# Patient Record
Sex: Female | Born: 1955
Health system: Southern US, Community
[De-identification: ages and names within clinical notes are randomized; demographics above are authoritative.]

## PROBLEM LIST (undated history)

## (undated) DIAGNOSIS — K219 Gastro-esophageal reflux disease without esophagitis: Secondary | ICD-10-CM

## (undated) DIAGNOSIS — K5792 Diverticulitis of intestine, part unspecified, without perforation or abscess without bleeding: Secondary | ICD-10-CM

## (undated) DIAGNOSIS — K760 Fatty (change of) liver, not elsewhere classified: Secondary | ICD-10-CM

## (undated) DIAGNOSIS — F419 Anxiety disorder, unspecified: Secondary | ICD-10-CM

## (undated) DIAGNOSIS — Z9889 Other specified postprocedural states: Secondary | ICD-10-CM

## (undated) DIAGNOSIS — R112 Nausea with vomiting, unspecified: Secondary | ICD-10-CM

## (undated) DIAGNOSIS — K5790 Diverticulosis of intestine, part unspecified, without perforation or abscess without bleeding: Secondary | ICD-10-CM

## (undated) DIAGNOSIS — M19042 Primary osteoarthritis, left hand: Secondary | ICD-10-CM

## (undated) DIAGNOSIS — M7060 Trochanteric bursitis, unspecified hip: Secondary | ICD-10-CM

## (undated) DIAGNOSIS — E785 Hyperlipidemia, unspecified: Secondary | ICD-10-CM

## (undated) DIAGNOSIS — G40909 Epilepsy, unspecified, not intractable, without status epilepticus: Secondary | ICD-10-CM

## (undated) DIAGNOSIS — G4733 Obstructive sleep apnea (adult) (pediatric): Principal | ICD-10-CM

## (undated) DIAGNOSIS — E669 Obesity, unspecified: Secondary | ICD-10-CM

## (undated) DIAGNOSIS — J301 Allergic rhinitis due to pollen: Secondary | ICD-10-CM

## (undated) DIAGNOSIS — M25472 Effusion, left ankle: Principal | ICD-10-CM

## (undated) DIAGNOSIS — M19041 Primary osteoarthritis, right hand: Secondary | ICD-10-CM

## (undated) DIAGNOSIS — K589 Irritable bowel syndrome without diarrhea: Secondary | ICD-10-CM

## (undated) HISTORY — DX: Gastro-esophageal reflux disease without esophagitis: K21.9

## (undated) HISTORY — PX: SHOULDER SURGERY: SHX246

## (undated) HISTORY — DX: Allergic rhinitis due to pollen: J30.1

## (undated) HISTORY — PX: BREAST ENHANCEMENT SURGERY: SHX7

## (undated) HISTORY — PX: BREAST BIOPSY: SHX20

## (undated) HISTORY — PX: CHOLECYSTECTOMY: SHX55

## (undated) HISTORY — DX: Diverticulosis of intestine, part unspecified, without perforation or abscess without bleeding: K57.90

## (undated) HISTORY — DX: Anxiety disorder, unspecified: F41.9

## (undated) HISTORY — DX: Obesity, unspecified: E66.9

## (undated) HISTORY — PX: CARPAL TUNNEL RELEASE: SHX101

## (undated) HISTORY — PX: AUGMENTATION MAMMAPLASTY: SUR837

## (undated) HISTORY — DX: Hyperlipidemia, unspecified: E78.5

## (undated) HISTORY — DX: Primary osteoarthritis, right hand: M19.041

## (undated) HISTORY — DX: Irritable bowel syndrome without diarrhea: K58.9

## (undated) HISTORY — DX: Primary osteoarthritis, left hand: M19.042

## (undated) HISTORY — PX: TONSILLECTOMY AND ADENOIDECTOMY: SUR1326

## (undated) HISTORY — DX: Epilepsy, unspecified, not intractable, without status epilepticus: G40.909

## (undated) HISTORY — DX: Obstructive sleep apnea (adult) (pediatric): G47.33

## (undated) HISTORY — DX: Diverticulitis of intestine, part unspecified, without perforation or abscess without bleeding: K57.92

## (undated) HISTORY — PX: CATARACT EXTRACTION: SUR2

## (undated) HISTORY — DX: Effusion, left ankle: M25.472

## (undated) HISTORY — DX: Fatty (change of) liver, not elsewhere classified: K76.0

## (undated) HISTORY — DX: Trochanteric bursitis, unspecified hip: M70.60

## (undated) HISTORY — PX: BACK SURGERY: SHX140

## (undated) HISTORY — PX: HAND SURGERY: SHX662

---

## 2007-05-30 ENCOUNTER — Emergency Department (HOSPITAL_COMMUNITY): Admission: EM | Admit: 2007-05-30 | Discharge: 2007-05-30 | Payer: Self-pay | Admitting: Emergency Medicine

## 2007-11-27 ENCOUNTER — Encounter: Admission: RE | Admit: 2007-11-27 | Discharge: 2007-11-27 | Payer: Self-pay | Admitting: Family Medicine

## 2009-07-29 ENCOUNTER — Encounter: Admission: RE | Admit: 2009-07-29 | Discharge: 2009-07-29 | Payer: Self-pay | Admitting: Gastroenterology

## 2009-08-18 ENCOUNTER — Ambulatory Visit (HOSPITAL_COMMUNITY): Admission: RE | Admit: 2009-08-18 | Discharge: 2009-08-18 | Payer: Self-pay | Admitting: Gastroenterology

## 2010-07-20 HISTORY — PX: CARDIAC CATHETERIZATION: SHX172

## 2010-08-02 ENCOUNTER — Encounter: Payer: Self-pay | Admitting: Family Medicine

## 2010-08-02 ENCOUNTER — Ambulatory Visit (INDEPENDENT_AMBULATORY_CARE_PROVIDER_SITE_OTHER): Payer: BC Managed Care – PPO | Admitting: Family Medicine

## 2010-08-02 DIAGNOSIS — K3184 Gastroparesis: Secondary | ICD-10-CM

## 2010-08-02 DIAGNOSIS — J309 Allergic rhinitis, unspecified: Secondary | ICD-10-CM

## 2010-08-02 DIAGNOSIS — J45909 Unspecified asthma, uncomplicated: Secondary | ICD-10-CM

## 2010-08-02 DIAGNOSIS — E785 Hyperlipidemia, unspecified: Secondary | ICD-10-CM

## 2010-08-02 MED ORDER — ALBUTEROL SULFATE HFA 108 (90 BASE) MCG/ACT IN AERS: 2.0000 | INHALATION_SPRAY | RESPIRATORY_TRACT | Status: DC | PRN

## 2010-08-02 MED ORDER — BECLOMETHASONE DIPROPIONATE 40 MCG/ACT IN AERS: 2.0000 | INHALATION_SPRAY | Freq: Two times a day (BID) | RESPIRATORY_TRACT | Status: DC

## 2010-08-02 MED ORDER — FLUTICASONE PROPIONATE 50 MCG/ACT NA SUSP: 2.0000 | Freq: Every day | NASAL | Status: DC

## 2010-08-02 NOTE — Patient Instructions (Signed)
Schedule your complete physical in 3-6 months- do not eat before this appt Start the Qvar daily as your controller medicine Use the Albuterol inhaler as your rescue medicine for when you have shortness of breath, cough, wheezing, or chest tightness Start the Flonase daily to control your nasal allergies and post nasal drip Call with any questions or concerns Welcome!  We're glad to have you!

## 2010-08-02 NOTE — Progress Notes (Signed)
  Subjective:    Patient ID: Christie Thomas, female    DOB: 08-Jan-1956, 55 y.o.   MRN: [id number]  HPI New to establish care.  Previous MD- Raquel James.  GYN- [facility])  Hyperlipidemia- chronic problem for pt, on Crestor.  Had recent clean heart cath w/ Dr Larey Brick.  Tolerating statin w/o abd pain, N/V, myalgias.  Gastroparesis- following w/ Dr Loreta Ave, had confirmatory gastic emptying study.  Intolerant of Reglan, on Domperidone 10mg  qid.  [facility]- [facility] but has not [facility].  Has been off Advair for 18 months, not using Proventil regularly.  Has had increased shortness of breath which is what prompted the cath.  Allergic rhinitis- on Allegra daily and Benadryl nightly.  No longer using Flonase.  Feels that allergy sxs are fairly well controlled.   Review of Systems For ROS see HPI     Objective:   Physical Exam  Constitutional: She is oriented to person, place, and time. She appears well-developed and well-nourished. No distress.       overwt  HENT:  Head: Normocephalic and atraumatic.       + edematous turbinates w/ mild congestion + PND No sinus tenderness  Eyes: Conjunctivae and EOM are normal. Pupils are equal, round, and reactive to light.  Neck: Normal range of motion. Neck supple. No thyromegaly present.  Cardiovascular: Normal rate, regular rhythm, normal heart sounds and intact distal pulses.   Pulmonary/Chest: Effort normal and breath sounds normal. No respiratory distress. She has no wheezes. She has no rales.  Abdominal: Soft. Bowel sounds are normal. She exhibits no distension. There is no tenderness.  Musculoskeletal: She exhibits no edema.  Lymphadenopathy:    She has no cervical adenopathy.  Neurological: She is alert and oriented to person, place, and time. No cranial nerve deficit. Coordination normal.  Skin: Skin is warm and dry.  Psychiatric: She has a normal mood and affect. Her behavior is normal.          Assessment  & Plan:

## 2010-08-10 DIAGNOSIS — E785 Hyperlipidemia, unspecified: Secondary | ICD-10-CM | POA: Insufficient documentation

## 2010-08-10 DIAGNOSIS — J45909 Unspecified asthma, uncomplicated: Secondary | ICD-10-CM | POA: Insufficient documentation

## 2010-08-10 DIAGNOSIS — K3184 Gastroparesis: Secondary | ICD-10-CM | POA: Insufficient documentation

## 2010-08-10 DIAGNOSIS — J309 Allergic rhinitis, unspecified: Secondary | ICD-10-CM | POA: Insufficient documentation

## 2010-08-10 NOTE — Assessment & Plan Note (Signed)
This is likely the cause of pt's SOB and what prompted her recent cath.  sxs are likely worse due to untreated allergies.  Start Qvar as controller med and albuterol prn for rescue.  Reviewed supportive care and red flags that should prompt return.  Pt expressed understanding and is in agreement w/ plan.

## 2010-08-10 NOTE — Assessment & Plan Note (Signed)
Start oral antihistamine daily and nasal steroid spray regularly.  Explained asthma/allergy relationship- pt now aware and understands.  Will follow.

## 2010-08-10 NOTE — Assessment & Plan Note (Signed)
On crestor daily.  Tolerating statin w/out difficulty.  Labs upcoming.  Will make adjustments to meds at that time.

## 2010-08-10 NOTE — Assessment & Plan Note (Signed)
On meds, following w/ Dr Loreta Ave.  Will follow and assist as able.

## 2010-09-13 ENCOUNTER — Other Ambulatory Visit: Payer: Self-pay | Admitting: Family Medicine

## 2010-09-13 NOTE — Telephone Encounter (Signed)
Refill sent.

## 2010-09-13 NOTE — Telephone Encounter (Signed)
Ok to refill for 3 months on both

## 2010-09-13 NOTE — Telephone Encounter (Signed)
No refills of these meds in [facility]. Please advise.

## 2011-01-11 ENCOUNTER — Ambulatory Visit (INDEPENDENT_AMBULATORY_CARE_PROVIDER_SITE_OTHER): Payer: BC Managed Care – PPO | Admitting: Family Medicine

## 2011-01-11 ENCOUNTER — Encounter: Payer: Self-pay | Admitting: Family Medicine

## 2011-01-11 VITALS — BP 118/62 | HR 81 | Temp 98.1°F | Ht 64.0 in | Wt 206.0 lb

## 2011-01-11 DIAGNOSIS — F341 Dysthymic disorder: Secondary | ICD-10-CM

## 2011-01-11 DIAGNOSIS — L509 Urticaria, unspecified: Secondary | ICD-10-CM

## 2011-01-11 DIAGNOSIS — F32A Depression, unspecified: Secondary | ICD-10-CM

## 2011-01-11 DIAGNOSIS — F419 Anxiety disorder, unspecified: Secondary | ICD-10-CM

## 2011-01-11 NOTE — Patient Instructions (Signed)
Someone will call you with your allergy appt Continue the benadryl nightly or as needed for hives Use the Epipen if needed for mouth, lip, or tongue swelling or difficulty breathing Make sure you are taking the klonopin at night to relax- you deserve it! Call with any questions or concerns I'm so sorry for your loss HANG IN THERE!!!

## 2011-01-11 NOTE — Progress Notes (Signed)
  Subjective:    Patient ID: Christie Thomas, female    DOB: 1955/12/16, 55 y.o.   MRN: [id number]  HPI Hives- reports sxs developed at 7pm on Thursday.  Hands started itching and burning- were very red.  Then developed 'red rash' at wrists, elbows, trunk.  Woke up at midnight w/ 'welts' all over her trunk.  Took benadryl overnight and sxs improved.  Had some tingling of lips but no difficulty breathing or swallowing.  Has epi-pen b/c 2 yrs ago had anaphylaxis to unknown allergen.  Takes allegra and benadryl regularly.  Had allergy skin testing done which showed no allergies despite pt's known shellfish allergy.  Depression/anxiety- pt reports brother-in-law died in 13-Aug-2022, mother died 2 weeks later.  Father died 8 weeks later.  Husband dx'd last week w/ Afib and she fears losing him too.  Is wondering whether anxiety is causing hives.  Not taking Klonopin- 'i need to be on my game w/ 3 kids'.  No longer exercising, reports she felt much better when walking around [place].  'i need my exercise back'   Review of Systems For ROS see HPI     Objective:   Physical Exam  Vitals reviewed. Constitutional: She is oriented to person, place, and time. She appears well-developed and well-nourished. No distress.  HENT:  Head: Normocephalic and atraumatic.  Neurological: She is alert and oriented to person, place, and time. No cranial nerve deficit. Coordination normal.  Skin: Skin is warm and dry. No rash noted. No erythema.  Psychiatric: She has a normal mood and affect. Her behavior is normal. Judgment and thought content normal.          Assessment & Plan:

## 2011-01-12 DIAGNOSIS — Z636 Dependent relative needing care at home: Secondary | ICD-10-CM | POA: Insufficient documentation

## 2011-01-12 DIAGNOSIS — F329 Major depressive disorder, single episode, unspecified: Secondary | ICD-10-CM | POA: Insufficient documentation

## 2011-01-12 NOTE — Assessment & Plan Note (Signed)
No clear etiology at this point.  Has hx of anaphylaxis w/out known trigger.  May be stress related.  Will refer to allergist for testing.  Reviewed supportive care and red flags that should prompt return.  Pt expressed understanding and is in agreement w/ plan.

## 2011-01-12 NOTE — Assessment & Plan Note (Signed)
New for pt.  Likely situational w/ all the loss she experienced this summer.  May be cause of her hives.  Encouraged her to take the Klonopin as needed at night to allow her to relax and sleep.  Not interested in daily med at this time.  Will follow closely.  Total time spent w/ pt, 33 minutes, > 50% spent counseling.

## 2011-01-28 ENCOUNTER — Other Ambulatory Visit: Payer: Self-pay | Admitting: Family Medicine

## 2011-01-28 NOTE — Telephone Encounter (Signed)
rx sent to pharmacy by e-script For crestor

## 2011-02-25 ENCOUNTER — Emergency Department (HOSPITAL_COMMUNITY): Payer: BC Managed Care – PPO

## 2011-02-25 ENCOUNTER — Emergency Department (HOSPITAL_COMMUNITY)
Admission: EM | Admit: 2011-02-25 | Discharge: 2011-02-25 | Disposition: A | Discharge status: Home / Self Care | Payer: BC Managed Care – PPO | Attending: Emergency Medicine | Admitting: Emergency Medicine

## 2011-02-25 ENCOUNTER — Encounter (HOSPITAL_COMMUNITY): Payer: Self-pay

## 2011-02-25 DIAGNOSIS — J069 Acute upper respiratory infection, unspecified: Secondary | ICD-10-CM | POA: Insufficient documentation

## 2011-02-25 DIAGNOSIS — R05 Cough: Secondary | ICD-10-CM | POA: Insufficient documentation

## 2011-02-25 DIAGNOSIS — R059 Cough, unspecified: Secondary | ICD-10-CM | POA: Insufficient documentation

## 2011-02-25 DIAGNOSIS — R079 Chest pain, unspecified: Secondary | ICD-10-CM | POA: Insufficient documentation

## 2011-02-25 LAB — COMPREHENSIVE METABOLIC PANEL
ALT: 116 U/L — ABNORMAL HIGH (ref 0–35)
AST: 156 U/L — ABNORMAL HIGH (ref 0–37)
Albumin: 4 g/dL (ref 3.5–5.2)
Alkaline Phosphatase: 194 U/L — ABNORMAL HIGH (ref 39–117)
BUN: 13 mg/dL (ref 6–23)
CO2: 24 mEq/L (ref 19–32)
Calcium: 9.3 mg/dL (ref 8.4–10.5)
Chloride: 103 mEq/L (ref 96–112)
Creatinine, Ser: 0.88 mg/dL (ref 0.50–1.10)
GFR calc Af Amer: 84 mL/min — ABNORMAL LOW (ref >90)
GFR calc non Af Amer: 72 mL/min — ABNORMAL LOW (ref >90)
Glucose, Bld: 119 mg/dL — ABNORMAL HIGH (ref 70–99)
Potassium: 3.7 mEq/L (ref 3.5–5.1)
Sodium: 137 mEq/L (ref 135–145)
Total Bilirubin: 0.8 mg/dL (ref 0.3–1.2)
Total Protein: 7.8 g/dL (ref 6.0–8.3)

## 2011-02-25 LAB — CBC
HCT: 38.7 % (ref 36.0–46.0)
Hemoglobin: 13.1 g/dL (ref 12.0–15.0)
MCH: 27.1 pg (ref 26.0–34.0)
MCHC: 33.9 g/dL (ref 30.0–36.0)
MCV: 80 fL (ref 78.0–100.0)
Platelets: 267 10*3/uL (ref 150–400)
RBC: 4.84 MIL/uL (ref 3.87–5.11)
RDW: 13.7 % (ref 11.5–15.5)
WBC: 13.2 10*3/uL — ABNORMAL HIGH (ref 4.0–10.5)

## 2011-02-25 LAB — URINALYSIS, ROUTINE W REFLEX MICROSCOPIC
Bilirubin Urine: NEGATIVE
Glucose, UA: NEGATIVE mg/dL
Ketones, ur: NEGATIVE mg/dL
Leukocytes, UA: NEGATIVE
Nitrite: NEGATIVE
Protein, ur: NEGATIVE mg/dL
Specific Gravity, Urine: 1.013 (ref 1.005–1.030)
Urobilinogen, UA: 1 mg/dL (ref 0.0–1.0)
pH: 5.5 (ref 5.0–8.0)

## 2011-02-25 LAB — DIFFERENTIAL
Basophils Absolute: 0 10*3/uL (ref 0.0–0.1)
Basophils Relative: 0 % (ref 0–1)
Eosinophils Absolute: 0.1 10*3/uL (ref 0.0–0.7)
Eosinophils Relative: 0 % (ref 0–5)
Lymphocytes Relative: 16 % (ref 12–46)
Lymphs Abs: 2.1 10*3/uL (ref 0.7–4.0)
Monocytes Absolute: 0.9 10*3/uL (ref 0.1–1.0)
Monocytes Relative: 7 % (ref 3–12)
Neutro Abs: 10.2 10*3/uL — ABNORMAL HIGH (ref 1.7–7.7)
Neutrophils Relative %: 77 % (ref 43–77)

## 2011-02-25 LAB — LIPASE, BLOOD: Lipase: 29 U/L (ref 11–59)

## 2011-02-25 LAB — URINE MICROSCOPIC-ADD ON

## 2011-02-25 MED ORDER — KETOROLAC TROMETHAMINE 30 MG/ML IJ SOLN
30.0000 mg | Freq: Once | INTRAMUSCULAR | Status: AC
  Administered 2011-02-25: 30 mg via INTRAVENOUS
  Filled 2011-02-25: qty 1

## 2011-02-25 MED ORDER — SODIUM CHLORIDE 0.9 % IV BOLUS (SEPSIS)
1000.0000 mL | Freq: Once | INTRAVENOUS | Status: AC
  Administered 2011-02-25: 1000 mL via INTRAVENOUS

## 2011-02-25 MED ORDER — GUAIFENESIN ER 1200 MG PO TB12: 1.0000 | ORAL_TABLET | Freq: Two times a day (BID) | ORAL | Status: DC

## 2011-02-25 MED ORDER — ONDANSETRON HCL 4 MG/2ML IJ SOLN
INTRAMUSCULAR | Status: AC
  Administered 2011-02-25: 05:00:00
  Filled 2011-02-25: qty 2

## 2011-02-25 MED ORDER — ONDANSETRON HCL 4 MG/2ML IJ SOLN: 4.0000 mg | Freq: Once | INTRAMUSCULAR | Status: DC

## 2011-02-25 MED ORDER — PROMETHAZINE-DM 6.25-15 MG/5ML PO SYRP: 5.0000 mL | ORAL_SOLUTION | ORAL | Status: AC | PRN

## 2011-02-25 MED ORDER — ACETAMINOPHEN-CODEINE 120-12 MG/5ML PO SOLN: 10.0000 mL | ORAL | Status: DC | PRN

## 2011-02-25 MED ORDER — IBUPROFEN 800 MG PO TABS: 800.0000 mg | ORAL_TABLET | Freq: Three times a day (TID) | ORAL | Status: AC | PRN

## 2011-02-25 NOTE — ED Notes (Signed)
Pt given discharge teaching, stated understanding, amb indep to discharge window.

## 2011-02-25 NOTE — ED Notes (Signed)
Pt with c/o right sided pain under rib cage since 3 am. Pt reports n/v, denies diarrhea. Pt described the pain as stabbing starting in her back and radiating under right rib cage. Pt states she was unable to get comfortable.

## 2011-02-25 NOTE — ED Notes (Signed)
Per EMS, pt with c/o flu-like symptoms and acute back pain radiating to right flank and epigastric regions. Pt c/o n/v. Pt afebrile at this time. Pt has been been taking OTC cough syrup and aspirin.

## 2011-02-25 NOTE — ED Provider Notes (Signed)
Medical screening examination/treatment/procedure(s) were performed by non-physician practitioner and as supervising physician I was immediately available for consultation/collaboration.  Keisuke Hollabaugh K Ethylene Reznick-Rasch, MD 02/25/11 2356 

## 2011-02-25 NOTE — ED Provider Notes (Signed)
History     CSN: [id number] Arrival date & time: 02/25/2011  5:05 AM   First MD Initiated Contact with Patient 02/25/11 0559      Chief Complaint  Patient presents with  . Influenza  . Abdominal Pain    (Consider location/radiation/quality/duration/timing/severity/associated sxs/prior treatment) HPI Patient reports the emergency department with a two-day history of cough runny nose sore throat fever with some nausea.  She states that 3 AM she awoke with right back pain and lateral rib pain.  She states at that time she vomited x2 and then her epigastrium began to hurt.  The patient is not having any chest pain or shortness of breath.  She denies diarrhea or difficulty swallowing.  Past Medical History  Diagnosis Date  . Asthma   . GERD (gastroesophageal reflux disease)   . Hay fever   . Hyperlipidemia   . Epilepsy     Past Surgical History  Procedure Date  . Cholecystectomy   . Breast biopsy     fatty tumor  . Tonsillectomy and adenoidectomy   . Back surgery     x2  . Shoulder surgery     right  . Cesarean section   . Breast enhancement surgery   . Cardiac catheterization 07/2010    Family History  Problem Relation Age of Onset  . Stroke Mother   . Sudden death Paternal Grandfather 55    History  Substance Use Topics  . Smoking status: Never Smoker   . Smokeless tobacco: Not on file  . Alcohol Use: No    OB History    Grav Para Term Preterm Abortions TAB SAB Ect Mult Living                  Review of Systems All pertinent positives and negatives in the history of present illness  Allergies  Other  Home Medications   Current Outpatient Rx  Name Route Sig Dispense Refill  . ALBUTEROL SULFATE HFA 108 (90 BASE) MCG/ACT IN AERS Inhalation Inhale 2 puffs into the lungs every 4 (four) hours as needed for wheezing or shortness of breath. 1 Inhaler 6  . AMBULATORY NON FORMULARY MEDICATION  Ultra flora IB -- once daily     . BECLOMETHASONE DIPROPIONATE 40  MCG/ACT IN AERS Inhalation Inhale 2 puffs into the lungs 2 (two) times daily. 1 Inhaler 12  . CLONAZEPAM 0.5 MG PO TABS Oral Take 0.5 mg by mouth 3 (three) times daily as needed. For anxiety.     . CRESTOR 10 MG PO TABS  take 1 tablet by mouth once daily 30 tablet 3  . DEXLANSOPRAZOLE 60 MG PO CPDR Oral Take 60 mg by mouth daily.      Marland Kitchen HYDROXYZINE HCL 10 MG PO TABS Oral Take 10 mg by mouth 4 (four) times daily as needed. For allergy symptoms.       BP 112/64  Pulse 58  Temp(Src) 98.4 F (36.9 C) (Oral)  SpO2 98%  Physical Exam  Constitutional: She appears well-developed and well-nourished. No distress.  HENT:  Head: Normocephalic and atraumatic.  Eyes: Pupils are equal, round, and reactive to light. Right eye exhibits no discharge. Left eye exhibits no discharge.  Cardiovascular: Normal rate and regular rhythm.  Exam reveals no gallop and no friction rub.   No murmur heard. Pulmonary/Chest: Effort normal and breath sounds normal. No respiratory distress. She has no rales. She exhibits tenderness.  Musculoskeletal:       Back:  Skin: Skin is warm  and dry. No rash noted.    ED Course  Procedures (including critical care time)  Labs Reviewed  CBC - Abnormal; Notable for the following:    WBC 13.2 (*)    All other components within normal limits  DIFFERENTIAL - Abnormal; Notable for the following:    Neutro Abs 10.2 (*)    All other components within normal limits  COMPREHENSIVE METABOLIC PANEL - Abnormal; Notable for the following:    Glucose, Bld 119 (*)    AST 156 (*)    ALT 116 (*)    Alkaline Phosphatase 194 (*)    GFR calc non Af Amer 72 (*)    GFR calc Af Amer 84 (*)    All other components within normal limits  URINALYSIS, ROUTINE W REFLEX MICROSCOPIC - Abnormal; Notable for the following:    APPearance CLOUDY (*)    Hgb urine dipstick TRACE (*)    All other components within normal limits  URINE MICROSCOPIC-ADD ON - Abnormal; Notable for the following:     Bacteria, UA MANY (*)    Casts HYALINE CASTS (*)    All other components within normal limits  LIPASE, BLOOD   Dg Chest 2 View  02/25/2011  *RADIOLOGY REPORT*  Clinical Data: Cough and fever and chest pain  CHEST - 2 VIEW  Comparison: None.  Findings: Normal heart size.  Normal vascularity.  Negative for pneumonia or effusion.  Lungs are clear  IMPRESSION: No active cardiopulmonary disease.  Original Report Authenticated By: Camelia Phenes, M.D.     Patient has what appears to be an upper respiratory viral type illness area that has caused her to have cough runny nose sore throat nausea vomiting.  This pain appears to be related to to this illness.     MDM  Viral illness with chest wall pain.  Could be cough related and the pain is palpable on exam.  Patient is perc negative and seems less likely this is a pulmonary embolus-type scenario.  Patient's pain is improved with medication.  Her vital signs remained stable and in normal range.  Spoke with Dr. Loreta Ave of GI has a good friend of the patient and GI Dr. and advised her of the patient's condition with the patient's permission.         Carlyle Dolly, PA-C 02/25/11 0901  Carlyle Dolly, PA-C 02/25/11 [phone]

## 2011-02-25 NOTE — ED Notes (Signed)
[id number]<JY> Expected date:<BR> Expected time:<BR> Means of arrival:<BR> Comments:<BR> EMS/Flu-like symptoms/Abd pain

## 2011-02-25 NOTE — ED Notes (Signed)
Per MD - Hold discharge until call back from PCP.

## 2011-02-27 LAB — HM PAP SMEAR: HM Pap smear: NORMAL

## 2011-03-02 ENCOUNTER — Encounter: Payer: Self-pay | Admitting: Family Medicine

## 2011-03-02 ENCOUNTER — Ambulatory Visit (INDEPENDENT_AMBULATORY_CARE_PROVIDER_SITE_OTHER): Payer: BC Managed Care – PPO | Admitting: Family Medicine

## 2011-03-02 VITALS — BP 115/75 | HR 84 | Temp 97.8°F | Ht 64.0 in | Wt 206.8 lb

## 2011-03-02 DIAGNOSIS — H6690 Otitis media, unspecified, unspecified ear: Secondary | ICD-10-CM

## 2011-03-02 DIAGNOSIS — R6889 Other general symptoms and signs: Secondary | ICD-10-CM

## 2011-03-02 MED ORDER — AMOXICILLIN 875 MG PO TABS: 875.0000 mg | ORAL_TABLET | Freq: Two times a day (BID) | ORAL | Status: AC

## 2011-03-02 MED ORDER — GUAIFENESIN-CODEINE 100-10 MG/5ML PO SYRP: 10.0000 mL | ORAL_SOLUTION | Freq: Three times a day (TID) | ORAL | Status: AC | PRN

## 2011-03-02 MED ORDER — BENZONATATE 200 MG PO CAPS: 200.0000 mg | ORAL_CAPSULE | Freq: Three times a day (TID) | ORAL | Status: AC | PRN

## 2011-03-02 NOTE — Progress Notes (Signed)
  Subjective:    Patient ID: Christie Thomas, female    DOB: 05-13-1955, 55 y.o.   MRN: [id number]  HPI URI- 'caught the flu last week', + body aches, chills, pains.  Went to ER on Thursday b/c of sharp pain in back, 'thought she collapsed a lung or something' (per husband).  Was vomiting.  Was given phenergan, had CXR- dx'd w/ viral URI.  Cough and chest congestion continues- cough is productive of green sputum.  Having post-tussive emesis.  Denies facial pain/pressure.  + ear pain.  + sick contacts.  Taking Mucinex, phenergan cough syrup.   Review of Systems For ROS see HPI     Objective:   Physical Exam  Constitutional: She appears well-developed and well-nourished. No distress.  HENT:  Head: Normocephalic and atraumatic.       L TM normal R OM Mild nasal congestion Throat w/out erythema, edema, or exudate  Eyes: Conjunctivae and EOM are normal. Pupils are equal, round, and reactive to light.  Neck: Normal range of motion. Neck supple.  Cardiovascular: Normal rate, regular rhythm, normal heart sounds and intact distal pulses.   No murmur heard. Pulmonary/Chest: Effort normal and breath sounds normal. No respiratory distress. She has no wheezes.       + hacking cough  Lymphadenopathy:    She has no cervical adenopathy.          Assessment & Plan:

## 2011-03-02 NOTE — Patient Instructions (Signed)
You have a R ear infection Take the Amoxicillin as directed- take w/ food Use the cough syrup as needed- will make you sleepy Continue the Mucinex as needed Add Tessalon as needed for cough (can mix w/ syrup) REST! Have a safe trip and Happy Holidays!

## 2011-03-03 LAB — POCT INFLUENZA A/B: Influenza A, POC: NEGATIVE

## 2011-03-08 DIAGNOSIS — H6690 Otitis media, unspecified, unspecified ear: Secondary | ICD-10-CM | POA: Insufficient documentation

## 2011-03-08 NOTE — Assessment & Plan Note (Signed)
R OM.  Start abx.  Reviewed supportive care and red flags that should prompt return.  Pt expressed understanding and is in agreement w/ plan.

## 2011-06-10 ENCOUNTER — Other Ambulatory Visit: Payer: Self-pay | Admitting: Family Medicine

## 2011-06-10 MED ORDER — CLONAZEPAM 0.5 MG PO TABS: 0.5000 mg | ORAL_TABLET | Freq: Three times a day (TID) | ORAL | Status: DC | PRN

## 2011-06-10 NOTE — Telephone Encounter (Signed)
Noted medication is no longer on medication list per noted  pt is no longer taking the medication, called pt to clarify if the pt does still take this medication, left vm for pt to call office to clarify.

## 2011-06-10 NOTE — Telephone Encounter (Signed)
Refill for  Clonazepam 0.5 MG Tablet Take 1-tablet by mouth 3-times daily as needed  Last written 6.25.12 Last qty written 6.25.12

## 2011-06-10 NOTE — Telephone Encounter (Signed)
Last filled 09-13-10 #90 3, last OV 03-02-11, Pt return call stating that she is still taking this med but only uses it as needed so she does not refill it as often. Marland KitchenPlease advise

## 2011-06-10 NOTE — Telephone Encounter (Signed)
Refill x1 

## 2011-07-08 ENCOUNTER — Telehealth: Payer: Self-pay | Admitting: Family Medicine

## 2011-07-08 MED ORDER — ROSUVASTATIN CALCIUM 10 MG PO TABS: 10.0000 mg | ORAL_TABLET | Freq: Every day | ORAL | Status: DC

## 2011-07-08 NOTE — Telephone Encounter (Signed)
Refill: Crestor 10 mg tablet # 30. Take 1 tablet by mouth once daily. Last fill 06-12-11

## 2011-07-08 NOTE — Telephone Encounter (Signed)
Refill done.  

## 2011-10-17 ENCOUNTER — Other Ambulatory Visit: Payer: Self-pay | Admitting: Family Medicine

## 2011-10-17 NOTE — Telephone Encounter (Signed)
Last OV 03-02-11, last refill 06-10-11 #30 no refills

## 2011-11-10 ENCOUNTER — Other Ambulatory Visit: Payer: Self-pay | Admitting: Family Medicine

## 2011-11-10 NOTE — Telephone Encounter (Signed)
Refill CRESTOR 10 MG Take 1 tablet (10 mg total) by mouth daily. # 30 wt/3-refills last fill 7.22.13, last ov 12.12.12 acute visit Last labs DONE at [facility] - scanned 2.21.13

## 2011-11-11 MED ORDER — ROSUVASTATIN CALCIUM 10 MG PO TABS: 10.0000 mg | ORAL_TABLET | Freq: Every day | ORAL | Status: DC

## 2011-11-11 NOTE — Telephone Encounter (Signed)
rx sent to pharmacy by e-script per verbal from MD Tabori ok to send and call pt to remind she is overdue CPE, she notes she will call back in a few weeks to schedule per leaving for vacation tomorrow, MD Beverely Low made aware verbally

## 2011-11-28 ENCOUNTER — Encounter: Payer: Self-pay | Admitting: Family Medicine

## 2011-11-28 ENCOUNTER — Ambulatory Visit (INDEPENDENT_AMBULATORY_CARE_PROVIDER_SITE_OTHER): Payer: BC Managed Care – PPO | Admitting: Family Medicine

## 2011-11-28 VITALS — BP 118/77 | HR 88 | Temp 99.2°F | Ht 64.75 in | Wt 202.8 lb

## 2011-11-28 DIAGNOSIS — J309 Allergic rhinitis, unspecified: Secondary | ICD-10-CM

## 2011-11-28 DIAGNOSIS — E785 Hyperlipidemia, unspecified: Secondary | ICD-10-CM

## 2011-11-28 LAB — HEPATIC FUNCTION PANEL
Bilirubin, Direct: 0 mg/dL (ref 0.0–0.3)
Total Bilirubin: 0.3 mg/dL (ref 0.3–1.2)
Total Protein: 8 g/dL (ref 6.0–8.3)

## 2011-11-28 LAB — LIPID PANEL
HDL: 47.4 mg/dL (ref >39.00)
Triglycerides: 343 mg/dL — ABNORMAL HIGH (ref 0.0–149.0)

## 2011-11-28 NOTE — Patient Instructions (Addendum)
Schedule your complete physical in 6 months We'll notify you of your lab results and make any changes if needed Restart Claritin or Zyrtec daily for the post nasal drip Add the Nasonex- 2 sprays each nostril Drink plenty of fluids Call with any questions or concerns Happy Fall!!!

## 2011-11-28 NOTE — Assessment & Plan Note (Signed)
Chronic problem.  Tolerating meds w/out difficulty.  Check labs.  Adjust meds prn  

## 2011-11-28 NOTE — Assessment & Plan Note (Signed)
Deteriorated.  Encouraged pt to resume daily antihistamine and nasal steroid spray.  Reviewed supportive care and red flags that should prompt return.  Pt expressed understanding and is in agreement w/ plan.

## 2011-11-28 NOTE — Progress Notes (Signed)
  Subjective:    Patient ID: Christie Thomas, female    DOB: 05-03-1955, 56 y.o.   MRN: [id number]  HPI Hyperlipidemia- chronic problem, taking Crestor daily.  Denies abd pain, N/V, myalgias.  Due for labs.  P[place]- [place]  + dry cough.  Took mucinex for a few days but this made her 'jittery'.  Only taking hydroxyzine.  Not currently using Flonase.   Review of Systems For ROS see HPI     Objective:   Physical Exam  Vitals reviewed. Constitutional: She appears well-developed and well-nourished. No distress.  HENT:  Head: Normocephalic and atraumatic.  Right Ear: Tympanic membrane normal.  Left Ear: Tympanic membrane normal.  Nose: Mucosal edema and rhinorrhea present. Right sinus exhibits no maxillary sinus tenderness and no frontal sinus tenderness. Left sinus exhibits no maxillary sinus tenderness and no frontal sinus tenderness.  Mouth/Throat: Mucous membranes are normal. Posterior oropharyngeal erythema (w/ PND) present.  Eyes: Conjunctivae and EOM are normal. Pupils are equal, round, and reactive to light.  Neck: Normal range of motion. Neck supple.  Cardiovascular: Normal rate, regular rhythm and normal heart sounds.   Pulmonary/Chest: Effort normal and breath sounds normal. No respiratory distress. She has no wheezes. She has no rales.  Lymphadenopathy:    She has no cervical adenopathy.          Assessment & Plan:

## 2011-11-29 ENCOUNTER — Telehealth: Payer: Self-pay | Admitting: Family Medicine

## 2011-11-29 LAB — LDL CHOLESTEROL, DIRECT: Direct LDL: 81.4 mg/dL

## 2011-11-29 MED ORDER — FLUTICASONE PROPIONATE 50 MCG/ACT NA SUSP: 2.0000 | Freq: Every day | NASAL | Status: DC

## 2011-11-29 NOTE — Telephone Encounter (Signed)
rx sent to pharmacy by e-script  

## 2011-11-29 NOTE — Telephone Encounter (Signed)
Refill: Fluticasone prop spray. Instill 2 sprays into each nostril once. Qty 16. Last fill 03-02-11

## 2011-12-01 MED ORDER — FENOFIBRATE 160 MG PO TABS: 160.0000 mg | ORAL_TABLET | Freq: Every day | ORAL | Status: DC

## 2011-12-01 NOTE — Addendum Note (Signed)
Addended by: Derry Lory A on: 12/01/2011 05:37 PM   Modules accepted: Orders

## 2011-12-12 ENCOUNTER — Telehealth: Payer: Self-pay | Admitting: Family Medicine

## 2011-12-12 MED ORDER — HYDROXYZINE HCL 10 MG PO TABS: 10.0000 mg | ORAL_TABLET | Freq: Four times a day (QID) | ORAL | Status: DC | PRN

## 2011-12-12 NOTE — Telephone Encounter (Signed)
Refill: Hydroxyzine hcl 10mg  tablet. Take 1 tablet by mouth three times a day and 1 at bedtime. Qty 120. Last fill 7.29.13

## 2012-02-17 ENCOUNTER — Telehealth: Payer: Self-pay | Admitting: Family Medicine

## 2012-02-17 ENCOUNTER — Other Ambulatory Visit: Payer: Self-pay | Admitting: Family Medicine

## 2012-02-17 NOTE — Telephone Encounter (Signed)
Discussed with pt's husband. He states that he pt did not go to the hospital & she is just having wrist pain. Scheduled pt to see Dr. Beverely Low 12.3.13 @ 1pm.

## 2012-02-17 NOTE — Telephone Encounter (Signed)
If I am understanding correctly, she got advise from call-a-nurse, please check of the patient, if she feels worse, having headache, chest pain, abdominal pain -- needs to go to a urgent care, if she's not gradually improving needs to see PCP next week.
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
## 2012-02-17 NOTE — Telephone Encounter (Signed)
Please advise 

## 2012-02-17 NOTE — Telephone Encounter (Signed)
Last seen 11/28/11 and filled 10/17/11 # 30. Please advise     KP

## 2012-02-20 ENCOUNTER — Ambulatory Visit: Payer: Self-pay | Admitting: Internal Medicine

## 2012-02-21 ENCOUNTER — Ambulatory Visit (INDEPENDENT_AMBULATORY_CARE_PROVIDER_SITE_OTHER): Payer: BC Managed Care – PPO | Admitting: Family Medicine

## 2012-02-21 VITALS — BP 100/62 | HR 77 | Temp 98.2°F | Wt 205.0 lb

## 2012-02-21 DIAGNOSIS — Z23 Encounter for immunization: Secondary | ICD-10-CM

## 2012-02-21 DIAGNOSIS — S2000XA Contusion of breast, unspecified breast, initial encounter: Secondary | ICD-10-CM

## 2012-02-21 NOTE — Patient Instructions (Addendum)
I'm glad you are feeling better Continue to ice as needed Ibuprofen as needed The implant seems intact Call with any questions or concerns Happy belated birthday and happy holidays!!!
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
## 2012-03-06 NOTE — Assessment & Plan Note (Signed)
New.  No evidence of ruptured or leaking implant.  Reviewed supportive care and red flags that should prompt return.  Pt expressed understanding and is in agreement w/ plan.

## 2012-03-06 NOTE — Assessment & Plan Note (Signed)
New.  No LOC, did not go to ER.  Having some residual soreness and bruising but is otherwise 'ok'.  No current sxs of PTSD, has resumed driving.  Pt to call if having problems.

## 2012-03-15 ENCOUNTER — Other Ambulatory Visit: Payer: Self-pay | Admitting: Family Medicine

## 2012-03-15 NOTE — Telephone Encounter (Signed)
crestor 10mg  tablet Qty:30 Take 1 tablet by mouth once daily  Paperwork states to please provide phone number for prior auth [phone]

## 2012-03-23 NOTE — Telephone Encounter (Signed)
Call for [phone] opt 3 than opt 1 or go online a [employer].

## 2012-04-02 NOTE — Telephone Encounter (Signed)
Prior Auth approved 03-23-12-12-17-14, approval letter scan to chart.

## 2012-05-24 ENCOUNTER — Telehealth: Payer: Self-pay | Admitting: Family Medicine

## 2012-05-24 MED ORDER — ROSUVASTATIN CALCIUM 10 MG PO TABS: 10.0000 mg | ORAL_TABLET | Freq: Every day | ORAL | Status: DC

## 2012-05-24 NOTE — Telephone Encounter (Signed)
Refill done.  

## 2012-05-24 NOTE — Telephone Encounter (Signed)
Refill: Crestor 10 mg tablet. Take 1 tablet by mouth once daily. Qty 30. Last fill 04-28-12

## 2012-05-28 ENCOUNTER — Encounter: Payer: Self-pay | Admitting: *Deleted

## 2012-05-29 ENCOUNTER — Ambulatory Visit (INDEPENDENT_AMBULATORY_CARE_PROVIDER_SITE_OTHER): Payer: BC Managed Care – PPO | Admitting: Family Medicine

## 2012-05-29 ENCOUNTER — Ambulatory Visit (HOSPITAL_BASED_OUTPATIENT_CLINIC_OR_DEPARTMENT_OTHER)
Admission: RE | Admit: 2012-05-29 | Discharge: 2012-05-29 | Disposition: A | Discharge status: Home / Self Care | Payer: BC Managed Care – PPO | Source: Ambulatory Visit | Attending: Family Medicine | Admitting: Family Medicine

## 2012-05-29 ENCOUNTER — Encounter: Payer: Self-pay | Admitting: Family Medicine

## 2012-05-29 VITALS — BP 125/75 | HR 67 | Ht 65.0 in | Wt 204.0 lb

## 2012-05-29 VITALS — BP 130/70 | HR 70 | Temp 98.5°F | Ht 64.5 in | Wt 208.2 lb

## 2012-05-29 DIAGNOSIS — M25539 Pain in unspecified wrist: Secondary | ICD-10-CM

## 2012-05-29 DIAGNOSIS — M25531 Pain in right wrist: Secondary | ICD-10-CM

## 2012-05-29 DIAGNOSIS — Z0000 Encounter for general adult medical examination without abnormal findings: Secondary | ICD-10-CM | POA: Insufficient documentation

## 2012-05-29 NOTE — Assessment & Plan Note (Signed)
New.  Given TTP over snuff box and mechanism of injury- FOOSH- will immediately refer to sports med for evaluation.  Pt in need of thumb spica- none available in office.  Pt given wrist brace for comfort.  Will follow closely.

## 2012-05-29 NOTE — Patient Instructions (Addendum)
[facility] Appt w/ Dr Pearletha Forge Please send me copies of your lab results Keep up the good work! Hang in there!

## 2012-05-29 NOTE — Patient Instructions (Addendum)
I'm concerned you have nondisplaced scaphoid fracture of your wrist. These do not always show up on the first x-rays and we start by treating them conservatively. Wear thumb spica brace or cast for next 2 weeks. Elevate above the level of your heart as much as possible. Ibuprofen 3 tabs three times a day OR aleve 2 tabs twice a day with food. Consider norco or percocet as needed for severe pain but no driving on this. Follow up with me in 2 weeks to repeat your exam and x-rays. If still having pain and no findings on imaging, we would then move forward with MRI.

## 2012-05-29 NOTE — Progress Notes (Signed)
Subjective:    Patient ID: Christie Thomas, female    DOB: [birth year], 57 y.o.   MRN: [id number]  PCP: Dr. Beverely Low  HPI 57 yo F here for right wrist pain.  Patient reports on 3/8 she was cleaning a chandelier when she fell off a step and sustained a FOOSH type injury to right wrist. Had some mild pain after this but felt like she could do everything she needed to. Soreness worsened but was not very severe until 3/10 at night - severe pain base of right thumb, difficulty bending thumb and fingers. Some numbness into digits. + swelling but no obvious bruising. Has had a cyst removed from this wrist and carpal tunnel release. Is right handed. Iced all night. Recently given a wrist brace.  Past Medical History  Diagnosis Date  . Asthma   . GERD (gastroesophageal reflux disease)   . Hay fever   . Hyperlipidemia   . Epilepsy     Current Outpatient Prescriptions on File Prior to Visit  Medication Sig Dispense Refill  . albuterol (PROVENTIL HFA;VENTOLIN HFA) 108 (90 BASE) MCG/ACT inhaler Inhale 2 puffs into the lungs every 4 (four) hours as needed.      . AMBULATORY NON FORMULARY MEDICATION Ultra flora IB -- once daily       . beclomethasone (QVAR) 40 MCG/ACT inhaler Inhale 2 puffs into the lungs 2 (two) times daily.      . clonazePAM (KLONOPIN) 0.5 MG tablet TAKE 1 TABLET BY MOUTH THREE TIMES DAILY AS NEEDED FOR ANXIETY  60 tablet  0  . dexlansoprazole (DEXILANT) 60 MG capsule Take 60 mg by mouth daily.        . fenofibrate 160 MG tablet Take 1 tablet (160 mg total) by mouth daily.  30 tablet  6  . fluticasone (FLONASE) 50 MCG/ACT nasal spray Place 2 sprays into the nose daily.  16 g  3  . hydrOXYzine (ATARAX/VISTARIL) 10 MG tablet Take 1 tablet (10 mg total) by mouth 4 (four) times daily as needed. For allergy symptoms.  30 tablet  1  . rosuvastatin (CRESTOR) 10 MG tablet Take 1 tablet (10 mg total) by mouth daily.  30 tablet  0   No current facility-administered medications on  file prior to visit.    Past Surgical History  Procedure Laterality Date  . Cholecystectomy    . Breast biopsy      fatty tumor  . Tonsillectomy and adenoidectomy    . Back surgery      x2  . Shoulder surgery      right  . Cesarean section    . Breast enhancement surgery    . Cardiac catheterization  07/2010  . Hand surgery      carpal tunnel release, cyst excision    Allergies  Allergen Reactions  . Penicillins     Throat swelling   . Other     Shell fish    History   Social History  . Marital Status: Married    Spouse Name: N/A    Number of Children: N/A  . Years of Education: N/A   Occupational History  . Not on file.   Social History Main Topics  . Smoking status: Never Smoker   . Smokeless tobacco: Not on file  . Alcohol Use: No  . Drug Use: No  . Sexually Active: Not on file   Other Topics Concern  . Not on file   Social History Narrative  . No narrative on file  Family History  Problem Relation Age of Onset  . Stroke Mother   . Hypertension Mother   . Hyperlipidemia Mother   . Diabetes Mother   . Sudden death Paternal Grandfather 2  . Heart attack Neg Hx     BP 125/75  Pulse 67  Ht 5\' 5"  (1.651 m)  Wt 204 lb (92.534 kg)  BMI 33.95 kg/m2  Review of Systems See HPI above.    Objective:   Physical Exam Gen: NAD  R wrist: Mild swelling radial aspect of wrist.  No other deformity, bruising.  No erythema, warmth. TTP greatest at base of 1st metacarpal, snuffbox.  Less TTP 2nd metacarpal, lunate areas.  No other TTP wrist, elbow, hand. Able to extend and abduct fingers but with decreased strength - same for thumb opposition. Sensation felt in digits Cap refill < 2 sec.    Assessment & Plan:  1. Right wrist pain - radiographs negative.  Location of pain concerning for occult scaphoid fracture.  Is unusual that her pain didn't become intense until past couple nights when injury was on the 8th.  Advised we treat this conservatively  with thumb spica brace to wear as she would a cast.  Elevation.  Declined stronger pain medication - going to take tylenol, nsaids as needed.  F/u in 2 weeks for reevaluation, possible repeat radiographs.  Discussed Vitamin C daily to decrease risk of RSD.

## 2012-05-29 NOTE — Progress Notes (Signed)
  Subjective:    Patient ID: Christie Thomas, female    DOB: [birth year], 57 y.o.   MRN: [id number]  HPI CPE- UTD on GYN (pap, mammo).  UTD on colonoscopy.  UTD on labs (03/02/12)  Wrist pain- R wrist, fell on Saturday.  No pain until last night.  Started as a throbbing, burning but now unable to move wrist.  Fingers are swollen, painful to make fist.  Pain is along radial aspect.  Pain w/ both flexion and extension.  Most painful over thumb and snuffbox.   Review of Systems Patient reports no vision/ hearing changes, adenopathy,fever, weight change,  persistant/recurrent hoarseness , swallowing issues, chest pain, palpitations, edema, persistant/recurrent cough, hemoptysis, dyspnea (rest/exertional/paroxysmal nocturnal), gastrointestinal bleeding (melena, rectal bleeding), abdominal pain, significant heartburn, bowel changes, GU symptoms (dysuria, hematuria, incontinence), Gyn symptoms (abnormal  bleeding, pain),  syncope, focal weakness, memory loss, numbness & tingling, skin/hair/nail changes, abnormal bruising or bleeding, anxiety, or depression.     Objective:   Physical Exam General Appearance:    Alert, cooperative, no distress, appears stated age  Head:    Normocephalic, without obvious abnormality, atraumatic  Eyes:    PERRL, conjunctiva/corneas clear, EOM's intact, fundi    benign, both eyes  Ears:    Normal TM's and external ear canals, both ears  Nose:   Nares normal, septum midline, mucosa normal, no drainage    or sinus tenderness  Throat:   Lips, mucosa, and tongue normal; teeth and gums normal  Neck:   Supple, symmetrical, trachea midline, no adenopathy;    Thyroid: no enlargement/tenderness/nodules  Back:     Symmetric, no curvature, ROM normal, no CVA tenderness  Lungs:     Clear to auscultation bilaterally, respirations unlabored  Chest Wall:    No tenderness or deformity   Heart:    Regular rate and rhythm, S1 and S2 normal, no murmur, rub   or gallop  Breast  Exam:    Deferred to GYN  Abdomen:     Soft, non-tender, bowel sounds active all four quadrants,    no masses, no organomegaly  Genitalia:    Deferred to GYN  Rectal:    Extremities:   R wrist very TTP along radial aspect, as is thenar eminence and thumb.  + TTP over snuff box.  Pain w/ both flexion and extension of wrist.  Pain w/ radial deviation.  + swelling.  No bruising.  Pulses:   2+ and symmetric all extremities  Skin:   Skin color, texture, turgor normal, no rashes or lesions  Lymph nodes:   Cervical, supraclavicular, and axillary nodes normal  Neurologic:   CNII-XII intact, normal strength, sensation and reflexes    throughout          Assessment & Plan:

## 2012-05-29 NOTE — Assessment & Plan Note (Signed)
radiographs negative.  Location of pain concerning for occult scaphoid fracture.  Is unusual that her pain didn't become intense until past couple nights when injury was on the 8th.  Advised we treat this conservatively with thumb spica brace to wear as she would a cast.  Elevation.  Declined stronger pain medication - going to take tylenol, nsaids as needed.  F/u in 2 weeks for reevaluation, possible repeat radiographs.  Discussed Vitamin C daily to decrease risk of RSD.

## 2012-05-29 NOTE — Assessment & Plan Note (Signed)
Pt's PE WNL w/ exception of wrist pain and swelling.  UTD on GYN and colonoscopy.  Pt recently had labs done w/ [facility] provider.  These were reviewed and discussed w/ pt (will be scanned).  Anticipatory guidance provided.

## 2012-06-12 ENCOUNTER — Ambulatory Visit (INDEPENDENT_AMBULATORY_CARE_PROVIDER_SITE_OTHER): Payer: BC Managed Care – PPO

## 2012-06-12 ENCOUNTER — Encounter: Payer: Self-pay | Admitting: Family Medicine

## 2012-06-12 ENCOUNTER — Ambulatory Visit (HOSPITAL_BASED_OUTPATIENT_CLINIC_OR_DEPARTMENT_OTHER)
Admission: RE | Admit: 2012-06-12 | Discharge: 2012-06-12 | Disposition: A | Discharge status: Home / Self Care | Payer: BC Managed Care – PPO | Source: Ambulatory Visit | Attending: Family Medicine | Admitting: Family Medicine

## 2012-06-12 ENCOUNTER — Ambulatory Visit (INDEPENDENT_AMBULATORY_CARE_PROVIDER_SITE_OTHER): Payer: BC Managed Care – PPO | Admitting: Family Medicine

## 2012-06-12 VITALS — BP 130/82 | HR 70 | Ht 65.0 in | Wt 200.0 lb

## 2012-06-12 DIAGNOSIS — M25539 Pain in unspecified wrist: Secondary | ICD-10-CM | POA: Insufficient documentation

## 2012-06-12 DIAGNOSIS — M25531 Pain in right wrist: Secondary | ICD-10-CM

## 2012-06-12 DIAGNOSIS — M19039 Primary osteoarthritis, unspecified wrist: Secondary | ICD-10-CM

## 2012-06-12 NOTE — Patient Instructions (Addendum)
Continue wearing the thumb spica brace as you would a cast. We will go ahead with an MRI of your wrist to assess for an occult scaphoid fracture (one that doesn't show up on x-rays) or scapholunate dissociation (tearing of the ligament between this bone and the one next to it). I will call you the day following your MRI to go over results.

## 2012-06-12 NOTE — Assessment & Plan Note (Signed)
repeat radiographs negative.  Concerning she is 2 1/2 weeks out from [facility] type injury and has persistent pain at scaphoid.  Continue with thumb spica for now.  Will go ahead with MRI to assess for occult scaphoid fracture or scapholunate dissociation.  Will call with how to proceed based on these results.

## 2012-06-12 NOTE — Progress Notes (Addendum)
Subjective:    Patient ID: Christie Thomas, female    DOB: 1955-12-25, 57 y.o.   MRN: [id number]  PCP: Dr. Beverely Low  HPI  57 yo F here for f/u right wrist pain.  3/10: Patient reports on 3/8 she was cleaning a chandelier when she fell off a step and sustained a FOOSH type injury to right wrist. Had some mild pain after this but felt like she could do everything she needed to. Soreness worsened but was not very severe until 3/10 at night - severe pain base of right thumb, difficulty bending thumb and fingers. Some numbness into digits. + swelling but no obvious bruising. Has had a cyst removed from this wrist and carpal tunnel release. Is right handed. Iced all night. Recently given a wrist brace.  3/24: Patient returns with persistent right wrist pain. She wears thumb spica regularly. Not taking anything for pain currently. Pain is at base of thumb in snuffbox. No other complaints.  Past Medical History  Diagnosis Date  . Asthma   . GERD (gastroesophageal reflux disease)   . Hay fever   . Hyperlipidemia   . Epilepsy     Current Outpatient Prescriptions on File Prior to Visit  Medication Sig Dispense Refill  . albuterol (PROVENTIL HFA;VENTOLIN HFA) 108 (90 BASE) MCG/ACT inhaler Inhale 2 puffs into the lungs every 4 (four) hours as needed.      . AMBULATORY NON FORMULARY MEDICATION Ultra flora IB -- once daily       . beclomethasone (QVAR) 40 MCG/ACT inhaler Inhale 2 puffs into the lungs 2 (two) times daily.      . clonazePAM (KLONOPIN) 0.5 MG tablet TAKE 1 TABLET BY MOUTH THREE TIMES DAILY AS NEEDED FOR ANXIETY  60 tablet  0  . dexlansoprazole (DEXILANT) 60 MG capsule Take 60 mg by mouth daily.        . fenofibrate 160 MG tablet Take 1 tablet (160 mg total) by mouth daily.  30 tablet  6  . fluticasone (FLONASE) 50 MCG/ACT nasal spray Place 2 sprays into the nose daily.  16 g  3  . hydrOXYzine (ATARAX/VISTARIL) 10 MG tablet Take 1 tablet (10 mg total) by mouth 4 (four)  times daily as needed. For allergy symptoms.  30 tablet  1  . rosuvastatin (CRESTOR) 10 MG tablet Take 1 tablet (10 mg total) by mouth daily.  30 tablet  0   No current facility-administered medications on file prior to visit.    Past Surgical History  Procedure Laterality Date  . Cholecystectomy    . Breast biopsy      fatty tumor  . Tonsillectomy and adenoidectomy    . Back surgery      x2  . Shoulder surgery      right  . Cesarean section    . Breast enhancement surgery    . Cardiac catheterization  07/2010  . Hand surgery      carpal tunnel release, cyst excision    Allergies  Allergen Reactions  . Penicillins     Throat swelling   . Other     Shell fish    History   Social History  . Marital Status: Married    Spouse Name: N/A    Number of Children: N/A  . Years of Education: N/A   Occupational History  . Not on file.   Social History Main Topics  . Smoking status: Never Smoker   . Smokeless tobacco: Not on file  . Alcohol Use: No  .  Drug Use: No  . Sexually Active: Not on file   Other Topics Concern  . Not on file   Social History Narrative  . No narrative on file    Family History  Problem Relation Age of Onset  . Stroke Mother   . Hypertension Mother   . Hyperlipidemia Mother   . Diabetes Mother   . Sudden death Paternal Grandfather 60  . Heart attack Neg Hx     BP 130/82  Pulse 70  Ht 5\' 5"  (1.651 m)  Wt 200 lb (90.719 kg)  BMI 33.28 kg/m2  Review of Systems  See HPI above.    Objective:   Physical Exam  Gen: NAD  R wrist: Mild swelling radial aspect of wrist.  No other deformity, bruising.  No erythema, warmth. TTP focally in anatomic snuffbox.  No other TTP about wrist, hand, digits. Able to extend and abduct fingers, oppose thumb Sensation intact. Cap refill < 2 sec.    Assessment & Plan:  1. Right wrist pain - repeat radiographs negative.  Concerning she is 2 1/2 weeks out from [facility] type injury and has persistent  pain at scaphoid.  Continue with thumb spica for now.  Will go ahead with MRI to assess for occult scaphoid fracture or scapholunate dissociation.  Will call with how to proceed based on these results.  Addendum 3/27:  MRI reviewed and discussed with patient.  No evidence of scaphoid fracture or scapholunate dissociation.  She does have 1st [facility] DJD which is likely cause of her pain, flared up by fall.  She would like to continue with conservative therapy - brace as needed now, tylenol, nsaids, capsaicin, glucosamine.  Consider injection if not improving.  F/u prn.

## 2012-06-14 ENCOUNTER — Telehealth: Payer: Self-pay | Admitting: Family Medicine

## 2012-06-14 NOTE — Telephone Encounter (Signed)
REFILLS NEEDED ON HYDROXYZINE  HCL 10 MG TABLET  #30 SIG; TAKE 1 TABLET BY MOUTH 4 TIMES DAILY AS NEEDED FOR ALLERGY SYMPTOMS. LAST FILLED : 03.11.2014       AUTH REFILLS: 1

## 2012-06-15 MED ORDER — HYDROXYZINE HCL 10 MG PO TABS: 10.0000 mg | ORAL_TABLET | Freq: Four times a day (QID) | ORAL | Status: DC | PRN

## 2012-06-15 NOTE — Telephone Encounter (Signed)
last seen and filled 05/29/12. Please advise      KP

## 2012-06-15 NOTE — Telephone Encounter (Signed)
Ok for #30, 1 refill 

## 2012-06-15 NOTE — Telephone Encounter (Signed)
Rx sent to the pharmacy by e-script.//AB/CMA 

## 2012-07-30 ENCOUNTER — Other Ambulatory Visit: Payer: Self-pay | Admitting: Family Medicine

## 2012-07-31 NOTE — Telephone Encounter (Signed)
Med filled.  

## 2012-08-15 ENCOUNTER — Other Ambulatory Visit: Payer: Self-pay | Admitting: Family Medicine

## 2012-08-16 NOTE — Telephone Encounter (Signed)
Last OV 05-29-12 Clonopin last filled 02-17-2012 #60 with 0 refills Crestor  Last filled 05-24-2012 #30 with 0 refills.   Last Lipid panel was 11-28-2011, pt had been advised to come back for repeat LFT's.

## 2012-08-16 NOTE — Telephone Encounter (Signed)
Med filled. Called pt LMOVM to schedule CPE.

## 2012-08-16 NOTE — Telephone Encounter (Signed)
Ok for #30 crestor and #60 klonopin- due for CPE if not already scheduled

## 2012-08-24 ENCOUNTER — Other Ambulatory Visit: Payer: Self-pay | Admitting: Family Medicine

## 2012-08-24 NOTE — Telephone Encounter (Signed)
Med filled.  

## 2012-09-27 ENCOUNTER — Other Ambulatory Visit: Payer: Self-pay | Admitting: Family Medicine

## 2012-11-21 ENCOUNTER — Other Ambulatory Visit: Payer: Self-pay | Admitting: Family Medicine

## 2012-11-21 NOTE — Telephone Encounter (Signed)
Med filled.  

## 2012-11-27 ENCOUNTER — Ambulatory Visit: Payer: BC Managed Care – PPO | Admitting: Family Medicine

## 2012-12-17 ENCOUNTER — Other Ambulatory Visit: Payer: Self-pay | Admitting: Family Medicine

## 2012-12-18 NOTE — Telephone Encounter (Signed)
Rx filled. Pt has an upcoming appointment on 12/24/2012

## 2012-12-24 ENCOUNTER — Encounter: Payer: Self-pay | Admitting: Family Medicine

## 2012-12-24 ENCOUNTER — Ambulatory Visit (INDEPENDENT_AMBULATORY_CARE_PROVIDER_SITE_OTHER): Payer: BC Managed Care – PPO | Admitting: Family Medicine

## 2012-12-24 VITALS — BP 126/76 | HR 78 | Temp 98.4°F | Resp 16 | Wt 186.1 lb

## 2012-12-24 DIAGNOSIS — R7309 Other abnormal glucose: Secondary | ICD-10-CM

## 2012-12-24 DIAGNOSIS — E785 Hyperlipidemia, unspecified: Secondary | ICD-10-CM

## 2012-12-24 DIAGNOSIS — R7302 Impaired glucose tolerance (oral): Secondary | ICD-10-CM

## 2012-12-24 DIAGNOSIS — E559 Vitamin D deficiency, unspecified: Secondary | ICD-10-CM

## 2012-12-24 LAB — HEPATIC FUNCTION PANEL
Albumin: 4.5 g/dL (ref 3.5–5.2)
Bilirubin, Direct: 0.1 mg/dL (ref 0.0–0.3)
Total Protein: 8 g/dL (ref 6.0–8.3)

## 2012-12-24 LAB — LIPID PANEL
Cholesterol: 150 mg/dL (ref 0–200)
HDL: 48.1 mg/dL (ref >39.00)
Total CHOL/HDL Ratio: 3
Triglycerides: 138 mg/dL (ref 0.0–149.0)

## 2012-12-24 LAB — BASIC METABOLIC PANEL
BUN: 17 mg/dL (ref 6–23)
CO2: 23 mEq/L (ref 19–32)
Calcium: 9.3 mg/dL (ref 8.4–10.5)
Creatinine, Ser: 0.8 mg/dL (ref 0.4–1.2)

## 2012-12-24 LAB — CBC WITH DIFFERENTIAL/PLATELET
Basophils Absolute: 0 10*3/uL (ref 0.0–0.1)
HCT: 44.8 % (ref 36.0–46.0)
Lymphs Abs: 2.3 10*3/uL (ref 0.7–4.0)
MCHC: 33.7 g/dL (ref 30.0–36.0)
MCV: 81.9 fl (ref 78.0–100.0)
Monocytes Absolute: 0.5 10*3/uL (ref 0.1–1.0)
Neutro Abs: 4.6 10*3/uL (ref 1.4–7.7)
Platelets: 311 10*3/uL (ref 150.0–400.0)
RDW: 15 % — ABNORMAL HIGH (ref 11.5–14.6)

## 2012-12-24 NOTE — Assessment & Plan Note (Signed)
Chronic problem.  Tolerating statin w/out difficulty.  Check labs.  Adjust meds prn.  Applauded recent weight loss efforts.  Will follow.

## 2012-12-24 NOTE — Progress Notes (Signed)
  Subjective:    Patient ID: Christie Thomas, female    DOB: [birth year], 57 y.o.   MRN: [id number]  HPI Hyperlipidemia- chronic problem, on Crestor daily.  Has lost 15 lbs since last visit.  Has given up carbs and sugar.  Denies abd pain, N/V, mylagias.  No CP, SOB, HAs, visual changes, edema.  Glucose intolerance- pt has been losing weight and doing well on her bioidentical hormone replacement.  Due for labs today   Review of Systems For ROS see HPI     Objective:   Physical Exam  Vitals reviewed. Constitutional: She is oriented to person, place, and time. She appears well-developed and well-nourished. No distress.  HENT:  Head: Normocephalic and atraumatic.  Eyes: Conjunctivae and EOM are normal. Pupils are equal, round, and reactive to light.  Neck: Normal range of motion. Neck supple. No thyromegaly present.  Cardiovascular: Normal rate, regular rhythm, normal heart sounds and intact distal pulses.   No murmur heard. Pulmonary/Chest: Effort normal and breath sounds normal. No respiratory distress.  Abdominal: Soft. She exhibits no distension. There is no tenderness.  Musculoskeletal: She exhibits no edema.  Lymphadenopathy:    She has no cervical adenopathy.  Neurological: She is alert and oriented to person, place, and time.  Skin: Skin is warm and dry.  Psychiatric: She has a normal mood and affect. Her behavior is normal.          Assessment & Plan:

## 2012-12-24 NOTE — Patient Instructions (Addendum)
Schedule your complete physical in 6 months We'll notify you of your lab results and make any changes if needed Keep up the good work!  You look great! Call with any questions or concerns Happy Early Birthday!!! 

## 2012-12-26 LAB — VITAMIN D 1,25 DIHYDROXY: Vitamin D 1, 25 (OH)2 Total: 65 pg/mL (ref 18–72)

## 2013-01-09 ENCOUNTER — Other Ambulatory Visit: Payer: Self-pay | Admitting: Family Medicine

## 2013-01-09 NOTE — Telephone Encounter (Signed)
Med filled.  

## 2013-01-14 ENCOUNTER — Other Ambulatory Visit: Payer: Self-pay | Admitting: General Practice

## 2013-01-14 MED ORDER — HYDROXYZINE HCL 10 MG PO TABS: ORAL_TABLET | ORAL | Status: DC

## 2013-01-28 ENCOUNTER — Encounter: Payer: Self-pay | Admitting: Family Medicine

## 2013-02-04 ENCOUNTER — Encounter: Payer: Self-pay | Admitting: Family Medicine

## 2013-02-15 ENCOUNTER — Other Ambulatory Visit: Payer: Self-pay | Admitting: Family Medicine

## 2013-02-15 NOTE — Telephone Encounter (Signed)
Fluticasone refilled per protocol. JG//CMA 

## 2013-02-16 ENCOUNTER — Other Ambulatory Visit: Payer: Self-pay | Admitting: Family Medicine

## 2013-02-18 NOTE — Telephone Encounter (Signed)
Med filled.  

## 2013-04-03 ENCOUNTER — Other Ambulatory Visit: Payer: Self-pay | Admitting: Family Medicine

## 2013-04-03 NOTE — Telephone Encounter (Signed)
Med filled.  

## 2013-04-22 ENCOUNTER — Other Ambulatory Visit: Payer: Self-pay | Admitting: Family Medicine

## 2013-04-22 MED ORDER — CLONAZEPAM 0.5 MG PO TABS: ORAL_TABLET | ORAL | Status: DC

## 2013-04-22 NOTE — Telephone Encounter (Signed)
Klonopin refill request Last OV 12-24-12 Med filled 08-15-12 #60 with 0

## 2013-04-22 NOTE — Telephone Encounter (Signed)
Med filled and faxed.  

## 2013-04-29 ENCOUNTER — Other Ambulatory Visit: Payer: Self-pay | Admitting: Family Medicine

## 2013-04-29 NOTE — Telephone Encounter (Signed)
Med filled.  

## 2013-06-24 ENCOUNTER — Encounter: Payer: BC Managed Care – PPO | Admitting: Family Medicine

## 2013-06-30 ENCOUNTER — Other Ambulatory Visit: Payer: Self-pay | Admitting: Family Medicine

## 2013-07-01 NOTE — Telephone Encounter (Signed)
Med filled.  

## 2013-08-11 ENCOUNTER — Other Ambulatory Visit: Payer: Self-pay | Admitting: Family Medicine

## 2013-08-14 NOTE — Telephone Encounter (Signed)
Med filled #30 with 0 and letter mailed to make appt for cholesterol.

## 2013-08-27 ENCOUNTER — Encounter: Payer: Self-pay | Admitting: Family Medicine

## 2013-08-27 ENCOUNTER — Ambulatory Visit (INDEPENDENT_AMBULATORY_CARE_PROVIDER_SITE_OTHER): Payer: BC Managed Care – PPO | Admitting: Family Medicine

## 2013-08-27 VITALS — BP 124/76 | HR 66 | Temp 98.0°F | Resp 16 | Wt 200.0 lb

## 2013-08-27 DIAGNOSIS — E785 Hyperlipidemia, unspecified: Secondary | ICD-10-CM

## 2013-08-27 DIAGNOSIS — M722 Plantar fascial fibromatosis: Secondary | ICD-10-CM

## 2013-08-27 LAB — LIPID PANEL
Cholesterol: 186 mg/dL (ref 0–200)
HDL: 42.4 mg/dL (ref >39.00)
LDL Cholesterol: 108 mg/dL — ABNORMAL HIGH (ref 0–99)
NONHDL: 143.6
Total CHOL/HDL Ratio: 4
Triglycerides: 179 mg/dL — ABNORMAL HIGH (ref 0.0–149.0)
VLDL: 35.8 mg/dL (ref 0.0–40.0)

## 2013-08-27 LAB — HEPATIC FUNCTION PANEL
ALBUMIN: 4.5 g/dL (ref 3.5–5.2)
ALT: 45 U/L — ABNORMAL HIGH (ref 0–35)
AST: 36 U/L (ref 0–37)
Alkaline Phosphatase: 87 U/L (ref 39–117)
BILIRUBIN DIRECT: 0.1 mg/dL (ref 0.0–0.3)
Total Bilirubin: 0.8 mg/dL (ref 0.2–1.2)
Total Protein: 7.7 g/dL (ref 6.0–8.3)

## 2013-08-27 LAB — BASIC METABOLIC PANEL
BUN: 14 mg/dL (ref 6–23)
CALCIUM: 9.7 mg/dL (ref 8.4–10.5)
CO2: 23 mEq/L (ref 19–32)
Chloride: 108 mEq/L (ref 96–112)
Creatinine, Ser: 0.9 mg/dL (ref 0.4–1.2)
GFR: 68.22 mL/min (ref >60.00)
Glucose, Bld: 99 mg/dL (ref 70–99)
Potassium: 4.1 mEq/L (ref 3.5–5.1)
SODIUM: 142 meq/L (ref 135–145)

## 2013-08-27 NOTE — Progress Notes (Signed)
   Subjective:    Patient ID: Christie Thomas, female    DOB: 03/03/1956, 58 y.o.   MRN: [id number]  HPI Hyperlipidemia- chronic problem, on Crestor.  Denies abd pain, N/V, myalgias, CP, SOB, HAs, visual changes.  R foot pain- hx of plantar fasciitis.  Pt is icing and soaking.  Not taking any NSAIDs.  Not interested in seeing anyone for evaluation.   Review of Systems For ROS see HPI     Objective:   Physical Exam  Vitals reviewed. Constitutional: She is oriented to person, place, and time. She appears well-developed and well-nourished. No distress.  HENT:  Head: Normocephalic and atraumatic.  Eyes: Conjunctivae and EOM are normal. Pupils are equal, round, and reactive to light.  Neck: Normal range of motion. Neck supple. No thyromegaly present.  Cardiovascular: Normal rate, regular rhythm, normal heart sounds and intact distal pulses.   No murmur heard. Pulmonary/Chest: Effort normal and breath sounds normal. No respiratory distress.  Abdominal: Soft. She exhibits no distension. There is no tenderness.  Musculoskeletal: She exhibits tenderness (TTP over insertion of plantar fascia on R). She exhibits no edema.  Lymphadenopathy:    She has no cervical adenopathy.  Neurological: She is alert and oriented to person, place, and time.  Skin: Skin is warm and dry.  Psychiatric: She has a normal mood and affect. Her behavior is normal.          Assessment & Plan:

## 2013-08-27 NOTE — Assessment & Plan Note (Signed)
New to provider, recurrent issue for pt.  Offered NSAIDs and sports med referral but pt reports she prefers to treat w/ ice, stretching, time, and activity modification.  Will follow.

## 2013-08-27 NOTE — Assessment & Plan Note (Signed)
Chronic problem.  Tolerating statin w/o difficulty.  Check labs.  Adjust meds prn  

## 2013-08-27 NOTE — Patient Instructions (Signed)
Schedule your complete physical w/ pap in 6 months We'll notify you of your lab results and make any changes if needed Continue your healthy diet and regular exercise Call with any questions or concerns Enjoy your alone time!!  You've earned it!

## 2013-08-27 NOTE — Progress Notes (Signed)
Pre visit review using our clinic review tool, if applicable. No additional management support is needed unless otherwise documented below in the visit note. 

## 2013-09-04 ENCOUNTER — Other Ambulatory Visit: Payer: Self-pay | Admitting: Family Medicine

## 2013-09-04 NOTE — Telephone Encounter (Signed)
Med filled.  

## 2013-09-18 ENCOUNTER — Encounter: Payer: Self-pay | Admitting: Family Medicine

## 2013-11-12 ENCOUNTER — Other Ambulatory Visit: Payer: Self-pay | Admitting: Family Medicine

## 2013-11-12 NOTE — Telephone Encounter (Signed)
Med filled.  

## 2013-12-18 ENCOUNTER — Telehealth: Payer: Self-pay | Admitting: *Deleted

## 2013-12-18 NOTE — Telephone Encounter (Signed)
Received release of medical info request via mail from [employer]. Request forwarded to medical records. JG//CMA

## 2014-01-31 ENCOUNTER — Other Ambulatory Visit: Payer: Self-pay | Admitting: Family Medicine

## 2014-01-31 NOTE — Telephone Encounter (Signed)
Med filled.  

## 2014-02-06 ENCOUNTER — Other Ambulatory Visit: Payer: Self-pay | Admitting: Family Medicine

## 2014-02-06 NOTE — Telephone Encounter (Signed)
Med filled.  

## 2014-02-17 LAB — HM MAMMOGRAPHY

## 2014-02-26 ENCOUNTER — Encounter: Payer: Self-pay | Admitting: Family Medicine

## 2014-02-26 ENCOUNTER — Other Ambulatory Visit: Payer: Self-pay | Admitting: General Practice

## 2014-02-26 ENCOUNTER — Ambulatory Visit (INDEPENDENT_AMBULATORY_CARE_PROVIDER_SITE_OTHER): Payer: BC Managed Care – PPO | Admitting: Family Medicine

## 2014-02-26 VITALS — BP 124/76 | HR 76 | Temp 98.1°F | Resp 16 | Ht 64.5 in | Wt 203.2 lb

## 2014-02-26 DIAGNOSIS — Z79899 Other long term (current) drug therapy: Secondary | ICD-10-CM

## 2014-02-26 DIAGNOSIS — Z0000 Encounter for general adult medical examination without abnormal findings: Secondary | ICD-10-CM

## 2014-02-26 DIAGNOSIS — E785 Hyperlipidemia, unspecified: Secondary | ICD-10-CM | POA: Diagnosis not present

## 2014-02-26 LAB — CBC WITH DIFFERENTIAL/PLATELET
Basophils Absolute: 0.1 10*3/uL (ref 0.0–0.1)
Basophils Relative: 0.7 % (ref 0.0–3.0)
Eosinophils Absolute: 0.7 10*3/uL (ref 0.0–0.7)
Eosinophils Relative: 9 % — ABNORMAL HIGH (ref 0.0–5.0)
HCT: 43.2 % (ref 36.0–46.0)
Hemoglobin: 14.6 g/dL (ref 12.0–15.0)
LYMPHS PCT: 33.7 % (ref 12.0–46.0)
Lymphs Abs: 2.6 10*3/uL (ref 0.7–4.0)
MCHC: 33.8 g/dL (ref 30.0–36.0)
MCV: 85.8 fl (ref 78.0–100.0)
MONOS PCT: 7.8 % (ref 3.0–12.0)
Monocytes Absolute: 0.6 10*3/uL (ref 0.1–1.0)
Neutro Abs: 3.7 10*3/uL (ref 1.4–7.7)
Neutrophils Relative %: 48.8 % (ref 43.0–77.0)
PLATELETS: 302 10*3/uL (ref 150.0–400.0)
RBC: 5.03 Mil/uL (ref 3.87–5.11)
RDW: 13 % (ref 11.5–15.5)
WBC: 7.7 10*3/uL (ref 4.0–10.5)

## 2014-02-26 LAB — HEPATIC FUNCTION PANEL
ALBUMIN: 4.6 g/dL (ref 3.5–5.2)
ALK PHOS: 100 U/L (ref 39–117)
ALT: 44 U/L — ABNORMAL HIGH (ref 0–35)
AST: 37 U/L (ref 0–37)
Bilirubin, Direct: 0.1 mg/dL (ref 0.0–0.3)
TOTAL PROTEIN: 7.7 g/dL (ref 6.0–8.3)
Total Bilirubin: 0.9 mg/dL (ref 0.2–1.2)

## 2014-02-26 LAB — BASIC METABOLIC PANEL
BUN: 18 mg/dL (ref 6–23)
CHLORIDE: 106 meq/L (ref 96–112)
CO2: 22 mEq/L (ref 19–32)
Calcium: 9.2 mg/dL (ref 8.4–10.5)
Creatinine, Ser: 0.9 mg/dL (ref 0.4–1.2)
GFR: 68.99 mL/min (ref >60.00)
GLUCOSE: 99 mg/dL (ref 70–99)
Potassium: 4 mEq/L (ref 3.5–5.1)
Sodium: 139 mEq/L (ref 135–145)

## 2014-02-26 LAB — LIPID PANEL
CHOLESTEROL: 189 mg/dL (ref 0–200)
HDL: 42.8 mg/dL (ref >39.00)
NonHDL: 146.2
TRIGLYCERIDES: 300 mg/dL — AB (ref 0.0–149.0)
Total CHOL/HDL Ratio: 4
VLDL: 60 mg/dL — AB (ref 0.0–40.0)

## 2014-02-26 LAB — TSH: TSH: 1.91 u[IU]/mL (ref 0.35–4.50)

## 2014-02-26 LAB — VITAMIN D 25 HYDROXY (VIT D DEFICIENCY, FRACTURES): VITD: 23.43 ng/mL — AB (ref 30.00–100.00)

## 2014-02-26 LAB — LDL CHOLESTEROL, DIRECT: Direct LDL: 96.2 mg/dL

## 2014-02-26 MED ORDER — VITAMIN D (ERGOCALCIFEROL) 1.25 MG (50000 UNIT) PO CAPS: 50000.0000 [IU] | ORAL_CAPSULE | ORAL | Status: DC

## 2014-02-26 MED ORDER — FENOFIBRATE 160 MG PO TABS: 160.0000 mg | ORAL_TABLET | Freq: Every day | ORAL | Status: DC

## 2014-02-26 NOTE — Progress Notes (Signed)
   Subjective:    Patient ID: Christie Thomas, female    DOB: November 25, 1955, 58 y.o.   MRN: [id number]  HPI CPE- UTD on mammo, pap, colonoscopy, DEXA.     Review of Systems Patient reports no vision/hearing changes, adenopathy,fever, weight change,  persistant/recurrent hoarseness , swallowing issues, chest pain, palpitations, edema, persistant/recurrent cough, hemoptysis, dyspnea (rest/exertional/paroxysmal nocturnal), gastrointestinal bleeding (melena, rectal bleeding), abdominal pain, significant heartburn, bowel changes, GU symptoms (dysuria, hematuria, incontinence), Gyn symptoms (abnormal  bleeding, pain),  syncope, focal weakness, memory loss, numbness & tingling, skin/hair/nail changes, abnormal bruising or bleeding, anxiety, or depression.     Objective:   Physical Exam General Appearance:    Alert, cooperative, no distress, appears stated age  Head:    Normocephalic, without obvious abnormality, atraumatic  Eyes:    PERRL, conjunctiva/corneas clear, EOM's intact, fundi    benign, both eyes  Ears:    Normal TM's and external ear canals, both ears  Nose:   Nares normal, septum midline, mucosa normal, no drainage    or sinus tenderness  Throat:   Lips, mucosa, and tongue normal; teeth and gums normal  Neck:   Supple, symmetrical, trachea midline, no adenopathy;    Thyroid: no enlargement/tenderness/nodules  Back:     Symmetric, no curvature, ROM normal, no CVA tenderness  Lungs:     Clear to auscultation bilaterally, respirations unlabored  Chest Wall:    No tenderness or deformity   Heart:    Regular rate and rhythm, S1 and S2 normal, no murmur, rub   or gallop  Breast Exam:    Deferred to GYN  Abdomen:     Soft, non-tender, bowel sounds active all four quadrants,    no masses, no organomegaly  Genitalia:    Deferred to GYN  Rectal:    Extremities:   Extremities normal, atraumatic, no cyanosis or edema  Pulses:   2+ and symmetric all extremities  Skin:   Skin color,  texture, turgor normal, no rashes or lesions  Lymph nodes:   Cervical, supraclavicular, and axillary nodes normal  Neurologic:   CNII-XII intact, normal strength, sensation and reflexes    throughout          Assessment & Plan:

## 2014-02-26 NOTE — Assessment & Plan Note (Signed)
Pt's PE WNL.  UTD on GYN, colonoscopy.  Check labs.  Anticipatory guidance provided.  

## 2014-02-26 NOTE — Patient Instructions (Signed)
Follow up in 6 months to recheck cholesterol We'll notify you of your lab results and make any changes if needed Keep up the good on healthy diet and regular exercise! Call with any questions or concerns Happy Holidays!!!

## 2014-02-26 NOTE — Progress Notes (Signed)
Pre visit review using our clinic review tool, if applicable. No additional management support is needed unless otherwise documented below in the visit note. 

## 2014-02-28 ENCOUNTER — Other Ambulatory Visit: Payer: Self-pay | Admitting: Family Medicine

## 2014-02-28 NOTE — Telephone Encounter (Signed)
Last OV 02-26-14 Clonazepam last filled 04-22-13 #60 with 1

## 2014-02-28 NOTE — Telephone Encounter (Signed)
Med filled and faxed.  

## 2014-03-21 ENCOUNTER — Other Ambulatory Visit: Payer: Self-pay | Admitting: Family Medicine

## 2014-03-24 NOTE — Telephone Encounter (Signed)
Med filled.  

## 2014-04-23 ENCOUNTER — Ambulatory Visit (INDEPENDENT_AMBULATORY_CARE_PROVIDER_SITE_OTHER): Payer: BLUE CROSS/BLUE SHIELD | Admitting: Physician Assistant

## 2014-04-23 ENCOUNTER — Encounter: Payer: Self-pay | Admitting: Physician Assistant

## 2014-04-23 VITALS — BP 112/88 | HR 83 | Temp 98.0°F | Resp 16 | Ht 64.5 in | Wt 207.2 lb

## 2014-04-23 DIAGNOSIS — J209 Acute bronchitis, unspecified: Secondary | ICD-10-CM

## 2014-04-23 MED ORDER — DOXYCYCLINE MONOHYDRATE 100 MG PO CAPS: 100.0000 mg | ORAL_CAPSULE | Freq: Two times a day (BID) | ORAL | Status: DC

## 2014-04-23 MED ORDER — HYDROCOD POLST-CHLORPHEN POLST 10-8 MG/5ML PO LQCR: 5.0000 mL | Freq: Two times a day (BID) | ORAL | Status: DC | PRN

## 2014-04-23 NOTE — Patient Instructions (Signed)
Please take Doxycycline as directed.  Increase fluids.  Get plenty of rest. Use Tussionex as directed for cough. Mucinex for congestion.  Resume Flonase. Continue asthma medications.  Call or return if symptoms are not improving.  Acute Bronchitis Bronchitis is when the airways that extend from the windpipe into the lungs get red, puffy, and painful (inflamed). Bronchitis often causes thick spit (mucus) to develop. This leads to a cough. A cough is the most common symptom of bronchitis. In acute bronchitis, the condition usually begins suddenly and goes away over time (usually in 2 weeks). Smoking, allergies, and asthma can make bronchitis worse. Repeated episodes of bronchitis may cause more lung problems. HOME CARE  Rest.  Drink enough fluids to keep your pee (urine) clear or pale yellow (unless you need to limit fluids as told by your doctor).  Only take over-the-counter or prescription medicines as told by your doctor.  Avoid smoking and secondhand smoke. These can make bronchitis worse. If you are a smoker, think about using nicotine gum or skin patches. Quitting smoking will help your lungs heal faster.  Reduce the chance of getting bronchitis again by:  Washing your hands often.  Avoiding people with cold symptoms.  Trying not to touch your hands to your mouth, nose, or eyes.  Follow up with your doctor as told. GET HELP IF: Your symptoms do not improve after 1 week of treatment. Symptoms include:  Cough.  Fever.  Coughing up thick spit.  Body aches.  Chest congestion.  Chills.  Shortness of breath.  Sore throat. GET HELP RIGHT AWAY IF:   You have an increased fever.  You have chills.  You have severe shortness of breath.  You have bloody thick spit (sputum).  You throw up (vomit) often.  You lose too much body fluid (dehydration).  You have a severe headache.  You faint. MAKE SURE YOU:   Understand these instructions.  Will watch your  condition.  Will get help right away if you are not doing well or get worse. Document Released: 08/24/2007 Document Revised: 11/07/2012 Document Reviewed: 08/28/2012 [facility] Patient Information 2015 [place], [place]. This information is not intended to replace advice given to you by your health care provider. Make sure you discuss any questions you have with your health care provider.

## 2014-04-23 NOTE — Progress Notes (Signed)
  Subjective:     Christie Thomas is a 59 y.o. female who presents for evaluation of symptoms of a URI. Symptoms include bilateral ear pressure/pain, congestion, low grade fever, nasal congestion, post nasal drip, productive cough with  yellow colored sputum and sore throat. Onset of symptoms was 5 weeks ago, and has been gradually worsening since that time. Treatment to date: cough suppressants.  The following portions of the patient's history were reviewed and updated as appropriate: allergies, current medications, past family history, past medical history, past social history, past surgical history and problem list.  Review of Systems Pertinent items are noted in HPI.   Objective:    BP 115/100 mmHg  Pulse 83  Temp(Src) 98 F (36.7 C) (Oral)  Resp 16  Ht 5' 4.5" (1.638 m)  Wt 207 lb 4 oz (94.008 kg)  BMI 35.04 kg/m2  SpO2 100% General appearance: alert, cooperative, appears stated age and no distress Head: Normocephalic, without obvious abnormality, atraumatic Ears: TM with fluid levels noted bilaterally. Nose: Nares normal. Septum midline. Mucosa normal. No drainage or sinus tenderness. Throat: lips, mucosa, and tongue normal; teeth and gums normal Lungs: clear to auscultation bilaterally Heart: regular rate and rhythm, S1, S2 normal, no murmur, click, rub or gallop Lymph nodes: Cervical, supraclavicular, and axillary nodes normal.   Assessment:    bronchitis and bronchospasm   Plan:    Suggested symptomatic OTC remedies. Nasal saline spray for congestion. Doxycycline per orders. Nasal steroids per orders. Follow up as needed.

## 2014-04-23 NOTE — Progress Notes (Signed)
Pre visit review using our clinic review tool, if applicable. No additional management support is needed unless otherwise documented below in the visit note/SLS  

## 2014-04-27 ENCOUNTER — Other Ambulatory Visit: Payer: Self-pay | Admitting: Family Medicine

## 2014-04-28 NOTE — Telephone Encounter (Signed)
Med filled.  

## 2014-05-13 ENCOUNTER — Telehealth: Payer: Self-pay | Admitting: Family Medicine

## 2014-05-13 NOTE — Telephone Encounter (Signed)
Appointment scheduled.

## 2014-05-13 NOTE — Telephone Encounter (Signed)
Patient needs appointment; Dr Etter Sjogren still has 6:15p opening/SLS

## 2014-05-13 NOTE — Telephone Encounter (Signed)
Caller name: [place] Relation to pt: self Call back number: [phone] Pharmacy: [facility] on [address]  Reason for call:   Patient states that she is not feeling any better since last visit with [facility], [place]. Symptoms are cough,sweats, fever

## 2014-05-14 ENCOUNTER — Encounter: Payer: Self-pay | Admitting: Physician Assistant

## 2014-05-14 ENCOUNTER — Ambulatory Visit (HOSPITAL_BASED_OUTPATIENT_CLINIC_OR_DEPARTMENT_OTHER)
Admission: RE | Admit: 2014-05-14 | Discharge: 2014-05-14 | Disposition: A | Discharge status: Home / Self Care | Payer: BLUE CROSS/BLUE SHIELD | Source: Ambulatory Visit | Attending: Physician Assistant | Admitting: Physician Assistant

## 2014-05-14 ENCOUNTER — Ambulatory Visit (INDEPENDENT_AMBULATORY_CARE_PROVIDER_SITE_OTHER): Payer: BLUE CROSS/BLUE SHIELD | Admitting: Physician Assistant

## 2014-05-14 VITALS — BP 112/64 | HR 76 | Temp 98.8°F | Resp 16 | Ht 64.5 in | Wt 206.0 lb

## 2014-05-14 DIAGNOSIS — J00 Acute nasopharyngitis [common cold]: Secondary | ICD-10-CM

## 2014-05-14 DIAGNOSIS — J208 Acute bronchitis due to other specified organisms: Secondary | ICD-10-CM

## 2014-05-14 DIAGNOSIS — J45909 Unspecified asthma, uncomplicated: Secondary | ICD-10-CM | POA: Diagnosis not present

## 2014-05-14 DIAGNOSIS — R0602 Shortness of breath: Secondary | ICD-10-CM | POA: Insufficient documentation

## 2014-05-14 DIAGNOSIS — R05 Cough: Secondary | ICD-10-CM | POA: Diagnosis present

## 2014-05-14 DIAGNOSIS — B9689 Other specified bacterial agents as the cause of diseases classified elsewhere: Secondary | ICD-10-CM

## 2014-05-14 DIAGNOSIS — J209 Acute bronchitis, unspecified: Secondary | ICD-10-CM

## 2014-05-14 DIAGNOSIS — R509 Fever, unspecified: Secondary | ICD-10-CM

## 2014-05-14 MED ORDER — PREDNISONE 20 MG PO TABS: 40.0000 mg | ORAL_TABLET | Freq: Every day | ORAL | Status: DC

## 2014-05-14 MED ORDER — ALBUTEROL SULFATE (2.5 MG/3ML) 0.083% IN NEBU
2.5000 mg | INHALATION_SOLUTION | Freq: Once | RESPIRATORY_TRACT | Status: AC
  Administered 2014-05-14: 2.5 mg via RESPIRATORY_TRACT

## 2014-05-14 MED ORDER — HYDROCOD POLST-CHLORPHEN POLST 10-8 MG/5ML PO LQCR: 5.0000 mL | Freq: Two times a day (BID) | ORAL | Status: DC | PRN

## 2014-05-14 MED ORDER — LEVOFLOXACIN 750 MG PO TABS: 750.0000 mg | ORAL_TABLET | Freq: Every day | ORAL | Status: DC

## 2014-05-14 NOTE — Addendum Note (Signed)
Addended by: Rockwell Germany on: 05/14/2014 05:13 PM   Modules accepted: Orders

## 2014-05-14 NOTE — Progress Notes (Signed)
Patient presents to clinic today c/o continue chest congestion and productive cough associated with mild chest tightness and wheezing.  Was previously treated with Doxycycline with some initial improvement, but states symptoms returned and worsened.  Denies fever, chills, chest pain or SOB.  Is not using her Qvar as directed.  Past Medical History  Diagnosis Date  . Asthma   . GERD (gastroesophageal reflux disease)   . Hay fever   . Hyperlipidemia   . Epilepsy     Current Outpatient Prescriptions on File Prior to Visit  Medication Sig Dispense Refill  . albuterol (PROVENTIL HFA;VENTOLIN HFA) 108 (90 BASE) MCG/ACT inhaler Inhale 2 puffs into the lungs every 4 (four) hours as needed.    . AMBULATORY NON FORMULARY MEDICATION Ultra flora IB -- once daily     . beclomethasone (QVAR) 40 MCG/ACT inhaler Inhale 2 puffs into the lungs 2 (two) times daily as needed.     . clonazePAM (KLONOPIN) 0.5 MG tablet take 1 tablet by mouth three times a day if needed for anxiety 60 tablet 1  . CRESTOR 10 MG tablet take 1 tablet by mouth once daily 30 tablet 5  . fenofibrate 160 MG tablet Take 1 tablet (160 mg total) by mouth daily. 30 tablet 6  . fluticasone (FLONASE) 50 MCG/ACT nasal spray instill 1 to 2 sprays into each nostril daily (Patient taking differently: Use as Directed as Needed) 16 g 3  . hydrOXYzine (ATARAX/VISTARIL) 10 MG tablet take 1 tablet by mouth four times a day if needed for allergies 30 tablet 1  . Vitamin D, Ergocalciferol, (DRISDOL) 50000 UNITS CAPS capsule Take 1 capsule (50,000 Units total) by mouth every 7 (seven) days. 12 capsule 0   No current facility-administered medications on file prior to visit.    Allergies  Allergen Reactions  . Penicillins     Throat swelling   . Other     Shell fish    Family History  Problem Relation Age of Onset  . Stroke Mother   . Hypertension Mother   . Hyperlipidemia Mother   . Diabetes Mother   . Sudden death Paternal  Grandfather 15  . Heart attack Neg Hx     History   Social History  . Marital Status: Married    Spouse Name: N/A  . Number of Children: N/A  . Years of Education: N/A   Social History Main Topics  . Smoking status: Never Smoker   . Smokeless tobacco: Not on file  . Alcohol Use: No  . Drug Use: No  . Sexual Activity: Not on file   Other Topics Concern  . None   Social History Narrative    Review of Systems - See HPI.  All other ROS are negative.  BP 112/64 mmHg  Pulse 76  Temp(Src) 98.8 F (37.1 C) (Oral)  Resp 16  Ht 5' 4.5" (1.638 m)  Wt 206 lb (93.441 kg)  BMI 34.83 kg/m2  SpO2 99%  Physical Exam  Constitutional: She is oriented to person, place, and time and well-developed, well-nourished, and in no distress.  HENT:  Head: Normocephalic and atraumatic.  Right Ear: External ear normal.  Left Ear: External ear normal.  Nose: Nose normal.  Mouth/Throat: Oropharynx is clear and moist. No oropharyngeal exudate.  Eyes: Conjunctivae are normal. Pupils are equal, round, and reactive to light.  Neck: Neck supple.  Cardiovascular: Normal rate, regular rhythm, normal heart sounds and intact distal pulses.   Pulmonary/Chest: Effort normal. She has  wheezes. She has no rales. She exhibits no tenderness.  Lymphadenopathy:    She has no cervical adenopathy.  Neurological: She is alert and oriented to person, place, and time.  Skin: Skin is warm and dry. No rash noted.  Psychiatric: Affect normal.  Vitals reviewed.   Recent Results (from the past 2160 hour(s))  HM MAMMOGRAPHY     Status: None   Collection Time: 02/17/14 12:00 AM  Result Value Ref Range   HM Mammogram bi-rads 1   Lipid panel     Status: Abnormal   Collection Time: 02/26/14  9:53 AM  Result Value Ref Range   Cholesterol 189 0 - 200 mg/dL    Comment: ATP III Classification       Desirable:  < 200 mg/dL               Borderline High:  200 - 239 mg/dL          High:  > = 240 mg/dL   Triglycerides  300.0 (H) 0.0 - 149.0 mg/dL    Comment: Normal:  <150 mg/dLBorderline High:  150 - 199 mg/dL   HDL 42.80 >39.00 mg/dL   VLDL 60.0 (H) 0.0 - 40.0 mg/dL   Total CHOL/HDL Ratio 4     Comment:                Men          Women1/2 Average Risk     3.4          3.3Average Risk          5.0          4.42X Average Risk          9.6          7.13X Average Risk          15.0          11.0                       NonHDL 146.20     Comment: NOTE:  Non-HDL goal should be 30 mg/dL higher than patient's LDL goal (i.e. LDL goal of < 70 mg/dL, would have non-HDL goal of < 100 mg/dL)  Basic metabolic panel     Status: None   Collection Time: 02/26/14  9:53 AM  Result Value Ref Range   Sodium 139 135 - 145 mEq/L   Potassium 4.0 3.5 - 5.1 mEq/L   Chloride 106 96 - 112 mEq/L   CO2 22 19 - 32 mEq/L   Glucose, Bld 99 70 - 99 mg/dL   BUN 18 6 - 23 mg/dL   Creatinine, Ser 0.9 0.4 - 1.2 mg/dL   Calcium 9.2 8.4 - 10.5 mg/dL   GFR 68.99 >60.00 mL/min  TSH     Status: None   Collection Time: 02/26/14  9:53 AM  Result Value Ref Range   TSH 1.91 0.35 - 4.50 uIU/mL  Hepatic function panel     Status: Abnormal   Collection Time: 02/26/14  9:53 AM  Result Value Ref Range   Total Bilirubin 0.9 0.2 - 1.2 mg/dL   Bilirubin, Direct 0.1 0.0 - 0.3 mg/dL   Alkaline Phosphatase 100 39 - 117 U/L   AST 37 0 - 37 U/L   ALT 44 (H) 0 - 35 U/L   Total Protein 7.7 6.0 - 8.3 g/dL   Albumin 4.6 3.5 - 5.2 g/dL  CBC with Differential  Status: Abnormal   Collection Time: 02/26/14  9:53 AM  Result Value Ref Range   WBC 7.7 4.0 - 10.5 K/uL   RBC 5.03 3.87 - 5.11 Mil/uL   Hemoglobin 14.6 12.0 - 15.0 g/dL   HCT 43.2 36.0 - 46.0 %   MCV 85.8 78.0 - 100.0 fl   MCHC 33.8 30.0 - 36.0 g/dL   RDW 13.0 11.5 - 15.5 %   Platelets 302.0 150.0 - 400.0 K/uL   Neutrophils Relative % 48.8 43.0 - 77.0 %   Lymphocytes Relative 33.7 12.0 - 46.0 %   Monocytes Relative 7.8 3.0 - 12.0 %   Eosinophils Relative 9.0 (H) 0.0 - 5.0 %   Basophils  Relative 0.7 0.0 - 3.0 %   Neutro Abs 3.7 1.4 - 7.7 K/uL   Lymphs Abs 2.6 0.7 - 4.0 K/uL   Monocytes Absolute 0.6 0.1 - 1.0 K/uL   Eosinophils Absolute 0.7 0.0 - 0.7 K/uL   Basophils Absolute 0.1 0.0 - 0.1 K/uL  Vitamin D (25 hydroxy)     Status: Abnormal   Collection Time: 02/26/14  9:53 AM  Result Value Ref Range   VITD 23.43 (L) 30.00 - 100.00 ng/mL  LDL cholesterol, direct     Status: None   Collection Time: 02/26/14  9:53 AM  Result Value Ref Range   Direct LDL 96.2 mg/dL    Comment: Optimal:  <100 mg/dLNear or Above Optimal:  100-129 mg/dLBorderline High:  130-159 mg/dLHigh:  160-189 mg/dLVery High:  >190 mg/dL    Assessment/Plan: Acute bacterial bronchitis Chest tightness improved with albuterol neb. Will obtain CXR today.  Tussionex refilled.  Rx Levaquin.  Supportive measures discussed.  Return precautions given to patient.

## 2014-05-14 NOTE — Patient Instructions (Signed)
Please continue Mucinex twice daily.  Continue chronic medications as directed. Take the Levaquin as directed.  Use Tussionex as directed for cough.    Please go downstairs for an x-ray. I will call you with your results.

## 2014-05-14 NOTE — Progress Notes (Signed)
Pre visit review using our clinic review tool, if applicable. No additional management support is needed unless otherwise documented below in the visit note/SLS  

## 2014-05-14 NOTE — Assessment & Plan Note (Signed)
Chest tightness improved with albuterol neb. Will obtain CXR today.  Tussionex refilled.  Rx Levaquin.  Supportive measures discussed.  Return precautions given to patient.

## 2014-05-15 NOTE — Telephone Encounter (Signed)
Patient is returning Ashley's call about her x ray  Please call  The patient again

## 2014-05-16 NOTE — Telephone Encounter (Signed)
Notes Recorded by Rudene Anda, RN on 05/15/2014 at 10:46 AM Pt notified and made aware of results. No further needs at this time.

## 2014-08-17 ENCOUNTER — Other Ambulatory Visit: Payer: Self-pay | Admitting: Family Medicine

## 2014-08-19 NOTE — Telephone Encounter (Signed)
Med filled.  

## 2014-08-28 ENCOUNTER — Other Ambulatory Visit: Payer: Self-pay | Admitting: General Practice

## 2014-08-28 ENCOUNTER — Encounter: Payer: Self-pay | Admitting: Family Medicine

## 2014-08-28 ENCOUNTER — Ambulatory Visit (INDEPENDENT_AMBULATORY_CARE_PROVIDER_SITE_OTHER): Payer: BLUE CROSS/BLUE SHIELD | Admitting: Family Medicine

## 2014-08-28 ENCOUNTER — Other Ambulatory Visit: Payer: Self-pay | Admitting: Family Medicine

## 2014-08-28 DIAGNOSIS — E785 Hyperlipidemia, unspecified: Secondary | ICD-10-CM

## 2014-08-28 DIAGNOSIS — R945 Abnormal results of liver function studies: Principal | ICD-10-CM

## 2014-08-28 DIAGNOSIS — R7989 Other specified abnormal findings of blood chemistry: Secondary | ICD-10-CM

## 2014-08-28 LAB — LIPID PANEL
CHOL/HDL RATIO: 4
CHOLESTEROL: 203 mg/dL — AB (ref 0–200)
HDL: 57.8 mg/dL (ref >39.00)
LDL CALC: 106 mg/dL — AB (ref 0–99)
NonHDL: 145.2
Triglycerides: 196 mg/dL — ABNORMAL HIGH (ref 0.0–149.0)
VLDL: 39.2 mg/dL (ref 0.0–40.0)

## 2014-08-28 LAB — HEPATIC FUNCTION PANEL
ALK PHOS: 77 U/L (ref 39–117)
ALT: 50 U/L — AB (ref 0–35)
AST: 41 U/L — ABNORMAL HIGH (ref 0–37)
Albumin: 4.6 g/dL (ref 3.5–5.2)
BILIRUBIN DIRECT: 0.1 mg/dL (ref 0.0–0.3)
TOTAL PROTEIN: 7.7 g/dL (ref 6.0–8.3)
Total Bilirubin: 0.6 mg/dL (ref 0.2–1.2)

## 2014-08-28 LAB — BASIC METABOLIC PANEL
BUN: 17 mg/dL (ref 6–23)
CALCIUM: 10.1 mg/dL (ref 8.4–10.5)
CO2: 27 meq/L (ref 19–32)
CREATININE: 0.99 mg/dL (ref 0.40–1.20)
Chloride: 106 mEq/L (ref 96–112)
GFR: 60.91 mL/min (ref >60.00)
Glucose, Bld: 99 mg/dL (ref 70–99)
Potassium: 4.4 mEq/L (ref 3.5–5.1)
SODIUM: 138 meq/L (ref 135–145)

## 2014-08-28 MED ORDER — FENOFIBRATE 160 MG PO TABS: 160.0000 mg | ORAL_TABLET | Freq: Every day | ORAL | Status: DC

## 2014-08-28 NOTE — Assessment & Plan Note (Signed)
Chronic problem.  Tolerating statin w/o difficulty.  Pt is asymptomatic.  Applauded her efforts at exercise.  Check labs.  Adjust meds prn

## 2014-08-28 NOTE — Progress Notes (Signed)
   Subjective:    Patient ID: Christie Thomas, female    DOB: Oct 01, 1955, 59 y.o.   MRN: [id number]  HPI Hyperlipidemia- chronic problem, on Crestor $RemoveBe'10mg'hRyThZuqd$  daily.  Pt is looking forward to resuming exercise now that company has gone home from recent HS graduations.  Pt has started Tai Chi and Yoga.  No CP, SOB, HAs, visual changes, edema, abd pain, N/V.   Review of Systems For ROS see HPI     Objective:   Physical Exam  Constitutional: She is oriented to person, place, and time. She appears well-developed and well-nourished. No distress.  HENT:  Head: Normocephalic and atraumatic.  Eyes: Conjunctivae and EOM are normal. Pupils are equal, round, and reactive to light.  Neck: Normal range of motion. Neck supple. No thyromegaly present.  Cardiovascular: Normal rate, regular rhythm, normal heart sounds and intact distal pulses.   No murmur heard. Pulmonary/Chest: Effort normal and breath sounds normal. No respiratory distress.  Abdominal: Soft. She exhibits no distension. There is no tenderness.  Musculoskeletal: She exhibits no edema.  Lymphadenopathy:    She has no cervical adenopathy.  Neurological: She is alert and oriented to person, place, and time.  Skin: Skin is warm and dry.  Psychiatric: She has a normal mood and affect. Her behavior is normal.  Vitals reviewed.         Assessment & Plan:

## 2014-08-28 NOTE — Progress Notes (Signed)
Pre visit review using our clinic review tool, if applicable. No additional management support is needed unless otherwise documented below in the visit note. 

## 2014-08-28 NOTE — Patient Instructions (Signed)
Schedule your complete physical in 6 months We'll notify you of your lab results and make any changes if needed Keep up the good work on healthy diet and regular exercise- you look great! Call with any questions or concerns Have a great summer!!!

## 2014-11-01 ENCOUNTER — Encounter: Payer: Self-pay | Admitting: Family Medicine

## 2014-11-01 ENCOUNTER — Ambulatory Visit (INDEPENDENT_AMBULATORY_CARE_PROVIDER_SITE_OTHER): Payer: BLUE CROSS/BLUE SHIELD | Admitting: Family Medicine

## 2014-11-01 VITALS — BP 110/82 | Temp 98.3°F | Wt 199.0 lb

## 2014-11-01 DIAGNOSIS — N3090 Cystitis, unspecified without hematuria: Secondary | ICD-10-CM | POA: Insufficient documentation

## 2014-11-01 DIAGNOSIS — N3000 Acute cystitis without hematuria: Secondary | ICD-10-CM | POA: Diagnosis not present

## 2014-11-01 LAB — POCT URINALYSIS DIPSTICK
Bilirubin, UA: NEGATIVE
Blood, UA: POSITIVE
Glucose, UA: NEGATIVE
KETONES UA: NEGATIVE
LEUKOCYTES UA: NEGATIVE
NITRITE UA: NEGATIVE
Protein, UA: POSITIVE
Urobilinogen, UA: 1
pH, UA: 5

## 2014-11-01 MED ORDER — CIPROFLOXACIN HCL 500 MG PO TABS: 500.0000 mg | ORAL_TABLET | Freq: Two times a day (BID) | ORAL | Status: DC

## 2014-11-01 NOTE — Addendum Note (Signed)
Addended by: Miles Costain T on: 11/01/2014 12:57 PM   Modules accepted: Orders

## 2014-11-01 NOTE — Progress Notes (Signed)
SUBJECTIVE: Christie Thomas is a 59 y.o. female pt of Dr. Birdie Riddle, new to me, who presents to weekend clinic with complaints of urinary frequency, urgency, fever and dysuria x 7 days, without flank pain or abnormal vaginal discharge or bleeding.   Tmax 100 last night.  PMH significant for PCN allergy.  Does not have a history of recurrent UTIs. No vaginal discharge or post menopausal bleeding.  Current Outpatient Prescriptions on File Prior to Visit  Medication Sig Dispense Refill  . albuterol (PROVENTIL HFA;VENTOLIN HFA) 108 (90 BASE) MCG/ACT inhaler Inhale 2 puffs into the lungs every 4 (four) hours as needed.    . AMBULATORY NON FORMULARY MEDICATION Ultra flora IB -- once daily     . beclomethasone (QVAR) 40 MCG/ACT inhaler Inhale 2 puffs into the lungs 2 (two) times daily as needed.     . Cholecalciferol (VITAMIN D PO) Take by mouth.    . clonazePAM (KLONOPIN) 0.5 MG tablet take 1 tablet by mouth three times a day if needed for anxiety 60 tablet 1  . CRESTOR 10 MG tablet take 1 tablet by mouth once daily 30 tablet 5  . fenofibrate 160 MG tablet Take 1 tablet (160 mg total) by mouth daily. 30 tablet 6  . fluticasone (FLONASE) 50 MCG/ACT nasal spray instill 1 to 2 sprays into each nostril daily (Patient taking differently: Use as Directed as Needed) 16 g 3   No current facility-administered medications on file prior to visit.    Allergies  Allergen Reactions  . Penicillins     Throat swelling   . Shellfish Allergy Anaphylaxis    Past Medical History  Diagnosis Date  . Asthma   . GERD (gastroesophageal reflux disease)   . Hay fever   . Hyperlipidemia   . Epilepsy     Past Surgical History  Procedure Laterality Date  . Cholecystectomy    . Breast biopsy      fatty tumor  . Tonsillectomy and adenoidectomy    . Back surgery      x2  . Shoulder surgery      right  . Cesarean section    . Breast enhancement surgery    . Cardiac catheterization  07/2010  . Hand  surgery      carpal tunnel release, cyst excision    Family History  Problem Relation Age of Onset  . Stroke Mother   . Hypertension Mother   . Hyperlipidemia Mother   . Diabetes Mother   . Sudden death Paternal Grandfather 60  . Heart attack Neg Hx     Social History   Social History  . Marital Status: Married    Spouse Name: N/A  . Number of Children: N/A  . Years of Education: N/A   Occupational History  . Not on file.   Social History Main Topics  . Smoking status: Never Smoker   . Smokeless tobacco: Not on file  . Alcohol Use: No  . Drug Use: No  . Sexual Activity: Not on file   Other Topics Concern  . Not on file   Social History Narrative   The PMH, PSH, Social History, Family History, Medications, and allergies have been reviewed in [facility], and have been updated if relevant.  Review of Systems  Constitutional: Positive for fever and fatigue.  Respiratory: Negative.   Cardiovascular: Negative.   Gastrointestinal: Positive for nausea and abdominal pain. Negative for vomiting, diarrhea and constipation.  Genitourinary: Positive for dysuria, urgency, frequency and flank pain. Negative for  hematuria, vaginal bleeding, vaginal discharge, vaginal pain, menstrual problem and pelvic pain.  Musculoskeletal: Negative.   Skin: Negative.   Neurological: Negative.   Psychiatric/Behavioral: Negative.   All other systems reviewed and are negative.   OBJECTIVE: BP 110/82 mmHg  Temp(Src) 98.3 F (36.8 C) (Oral)  Wt 199 lb (90.266 kg)  Physical Exam  Constitutional: She is well-developed, well-nourished, and in no distress. No distress.  HENT:  Head: Normocephalic and atraumatic.  Eyes: Conjunctivae are normal.  Cardiovascular: Normal rate.   Pulmonary/Chest: Effort normal.  Abdominal: Soft. She exhibits no distension and no mass. There is tenderness. There is no rebound and no guarding.  Musculoskeletal: Normal range of motion.  Neurological: She is alert.   Skin: Skin is warm and dry. She is not diaphoretic.  Psychiatric: Mood, memory, affect and judgment normal.  Nursing note and vitals reviewed.

## 2014-11-01 NOTE — Assessment & Plan Note (Addendum)
New- UA positive for RBCs only. Symptoms most consistent with UTI.  Cannot rule out nephrolithiasis. eRx sent for cipro 500 mg twice daily x 3 days. Push fluids. Follow up with PCP on Monday, go to ER if symptoms worsen.  The patient indicates understanding of these issues and agrees with the plan.

## 2014-11-01 NOTE — Progress Notes (Signed)
Pre visit review using our clinic review tool, if applicable. No additional management support is needed unless otherwise documented below in the visit note. 

## 2014-11-01 NOTE — Patient Instructions (Signed)

## 2014-11-02 LAB — URINE CULTURE
Colony Count: NO GROWTH
Organism ID, Bacteria: NO GROWTH

## 2014-11-19 ENCOUNTER — Telehealth: Payer: Self-pay | Admitting: Family Medicine

## 2014-11-19 NOTE — Telephone Encounter (Signed)
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 She is requesting confirmation of receipt. They were faxed on the 08/26 and also 8/29.     Fax #: [phone]

## 2014-11-19 NOTE — Telephone Encounter (Signed)
Called and informed Christie Thomas that we have not received this. When I verified the fax number, she was sending it to our old [place] office. New fax number of [phone] was given. She states she will re-fax now. JG//CMA

## 2014-11-21 NOTE — Telephone Encounter (Signed)
Received and faxed to medical records since they are requesting the last 5 years of records. JG//CMA

## 2014-12-02 NOTE — Telephone Encounter (Signed)
[place] Copy Master called back in requesting records. Informed them of previous note. Provided Medical Records # to contact for records.

## 2015-02-20 ENCOUNTER — Other Ambulatory Visit: Payer: Self-pay | Admitting: Family Medicine

## 2015-02-20 NOTE — Telephone Encounter (Signed)
Medication filled to pharmacy as requested.   

## 2015-02-20 NOTE — Telephone Encounter (Signed)
Last OV 08/28/14 Clonazepam last filled 02/28/14 #60 with 1

## 2015-03-03 ENCOUNTER — Telehealth: Payer: Self-pay | Admitting: Behavioral Health

## 2015-03-03 NOTE — Telephone Encounter (Signed)
Unable to reach patient at time of Pre-Visit Call.  Left message for patient to return call when available.    

## 2015-03-04 ENCOUNTER — Other Ambulatory Visit (HOSPITAL_COMMUNITY)
Admission: RE | Admit: 2015-03-04 | Discharge: 2015-03-04 | Disposition: A | Discharge status: Home / Self Care | Payer: BLUE CROSS/BLUE SHIELD | Source: Ambulatory Visit | Attending: Family Medicine | Admitting: Family Medicine

## 2015-03-04 ENCOUNTER — Encounter: Payer: Self-pay | Admitting: Family Medicine

## 2015-03-04 ENCOUNTER — Ambulatory Visit (INDEPENDENT_AMBULATORY_CARE_PROVIDER_SITE_OTHER): Payer: BLUE CROSS/BLUE SHIELD | Admitting: Family Medicine

## 2015-03-04 ENCOUNTER — Other Ambulatory Visit: Payer: Self-pay | Admitting: Family Medicine

## 2015-03-04 VITALS — BP 112/80 | HR 76 | Temp 97.9°F | Resp 16 | Ht 65.0 in | Wt 200.1 lb

## 2015-03-04 DIAGNOSIS — N951 Menopausal and female climacteric states: Secondary | ICD-10-CM | POA: Insufficient documentation

## 2015-03-04 DIAGNOSIS — Z0001 Encounter for general adult medical examination with abnormal findings: Secondary | ICD-10-CM

## 2015-03-04 DIAGNOSIS — M25572 Pain in left ankle and joints of left foot: Secondary | ICD-10-CM | POA: Diagnosis not present

## 2015-03-04 DIAGNOSIS — R7989 Other specified abnormal findings of blood chemistry: Secondary | ICD-10-CM

## 2015-03-04 DIAGNOSIS — Z124 Encounter for screening for malignant neoplasm of cervix: Secondary | ICD-10-CM | POA: Diagnosis not present

## 2015-03-04 DIAGNOSIS — Z01419 Encounter for gynecological examination (general) (routine) without abnormal findings: Secondary | ICD-10-CM | POA: Insufficient documentation

## 2015-03-04 DIAGNOSIS — Z1151 Encounter for screening for human papillomavirus (HPV): Secondary | ICD-10-CM | POA: Diagnosis present

## 2015-03-04 DIAGNOSIS — Z0000 Encounter for general adult medical examination without abnormal findings: Secondary | ICD-10-CM

## 2015-03-04 DIAGNOSIS — R945 Abnormal results of liver function studies: Secondary | ICD-10-CM

## 2015-03-04 LAB — CBC WITH DIFFERENTIAL/PLATELET
BASOS ABS: 0 10*3/uL (ref 0.0–0.1)
BASOS PCT: 0.7 % (ref 0.0–3.0)
EOS PCT: 5 % (ref 0.0–5.0)
Eosinophils Absolute: 0.4 10*3/uL (ref 0.0–0.7)
HEMATOCRIT: 43.7 % (ref 36.0–46.0)
Hemoglobin: 14.6 g/dL (ref 12.0–15.0)
Lymphocytes Relative: 36.6 % (ref 12.0–46.0)
Lymphs Abs: 2.6 10*3/uL (ref 0.7–4.0)
MCHC: 33.4 g/dL (ref 30.0–36.0)
MCV: 87.2 fl (ref 78.0–100.0)
MONOS PCT: 6.8 % (ref 3.0–12.0)
Monocytes Absolute: 0.5 10*3/uL (ref 0.1–1.0)
NEUTROS ABS: 3.6 10*3/uL (ref 1.4–7.7)
NEUTROS PCT: 50.9 % (ref 43.0–77.0)
PLATELETS: 325 10*3/uL (ref 150.0–400.0)
RBC: 5.01 Mil/uL (ref 3.87–5.11)
RDW: 13.4 % (ref 11.5–15.5)
WBC: 7.1 10*3/uL (ref 4.0–10.5)

## 2015-03-04 LAB — LIPID PANEL
Cholesterol: 139 mg/dL (ref 0–200)
HDL: 61.8 mg/dL
LDL Cholesterol: 60 mg/dL (ref 0–99)
NonHDL: 76.79
Total CHOL/HDL Ratio: 2
Triglycerides: 86 mg/dL (ref 0.0–149.0)
VLDL: 17.2 mg/dL (ref 0.0–40.0)

## 2015-03-04 LAB — BASIC METABOLIC PANEL WITH GFR
BUN: 22 mg/dL (ref 6–23)
CO2: 25 meq/L (ref 19–32)
Calcium: 9.9 mg/dL (ref 8.4–10.5)
Chloride: 106 meq/L (ref 96–112)
Creatinine, Ser: 1.03 mg/dL (ref 0.40–1.20)
GFR: 58.08 mL/min — ABNORMAL LOW
Glucose, Bld: 84 mg/dL (ref 70–99)
Potassium: 3.9 meq/L (ref 3.5–5.1)
Sodium: 140 meq/L (ref 135–145)

## 2015-03-04 LAB — HEPATIC FUNCTION PANEL
ALBUMIN: 4.7 g/dL (ref 3.5–5.2)
ALK PHOS: 74 U/L (ref 39–117)
ALT: 86 U/L — ABNORMAL HIGH (ref 0–35)
AST: 65 U/L — ABNORMAL HIGH (ref 0–37)
BILIRUBIN DIRECT: 0.2 mg/dL (ref 0.0–0.3)
Total Bilirubin: 0.6 mg/dL (ref 0.2–1.2)
Total Protein: 8 g/dL (ref 6.0–8.3)

## 2015-03-04 LAB — VITAMIN D 25 HYDROXY (VIT D DEFICIENCY, FRACTURES): VITD: 39.38 ng/mL (ref 30.00–100.00)

## 2015-03-04 LAB — TSH: TSH: 1.79 u[IU]/mL (ref 0.35–4.50)

## 2015-03-04 MED ORDER — ESTROGENS, CONJUGATED 0.625 MG/GM VA CREA: TOPICAL_CREAM | VAGINAL | Status: DC

## 2015-03-04 NOTE — Progress Notes (Signed)
   Subjective:    Patient ID: Christie Thomas, female    DOB: Sep 17, 1955, 59 y.o.   MRN: [id number]  HPI CPE- overdue on pap.  Due for mammo (has appt scheduled).  UTD on colonoscopy w/ Dr Collene Mares- due 2022     Review of Systems Patient reports no vision/ hearing changes, adenopathy,fever, weight change,  persistant/recurrent hoarseness , swallowing issues, chest pain, palpitations, edema, persistant/recurrent cough, hemoptysis, dyspnea (rest/exertional/paroxysmal nocturnal), gastrointestinal bleeding (melena, rectal bleeding), abdominal pain, significant heartburn, bowel changes, GU symptoms (dysuria, hematuria, incontinence),  syncope, focal weakness, memory loss, numbness & tingling, skin/hair/nail changes, abnormal bruising or bleeding, anxiety, or depression.   L ankle pain and swelling- occuring intermittently x6 months.  Very painful.  Will need ibuprofen and a brace for support.  Unable to walk or exercise due to pain.  +Vaginal Dryness- previously on Premarin    Objective:   Physical Exam  General Appearance:    Alert, cooperative, no distress, appears stated age  Head:    Normocephalic, without obvious abnormality, atraumatic  Eyes:    PERRL, conjunctiva/corneas clear, EOM's intact, fundi    benign, both eyes  Ears:    Normal TM's and external ear canals, both ears  Nose:   Nares normal, septum midline, mucosa normal, no drainage    or sinus tenderness  Throat:   Lips, mucosa, and tongue normal; teeth and gums normal  Neck:   Supple, symmetrical, trachea midline, no adenopathy;    Thyroid: no enlargement/tenderness/nodules  Back:     Symmetric, no curvature, ROM normal, no CVA tenderness  Lungs:     Clear to auscultation bilaterally, respirations unlabored  Chest Wall:    No tenderness or deformity   Heart:    Regular rate and rhythm, S1 and S2 normal, no murmur, rub   or gallop  Breast Exam:    No tenderness, masses, or nipple abnormality  Abdomen:     Soft, non-tender,  bowel sounds active all four quadrants,    no masses, no organomegaly  Genitalia:    External genitalia normal, cervix normal in appearance, no CMT, uterus in normal size and position, adnexa w/out mass or tenderness, mucosa pink and moist, no lesions or discharge present  Rectal:    Normal external appearance  Extremities:   L ankle TTP and swelling over medial malleolus  Pulses:   2+ and symmetric all extremities  Skin:   Skin color, texture, turgor normal, no rashes or lesions  Lymph nodes:   Cervical, supraclavicular, and axillary nodes normal  Neurologic:   CNII-XII intact, normal strength, sensation and reflexes    throughout          Assessment & Plan:

## 2015-03-04 NOTE — Assessment & Plan Note (Signed)
Pt's PE WNL w/ exception of obesity.  Pap collected.  UTD on mammo and colonoscopy.  Declined flu shot.  Check labs.  Anticipatory guidance provided.

## 2015-03-04 NOTE — Patient Instructions (Signed)
Follow up in 6 months to recheck Cholesterol We'll notify you of your lab results and make any changes if needed We'll call you with your Ortho appt for the ankle Restart the Premarin- start w/ daily application and then reduce to twice weekly Call with any questions or concerns If you want to join [place] at the new [place] office, any scheduled appointments will automatically transfer and we will see you at [address] [place] [address] [place], [place] [address] ([place] 03/24/15) Happy Holidays!!!

## 2015-03-04 NOTE — Assessment & Plan Note (Signed)
New.  Pt w/ apparent swelling of bursa inferior to medial malleolus.  Pain is worse w/ ambulation.  Not consistent w/ gout.  Refer to ortho for complete evaluation and tx.

## 2015-03-04 NOTE — Progress Notes (Signed)
Pre visit review using our clinic review tool, if applicable. No additional management support is needed unless otherwise documented below in the visit note. 

## 2015-03-04 NOTE — Assessment & Plan Note (Signed)
Restart Premarin cream at pt's request

## 2015-03-05 LAB — CYTOLOGY - PAP

## 2015-04-08 ENCOUNTER — Encounter: Payer: Self-pay | Admitting: Family Medicine

## 2015-04-08 ENCOUNTER — Ambulatory Visit (INDEPENDENT_AMBULATORY_CARE_PROVIDER_SITE_OTHER): Payer: BLUE CROSS/BLUE SHIELD | Admitting: Family Medicine

## 2015-04-08 VITALS — BP 118/80 | HR 77 | Temp 97.7°F | Ht 65.0 in | Wt 204.0 lb

## 2015-04-08 DIAGNOSIS — R748 Abnormal levels of other serum enzymes: Secondary | ICD-10-CM

## 2015-04-08 LAB — HEPATIC FUNCTION PANEL
ALK PHOS: 75 U/L (ref 39–117)
ALT: 86 U/L — AB (ref 0–35)
AST: 76 U/L — ABNORMAL HIGH (ref 0–37)
Albumin: 4.3 g/dL (ref 3.5–5.2)
BILIRUBIN DIRECT: 0.2 mg/dL (ref 0.0–0.3)
BILIRUBIN TOTAL: 0.6 mg/dL (ref 0.2–1.2)
TOTAL PROTEIN: 7.3 g/dL (ref 6.0–8.3)

## 2015-04-08 NOTE — Progress Notes (Signed)
   Subjective:    Patient ID: Christie Thomas, female    DOB: 1955/08/11, 60 y.o.   MRN: [id number]  HPI Elevated LFTs- pt was noted to have elevated LFTs at time of CPE in Dec.  Currently on Crestor and Fenofibrate.  Pt was started on Voltaren by ortho.  Denies N/V.  No abd pain.   Review of Systems For ROS see HPI     Objective:   Physical Exam  Constitutional: She is oriented to person, place, and time. She appears well-developed and well-nourished. No distress.  HENT:  Head: Normocephalic and atraumatic.  Abdominal: Soft. Bowel sounds are normal. She exhibits no distension. There is no tenderness. There is no rebound and no guarding.  Neurological: She is alert and oriented to person, place, and time.  Skin: Skin is warm and dry.  Psychiatric: She has a normal mood and affect. Her behavior is normal. Thought content normal.  Vitals reviewed.         Assessment & Plan:

## 2015-04-08 NOTE — Patient Instructions (Signed)
Follow up in 6 months to recheck cholesterol We'll notify you of your lab results and make any changes if needed If the liver enzymes are still up, I'll talk to Dr Drema Dallas about the Diclofenac Call with any questions or concerns Happy New Year! Christie Thomas with the move!!!

## 2015-04-08 NOTE — Assessment & Plan Note (Signed)
New.  Noted on labs done at CPE last month.  Interestingly, she is now taking Voltaren which can cause increased liver functions.  Pt has been holding tylenol and ETOH x2 weeks and is here for recheck.  If enzymes have not normalized, will call Dr Drema Dallas and discuss switching NSAID.  Pt expressed understanding and is in agreement w/ plan.

## 2015-04-08 NOTE — Progress Notes (Signed)
Pre visit review using our clinic review tool, if applicable. No additional management support is needed unless otherwise documented below in the visit note. 

## 2015-04-09 LAB — HM MAMMOGRAPHY

## 2015-04-14 ENCOUNTER — Other Ambulatory Visit: Payer: Self-pay | Admitting: Gastroenterology

## 2015-04-14 DIAGNOSIS — R945 Abnormal results of liver function studies: Principal | ICD-10-CM

## 2015-04-14 DIAGNOSIS — R7989 Other specified abnormal findings of blood chemistry: Secondary | ICD-10-CM

## 2015-04-22 ENCOUNTER — Ambulatory Visit
Admission: RE | Admit: 2015-04-22 | Discharge: 2015-04-22 | Disposition: A | Discharge status: Home / Self Care | Payer: BLUE CROSS/BLUE SHIELD | Source: Ambulatory Visit | Attending: Gastroenterology | Admitting: Gastroenterology

## 2015-04-22 DIAGNOSIS — R945 Abnormal results of liver function studies: Principal | ICD-10-CM

## 2015-04-22 DIAGNOSIS — R7989 Other specified abnormal findings of blood chemistry: Secondary | ICD-10-CM

## 2015-04-24 ENCOUNTER — Encounter: Payer: Self-pay | Admitting: General Practice

## 2015-05-07 ENCOUNTER — Other Ambulatory Visit (HOSPITAL_COMMUNITY): Payer: Self-pay | Admitting: Rheumatology

## 2015-05-07 DIAGNOSIS — M25572 Pain in left ankle and joints of left foot: Secondary | ICD-10-CM

## 2015-05-18 ENCOUNTER — Ambulatory Visit (HOSPITAL_COMMUNITY)
Admission: RE | Admit: 2015-05-18 | Discharge: 2015-05-18 | Disposition: A | Discharge status: Home / Self Care | Payer: BLUE CROSS/BLUE SHIELD | Source: Ambulatory Visit | Attending: Rheumatology | Admitting: Rheumatology

## 2015-05-18 DIAGNOSIS — M722 Plantar fascial fibromatosis: Secondary | ICD-10-CM | POA: Insufficient documentation

## 2015-05-18 DIAGNOSIS — M25572 Pain in left ankle and joints of left foot: Secondary | ICD-10-CM | POA: Diagnosis present

## 2015-05-18 DIAGNOSIS — M659 Synovitis and tenosynovitis, unspecified: Secondary | ICD-10-CM | POA: Insufficient documentation

## 2015-05-18 DIAGNOSIS — R938 Abnormal findings on diagnostic imaging of other specified body structures: Secondary | ICD-10-CM | POA: Diagnosis not present

## 2015-07-17 DIAGNOSIS — R945 Abnormal results of liver function studies: Secondary | ICD-10-CM | POA: Diagnosis not present

## 2015-07-17 DIAGNOSIS — M7071 Other bursitis of hip, right hip: Secondary | ICD-10-CM | POA: Diagnosis not present

## 2015-07-17 DIAGNOSIS — E669 Obesity, unspecified: Secondary | ICD-10-CM | POA: Diagnosis not present

## 2015-07-17 DIAGNOSIS — M79671 Pain in right foot: Secondary | ICD-10-CM | POA: Diagnosis not present

## 2015-08-03 DIAGNOSIS — M6702 Short Achilles tendon (acquired), left ankle: Secondary | ICD-10-CM | POA: Diagnosis not present

## 2015-08-03 DIAGNOSIS — M67822 Other specified disorders of synovium, left elbow: Secondary | ICD-10-CM | POA: Diagnosis not present

## 2015-08-03 DIAGNOSIS — M722 Plantar fascial fibromatosis: Secondary | ICD-10-CM | POA: Diagnosis not present

## 2015-08-10 ENCOUNTER — Other Ambulatory Visit: Payer: Self-pay | Admitting: Family Medicine

## 2015-08-10 NOTE — Telephone Encounter (Signed)
Medication filled to pharmacy as requested.   

## 2015-09-02 ENCOUNTER — Ambulatory Visit (INDEPENDENT_AMBULATORY_CARE_PROVIDER_SITE_OTHER): Payer: BLUE CROSS/BLUE SHIELD | Admitting: Family Medicine

## 2015-09-02 ENCOUNTER — Encounter: Payer: Self-pay | Admitting: Family Medicine

## 2015-09-02 VITALS — BP 114/68 | HR 70 | Temp 97.9°F | Resp 17 | Ht 65.0 in | Wt 201.1 lb

## 2015-09-02 DIAGNOSIS — E785 Hyperlipidemia, unspecified: Secondary | ICD-10-CM

## 2015-09-02 LAB — BASIC METABOLIC PANEL
BUN: 16 mg/dL (ref 6–23)
CALCIUM: 9.8 mg/dL (ref 8.4–10.5)
CHLORIDE: 108 meq/L (ref 96–112)
CO2: 26 mEq/L (ref 19–32)
CREATININE: 1 mg/dL (ref 0.40–1.20)
GFR: 60 mL/min — ABNORMAL LOW (ref >60.00)
Glucose, Bld: 92 mg/dL (ref 70–99)
Potassium: 4.1 mEq/L (ref 3.5–5.1)
Sodium: 141 mEq/L (ref 135–145)

## 2015-09-02 LAB — HEPATIC FUNCTION PANEL
ALK PHOS: 77 U/L (ref 39–117)
ALT: 99 U/L — AB (ref 0–35)
AST: 66 U/L — ABNORMAL HIGH (ref 0–37)
Albumin: 4.4 g/dL (ref 3.5–5.2)
BILIRUBIN DIRECT: 0.1 mg/dL (ref 0.0–0.3)
BILIRUBIN TOTAL: 0.6 mg/dL (ref 0.2–1.2)
Total Protein: 7.1 g/dL (ref 6.0–8.3)

## 2015-09-02 LAB — LIPID PANEL
CHOL/HDL RATIO: 3
CHOLESTEROL: 147 mg/dL (ref 0–200)
HDL: 58.5 mg/dL (ref >39.00)
LDL CALC: 59 mg/dL (ref 0–99)
NonHDL: 88.69
TRIGLYCERIDES: 150 mg/dL — AB (ref 0.0–149.0)
VLDL: 30 mg/dL (ref 0.0–40.0)

## 2015-09-02 NOTE — Assessment & Plan Note (Signed)
Chronic problem, tolerating statin w/o difficulty.  Encouraged healthy diet and regular exercise.  Check labs.  Adjust meds prn  

## 2015-09-02 NOTE — Progress Notes (Signed)
   Subjective:    Patient ID: Christie Thomas, female    DOB: June 27, 1955, 60 y.o.   MRN: [id number]  HPI hyperlipidemia- chronic problem for pt.  On Crestor and Fenofibrate daily.  Pt has gained 11 lbs since last appt.  Denies abd pain, N/V.  No CP, SOB, HAs.   Review of Systems For ROS see HPI     Objective:   Physical Exam  Constitutional: She is oriented to person, place, and time. She appears well-developed and well-nourished. No distress.  HENT:  Head: Normocephalic and atraumatic.  Eyes: Conjunctivae and EOM are normal. Pupils are equal, round, and reactive to light.  Neck: Normal range of motion. Neck supple. No thyromegaly present.  Cardiovascular: Normal rate, regular rhythm, normal heart sounds and intact distal pulses.   No murmur heard. Pulmonary/Chest: Effort normal and breath sounds normal. No respiratory distress.  Abdominal: Soft. She exhibits no distension. There is no tenderness.  Musculoskeletal: She exhibits no edema.  Lymphadenopathy:    She has no cervical adenopathy.  Neurological: She is alert and oriented to person, place, and time.  Skin: Skin is warm and dry.  Psychiatric: She has a normal mood and affect. Her behavior is normal.  Vitals reviewed.         Assessment & Plan:

## 2015-09-02 NOTE — Patient Instructions (Signed)
Schedule your complete physical in 6 months We'll notify you of your lab results and make any changes if needed Continue to work on healthy diet and regular exercise- you can do it! Call with any questions or concerns Have a great trip!!!

## 2015-09-02 NOTE — Progress Notes (Signed)
Pre visit review using our clinic review tool, if applicable. No additional management support is needed unless otherwise documented below in the visit note. 

## 2015-10-13 ENCOUNTER — Other Ambulatory Visit: Payer: Self-pay | Admitting: Gastroenterology

## 2015-10-13 DIAGNOSIS — R748 Abnormal levels of other serum enzymes: Secondary | ICD-10-CM | POA: Diagnosis not present

## 2015-10-13 DIAGNOSIS — K219 Gastro-esophageal reflux disease without esophagitis: Secondary | ICD-10-CM | POA: Diagnosis not present

## 2015-10-13 DIAGNOSIS — R1032 Left lower quadrant pain: Secondary | ICD-10-CM | POA: Diagnosis not present

## 2015-10-13 DIAGNOSIS — K5730 Diverticulosis of large intestine without perforation or abscess without bleeding: Secondary | ICD-10-CM | POA: Diagnosis not present

## 2015-10-13 DIAGNOSIS — R1084 Generalized abdominal pain: Secondary | ICD-10-CM

## 2015-10-13 DIAGNOSIS — R509 Fever, unspecified: Secondary | ICD-10-CM

## 2015-10-16 DIAGNOSIS — E559 Vitamin D deficiency, unspecified: Secondary | ICD-10-CM | POA: Diagnosis not present

## 2015-10-16 DIAGNOSIS — N12 Tubulo-interstitial nephritis, not specified as acute or chronic: Secondary | ICD-10-CM | POA: Diagnosis not present

## 2015-10-16 DIAGNOSIS — Z79899 Other long term (current) drug therapy: Secondary | ICD-10-CM | POA: Diagnosis not present

## 2015-10-16 DIAGNOSIS — M79672 Pain in left foot: Secondary | ICD-10-CM | POA: Diagnosis not present

## 2015-10-16 DIAGNOSIS — R5381 Other malaise: Secondary | ICD-10-CM | POA: Diagnosis not present

## 2015-10-16 DIAGNOSIS — R768 Other specified abnormal immunological findings in serum: Secondary | ICD-10-CM | POA: Diagnosis not present

## 2015-10-16 DIAGNOSIS — Z09 Encounter for follow-up examination after completed treatment for conditions other than malignant neoplasm: Secondary | ICD-10-CM | POA: Diagnosis not present

## 2015-10-28 ENCOUNTER — Ambulatory Visit (INDEPENDENT_AMBULATORY_CARE_PROVIDER_SITE_OTHER): Payer: BLUE CROSS/BLUE SHIELD | Admitting: Family Medicine

## 2015-10-28 ENCOUNTER — Encounter: Payer: Self-pay | Admitting: Family Medicine

## 2015-10-28 DIAGNOSIS — K5732 Diverticulitis of large intestine without perforation or abscess without bleeding: Secondary | ICD-10-CM

## 2015-10-28 DIAGNOSIS — K529 Noninfective gastroenteritis and colitis, unspecified: Secondary | ICD-10-CM | POA: Insufficient documentation

## 2015-10-28 DIAGNOSIS — K5289 Other specified noninfective gastroenteritis and colitis: Secondary | ICD-10-CM | POA: Insufficient documentation

## 2015-10-28 NOTE — Progress Notes (Signed)
Pre visit review using our clinic review tool, if applicable. No additional management support is needed unless otherwise documented below in the visit note. 

## 2015-10-28 NOTE — Assessment & Plan Note (Signed)
New.  Pt was dx'd and tx'd by GI.  She is on her last week of Cipro/Flagyl.  Has been able to advance her diet.  Is currently asymptomatic.  Will hold on repeat labs at this time as pt has f/u w/ GI scheduled.  Reviewed supportive care and red flags that should prompt return. Pt expressed understanding and is in agreement w/ plan.

## 2015-10-28 NOTE — Progress Notes (Signed)
   Subjective:    Patient ID: Graelynn Sasseen, female    DOB: [birth year], 60 y.o.   MRN: [id number]  HPI Pt was having abd pain x3 weeks and went to see Dr Collene Mares.  dx'd w/ diverticulitis and 'kidney infxn'.  Had back pain, recurrent fevers, abd pain.  Started on Cipro and Flagyl x1 month, pt has completed 3 of 4 weeks.  Pt is taking daily probiotic.  Pt reports feeling much better- back pain is gone, fever has resolved.  Abdominal pain has resolved.  Diet has advanced.   Review of Systems For ROS see HPI     Objective:   Physical Exam  Constitutional: She is oriented to person, place, and time. She appears well-developed and well-nourished. No distress.  HENT:  Head: Normocephalic and atraumatic.  Cardiovascular: Normal rate, regular rhythm and normal heart sounds.   Pulmonary/Chest: Effort normal and breath sounds normal. No respiratory distress. She has no wheezes. She has no rales.  Abdominal: Soft. Bowel sounds are normal. She exhibits no distension. There is no tenderness. There is no rebound and no guarding.  Neurological: She is alert and oriented to person, place, and time.  Skin: Skin is warm and dry.  Psychiatric: She has a normal mood and affect. Her behavior is normal. Thought content normal.  Vitals reviewed.         Assessment & Plan:

## 2015-10-28 NOTE — Patient Instructions (Signed)
Follow up as needed Continue to drink plenty of fluids Finish the antibiotics as directed I'm so glad that you're feeling better!!! Congrats on your empty nest!!!

## 2015-11-17 DIAGNOSIS — Z79899 Other long term (current) drug therapy: Secondary | ICD-10-CM | POA: Diagnosis not present

## 2015-11-18 LAB — CBC AND DIFFERENTIAL
HEMATOCRIT: 43 % (ref 36–46)
HEMOGLOBIN: 14.4 g/dL (ref 12.0–16.0)
PLATELETS: 292 10*3/uL (ref 150–399)
WBC: 6.6 10^3/mL

## 2015-11-18 LAB — BASIC METABOLIC PANEL
BUN: 17 mg/dL (ref 4–21)
Creatinine: 1.1 mg/dL (ref 0.5–1.1)
Glucose: 102 mg/dL
Potassium: 4.2 mmol/L (ref 3.4–5.3)
SODIUM: 142 mmol/L (ref 137–147)

## 2015-11-18 LAB — HEPATIC FUNCTION PANEL
ALK PHOS: 21 U/L — AB (ref 25–125)
ALT: 46 U/L — AB (ref 7–35)
AST: 42 U/L — AB (ref 13–35)
BILIRUBIN, TOTAL: 0.6 mg/dL

## 2015-12-07 ENCOUNTER — Ambulatory Visit (INDEPENDENT_AMBULATORY_CARE_PROVIDER_SITE_OTHER): Payer: BLUE CROSS/BLUE SHIELD | Admitting: Family Medicine

## 2015-12-07 ENCOUNTER — Encounter: Payer: Self-pay | Admitting: Family Medicine

## 2015-12-07 VITALS — BP 128/84 | HR 83 | Temp 98.5°F | Resp 16 | Ht 65.0 in | Wt 198.2 lb

## 2015-12-07 DIAGNOSIS — J00 Acute nasopharyngitis [common cold]: Secondary | ICD-10-CM

## 2015-12-07 DIAGNOSIS — B9689 Other specified bacterial agents as the cause of diseases classified elsewhere: Secondary | ICD-10-CM

## 2015-12-07 DIAGNOSIS — J208 Acute bronchitis due to other specified organisms: Principal | ICD-10-CM

## 2015-12-07 MED ORDER — DOXYCYCLINE HYCLATE 100 MG PO TABS
100.0000 mg | ORAL_TABLET | Freq: Two times a day (BID) | ORAL | 0 refills | Status: DC
Start: 2015-12-07 — End: 2016-06-21

## 2015-12-07 MED ORDER — BECLOMETHASONE DIPROPIONATE 40 MCG/ACT IN AERS: 2.0000 | INHALATION_SPRAY | Freq: Two times a day (BID) | RESPIRATORY_TRACT | 6 refills | Status: DC | PRN

## 2015-12-07 MED ORDER — IPRATROPIUM-ALBUTEROL 0.5-2.5 (3) MG/3ML IN SOLN
3.0000 mL | Freq: Once | RESPIRATORY_TRACT | Status: AC
  Administered 2015-12-07: 3 mL via RESPIRATORY_TRACT

## 2015-12-07 MED ORDER — IPRATROPIUM-ALBUTEROL 0.5-2.5 (3) MG/3ML IN SOLN: 3.0000 mL | Freq: Four times a day (QID) | RESPIRATORY_TRACT | Status: DC

## 2015-12-07 MED ORDER — ALBUTEROL SULFATE HFA 108 (90 BASE) MCG/ACT IN AERS: 2.0000 | INHALATION_SPRAY | RESPIRATORY_TRACT | 6 refills | Status: DC | PRN

## 2015-12-07 NOTE — Progress Notes (Signed)
   Subjective:    Patient ID: Christie Thomas, female    DOB: 1955-06-15, 60 y.o.   MRN: [id number]  HPI URI- pt was tx'd w/ Zpack 10 days ago on [place] cruise after presenting w/ sore throat, PND, fever and dx'd her w/ sinus infxn.  Pt reports sxs improved but 4 days ago sxs returned w/ productive cough, fever, increased SOB.  Used Qvar and Albuterol- needs refills.  + nasal congestion but no sinus pain/pressure.  [place] home yesterday.  Pt reports this is similar to previous bronchitis.  + nausea.   Review of Systems For ROS see HPI     Objective:   Physical Exam  Constitutional: She is oriented to person, place, and time. She appears well-developed and well-nourished. No distress.  HENT:  Head: Normocephalic and atraumatic.  TMs normal bilaterally No TTP over sinuses Mild nasal congestion Throat w/out erythema, edema, or exudate  Eyes: Conjunctivae and EOM are normal. Pupils are equal, round, and reactive to light.  Neck: Normal range of motion. Neck supple.  Cardiovascular: Normal rate, regular rhythm, normal heart sounds and intact distal pulses.   No murmur heard. Pulmonary/Chest: Effort normal. No respiratory distress. She has wheezes (diffuse expiratory wheezes- improved s/p neb tx in office).  + hacking cough  Lymphadenopathy:    She has no cervical adenopathy.  Neurological: She is alert and oriented to person, place, and time.  Skin: Skin is warm and dry.  Psychiatric: She has a normal mood and affect. Her behavior is normal. Thought content normal.  Vitals reviewed.         Assessment & Plan:

## 2015-12-07 NOTE — Patient Instructions (Signed)
Follow up as needed Start the Doxycycline twice daily- take w/ food Use the Qvar- 2 puffs twice daily- until feeling better Albuterol as needed for cough/shortness of breath Drink plenty of fluids REST! Mucinex DM for cough and chest congestion Call with any questions or concerns Hang in there!!!

## 2015-12-07 NOTE — Assessment & Plan Note (Signed)
Pt's sxs are consistent w/ infxn.  Due to failure of ZPack and recent travel will broaden her coverage to Doxy.  Refill provided on inhalers as pt improved s/p neb tx in office.  Reviewed supportive care and red flags that should prompt return.  Pt expressed understanding and is in agreement w/ plan.

## 2015-12-07 NOTE — Progress Notes (Signed)
Pre visit review using our clinic review tool, if applicable. No additional management support is needed unless otherwise documented below in the visit note. 

## 2015-12-11 ENCOUNTER — Encounter: Payer: Self-pay | Admitting: Family Medicine

## 2016-01-11 ENCOUNTER — Other Ambulatory Visit: Payer: Self-pay | Admitting: Family Medicine

## 2016-01-20 ENCOUNTER — Encounter: Payer: Self-pay | Admitting: *Deleted

## 2016-01-20 DIAGNOSIS — K5792 Diverticulitis of intestine, part unspecified, without perforation or abscess without bleeding: Secondary | ICD-10-CM

## 2016-01-20 DIAGNOSIS — K589 Irritable bowel syndrome without diarrhea: Secondary | ICD-10-CM

## 2016-01-20 DIAGNOSIS — M25472 Effusion, left ankle: Secondary | ICD-10-CM

## 2016-01-20 DIAGNOSIS — M19042 Primary osteoarthritis, left hand: Secondary | ICD-10-CM | POA: Insufficient documentation

## 2016-01-20 DIAGNOSIS — G40909 Epilepsy, unspecified, not intractable, without status epilepticus: Secondary | ICD-10-CM

## 2016-01-20 DIAGNOSIS — K760 Fatty (change of) liver, not elsewhere classified: Secondary | ICD-10-CM

## 2016-01-20 DIAGNOSIS — M19041 Primary osteoarthritis, right hand: Secondary | ICD-10-CM

## 2016-01-20 DIAGNOSIS — T40601A Poisoning by unspecified narcotics, accidental (unintentional), initial encounter: Secondary | ICD-10-CM | POA: Insufficient documentation

## 2016-01-20 DIAGNOSIS — E669 Obesity, unspecified: Secondary | ICD-10-CM | POA: Insufficient documentation

## 2016-01-20 DIAGNOSIS — Z8669 Personal history of other diseases of the nervous system and sense organs: Secondary | ICD-10-CM | POA: Insufficient documentation

## 2016-01-20 DIAGNOSIS — K219 Gastro-esophageal reflux disease without esophagitis: Secondary | ICD-10-CM | POA: Insufficient documentation

## 2016-01-20 DIAGNOSIS — F419 Anxiety disorder, unspecified: Secondary | ICD-10-CM

## 2016-01-20 DIAGNOSIS — K5790 Diverticulosis of intestine, part unspecified, without perforation or abscess without bleeding: Secondary | ICD-10-CM

## 2016-01-20 DIAGNOSIS — M7060 Trochanteric bursitis, unspecified hip: Secondary | ICD-10-CM

## 2016-01-20 DIAGNOSIS — K6389 Other specified diseases of intestine: Secondary | ICD-10-CM | POA: Insufficient documentation

## 2016-01-20 HISTORY — DX: Diverticulitis of intestine, part unspecified, without perforation or abscess without bleeding: K57.92

## 2016-01-20 HISTORY — DX: Anxiety disorder, unspecified: F41.9

## 2016-01-20 HISTORY — DX: Diverticulosis of intestine, part unspecified, without perforation or abscess without bleeding: K57.90

## 2016-01-20 HISTORY — DX: Trochanteric bursitis, unspecified hip: M70.60

## 2016-01-20 HISTORY — DX: Irritable bowel syndrome, unspecified: K58.9

## 2016-01-20 HISTORY — DX: Primary osteoarthritis, right hand: M19.042

## 2016-01-20 HISTORY — DX: Effusion, left ankle: M25.472

## 2016-01-20 HISTORY — DX: Primary osteoarthritis, left hand: M19.041

## 2016-01-20 HISTORY — DX: Obesity, unspecified: E66.9

## 2016-01-20 HISTORY — DX: Fatty (change of) liver, not elsewhere classified: K76.0

## 2016-01-20 HISTORY — DX: Epilepsy, unspecified, not intractable, without status epilepticus: G40.909
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
## 2016-01-21 DIAGNOSIS — R768 Other specified abnormal immunological findings in serum: Secondary | ICD-10-CM | POA: Insufficient documentation

## 2016-01-22 ENCOUNTER — Encounter: Payer: Self-pay | Admitting: Rheumatology

## 2016-01-22 ENCOUNTER — Ambulatory Visit (INDEPENDENT_AMBULATORY_CARE_PROVIDER_SITE_OTHER): Payer: BLUE CROSS/BLUE SHIELD | Admitting: Rheumatology

## 2016-01-22 VITALS — BP 126/66 | HR 75 | Resp 13 | Ht 64.5 in | Wt 201.0 lb

## 2016-01-22 DIAGNOSIS — M25572 Pain in left ankle and joints of left foot: Secondary | ICD-10-CM | POA: Diagnosis not present

## 2016-01-22 DIAGNOSIS — M19042 Primary osteoarthritis, left hand: Secondary | ICD-10-CM | POA: Diagnosis not present

## 2016-01-22 DIAGNOSIS — R768 Other specified abnormal immunological findings in serum: Secondary | ICD-10-CM | POA: Diagnosis not present

## 2016-01-22 DIAGNOSIS — Z79899 Other long term (current) drug therapy: Secondary | ICD-10-CM

## 2016-01-22 DIAGNOSIS — M25472 Effusion, left ankle: Secondary | ICD-10-CM | POA: Diagnosis not present

## 2016-01-22 DIAGNOSIS — M19041 Primary osteoarthritis, right hand: Secondary | ICD-10-CM

## 2016-01-22 LAB — CBC WITH DIFFERENTIAL/PLATELET
BASOS ABS: 60 {cells}/uL (ref 0–200)
Basophils Relative: 1 %
EOS ABS: 360 {cells}/uL (ref 15–500)
Eosinophils Relative: 6 %
HEMATOCRIT: 42.3 % (ref 35.0–45.0)
HEMOGLOBIN: 14 g/dL (ref 11.7–15.5)
LYMPHS ABS: 1920 {cells}/uL (ref 850–3900)
Lymphocytes Relative: 32 %
MCH: 29.3 pg (ref 27.0–33.0)
MCHC: 33.1 g/dL (ref 32.0–36.0)
MCV: 88.5 fL (ref 80.0–100.0)
MONO ABS: 540 {cells}/uL (ref 200–950)
MPV: 9 fL (ref 7.5–12.5)
Monocytes Relative: 9 %
NEUTROS ABS: 3120 {cells}/uL (ref 1500–7800)
NEUTROS PCT: 52 %
Platelets: 301 10*3/uL (ref 140–400)
RBC: 4.78 MIL/uL (ref 3.80–5.10)
RDW: 13.4 % (ref 11.0–15.0)
WBC: 6 10*3/uL (ref 3.8–10.8)

## 2016-01-22 MED ORDER — HYDROXYCHLOROQUINE SULFATE 200 MG PO TABS: ORAL_TABLET | ORAL | 1 refills | Status: DC

## 2016-01-22 NOTE — Assessment & Plan Note (Addendum)
Swelling resolved; Responded well w/ plq 200mg  bid monday-friday

## 2016-01-23 LAB — COMPLETE METABOLIC PANEL WITH GFR
ALBUMIN: 4.3 g/dL (ref 3.6–5.1)
ALK PHOS: 67 U/L (ref 33–130)
ALT: 86 U/L — ABNORMAL HIGH (ref 6–29)
AST: 79 U/L — ABNORMAL HIGH (ref 10–35)
BILIRUBIN TOTAL: 0.5 mg/dL (ref 0.2–1.2)
BUN: 17 mg/dL (ref 7–25)
CALCIUM: 9.6 mg/dL (ref 8.6–10.4)
CO2: 23 mmol/L (ref 20–31)
Chloride: 108 mmol/L (ref 98–110)
Creat: 1.17 mg/dL — ABNORMAL HIGH (ref 0.50–0.99)
GFR, EST NON AFRICAN AMERICAN: 51 mL/min — AB (ref >=60)
GFR, Est African American: 59 mL/min — ABNORMAL LOW (ref >=60)
GLUCOSE: 100 mg/dL — AB (ref 65–99)
POTASSIUM: 4.1 mmol/L (ref 3.5–5.3)
Sodium: 140 mmol/L (ref 135–146)
TOTAL PROTEIN: 7 g/dL (ref 6.1–8.1)

## 2016-01-24 NOTE — Progress Notes (Signed)
Elevated creatinine and deep decreased GFR Abnormal Kidney Function:  Tell pt: "Do not use any NSAIDS like Advil, Ibuprofen, Motrin, Mobic, etc.  Also avoid ETOH if using ETOH.(aka: alcohol). We will recheck creatinine in 1 month." " Please drink ADEQUATE AMOUNT OF WATER during waking hours"   Elevated LFT 's    Since AST & ALT are elevated, consider stopping or decreasing any of the following that you may be taking if you can: Alcohol and or Tylenol (and products containing Tylenol)and NSAIDS.  Let us know if you are using cholesterol medications.  Advise pt we should recheck in 4 weeks due to elevated LFTs.

## 2016-01-25 ENCOUNTER — Telehealth: Payer: Self-pay | Admitting: Radiology

## 2016-01-25 NOTE — Telephone Encounter (Signed)
Told her also she can reply in Mychart, sent her the labs in my chart

## 2016-01-25 NOTE — Telephone Encounter (Signed)
Called patient to advise of lab results left message for her to call me back

## 2016-01-25 NOTE — Telephone Encounter (Signed)
-----   Message from [place], [place] sent at 01/24/2016  7:06 PM EST ----- Elevated creatinine and deep decreased GFR Abnormal Kidney Function:  Tell pt: "Do not use any NSAIDS like Advil, Ibuprofen, Motrin, Mobic, etc.  Also avoid ETOH if using ETOH.(aka: alcohol). We will recheck creatinine in 1 month." " Please drink ADEQUATE AMOUNT OF WATER during waking hours"   Elevated LFT 's    Since AST & ALT are elevated, consider stopping or decreasing any of the following that you may be taking if you can: Alcohol and or Tylenol (and products containing Tylenol)and NSAIDS.  Let us know if you are using cholesterol medications.  Advise pt we should recheck in 4 weeks due to elevated LFTs.

## 2016-02-18 ENCOUNTER — Other Ambulatory Visit: Payer: Self-pay | Admitting: Family Medicine

## 2016-02-18 NOTE — Telephone Encounter (Signed)
Last OV 12/07/15 (bronchitis) Clonazepam last filled 02/20/15 #60 with 1  CSC signed, no UDS

## 2016-02-18 NOTE — Telephone Encounter (Signed)
Called prescription in to pharmacy 

## 2016-03-04 ENCOUNTER — Encounter: Payer: BLUE CROSS/BLUE SHIELD | Admitting: Family Medicine

## 2016-04-17 ENCOUNTER — Other Ambulatory Visit: Payer: Self-pay | Admitting: Family Medicine

## 2016-05-18 DIAGNOSIS — Z79899 Other long term (current) drug therapy: Secondary | ICD-10-CM | POA: Diagnosis not present

## 2016-06-15 DIAGNOSIS — Z79899 Other long term (current) drug therapy: Secondary | ICD-10-CM | POA: Insufficient documentation
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
## 2016-06-21 ENCOUNTER — Encounter: Payer: Self-pay | Admitting: Rheumatology

## 2016-06-21 ENCOUNTER — Ambulatory Visit (INDEPENDENT_AMBULATORY_CARE_PROVIDER_SITE_OTHER): Payer: BLUE CROSS/BLUE SHIELD | Admitting: Rheumatology

## 2016-06-21 VITALS — BP 114/72 | HR 78 | Resp 16 | Ht 64.5 in | Wt 202.0 lb

## 2016-06-21 DIAGNOSIS — Z6834 Body mass index (BMI) 34.0-34.9, adult: Secondary | ICD-10-CM

## 2016-06-21 DIAGNOSIS — M25572 Pain in left ankle and joints of left foot: Secondary | ICD-10-CM | POA: Diagnosis not present

## 2016-06-21 DIAGNOSIS — Z79899 Other long term (current) drug therapy: Secondary | ICD-10-CM | POA: Diagnosis not present

## 2016-06-21 DIAGNOSIS — M19041 Primary osteoarthritis, right hand: Secondary | ICD-10-CM

## 2016-06-21 DIAGNOSIS — G8929 Other chronic pain: Secondary | ICD-10-CM | POA: Diagnosis not present

## 2016-06-21 DIAGNOSIS — M25511 Pain in right shoulder: Secondary | ICD-10-CM | POA: Diagnosis not present

## 2016-06-21 DIAGNOSIS — M0609 Rheumatoid arthritis without rheumatoid factor, multiple sites: Secondary | ICD-10-CM | POA: Diagnosis not present

## 2016-06-21 DIAGNOSIS — M19042 Primary osteoarthritis, left hand: Secondary | ICD-10-CM

## 2016-06-21 DIAGNOSIS — E6609 Other obesity due to excess calories: Secondary | ICD-10-CM | POA: Diagnosis not present

## 2016-06-21 LAB — COMPLETE METABOLIC PANEL WITH GFR
ALBUMIN: 4.6 g/dL (ref 3.6–5.1)
ALK PHOS: 77 U/L (ref 33–130)
ALT: 56 U/L — ABNORMAL HIGH (ref 6–29)
AST: 53 U/L — ABNORMAL HIGH (ref 10–35)
BILIRUBIN TOTAL: 0.6 mg/dL (ref 0.2–1.2)
BUN: 20 mg/dL (ref 7–25)
CALCIUM: 10 mg/dL (ref 8.6–10.4)
CO2: 24 mmol/L (ref 20–31)
Chloride: 107 mmol/L (ref 98–110)
Creat: 1.03 mg/dL — ABNORMAL HIGH (ref 0.50–0.99)
GFR, EST AFRICAN AMERICAN: 68 mL/min (ref >=60)
GFR, EST NON AFRICAN AMERICAN: 59 mL/min — AB (ref >=60)
Glucose, Bld: 85 mg/dL (ref 65–99)
POTASSIUM: 4.3 mmol/L (ref 3.5–5.3)
Sodium: 141 mmol/L (ref 135–146)
TOTAL PROTEIN: 7.4 g/dL (ref 6.1–8.1)

## 2016-06-21 LAB — CBC WITH DIFFERENTIAL/PLATELET
BASOS ABS: 64 {cells}/uL (ref 0–200)
Basophils Relative: 1 %
EOS ABS: 320 {cells}/uL (ref 15–500)
Eosinophils Relative: 5 %
HEMATOCRIT: 44.3 % (ref 35.0–45.0)
HEMOGLOBIN: 14.7 g/dL (ref 11.7–15.5)
LYMPHS ABS: 2304 {cells}/uL (ref 850–3900)
Lymphocytes Relative: 36 %
MCH: 29.1 pg (ref 27.0–33.0)
MCHC: 33.2 g/dL (ref 32.0–36.0)
MCV: 87.5 fL (ref 80.0–100.0)
MONO ABS: 640 {cells}/uL (ref 200–950)
MPV: 9.4 fL (ref 7.5–12.5)
Monocytes Relative: 10 %
NEUTROS ABS: 3072 {cells}/uL (ref 1500–7800)
NEUTROS PCT: 48 %
Platelets: 297 10*3/uL (ref 140–400)
RBC: 5.06 MIL/uL (ref 3.80–5.10)
RDW: 13.6 % (ref 11.0–15.0)
WBC: 6.4 10*3/uL (ref 3.8–10.8)

## 2016-06-21 NOTE — Patient Instructions (Addendum)
Standing Labs We placed an order today for your standing lab work.    Please come back and get your standing labs in September and every 5 months  We have open lab Monday through Friday from 8:30-11:30 AM and 1:30-4 PM at the office of Dr. Tresa Moore, PA.   The office is located at [address] [place], [place], [place] [address] No appointment is necessary.   Labs are drawn by [facility].  You may receive a bill from [facility] for your lab work.   Supplements for OA Natural anti-inflammatories  You can purchase these at [employer], [employer] or online.  . Turmeric (capsules)  . Ginger (ginger root or capsules)  . Omega 3 (Fish, flax seeds, chia seeds, walnuts, almonds)  . Tart cherry (dried or extract)   Patient should be under the care of a physician while taking these supplements. This may not be reproduced without the permission of Dr. Bo Merino.

## 2016-06-21 NOTE — Progress Notes (Signed)
Labs stable

## 2016-07-04 ENCOUNTER — Encounter: Payer: Self-pay | Admitting: Family Medicine

## 2016-07-04 ENCOUNTER — Other Ambulatory Visit: Payer: Self-pay | Admitting: Family Medicine

## 2016-07-04 ENCOUNTER — Ambulatory Visit (INDEPENDENT_AMBULATORY_CARE_PROVIDER_SITE_OTHER): Payer: BLUE CROSS/BLUE SHIELD | Admitting: Family Medicine

## 2016-07-04 VITALS — BP 116/80 | HR 74 | Temp 98.1°F | Resp 16 | Ht 65.0 in | Wt 204.1 lb

## 2016-07-04 DIAGNOSIS — E785 Hyperlipidemia, unspecified: Secondary | ICD-10-CM

## 2016-07-04 DIAGNOSIS — Z0000 Encounter for general adult medical examination without abnormal findings: Secondary | ICD-10-CM

## 2016-07-04 DIAGNOSIS — E2839 Other primary ovarian failure: Secondary | ICD-10-CM

## 2016-07-04 DIAGNOSIS — J0100 Acute maxillary sinusitis, unspecified: Secondary | ICD-10-CM | POA: Diagnosis not present

## 2016-07-04 DIAGNOSIS — R430 Anosmia: Secondary | ICD-10-CM

## 2016-07-04 LAB — CBC WITH DIFFERENTIAL/PLATELET
BASOS ABS: 0 10*3/uL (ref 0.0–0.1)
Basophils Relative: 0.2 % (ref 0.0–3.0)
Eosinophils Absolute: 0.4 10*3/uL (ref 0.0–0.7)
Eosinophils Relative: 6.4 % — ABNORMAL HIGH (ref 0.0–5.0)
HCT: 44.9 % (ref 36.0–46.0)
Hemoglobin: 15.2 g/dL — ABNORMAL HIGH (ref 12.0–15.0)
LYMPHS ABS: 2.3 10*3/uL (ref 0.7–4.0)
Lymphocytes Relative: 35.9 % (ref 12.0–46.0)
MCHC: 33.9 g/dL (ref 30.0–36.0)
MCV: 86.9 fl (ref 78.0–100.0)
MONO ABS: 0.6 10*3/uL (ref 0.1–1.0)
Monocytes Relative: 8.8 % (ref 3.0–12.0)
NEUTROS PCT: 48.7 % (ref 43.0–77.0)
Neutro Abs: 3.1 10*3/uL (ref 1.4–7.7)
PLATELETS: 298 10*3/uL (ref 150.0–400.0)
RBC: 5.16 Mil/uL — AB (ref 3.87–5.11)
RDW: 13.5 % (ref 11.5–15.5)
WBC: 6.4 10*3/uL (ref 4.0–10.5)

## 2016-07-04 LAB — HEPATIC FUNCTION PANEL
ALBUMIN: 4.5 g/dL (ref 3.5–5.2)
ALT: 53 U/L — ABNORMAL HIGH (ref 0–35)
AST: 47 U/L — AB (ref 0–37)
Alkaline Phosphatase: 80 U/L (ref 39–117)
Bilirubin, Direct: 0.1 mg/dL (ref 0.0–0.3)
TOTAL PROTEIN: 7.3 g/dL (ref 6.0–8.3)
Total Bilirubin: 0.6 mg/dL (ref 0.2–1.2)

## 2016-07-04 LAB — BASIC METABOLIC PANEL
BUN: 16 mg/dL (ref 6–23)
CO2: 24 meq/L (ref 19–32)
Calcium: 9.9 mg/dL (ref 8.4–10.5)
Chloride: 108 mEq/L (ref 96–112)
Creatinine, Ser: 0.9 mg/dL (ref 0.40–1.20)
GFR: 67.57 mL/min (ref >60.00)
GLUCOSE: 98 mg/dL (ref 70–99)
POTASSIUM: 4.4 meq/L (ref 3.5–5.1)
Sodium: 141 mEq/L (ref 135–145)

## 2016-07-04 LAB — TSH: TSH: 2.29 u[IU]/mL (ref 0.35–4.50)

## 2016-07-04 LAB — LIPID PANEL
CHOLESTEROL: 159 mg/dL (ref 0–200)
HDL: 62.7 mg/dL (ref >39.00)
LDL Cholesterol: 74 mg/dL (ref 0–99)
NonHDL: 96.61
Total CHOL/HDL Ratio: 3
Triglycerides: 115 mg/dL (ref 0.0–149.0)
VLDL: 23 mg/dL (ref 0.0–40.0)

## 2016-07-04 MED ORDER — DOXYCYCLINE HYCLATE 100 MG PO TABS: 100.0000 mg | ORAL_TABLET | Freq: Two times a day (BID) | ORAL | 0 refills | Status: DC

## 2016-07-04 NOTE — Patient Instructions (Addendum)
Follow up in 6 months to recheck cholesterol We'll notify you of your lab results and make any changes if needed Continue to work on healthy diet and regular exercise- you can do it! Call and schedule your mammo at your convenience We'll call you with your bone density appt Start the Doxy twice daily for the sinus infection- take w/ food We'll call you with your ENT appt for the sense of smell Call with any questions or concerns [place]!
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
## 2016-07-04 NOTE — Progress Notes (Signed)
Pre visit review using our clinic review tool, if applicable. No additional management support is needed unless otherwise documented below in the visit note. 

## 2016-07-05 NOTE — Assessment & Plan Note (Signed)
Chronic problem.  Tolerating statin w/o difficulty.  Check labs.  Adjust meds prn  

## 2016-07-05 NOTE — Assessment & Plan Note (Signed)
Pt's PE WNL w/ exception of obesity.  UTD on colonoscopy and pap.  Due for mammo and DEXA- orders placed.  UTD on immunizations.  Check labs.  Anticipatory guidance provided.

## 2016-08-02 DIAGNOSIS — J343 Hypertrophy of nasal turbinates: Secondary | ICD-10-CM | POA: Diagnosis not present

## 2016-08-02 DIAGNOSIS — J309 Allergic rhinitis, unspecified: Secondary | ICD-10-CM | POA: Diagnosis not present

## 2016-08-02 DIAGNOSIS — R430 Anosmia: Secondary | ICD-10-CM | POA: Diagnosis not present

## 2016-08-04 ENCOUNTER — Other Ambulatory Visit: Payer: Self-pay | Admitting: Otolaryngology

## 2016-08-04 ENCOUNTER — Other Ambulatory Visit: Payer: Self-pay | Admitting: Family Medicine

## 2016-08-04 DIAGNOSIS — R430 Anosmia: Secondary | ICD-10-CM

## 2016-08-21 ENCOUNTER — Telehealth: Payer: BLUE CROSS/BLUE SHIELD | Admitting: Family

## 2016-08-21 DIAGNOSIS — R05 Cough: Secondary | ICD-10-CM

## 2016-08-21 DIAGNOSIS — R059 Cough, unspecified: Secondary | ICD-10-CM

## 2016-08-21 DIAGNOSIS — J45901 Unspecified asthma with (acute) exacerbation: Secondary | ICD-10-CM

## 2016-08-21 MED ORDER — PREDNISONE 10 MG (21) PO TBPK: ORAL_TABLET | ORAL | 0 refills | Status: DC

## 2016-08-21 MED ORDER — AZITHROMYCIN 250 MG PO TABS: ORAL_TABLET | ORAL | 0 refills | Status: DC

## 2016-08-21 MED ORDER — BENZONATATE 100 MG PO CAPS: 100.0000 mg | ORAL_CAPSULE | Freq: Three times a day (TID) | ORAL | 0 refills | Status: DC | PRN

## 2016-08-21 NOTE — Progress Notes (Signed)
We are sorry that you are not feeling well.  Here is how we plan to help!  Based on what you have shared with me it looks like you have upper respiratory tract inflammation that has resulted in a significant cough.  Inflammation and infection in the upper respiratory tract is commonly called bronchitis and has four common causes:  Allergies, Viral Infections, Acid Reflux and Bacterial Infections.  Allergies, viruses and acid reflux are treated by controlling symptoms or eliminating the cause. An example might be a cough caused by taking certain blood pressure medications. You stop the cough by changing the medication. Another example might be a cough caused by acid reflux. Controlling the reflux helps control the cough.  Based on your presentation I believe you most likely have A cough due to bacteria.  When patients have a fever and a productive cough with a change in color or increased sputum production, we are concerned about bacterial bronchitis.  If left untreated it can progress to pneumonia.  If your symptoms do not improve with your treatment plan it is important that you contact your provider.   I have prescribed Azithromyin 250 mg: two tables now and then one tablet daily for 4 additonal days    In addition you may use A non-prescription cough medication called Robitussin DAC. Take 2 teaspoons every 8 hours or Delsym: take 2 teaspoons every 12 hours., A non-prescription cough medication called Mucinex DM: take 2 tablets every 12 hours. and A prescription cough medication called Tessalon Perles 100mg . You may take 1-2 capsules every 8 hours as needed for your cough.  Sterapred 10 mg dosepak  USE OF BRONCHODILATOR ("RESCUE") INHALERS: There is a risk from using your bronchodilator too frequently.  The risk is that over-reliance on a medication which only relaxes the muscles surrounding the breathing tubes can reduce the effectiveness of medications prescribed to reduce swelling and congestion of  the tubes themselves.  Although you feel brief relief from the bronchodilator inhaler, your asthma may actually be worsening with the tubes becoming more swollen and filled with mucus.  This can delay other crucial treatments, such as oral steroid medications. If you need to use a bronchodilator inhaler daily, several times per day, you should discuss this with your provider.  There are probably better treatments that could be used to keep your asthma under control.     HOME CARE . Only take medications as instructed by your medical team. . Complete the entire course of an antibiotic. . Drink plenty of fluids and get plenty of rest. . Avoid close contacts especially the very young and the elderly . Cover your mouth if you cough or cough into your sleeve. . Always remember to wash your hands . A steam or ultrasonic humidifier can help congestion.   GET HELP RIGHT AWAY IF: . You develop worsening fever. . You become short of breath . You cough up blood. . Your symptoms persist after you have completed your treatment plan MAKE SURE YOU   Understand these instructions.  Will watch your condition.  Will get help right away if you are not doing well or get worse.  Your e-visit answers were reviewed by a board certified advanced clinical practitioner to complete your personal care plan.  Depending on the condition, your plan could have included both over the counter or prescription medications. If there is a problem please reply  once you have received a response from your provider. Your safety is important to [place].  If you have drug allergies check your prescription carefully.    You can use MyChart to ask questions about today's visit, request a non-urgent call back, or ask for a work or school excuse for 24 hours related to this e-Visit. If it has been greater than 24 hours you will need to follow up with your provider, or enter a new e-Visit to address those concerns. You will get an e-mail  in the next two days asking about your experience.  I hope that your e-visit has been valuable and will speed your recovery. Thank you for using e-visits.

## 2016-08-22 ENCOUNTER — Ambulatory Visit (INDEPENDENT_AMBULATORY_CARE_PROVIDER_SITE_OTHER): Payer: BLUE CROSS/BLUE SHIELD | Admitting: Medical

## 2016-08-22 ENCOUNTER — Ambulatory Visit (HOSPITAL_BASED_OUTPATIENT_CLINIC_OR_DEPARTMENT_OTHER)
Admission: RE | Admit: 2016-08-22 | Discharge: 2016-08-22 | Disposition: A | Discharge status: Home / Self Care | Payer: BLUE CROSS/BLUE SHIELD | Source: Ambulatory Visit | Attending: Medical | Admitting: Medical

## 2016-08-22 VITALS — BP 123/68 | HR 73 | Temp 98.0°F | Ht 65.0 in | Wt 204.6 lb

## 2016-08-22 DIAGNOSIS — J0100 Acute maxillary sinusitis, unspecified: Secondary | ICD-10-CM | POA: Diagnosis not present

## 2016-08-22 DIAGNOSIS — J40 Bronchitis, not specified as acute or chronic: Secondary | ICD-10-CM | POA: Diagnosis not present

## 2016-08-22 DIAGNOSIS — R062 Wheezing: Secondary | ICD-10-CM

## 2016-08-22 DIAGNOSIS — R05 Cough: Secondary | ICD-10-CM | POA: Insufficient documentation

## 2016-08-22 MED ORDER — DOXYCYCLINE HYCLATE 100 MG PO TABS: 100.0000 mg | ORAL_TABLET | Freq: Two times a day (BID) | ORAL | 0 refills | Status: DC

## 2016-08-22 MED ORDER — METHYLPREDNISOLONE ACETATE 40 MG/ML IJ SUSP
40.0000 mg | Freq: Once | INTRAMUSCULAR | Status: AC
  Administered 2016-08-22: 40 mg via INTRAMUSCULAR

## 2016-08-22 NOTE — Patient Instructions (Addendum)
You appear to have bronchitis and sinusitis(more bronchitis). Rest hydrate and tylenol for fever. Continue your benzonatate UC wrote. For infection rx doxycyline.  For your nasal congestion rx flonase. Not to use zpack UC wrote.  Continue you inhalers and do start 6 day taper prednisone.  If by Thursday you wheezing not much improved then please get cxr. Future order placed.  Follow up in 7-10 days or as needed   Carmilla Granville, Percell Miller, [employer]

## 2016-08-22 NOTE — Progress Notes (Signed)
Subjective:    Patient ID: Christie Thomas, female    DOB: [birth year], 61 y.o.   MRN: [id number]  HPI  Pt states almost sick two 2 weeks.  2 weeks ago mild low grade fever and faint nasal congestion. But 5 days ago got very tender sinus frontal and maxillary sinus.   Pt feels her lungs getting tight. She has been wheezing on and off for 5 days.  Pt having more cough that is productive recently. Some fevers and sweats. Some fatigue.  On review pt tdap is up to date.  Pt states feels like bronchitis. Not like pneumonia which she has had before. Describe more bronchitis.  Pt had e-visit last night. She has not filled any meds. Pt rx'd stera pred dose pack and azithromycin. Pt states in past has taken zpack and no reaction. Pt state on cruise took zpack and she did not have any side effects.(pharmacy recently told her old allergy to emycin. Pt not sure what effect that was).      Review of Systems  Constitutional: Positive for fever. Negative for chills and fatigue.  HENT: Positive for congestion, sinus pain and sinus pressure.   Respiratory: Positive for cough and wheezing. Negative for shortness of breath.   Cardiovascular: Negative for chest pain and palpitations.  Gastrointestinal: Negative for abdominal pain and blood in stool.  Neurological: Negative for dizziness, seizures, weakness, light-headedness and numbness.  Hematological: Negative for adenopathy. Does not bruise/bleed easily.    Past Medical History:  Diagnosis Date  . Anxiety 01/20/2016  . Asthma   . Diverticulitis 01/20/2016  . Diverticulosis 01/20/2016  . Epilepsy ([place])   . Epilepsy ([place]) 01/20/2016   Adolescent   . Fatty liver 01/20/2016  . GERD (gastroesophageal reflux disease)   . Hay fever   . Hyperlipidemia   . IBS (irritable bowel syndrome) 01/20/2016  . Obesity 01/20/2016  . Osteoarthritis of both hands 01/20/2016  . Swelling of ankle joint, left 01/20/2016  . Trochanteric bursitis 01/20/2016     Social History   Social History  . Marital status: Married    Spouse name: N/A  . Number of children: N/A  . Years of education: N/A   Occupational History  . Not on file.   Social History Main Topics  . Smoking status: Never Smoker  . Smokeless tobacco: Never Used  . Alcohol use No  . Drug use: No  . Sexual activity: Not on file   Other Topics Concern  . Not on file   Social History Narrative  . No narrative on file    Past Surgical History:  Procedure Laterality Date  . BACK SURGERY     x2  . BREAST BIOPSY     fatty tumor  . BREAST ENHANCEMENT SURGERY    . CARDIAC CATHETERIZATION  07/2010  . CARPAL TUNNEL RELEASE    . CESAREAN SECTION    . CHOLECYSTECTOMY    . HAND SURGERY     carpal tunnel release, cyst excision  . SHOULDER SURGERY     right  . TONSILLECTOMY AND ADENOIDECTOMY      Family History  Problem Relation Age of Onset  . Stroke Mother   . Hypertension Mother   . Hyperlipidemia Mother   . Diabetes Mother   . Cancer Mother   . Sudden death Paternal Grandfather 35  . Alzheimer's disease Father   . Heart attack Neg Hx     Allergies  Allergen Reactions  . Penicillins     Throat  swelling   . Shellfish Allergy Anaphylaxis  . Reglan [Metoclopramide]     Current Outpatient Prescriptions on File Prior to Visit  Medication Sig Dispense Refill  . albuterol (PROVENTIL HFA;VENTOLIN HFA) 108 (90 Base) MCG/ACT inhaler Inhale 2 puffs into the lungs every 4 (four) hours as needed. 1 Inhaler 6  . AMBULATORY NON FORMULARY MEDICATION Ultra flora IB -- once daily     . azithromycin (ZITHROMAX) 250 MG tablet Take 500 mg once, then 250 mg for four days 6 tablet 0  . benzonatate (TESSALON PERLES) 100 MG capsule Take 1 capsule (100 mg total) by mouth 3 (three) times daily as needed. 20 capsule 0  . Cholecalciferol (VITAMIN D PO) Take by mouth.    . clonazePAM (KLONOPIN) 0.5 MG tablet take 1 tablet by mouth three times a day if needed for anxiety 60 tablet 1   . diclofenac (VOLTAREN) 75 MG EC tablet Take 75 mg by mouth 2 (two) times daily.    Marland Kitchen doxycycline (VIBRA-TABS) 100 MG tablet Take 1 tablet (100 mg total) by mouth 2 (two) times daily. 20 tablet 0  . fenofibrate 160 MG tablet take 1 tablet by mouth once daily with food 30 tablet 6  . fluticasone (FLONASE) 50 MCG/ACT nasal spray instill 1 to 2 sprays into each nostril daily (Patient taking differently: Use as Directed as Needed) 16 g 3  . hydroxychloroquine (PLAQUENIL) 200 MG tablet 200mg  BID Monday -Friday only 135 tablet 1  . predniSONE (STERAPRED UNI-PAK 21 TAB) 10 MG (21) TBPK tablet Use as directed 21 tablet 0  . QVAR 40 MCG/ACT inhaler inhale 2 puffs INTO THE LUNGS BY MOUTH twice a day 8.7 g 6  . rosuvastatin (CRESTOR) 10 MG tablet take 1 tablet by mouth once daily 30 tablet 5  . TURMERIC CURCUMIN PO Take 500 mg by mouth 2 (two) times daily.     No current facility-administered medications on file prior to visit.     BP 123/68   Pulse 73   Temp 98 F (36.7 C)   Ht 5\' 5"  (1.651 m)   Wt 204 lb 9.6 oz (92.8 kg)   SpO2 98%   BMI 34.05 kg/m       Objective:   Physical Exam   General  Mental Status - Alert. General Appearance - Well groomed. Not in acute distress.  Skin Rashes- No Rashes.  HEENT Head- Normal. Ear Auditory Canal - Left- Normal. Right - Normal.Tympanic Membrane- Left- Normal. Right- Normal. Eye Sclera/Conjunctiva- Left- Normal. Right- Normal. Nose & Sinuses Nasal Mucosa- Left-  Boggy and Congested. Right-  Boggy and  Congested.Bilateral maxillary and frontal sinus pressure. Mouth & Throat Lips: Upper Lip- Normal: no dryness, cracking, pallor, cyanosis, or vesicular eruption. Lower Lip-Normal: no dryness, cracking, pallor, cyanosis or vesicular eruption. Buccal Mucosa- Bilateral- No Aphthous ulcers. Oropharynx- No Discharge or Erythema. +pnd Tonsils: Characteristics- Bilateral- No Erythema or Congestion. Size/Enlargement- Bilateral- No enlargement.  Discharge- bilateral-None.  Neck Neck- Supple. No Masses.   Chest and Lung Exam Auscultation: Breath Sounds:- even and unlabored. But faint expiratory wheeze scattered.  Cardiovascular Auscultation:Rythm- Regular, rate and rhythm. Murmurs & Other Heart Sounds:Ausculatation of the heart reveal- No Murmurs.  Lymphatic Head & Neck General Head & Neck Lymphatics: Bilateral: Description- No Localized lymphadenopathy.      Assessment & Plan:  You appear to have bronchitis and sinusitis(more bronchitis). Rest hydrate and tylenol for fever. Continue your benzonatate UC wrote. For infection rx doxycyline.  For your nasal congestion rx flonase. Not  to use zpack UC wrote.  Continue you inhalers and do start 6 day taper prednisone.  If by Thursday you wheezing not much improved then please get cxr. Future order placed.  Follow up in 7-10 days or as needed

## 2016-08-23 ENCOUNTER — Other Ambulatory Visit: Payer: Self-pay | Admitting: Family Medicine

## 2016-08-23 ENCOUNTER — Ambulatory Visit (HOSPITAL_BASED_OUTPATIENT_CLINIC_OR_DEPARTMENT_OTHER)
Admission: RE | Admit: 2016-08-23 | Discharge: 2016-08-23 | Disposition: A | Discharge status: Home / Self Care | Payer: BLUE CROSS/BLUE SHIELD | Source: Ambulatory Visit | Attending: Family Medicine | Admitting: Family Medicine

## 2016-08-23 DIAGNOSIS — E2839 Other primary ovarian failure: Secondary | ICD-10-CM | POA: Diagnosis not present

## 2016-08-23 DIAGNOSIS — Z1231 Encounter for screening mammogram for malignant neoplasm of breast: Secondary | ICD-10-CM

## 2016-08-23 DIAGNOSIS — M85852 Other specified disorders of bone density and structure, left thigh: Secondary | ICD-10-CM | POA: Diagnosis not present

## 2016-08-24 ENCOUNTER — Encounter: Payer: Self-pay | Admitting: Family Medicine

## 2016-08-24 ENCOUNTER — Ambulatory Visit (HOSPITAL_BASED_OUTPATIENT_CLINIC_OR_DEPARTMENT_OTHER)
Admission: RE | Admit: 2016-08-24 | Discharge: 2016-08-24 | Disposition: A | Discharge status: Home / Self Care | Payer: BLUE CROSS/BLUE SHIELD | Source: Ambulatory Visit | Attending: Family Medicine | Admitting: Family Medicine

## 2016-08-24 DIAGNOSIS — Z1231 Encounter for screening mammogram for malignant neoplasm of breast: Secondary | ICD-10-CM | POA: Insufficient documentation

## 2016-09-04 ENCOUNTER — Other Ambulatory Visit: Payer: Self-pay | Admitting: Family Medicine

## 2016-10-14 DIAGNOSIS — R1032 Left lower quadrant pain: Secondary | ICD-10-CM | POA: Diagnosis not present

## 2016-10-14 DIAGNOSIS — K589 Irritable bowel syndrome without diarrhea: Secondary | ICD-10-CM | POA: Diagnosis not present

## 2016-10-14 DIAGNOSIS — K5730 Diverticulosis of large intestine without perforation or abscess without bleeding: Secondary | ICD-10-CM | POA: Diagnosis not present

## 2016-10-14 DIAGNOSIS — R140 Abdominal distension (gaseous): Secondary | ICD-10-CM | POA: Diagnosis not present

## 2016-11-01 ENCOUNTER — Other Ambulatory Visit: Payer: Self-pay | Admitting: Family Medicine

## 2016-11-15 DIAGNOSIS — H40013 Open angle with borderline findings, low risk, bilateral: Secondary | ICD-10-CM | POA: Diagnosis not present

## 2016-11-17 DIAGNOSIS — R945 Abnormal results of liver function studies: Secondary | ICD-10-CM

## 2016-11-17 DIAGNOSIS — R7989 Other specified abnormal findings of blood chemistry: Secondary | ICD-10-CM | POA: Insufficient documentation

## 2016-11-17 DIAGNOSIS — Z79899 Other long term (current) drug therapy: Secondary | ICD-10-CM | POA: Insufficient documentation

## 2016-11-17 DIAGNOSIS — M0609 Rheumatoid arthritis without rheumatoid factor, multiple sites: Secondary | ICD-10-CM | POA: Insufficient documentation
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
## 2016-11-23 ENCOUNTER — Encounter: Payer: Self-pay | Admitting: Rheumatology

## 2016-11-23 ENCOUNTER — Ambulatory Visit (INDEPENDENT_AMBULATORY_CARE_PROVIDER_SITE_OTHER): Payer: BLUE CROSS/BLUE SHIELD | Admitting: Rheumatology

## 2016-11-23 VITALS — BP 126/74 | HR 74 | Resp 14 | Ht 64.5 in | Wt 207.0 lb

## 2016-11-23 DIAGNOSIS — M19042 Primary osteoarthritis, left hand: Secondary | ICD-10-CM

## 2016-11-23 DIAGNOSIS — M19041 Primary osteoarthritis, right hand: Secondary | ICD-10-CM | POA: Diagnosis not present

## 2016-11-23 DIAGNOSIS — M25551 Pain in right hip: Secondary | ICD-10-CM | POA: Diagnosis not present

## 2016-11-23 DIAGNOSIS — Z79899 Other long term (current) drug therapy: Secondary | ICD-10-CM

## 2016-11-23 DIAGNOSIS — E6609 Other obesity due to excess calories: Secondary | ICD-10-CM

## 2016-11-23 DIAGNOSIS — R7989 Other specified abnormal findings of blood chemistry: Secondary | ICD-10-CM | POA: Diagnosis not present

## 2016-11-23 DIAGNOSIS — Z8719 Personal history of other diseases of the digestive system: Secondary | ICD-10-CM

## 2016-11-23 DIAGNOSIS — Z6834 Body mass index (BMI) 34.0-34.9, adult: Secondary | ICD-10-CM | POA: Diagnosis not present

## 2016-11-23 DIAGNOSIS — M0609 Rheumatoid arthritis without rheumatoid factor, multiple sites: Secondary | ICD-10-CM

## 2016-11-23 DIAGNOSIS — R768 Other specified abnormal immunological findings in serum: Secondary | ICD-10-CM | POA: Diagnosis not present

## 2016-11-23 DIAGNOSIS — R945 Abnormal results of liver function studies: Secondary | ICD-10-CM

## 2016-11-23 MED ORDER — HYDROXYCHLOROQUINE SULFATE 200 MG PO TABS: ORAL_TABLET | ORAL | 1 refills | Status: DC

## 2016-11-23 NOTE — Patient Instructions (Signed)
Knee Exercises Ask your health care provider which exercises are safe for you. Do exercises exactly as told by your health care provider and adjust them as directed. It is normal to feel mild stretching, pulling, tightness, or discomfort as you do these exercises, but you should stop right away if you feel sudden pain or your pain gets worse.Do not begin these exercises until told by your health care provider. STRETCHING AND RANGE OF MOTION EXERCISES These exercises warm up your muscles and joints and improve the movement and flexibility of your knee. These exercises also help to relieve pain, numbness, and tingling. Exercise A: Knee Extension, Prone 1. Lie on your abdomen on a bed. 2. Place your left / right knee just beyond the edge of the surface so your knee is not on the bed. You can put a towel under your left / right thigh just above your knee for comfort. 3. Relax your leg muscles and allow gravity to straighten your knee. You should feel a stretch behind your left / right knee. 4. Hold this position for __________ seconds. 5. Scoot up so your knee is supported between repetitions. Repeat __________ times. Complete this stretch __________ times a day. Exercise B: Knee Flexion, Active  1. Lie on your back with both knees straight. If this causes back discomfort, bend your left / right knee so your foot is flat on the floor. 2. Slowly slide your left / right heel back toward your buttocks until you feel a gentle stretch in the front of your knee or thigh. 3. Hold this position for __________ seconds. 4. Slowly slide your left / right heel back to the starting position. Repeat __________ times. Complete this exercise __________ times a day. Exercise C: Quadriceps, Prone  1. Lie on your abdomen on a firm surface, such as a bed or padded floor. 2. Bend your left / right knee and hold your ankle. If you cannot reach your ankle or pant leg, loop a belt around your foot and grab the belt  instead. 3. Gently pull your heel toward your buttocks. Your knee should not slide out to the side. You should feel a stretch in the front of your thigh and knee. 4. Hold this position for __________ seconds. Repeat __________ times. Complete this stretch __________ times a day. Exercise D: Hamstring, Supine 1. Lie on your back. 2. Loop a belt or towel over the ball of your left / right foot. The ball of your foot is on the walking surface, right under your toes. 3. Straighten your left / right knee and slowly pull on the belt to raise your leg until you feel a gentle stretch behind your knee. ? Do not let your left / right knee bend while you do this. ? Keep your other leg flat on the floor. 4. Hold this position for __________ seconds. Repeat __________ times. Complete this stretch __________ times a day. STRENGTHENING EXERCISES These exercises build strength and endurance in your knee. Endurance is the ability to use your muscles for a long time, even after they get tired. Exercise E: Quadriceps, Isometric  1. Lie on your back with your left / right leg extended and your other knee bent. Put a rolled towel or small pillow under your knee if told by your health care provider. 2. Slowly tense the muscles in the front of your left / right thigh. You should see your kneecap slide up toward your hip or see increased dimpling just above the knee. This   motion will push the back of the knee toward the floor. 3. For __________ seconds, keep the muscle as tight as you can without increasing your pain. 4. Relax the muscles slowly and completely. Repeat __________ times. Complete this exercise __________ times a day. Exercise F: Straight Leg Raises - Quadriceps 1. Lie on your back with your left / right leg extended and your other knee bent. 2. Tense the muscles in the front of your left / right thigh. You should see your kneecap slide up or see increased dimpling just above the knee. Your thigh may  even shake a bit. 3. Keep these muscles tight as you raise your leg 4-6 inches (10-15 cm) off the floor. Do not let your knee bend. 4. Hold this position for __________ seconds. 5. Keep these muscles tense as you lower your leg. 6. Relax your muscles slowly and completely after each repetition. Repeat __________ times. Complete this exercise __________ times a day. Exercise G: Hamstring, Isometric 1. Lie on your back on a firm surface. 2. Bend your left / right knee approximately __________ degrees. 3. Dig your left / right heel into the surface as if you are trying to pull it toward your buttocks. Tighten the muscles in the back of your thighs to dig as hard as you can without increasing any pain. 4. Hold this position for __________ seconds. 5. Release the tension gradually and allow your muscles to relax completely for __________ seconds after each repetition. Repeat __________ times. Complete this exercise __________ times a day. Exercise H: Hamstring Curls  If told by your health care provider, do this exercise while wearing ankle weights. Begin with __________ weights. Then increase the weight by 1 lb (0.5 kg) increments. Do not wear ankle weights that are more than __________. 1. Lie on your abdomen with your legs straight. 2. Bend your left / right knee as far as you can without feeling pain. Keep your hips flat against the floor. 3. Hold this position for __________ seconds. 4. Slowly lower your leg to the starting position.  Repeat __________ times. Complete this exercise __________ times a day. Exercise I: Squats (Quadriceps) 1. Stand in front of a table, with your feet and knees pointing straight ahead. You may rest your hands on the table for balance but not for support. 2. Slowly bend your knees and lower your hips like you are going to sit in a chair. ? Keep your weight over your heels, not over your toes. ? Keep your lower legs upright so they are parallel with the table  legs. ? Do not let your hips go lower than your knees. ? Do not bend lower than told by your health care provider. ? If your knee pain increases, do not bend as low. 3. Hold the squat position for __________ seconds. 4. Slowly push with your legs to return to standing. Do not use your hands to pull yourself to standing. Repeat __________ times. Complete this exercise __________ times a day. Exercise J: Wall Slides (Quadriceps)  1. Lean your back against a smooth wall or door while you walk your feet out 18-24 inches (46-61 cm) from it. 2. Place your feet hip-width apart. 3. Slowly slide down the wall or door until your knees bend __________ degrees. Keep your knees over your heels, not over your toes. Keep your knees in line with your hips. 4. Hold for __________ seconds. Repeat __________ times. Complete this exercise __________ times a day. Exercise K: Straight Leg Raises -   Hip Abductors 1. Lie on your side with your left / right leg in the top position. Lie so your head, shoulder, knee, and hip line up. You may bend your bottom knee to help you keep your balance. 2. Roll your hips slightly forward so your hips are stacked directly over each other and your left / right knee is facing forward. 3. Leading with your heel, lift your top leg 4-6 inches (10-15 cm). You should feel the muscles in your outer hip lifting. ? Do not let your foot drift forward. ? Do not let your knee roll toward the ceiling. 4. Hold this position for __________ seconds. 5. Slowly return your leg to the starting position. 6. Let your muscles relax completely after each repetition. Repeat __________ times. Complete this exercise __________ times a day. Exercise L: Straight Leg Raises - Hip Extensors 1. Lie on your abdomen on a firm surface. You can put a pillow under your hips if that is more comfortable. 2. Tense the muscles in your buttocks and lift your left / right leg about 4-6 inches (10-15 cm). Keep your knee  straight as you lift your leg. 3. Hold this position for __________ seconds. 4. Slowly lower your leg to the starting position. 5. Let your leg relax completely after each repetition. Repeat __________ times. Complete this exercise __________ times a day. This information is not intended to replace advice given to you by your health care provider. Make sure you discuss any questions you have with your health care provider. Document Released: 01/19/2005 Document Revised: 11/30/2015 Document Reviewed: 01/11/2015 [employer] Interactive Patient Education  2018 [employer] Hip Exercises Ask your health care provider which exercises are safe for you. Do exercises exactly as told by your health care provider and adjust them as directed. It is normal to feel mild stretching, pulling, tightness, or discomfort as you do these exercises, but you should stop right away if you feel sudden pain or your pain gets worse.Do not begin these exercises until told by your health care provider. STRETCHING AND RANGE OF MOTION EXERCISES These exercises warm up your muscles and joints and improve the movement and flexibility of your hip. These exercises also help to relieve pain, numbness, and tingling. Exercise A: Hamstrings, Supine  1. Lie on your back. 2. Loop a belt or towel over the ball of your left / rightfoot. The ball of your foot is on the walking surface, right under your toes. 3. Straighten your left / rightknee and slowly pull on the belt to raise your leg. ? Do not let your left / right knee bend while you do this. ? Keep your other leg flat on the floor. ? Raise the left / right leg until you feel a gentle stretch behind your left / right knee or thigh. 4. Hold this position for __________ seconds. 5. Slowly return your leg to the starting position. Repeat __________ times. Complete this stretch __________ times a day. Exercise B: Hip Rotators  1. Lie on your back on a firm surface. 2. Hold your  left / right knee with your left / right hand. Hold your ankle with your other hand. 3. Gently pull your left / right knee and rotate your lower leg toward your other shoulder. ? Pull until you feel a stretch in your buttocks. ? Keep your hips and shoulders firmly planted while you do this stretch. 4. Hold this position for __________ seconds. Repeat __________ times. Complete this stretch __________ times a day. Exercise   C: V-Sit (Hamstrings and Adductors)  1. Sit on the floor with your legs extended in a large "V" shape. Keep your knees straight during this exercise. 2. Start with your head and chest upright, then bend at your waist to reach for your left foot (position A). You should feel a stretch in your right inner thigh. 3. Hold this position for __________ seconds. Then slowly return to the upright position. 4. Bend at your waist to reach forward (position B). You should feel a stretch behind both of your thighs and knees. 5. Hold this position for __________ seconds. Then slowly return to the upright position. 6. Bend at your waist to reach for your right foot (position C). You should feel a stretch in your left inner thigh. 7. Hold this position for __________ seconds. Then slowly return to the upright position. Repeat __________ times. Complete this stretch __________ times a day. Exercise D: Lunge (Hip Flexors)  1. Place your left / right knee on the floor and bend your other knee so that is directly over your ankle. You should be half-kneeling. 2. Keep good posture with your head over your shoulders. 3. Tighten your buttocks to point your tailbone downward. This helps your back to keep from arching too much. 4. You should feel a gentle stretch in the front of your left / right thigh and hip. If you do not feel any resistance, slightly slide your other foot forward and then slowly lunge forward so your knee once again lines up over your ankle. 5. Make sure your tailbone continues to  point downward. 6. Hold this position for __________ seconds. Repeat __________ times. Complete this stretch __________ times a day. STRENGTHENING EXERCISES These exercises build strength and endurance in your hip. Endurance is the ability to use your muscles for a long time, even after they get tired. Exercise E: Bridge (Hip Extensors)  1. Lie on your back on a firm surface with your knees bent and your feet flat on the floor. 2. Tighten your buttocks muscles and lift your bottom off the floor until the trunk of your body is level with your thighs. ? Do not arch your back. ? You should feel the muscles working in your buttocks and the back of your thighs. If you do not feel these muscles, slide your feet 1-2 inches (2.5-5 cm) farther away from your buttocks. 3. Hold this position for __________ seconds. 4. Slowly lower your hips to the starting position. 5. Let your muscles relax completely between repetitions. 6. If this exercise is too easy, try doing it with your arms crossed over your chest. Repeat __________ times. Complete this exercise __________ times a day. Exercise F: Straight Leg Raises - Hip Abductors  1. Lie on your side with your left / right leg in the top position. Lie so your head, shoulder, knee, and hip line up with each other. You may bend your bottom knee to help you balance. 2. Roll your hips slightly forward, so your hips are stacked directly over each other and your left / right knee is facing forward. 3. Leading with your heel, lift your top leg 4-6 inches (10-15 cm). You should feel the muscles in your outer hip lifting. ? Do not let your foot drift forward. ? Do not let your knee roll toward the ceiling. 4. Hold this position for __________ seconds. 5. Slowly return to the starting position. 6. Let your muscles relax completely between repetitions. Repeat __________ times. Complete this exercise __________ times   a day. Exercise G: Straight Leg Raises - Hip  Adductors  1. Lie on your side with your left / right leg in the bottom position. Lie so your head, shoulder, knee, and hip line up. You may place your upper foot in front to help you balance. 2. Roll your hips slightly forward, so your hips are stacked directly over each other and your left / right knee is facing forward. 3. Tense the muscles in your inner thigh and lift your bottom leg 4-6 inches (10-15 cm). 4. Hold this position for __________ seconds. 5. Slowly return to the starting position. 6. Let your muscles relax completely between repetitions. Repeat __________ times. Complete this exercise __________ times a day. Exercise H: Straight Leg Raises - Quadriceps  1. Lie on your back with your left / right leg extended and your other knee bent. 2. Tense the muscles in the front of your left / right thigh. When you do this, you should see your kneecap slide up or see increased dimpling just above your knee. 3. Tighten these muscles even more and raise your leg 4-6 inches (10-15 cm) off the floor. 4. Hold this position for __________ seconds. 5. Keep these muscles tense as you lower your leg. 6. Relax the muscles slowly and completely between repetitions. Repeat __________ times. Complete this exercise __________ times a day. Exercise I: Hip Abductors, Standing 1. Tie one end of a rubber exercise band or tubing to a secure surface, such as a table or pole. 2. Loop the other end of the band or tubing around your left / right ankle. 3. Keeping your ankle with the band or tubing directly opposite of the secured end, step away until there is tension in the tubing or band. Hold onto a chair as needed for balance. 4. Lift your left / right leg out to your side. While you do this: ? Keep your back upright. ? Keep your shoulders over your hips. ? Keep your toes pointing forward. ? Make sure to use your hip muscles to lift your leg. Do not "throw" your leg or tip your body to lift your  leg. 5. Hold this position for __________ seconds. 6. Slowly return to the starting position. Repeat __________ times. Complete this exercise __________ times a day. Exercise J: Squats (Quadriceps) 1. Stand in a door frame so your feet and knees are in line with the frame. You may place your hands on the frame for balance. 2. Slowly bend your knees and lower your hips like you are going to sit in a chair. ? Keep your lower legs in a straight-up-and-down position. ? Do not let your hips go lower than your knees. ? Do not bend your knees lower than told by your health care provider. ? If your hip pain increases, do not bend as low. 3. Hold this position for ___________ seconds. 4. Slowly push with your legs to return to standing. Do not use your hands to pull yourself to standing. Repeat __________ times. Complete this exercise __________ times a day. This information is not intended to replace advice given to you by your health care provider. Make sure you discuss any questions you have with your health care provider. Document Released: 03/25/2005 Document Revised: 11/30/2015 Document Reviewed: 03/02/2015 Else[employer] Interactive Patient Education  2018 [employer]  

## 2017-01-02 ENCOUNTER — Ambulatory Visit: Payer: BLUE CROSS/BLUE SHIELD | Admitting: Family Medicine

## 2017-02-23 ENCOUNTER — Encounter: Payer: Self-pay | Admitting: Family Medicine

## 2017-02-23 MED ORDER — CLONAZEPAM 0.5 MG PO TABS: ORAL_TABLET | ORAL | 1 refills | Status: DC

## 2017-02-23 NOTE — Telephone Encounter (Signed)
Last OV 07/04/16 CPE Clonazepam last filled 02/18/16 #60 with 1

## 2017-04-27 ENCOUNTER — Ambulatory Visit: Payer: BLUE CROSS/BLUE SHIELD | Admitting: Rheumatology

## 2017-05-03 ENCOUNTER — Other Ambulatory Visit: Payer: Self-pay | Admitting: General Practice

## 2017-05-03 MED ORDER — ROSUVASTATIN CALCIUM 10 MG PO TABS: 10.0000 mg | ORAL_TABLET | Freq: Every day | ORAL | 0 refills | Status: DC

## 2017-05-27 ENCOUNTER — Other Ambulatory Visit: Payer: Self-pay | Admitting: Rheumatology

## 2017-07-24 ENCOUNTER — Telehealth: Payer: Self-pay | Admitting: Family Medicine

## 2017-07-24 NOTE — Telephone Encounter (Signed)
Copied from [place] [id number]. Topic: Quick Communication - Rx Refill/Question >> Jul 24, 2017 11:38 AM Ahmed Prima L wrote: Medication: fenofibrate 160 MG tablet Has the patient contacted their pharmacy? Yes no refills (Agent: If no, request that the patient contact the pharmacy for the refill.) Preferred Pharmacy (with phone number or street name): [id number] - [address], [address] AT [address] Agent: Please be advised that RX refills may take up to 3 business days. We ask that you follow-up with your pharmacy.

## 2017-07-25 MED ORDER — FENOFIBRATE 160 MG PO TABS: ORAL_TABLET | ORAL | 0 refills | Status: DC

## 2017-07-28 ENCOUNTER — Ambulatory Visit (INDEPENDENT_AMBULATORY_CARE_PROVIDER_SITE_OTHER): Payer: BLUE CROSS/BLUE SHIELD | Admitting: Family Medicine

## 2017-07-28 ENCOUNTER — Encounter: Payer: Self-pay | Admitting: Family Medicine

## 2017-07-28 ENCOUNTER — Other Ambulatory Visit: Payer: Self-pay

## 2017-07-28 VITALS — BP 114/68 | HR 67 | Temp 97.6°F | Resp 15 | Ht 63.0 in | Wt 209.6 lb

## 2017-07-28 DIAGNOSIS — Z0000 Encounter for general adult medical examination without abnormal findings: Secondary | ICD-10-CM

## 2017-07-28 DIAGNOSIS — M0609 Rheumatoid arthritis without rheumatoid factor, multiple sites: Secondary | ICD-10-CM | POA: Diagnosis not present

## 2017-07-28 DIAGNOSIS — G4730 Sleep apnea, unspecified: Secondary | ICD-10-CM | POA: Diagnosis not present

## 2017-07-28 DIAGNOSIS — E785 Hyperlipidemia, unspecified: Secondary | ICD-10-CM

## 2017-07-28 DIAGNOSIS — Z9189 Other specified personal risk factors, not elsewhere classified: Secondary | ICD-10-CM

## 2017-07-28 DIAGNOSIS — Z1159 Encounter for screening for other viral diseases: Secondary | ICD-10-CM | POA: Diagnosis not present

## 2017-07-28 LAB — CBC WITH DIFFERENTIAL/PLATELET
BASOS PCT: 0.6 % (ref 0.0–3.0)
Basophils Absolute: 0 10*3/uL (ref 0.0–0.1)
EOS ABS: 0.5 10*3/uL (ref 0.0–0.7)
Eosinophils Relative: 6.8 % — ABNORMAL HIGH (ref 0.0–5.0)
HCT: 42.1 % (ref 36.0–46.0)
HEMOGLOBIN: 14.5 g/dL (ref 12.0–15.0)
Lymphocytes Relative: 36.3 % (ref 12.0–46.0)
Lymphs Abs: 2.7 10*3/uL (ref 0.7–4.0)
MCHC: 34.4 g/dL (ref 30.0–36.0)
MCV: 87.8 fl (ref 78.0–100.0)
Monocytes Absolute: 0.6 10*3/uL (ref 0.1–1.0)
Monocytes Relative: 8.5 % (ref 3.0–12.0)
NEUTROS PCT: 47.8 % (ref 43.0–77.0)
Neutro Abs: 3.5 10*3/uL (ref 1.4–7.7)
PLATELETS: 292 10*3/uL (ref 150.0–400.0)
RBC: 4.79 Mil/uL (ref 3.87–5.11)
RDW: 13.3 % (ref 11.5–15.5)
WBC: 7.3 10*3/uL (ref 4.0–10.5)

## 2017-07-28 LAB — LIPID PANEL
CHOLESTEROL: 141 mg/dL (ref 0–200)
HDL: 50.5 mg/dL (ref >39.00)
LDL Cholesterol: 63 mg/dL (ref 0–99)
NONHDL: 90.09
Total CHOL/HDL Ratio: 3
Triglycerides: 133 mg/dL (ref 0.0–149.0)
VLDL: 26.6 mg/dL (ref 0.0–40.0)

## 2017-07-28 LAB — TSH: TSH: 1.02 u[IU]/mL (ref 0.35–4.50)

## 2017-07-28 LAB — HEPATIC FUNCTION PANEL
ALK PHOS: 116 U/L (ref 39–117)
ALT: 64 U/L — AB (ref 0–35)
AST: 53 U/L — ABNORMAL HIGH (ref 0–37)
Albumin: 4.1 g/dL (ref 3.5–5.2)
BILIRUBIN DIRECT: 0.2 mg/dL (ref 0.0–0.3)
BILIRUBIN TOTAL: 0.8 mg/dL (ref 0.2–1.2)
TOTAL PROTEIN: 7.2 g/dL (ref 6.0–8.3)

## 2017-07-28 LAB — BASIC METABOLIC PANEL
BUN: 21 mg/dL (ref 6–23)
CALCIUM: 9.3 mg/dL (ref 8.4–10.5)
CHLORIDE: 107 meq/L (ref 96–112)
CO2: 24 meq/L (ref 19–32)
Creatinine, Ser: 0.68 mg/dL (ref 0.40–1.20)
GFR: 93.04 mL/min (ref >60.00)
GLUCOSE: 90 mg/dL (ref 70–99)
POTASSIUM: 4.3 meq/L (ref 3.5–5.1)
SODIUM: 140 meq/L (ref 135–145)

## 2017-07-28 NOTE — Assessment & Plan Note (Signed)
Following w/ Dr Estanislado Pandy

## 2017-07-28 NOTE — Assessment & Plan Note (Signed)
Pt's PE WNL w/ exception of obesity.  UTD on colonoscopy, pap.  mammo due next month- pt to schedule.  Check labs.  Anticipatory guidance provided.

## 2017-07-28 NOTE — Progress Notes (Signed)
   Subjective:    Patient ID: Christie Thomas, female    DOB: 11/22/1955, 62 y.o.   MRN: [id number]  HPI CPE- UTD on colonoscopy, DEXA.  Due for mammo next month- pt to schedule. Husband has recommended sleep study due to pt periodically waking from sleep sounding as if she's 'gasping'. + snoring   Review of Systems Patient reports no vision/ hearing changes, adenopathy,fever, weight change,  persistant/recurrent hoarseness , swallowing issues, chest pain, palpitations, edema, persistant/recurrent cough, hemoptysis, dyspnea (rest/exertional/paroxysmal nocturnal), gastrointestinal bleeding (melena, rectal bleeding), abdominal pain, significant heartburn, bowel changes, GU symptoms (dysuria, hematuria, incontinence),  syncope, focal weakness, memory loss, numbness & tingling, skin/hair/nail changes, abnormal bruising or bleeding, anxiety, or depression.   + vaginal dryness    Objective:   Physical Exam General Appearance:    Alert, cooperative, no distress, appears stated age  Head:    Normocephalic, without obvious abnormality, atraumatic  Eyes:    PERRL, conjunctiva/corneas clear, EOM's intact, fundi    benign, both eyes  Ears:    Normal external ear canals, TMs scarred bilaterally  Nose:   Nares normal, septum midline, mucosa normal, no drainage    or sinus tenderness  Throat:   Lips, mucosa, and tongue normal; teeth and gums normal  Neck:   Supple, symmetrical, trachea midline, no adenopathy;    Thyroid: no enlargement/tenderness/nodules  Back:     Symmetric, no curvature, ROM normal, no CVA tenderness  Lungs:     Clear to auscultation bilaterally, respirations unlabored  Chest Wall:    No tenderness or deformity   Heart:    Regular rate and rhythm, S1 and S2 normal, no murmur, rub   or gallop  Breast Exam:    Deferred to mammo  Abdomen:     Soft, non-tender, bowel sounds active all four quadrants,    no masses, no organomegaly  Genitalia:    Deferred to GYN  Rectal:      Extremities:   Extremities normal, atraumatic, no cyanosis or edema  Pulses:   2+ and symmetric all extremities  Skin:   Skin color, texture, turgor normal, no rashes or lesions  Lymph nodes:   Cervical, supraclavicular, and axillary nodes normal  Neurologic:   CNII-XII intact, normal strength, sensation and reflexes    throughout          Assessment & Plan:

## 2017-07-28 NOTE — Assessment & Plan Note (Signed)
Chronic problem.  Tolerating statin w/o difficulty.  Check labs.  Adjust meds prn  

## 2017-07-28 NOTE — Patient Instructions (Signed)
Follow up in 6 months to recheck cholesterol We'll notify you of your lab results and make any changes if needed Continue to work on healthy diet and regular exercise- you can do it! Call and schedule your mammogram for June We'll call you with your Pulmonary appt for sleep apnea evaluation Call with any questions or concerns Happy Mother's Day!!!

## 2017-07-30 LAB — HEPATITIS C ANTIBODY
Hepatitis C Ab: NONREACTIVE
SIGNAL TO CUT-OFF: 0.01 (ref <1.00)

## 2017-07-31 ENCOUNTER — Encounter: Payer: Self-pay | Admitting: General Practice

## 2017-08-11 ENCOUNTER — Encounter: Payer: Self-pay | Admitting: Pulmonary Disease

## 2017-08-11 ENCOUNTER — Ambulatory Visit (INDEPENDENT_AMBULATORY_CARE_PROVIDER_SITE_OTHER): Payer: BLUE CROSS/BLUE SHIELD | Admitting: Pulmonary Disease

## 2017-08-11 VITALS — BP 132/88 | HR 74 | Ht 63.0 in | Wt 206.6 lb

## 2017-08-11 DIAGNOSIS — R0683 Snoring: Secondary | ICD-10-CM | POA: Diagnosis not present

## 2017-08-11 NOTE — Progress Notes (Signed)
   Subjective:    Patient ID: Christie Thomas, female    DOB: [birth year], 62 y.o.   MRN: [id number]  HPI    Review of Systems  Constitutional: Positive for unexpected weight change. Negative for fever.  HENT: Negative for congestion, dental problem, ear pain, nosebleeds, postnasal drip, rhinorrhea, sinus pressure, sneezing, sore throat and trouble swallowing.   Eyes: Negative for redness and itching.  Respiratory: Positive for shortness of breath. Negative for cough, chest tightness and wheezing.   Cardiovascular: Negative for palpitations and leg swelling.  Gastrointestinal: Negative for nausea and vomiting.  Genitourinary: Negative for dysuria.  Musculoskeletal: Negative for joint swelling.  Skin: Negative for rash.  Neurological: Negative for headaches.  Hematological: Does not bruise/bleed easily.  Psychiatric/Behavioral: Negative for dysphoric mood. The patient is not nervous/anxious.        Objective:   Physical Exam        Assessment & Plan:

## 2017-08-11 NOTE — Patient Instructions (Signed)
Will arrange for home sleep study Will call to arrange for follow up after sleep study reviewed  

## 2017-08-11 NOTE — Progress Notes (Signed)
[facility]  Chief Complaint  Patient presents with  . sleep Consult    snoring, waking up during the night, episodes of rapid heartbeat , falls asleep easily,    Vital signs: BP 132/88 (BP Location: Left Arm, Cuff Size: Normal)   Pulse 74   Ht 5\' 3"  (1.6 m)   Wt 206 lb 9.6 oz (93.7 kg)   SpO2 98%   BMI 36.60 kg/m   History of Present Illness: Christie Thomas is a 62 y.o. female for evaluation of sleep problems.  She has noticed trouble with her sleep for a while.  This has been getting worse.  She snores, and wake up hearing herself snore.  When she wakes up it feels like her heart is racing at times.  She also feels her throat closing, and this wakes her up.  She can fall asleep easily when watching something boring on TV.  She goes to sleep at 10 pm.  She falls asleep within 30 minutes.  She wakes up 2 to 3 times to use the bathroom.  She gets out of bed at 6 am.  She feels tired in the morning.  She denies morning headache.  She does not use anything to help her fall sleep or stay awake.  She denies sleep walking, sleep talking, bruxism, or nightmares.  There is no history of restless legs.  She denies sleep hallucinations, sleep paralysis, or cataplexy.  The Epworth score is 28 out of 24.   Physical Exam:  General - pleasant Eyes - pupils reactive ENT - no sinus tenderness, no oral exudate, no LAN Cardiac - regular, no murmur Chest - no wheeze, rales Abd - soft, non tender Ext - no edema Skin - no rashes Neuro - normal strength Psych - normal mood  Discussion: She has snoring, sleep disruption, apnea, and daytime sleepiness.  I am concerned she could have sleep apnea.  We discussed how sleep apnea can affect various health problems, including risks for hypertension, cardiovascular disease, and diabetes.  We also discussed how sleep disruption can increase risks for accidents, such as while driving.  Weight loss as a means  of improving sleep apnea was also reviewed.  Additional treatment options discussed were CPAP therapy, oral appliance, and surgical intervention.  Assessment/Plan:  Snoring, daytime sleepiness. - will arrange for home sleep study to further assess   Patient Instructions  Will arrange for home sleep study Will call to arrange for follow up after sleep study reviewed    Chesley Mires, MD [place] 08/11/2017, 4:10 PM  Flow Sheet  Sleep tests:  Review of Systems: Constitutional: Positive for unexpected weight change. Negative for fever.  HENT: Negative for congestion, dental problem, ear pain, nosebleeds, postnasal drip, rhinorrhea, sinus pressure, sneezing, sore throat and trouble swallowing.   Eyes: Negative for redness and itching.  Respiratory: Positive for shortness of breath. Negative for cough, chest tightness and wheezing.   Cardiovascular: Negative for palpitations and leg swelling.  Gastrointestinal: Negative for nausea and vomiting.  Genitourinary: Negative for dysuria.  Musculoskeletal: Negative for joint swelling.  Skin: Negative for rash.  Neurological: Negative for headaches.  Hematological: Does not bruise/bleed easily.  Psychiatric/Behavioral: Negative for dysphoric mood. The patient is not nervous/anxious.    Past Medical History: She  has a past medical history of Anxiety (01/20/2016), Asthma, Diverticulitis (01/20/2016), Diverticulosis (01/20/2016), Epilepsy ([place]), Epilepsy ([place]) (01/20/2016), Fatty liver (01/20/2016), GERD (gastroesophageal reflux disease), Hay fever, Hyperlipidemia, IBS (irritable bowel syndrome) (01/20/2016),  Obesity (01/20/2016), Osteoarthritis of both hands (01/20/2016), Swelling of ankle joint, left (01/20/2016), and Trochanteric bursitis (01/20/2016).  Past Surgical History: She  has a past surgical history that includes Cholecystectomy; Breast biopsy; Tonsillectomy and adenoidectomy; Back surgery; Shoulder surgery; Cesarean  section; Breast enhancement surgery; Cardiac catheterization (07/2010); Hand surgery; and Carpal tunnel release.  Family History: Her family history includes Alzheimer's disease in her father; Cancer in her mother; Diabetes in her mother; Hyperlipidemia in her mother; Hypertension in her mother; Stroke in her mother; Sudden death (age of onset: 70) in her paternal grandfather.  Social History: She  reports that she has never smoked. She has never used smokeless tobacco. She reports that she does not drink alcohol or use drugs.  Medications: Allergies as of 08/11/2017      Reactions   Penicillins    Throat swelling   Shellfish Allergy Anaphylaxis   Metoclopramide Other (See Comments)      Medication List        Accurate as of 08/11/17  4:10 PM. Always use your most recent med list.          albuterol 108 (90 Base) MCG/ACT inhaler Commonly known as:  PROVENTIL HFA;VENTOLIN HFA Inhale 2 puffs into the lungs every 4 (four) hours as needed.   AMBULATORY NON FORMULARY MEDICATION Ultra flora IB -- once daily   clonazePAM 0.5 MG tablet Commonly known as:  KLONOPIN take 1 tablet by mouth three times a day if needed for anxiety   fenofibrate 160 MG tablet take 1 tablet by mouth once daily with food   fluticasone 50 MCG/ACT nasal spray Commonly known as:  FLONASE instill 1 to 2 sprays into each nostril daily   QVAR 40 MCG/ACT inhaler Generic drug:  beclomethasone inhale 2 puffs INTO THE LUNGS BY MOUTH twice a day   rosuvastatin 10 MG tablet Commonly known as:  CRESTOR Take 1 tablet (10 mg total) by mouth daily. Please call [phone] for a cholesterol follow up   TURMERIC CURCUMIN PO Take 500 mg by mouth 2 (two) times daily.   VITAMIN D PO Take by mouth.

## 2017-08-24 ENCOUNTER — Other Ambulatory Visit: Payer: Self-pay | Admitting: Family Medicine

## 2017-09-06 DIAGNOSIS — G4733 Obstructive sleep apnea (adult) (pediatric): Secondary | ICD-10-CM | POA: Diagnosis not present

## 2017-09-07 ENCOUNTER — Encounter: Payer: Self-pay | Admitting: Pulmonary Disease

## 2017-09-07 ENCOUNTER — Telehealth: Payer: Self-pay | Admitting: Pulmonary Disease

## 2017-09-07 DIAGNOSIS — G4733 Obstructive sleep apnea (adult) (pediatric): Secondary | ICD-10-CM | POA: Diagnosis not present

## 2017-09-07 HISTORY — DX: Obstructive sleep apnea (adult) (pediatric): G47.33

## 2017-09-07 NOTE — Telephone Encounter (Signed)
HST 09/06/17 >> AHI 14.3, SaO2 low 78%.   Will have my nurse inform pt that sleep study shows mild to moderate sleep apnea.  Options are 1) CPAP now, 2) ROV first.  If She is agreeable to CPAP, then please send order for auto CPAP range 5 to 15 cm H2O with heated humidity and mask of choice.  Have download sent 1 month after starting CPAP and set up ROV 2 months after starting CPAP.  ROV can be with me or NP.

## 2017-09-08 ENCOUNTER — Other Ambulatory Visit: Payer: Self-pay | Admitting: *Deleted

## 2017-09-08 DIAGNOSIS — R0683 Snoring: Secondary | ICD-10-CM

## 2017-09-08 NOTE — Telephone Encounter (Signed)
Patient returned called, CB is [phone]

## 2017-09-08 NOTE — Telephone Encounter (Signed)
[facility] 6/21 will call back.

## 2017-09-08 NOTE — Telephone Encounter (Signed)
Called and spoke with pt letting her know the results of HST and gave her the two options stated per VS.  Pt stated she would go ahead and start CPAP therapy.  Order has been placed for pt to begin CPAP and stated in the order to send download 1 month after start.  Scheduled pt 2 month ROV with VS.  Nothing further needed.

## 2017-09-20 ENCOUNTER — Other Ambulatory Visit: Payer: Self-pay | Admitting: Family Medicine

## 2017-09-20 DIAGNOSIS — G4733 Obstructive sleep apnea (adult) (pediatric): Secondary | ICD-10-CM | POA: Diagnosis not present

## 2017-10-16 ENCOUNTER — Other Ambulatory Visit: Payer: Self-pay | Admitting: Family Medicine

## 2017-10-16 DIAGNOSIS — Z1231 Encounter for screening mammogram for malignant neoplasm of breast: Secondary | ICD-10-CM

## 2017-10-18 ENCOUNTER — Ambulatory Visit (HOSPITAL_BASED_OUTPATIENT_CLINIC_OR_DEPARTMENT_OTHER)
Admission: RE | Admit: 2017-10-18 | Discharge: 2017-10-18 | Disposition: A | Discharge status: Home / Self Care | Payer: BLUE CROSS/BLUE SHIELD | Source: Ambulatory Visit | Attending: Family Medicine | Admitting: Family Medicine

## 2017-10-18 DIAGNOSIS — Z1231 Encounter for screening mammogram for malignant neoplasm of breast: Secondary | ICD-10-CM | POA: Insufficient documentation

## 2017-10-21 DIAGNOSIS — G4733 Obstructive sleep apnea (adult) (pediatric): Secondary | ICD-10-CM | POA: Diagnosis not present

## 2017-10-30 DIAGNOSIS — N951 Menopausal and female climacteric states: Secondary | ICD-10-CM | POA: Diagnosis not present

## 2017-11-01 DIAGNOSIS — R6882 Decreased libido: Secondary | ICD-10-CM | POA: Diagnosis not present

## 2017-11-01 DIAGNOSIS — N951 Menopausal and female climacteric states: Secondary | ICD-10-CM | POA: Diagnosis not present

## 2017-11-01 DIAGNOSIS — N898 Other specified noninflammatory disorders of vagina: Secondary | ICD-10-CM | POA: Diagnosis not present

## 2017-11-01 DIAGNOSIS — G479 Sleep disorder, unspecified: Secondary | ICD-10-CM | POA: Diagnosis not present

## 2017-11-13 ENCOUNTER — Encounter: Payer: Self-pay | Admitting: Pulmonary Disease

## 2017-11-13 ENCOUNTER — Ambulatory Visit: Payer: BLUE CROSS/BLUE SHIELD | Admitting: Pulmonary Disease

## 2017-11-13 VITALS — BP 126/78 | HR 64 | Ht 63.5 in | Wt 208.0 lb

## 2017-11-13 DIAGNOSIS — Z7189 Other specified counseling: Secondary | ICD-10-CM

## 2017-11-13 DIAGNOSIS — J301 Allergic rhinitis due to pollen: Secondary | ICD-10-CM | POA: Diagnosis not present

## 2017-11-13 DIAGNOSIS — J310 Chronic rhinitis: Secondary | ICD-10-CM

## 2017-11-13 DIAGNOSIS — G4733 Obstructive sleep apnea (adult) (pediatric): Secondary | ICD-10-CM | POA: Diagnosis not present

## 2017-11-13 NOTE — Progress Notes (Signed)
[facility]  Chief Complaint  Patient presents with  . Follow-up    wearing cpap avg 5-6hr nightly- feels pressure & mask are okay. [id number]    Constitutional: BP 126/78 (BP Location: Left Arm, Cuff Size: Normal)   Pulse 64   Ht 5' 3.5" (1.613 m)   Wt 208 lb (94.3 kg)   SpO2 97%   BMI 36.27 kg/m   History of Present Illness: Christie Thomas is a 62 y.o. female with obstructive sleep apnea.  She had home sleep study in June.  Found to have mild to moderate OSA with hypoxia.    Started on auto CPAP.  Tried one mask, and then changed to full face mask.  This is more comfortable.  Had trouble adjusting at the beginning.  Felt like she was getting too much pressure in her chest and felt over-inflated.  This has improved.  She now feels like she can take deeper breaths.  She typically has allergies in the Fall.  She uses OTC antihistamine and flonase.  She was questioning about what to do if she gets a respiratory infection, and how this could impact her CPAP use.  She is not currently having sinus congestion, sore throat, dry mouth, cough, wheeze, or chest congestion.  She feels her sleep is better and she has more energy during the day.  She is planning to start an exercise regimen in the next couple of weeks.  Comprehensive Respiratory Exam:  Appearance - well kempt  ENMT - nasal mucosa moist, turbinates clear, midline nasal septum, no dental lesions, no gingival bleeding, no oral exudates, no tonsillar hypertrophy, decreased AP diameter, low laying soft palate Neck - no masses, trachea midline, no thyromegaly, no elevation in JVP Respiratory - normal appearance of chest wall, normal respiratory effort w/o accessory muscle use, no dullness on percussion, no wheezing or rales CV - s1s2 regular rate and rhythm, no murmurs, no peripheral edema, radial pulses symmetric GI - soft, non tender Lymph - no adenopathy noted in neck and axillary  areas MSK - normal muscle strength and tone, normal gait Ext - no cyanosis, clubbing, or joint inflammation noted Skin - no rashes, lesions, or ulcers Neuro - oriented to person, place, and time Psych - normal mood and affect   Assessment/Plan:  Obstructive sleep apnea. - reviewed her sleep study results with her - she is compliant with CPAP and reports benefit from CPAP - continue auto CPAP  CPAP rhinitis with history of allergic rhinitis. - discussed options to assist with mask fit - prn OTC antihistamine and flonase  Persistent asthma. - Qvar, albuterol per PCP   Patient Instructions  Follow up in 1 year   Chesley Mires, MD [place] 11/13/2017, 9:25 AM  Flow Sheet  Sleep tests: HST 09/06/17 >> AHI 14.3, SaO2 low 78% Auto CPAP 10/11/17 to 11/09/17 >> used on 29 of 30 nights with average 6 hrs 21 min.  Average AHI 1.3 with median CPAP 9 and 95 th percentile CPAP 12 cm H2O  Past Medical History: She  has a past medical history of Anxiety (01/20/2016), Asthma, Diverticulitis (01/20/2016), Diverticulosis (01/20/2016), Epilepsy ([place]), Epilepsy ([place]) (01/20/2016), Fatty liver (01/20/2016), GERD (gastroesophageal reflux disease), Hay fever, Hyperlipidemia, IBS (irritable bowel syndrome) (01/20/2016), Obesity (01/20/2016), OSA (obstructive sleep apnea) (09/07/2017), Osteoarthritis of both hands (01/20/2016), Swelling of ankle joint, left (01/20/2016), and Trochanteric bursitis (01/20/2016).  Past Surgical History: She  has a past surgical history that includes Cholecystectomy; Breast biopsy; Tonsillectomy  and adenoidectomy; Back surgery; Shoulder surgery; Cesarean section; Breast enhancement surgery; Cardiac catheterization (07/2010); Hand surgery; and Carpal tunnel release.  Family History: Her family history includes Alzheimer's disease in her father; Cancer in her mother; Diabetes in her mother; Hyperlipidemia in her mother; Hypertension in her mother; Stroke in her  mother; Sudden death (age of onset: 24) in her paternal grandfather.  Social History: She  reports that she has never smoked. She has never used smokeless tobacco. She reports that she does not drink alcohol or use drugs.  Medications: Allergies as of 11/13/2017      Reactions   Penicillins    Throat swelling   Shellfish Allergy Anaphylaxis   Metoclopramide Other (See Comments)      Medication List        Accurate as of 11/13/17  9:25 AM. Always use your most recent med list.          albuterol 108 (90 Base) MCG/ACT inhaler Commonly known as:  PROVENTIL HFA;VENTOLIN HFA Inhale 2 puffs into the lungs every 4 (four) hours as needed.   AMBULATORY NON FORMULARY MEDICATION Ultra flora IB -- once daily   clonazePAM 0.5 MG tablet Commonly known as:  KLONOPIN take 1 tablet by mouth three times a day if needed for anxiety   fenofibrate 160 MG tablet TAKE 1 TABLET BY MOUTH EVERY DAY WITH FOOD   fluticasone 50 MCG/ACT nasal spray Commonly known as:  FLONASE instill 1 to 2 sprays into each nostril daily   QVAR 40 MCG/ACT inhaler Generic drug:  beclomethasone inhale 2 puffs INTO THE LUNGS BY MOUTH twice a day   rosuvastatin 10 MG tablet Commonly known as:  CRESTOR Take 1 tablet (10 mg total) by mouth daily. Please call [phone] for a cholesterol follow up   TURMERIC CURCUMIN PO Take 500 mg by mouth 2 (two) times daily.   VITAMIN D PO Take by mouth.

## 2017-11-13 NOTE — Patient Instructions (Signed)
Follow up in 1 year.

## 2017-11-21 DIAGNOSIS — G4733 Obstructive sleep apnea (adult) (pediatric): Secondary | ICD-10-CM | POA: Diagnosis not present

## 2017-11-29 DIAGNOSIS — R635 Abnormal weight gain: Secondary | ICD-10-CM | POA: Diagnosis not present

## 2017-11-29 DIAGNOSIS — N951 Menopausal and female climacteric states: Secondary | ICD-10-CM | POA: Diagnosis not present

## 2017-11-29 DIAGNOSIS — N898 Other specified noninflammatory disorders of vagina: Secondary | ICD-10-CM | POA: Diagnosis not present

## 2017-11-29 DIAGNOSIS — G479 Sleep disorder, unspecified: Secondary | ICD-10-CM | POA: Diagnosis not present

## 2017-11-29 DIAGNOSIS — R6882 Decreased libido: Secondary | ICD-10-CM | POA: Diagnosis not present

## 2017-12-04 DIAGNOSIS — E669 Obesity, unspecified: Secondary | ICD-10-CM | POA: Diagnosis not present

## 2017-12-04 DIAGNOSIS — Z1331 Encounter for screening for depression: Secondary | ICD-10-CM | POA: Diagnosis not present

## 2017-12-04 DIAGNOSIS — Z1339 Encounter for screening examination for other mental health and behavioral disorders: Secondary | ICD-10-CM | POA: Diagnosis not present

## 2017-12-04 DIAGNOSIS — E7800 Pure hypercholesterolemia, unspecified: Secondary | ICD-10-CM | POA: Diagnosis not present

## 2017-12-06 DIAGNOSIS — E7800 Pure hypercholesterolemia, unspecified: Secondary | ICD-10-CM | POA: Diagnosis not present

## 2017-12-06 DIAGNOSIS — E669 Obesity, unspecified: Secondary | ICD-10-CM | POA: Diagnosis not present

## 2017-12-10 ENCOUNTER — Other Ambulatory Visit: Payer: Self-pay | Admitting: Family Medicine

## 2017-12-13 ENCOUNTER — Other Ambulatory Visit: Payer: Self-pay | Admitting: Family Medicine

## 2017-12-13 DIAGNOSIS — E559 Vitamin D deficiency, unspecified: Secondary | ICD-10-CM | POA: Diagnosis not present

## 2017-12-13 DIAGNOSIS — E669 Obesity, unspecified: Secondary | ICD-10-CM | POA: Diagnosis not present

## 2017-12-13 NOTE — Telephone Encounter (Signed)
Last OV 07/28/17 Clonazepam last filled 02/23/17 #60 with 1

## 2017-12-21 DIAGNOSIS — E7800 Pure hypercholesterolemia, unspecified: Secondary | ICD-10-CM | POA: Diagnosis not present

## 2017-12-21 DIAGNOSIS — E669 Obesity, unspecified: Secondary | ICD-10-CM | POA: Diagnosis not present

## 2017-12-28 DIAGNOSIS — E7800 Pure hypercholesterolemia, unspecified: Secondary | ICD-10-CM | POA: Diagnosis not present

## 2017-12-28 DIAGNOSIS — E669 Obesity, unspecified: Secondary | ICD-10-CM | POA: Diagnosis not present

## 2017-12-28 DIAGNOSIS — E559 Vitamin D deficiency, unspecified: Secondary | ICD-10-CM | POA: Diagnosis not present

## 2018-01-05 DIAGNOSIS — E559 Vitamin D deficiency, unspecified: Secondary | ICD-10-CM | POA: Diagnosis not present

## 2018-01-05 DIAGNOSIS — E669 Obesity, unspecified: Secondary | ICD-10-CM | POA: Diagnosis not present

## 2018-01-12 DIAGNOSIS — Z713 Dietary counseling and surveillance: Secondary | ICD-10-CM | POA: Diagnosis not present

## 2018-01-12 DIAGNOSIS — E559 Vitamin D deficiency, unspecified: Secondary | ICD-10-CM | POA: Diagnosis not present

## 2018-01-12 DIAGNOSIS — R748 Abnormal levels of other serum enzymes: Secondary | ICD-10-CM | POA: Diagnosis not present

## 2018-01-19 DIAGNOSIS — E7800 Pure hypercholesterolemia, unspecified: Secondary | ICD-10-CM | POA: Diagnosis not present

## 2018-01-19 DIAGNOSIS — E669 Obesity, unspecified: Secondary | ICD-10-CM | POA: Diagnosis not present

## 2018-01-23 ENCOUNTER — Ambulatory Visit: Payer: BLUE CROSS/BLUE SHIELD | Admitting: Family Medicine

## 2018-01-23 DIAGNOSIS — K5730 Diverticulosis of large intestine without perforation or abscess without bleeding: Secondary | ICD-10-CM | POA: Diagnosis not present

## 2018-01-23 DIAGNOSIS — K5904 Chronic idiopathic constipation: Secondary | ICD-10-CM | POA: Diagnosis not present

## 2018-01-23 DIAGNOSIS — Z1211 Encounter for screening for malignant neoplasm of colon: Secondary | ICD-10-CM | POA: Diagnosis not present

## 2018-01-23 DIAGNOSIS — K7581 Nonalcoholic steatohepatitis (NASH): Secondary | ICD-10-CM | POA: Diagnosis not present

## 2018-01-26 ENCOUNTER — Other Ambulatory Visit: Payer: Self-pay

## 2018-01-26 ENCOUNTER — Ambulatory Visit (INDEPENDENT_AMBULATORY_CARE_PROVIDER_SITE_OTHER): Payer: BLUE CROSS/BLUE SHIELD | Admitting: Family Medicine

## 2018-01-26 ENCOUNTER — Encounter: Payer: Self-pay | Admitting: Family Medicine

## 2018-01-26 VITALS — BP 128/81 | HR 78 | Temp 98.3°F | Resp 17 | Ht 64.0 in | Wt 193.0 lb

## 2018-01-26 DIAGNOSIS — E669 Obesity, unspecified: Secondary | ICD-10-CM | POA: Diagnosis not present

## 2018-01-26 DIAGNOSIS — E785 Hyperlipidemia, unspecified: Secondary | ICD-10-CM | POA: Diagnosis not present

## 2018-01-26 DIAGNOSIS — Z6833 Body mass index (BMI) 33.0-33.9, adult: Secondary | ICD-10-CM

## 2018-01-26 DIAGNOSIS — E6609 Other obesity due to excess calories: Secondary | ICD-10-CM

## 2018-01-26 DIAGNOSIS — M2550 Pain in unspecified joint: Secondary | ICD-10-CM | POA: Diagnosis not present

## 2018-01-26 DIAGNOSIS — Z23 Encounter for immunization: Secondary | ICD-10-CM

## 2018-01-26 DIAGNOSIS — E7800 Pure hypercholesterolemia, unspecified: Secondary | ICD-10-CM | POA: Diagnosis not present

## 2018-01-26 LAB — LIPID PANEL
CHOL/HDL RATIO: 2
Cholesterol: 134 mg/dL (ref 0–200)
HDL: 61 mg/dL (ref >39.00)
LDL Cholesterol: 54 mg/dL (ref 0–99)
NONHDL: 72.83
Triglycerides: 92 mg/dL (ref 0.0–149.0)
VLDL: 18.4 mg/dL (ref 0.0–40.0)

## 2018-01-26 LAB — BASIC METABOLIC PANEL
BUN: 20 mg/dL (ref 6–23)
CALCIUM: 10.7 mg/dL — AB (ref 8.4–10.5)
CO2: 27 meq/L (ref 19–32)
CREATININE: 1.02 mg/dL (ref 0.40–1.20)
Chloride: 104 mEq/L (ref 96–112)
GFR: 58.18 mL/min — ABNORMAL LOW (ref >60.00)
GLUCOSE: 86 mg/dL (ref 70–99)
Potassium: 3.7 mEq/L (ref 3.5–5.1)
Sodium: 139 mEq/L (ref 135–145)

## 2018-01-26 LAB — HEPATIC FUNCTION PANEL
ALBUMIN: 4.7 g/dL (ref 3.5–5.2)
ALK PHOS: 81 U/L (ref 39–117)
ALT: 78 U/L — ABNORMAL HIGH (ref 0–35)
AST: 79 U/L — AB (ref 0–37)
BILIRUBIN DIRECT: 0.2 mg/dL (ref 0.0–0.3)
BILIRUBIN TOTAL: 0.6 mg/dL (ref 0.2–1.2)
Total Protein: 8.3 g/dL (ref 6.0–8.3)

## 2018-01-26 NOTE — Patient Instructions (Signed)
Schedule your complete physical in 6 months We'll notify you of your lab results and make any changes if needed Keep up the good work on healthy diet and regular exercise- you're doing great!!! Call with any questions or concerns Happy Holidays!!!

## 2018-01-26 NOTE — Assessment & Plan Note (Signed)
Chronic problem.  Tolerating statin w/o difficulty.  Check labs.  Adjust meds prn  

## 2018-01-26 NOTE — Progress Notes (Signed)
   Subjective:    Patient ID: Christie Thomas, female    DOB: 12/25/1955, 62 y.o.   MRN: [id number]  HPI Hyperlipidemia- chronic problem.  On Crestor 10mg  daily and Fenofibrate 160mg  daily.  Pt is down 15 lbs since last visit (started phentermine and progesterone w/ [facility])  No CP, SOB, HAs, visual changes, edema, abd pain, N/V.  Obesity- chronic problem, pt is down 15 lbs since starting Phentermine and progesterone w/ [facility].  Is now on CPAP.  Improved energy, 'feeling better'.  Pt has eliminated sugars and grains.  Eating 1000 calories/day.  Exercising daily.     Review of Systems For ROS see HPI     Objective:   Physical Exam  Constitutional: She is oriented to person, place, and time. She appears well-developed and well-nourished. No distress.  obese  HENT:  Head: Normocephalic and atraumatic.  Eyes: Pupils are equal, round, and reactive to light. Conjunctivae and EOM are normal.  Neck: Normal range of motion. Neck supple. No thyromegaly present.  Cardiovascular: Normal rate, regular rhythm, normal heart sounds and intact distal pulses.  No murmur heard. Pulmonary/Chest: Effort normal and breath sounds normal. No respiratory distress.  Abdominal: Soft. She exhibits no distension. There is no tenderness.  Musculoskeletal: She exhibits no edema.  Lymphadenopathy:    She has no cervical adenopathy.  Neurological: She is alert and oriented to person, place, and time.  Skin: Skin is warm and dry.  Psychiatric: She has a normal mood and affect. Her behavior is normal.  Vitals reviewed.         Assessment & Plan:

## 2018-01-26 NOTE — Assessment & Plan Note (Signed)
Pt has lost 15 lbs since last visit.  Is doing HRT and weight loss program through [facility].  Exercising regularly.  Applauded her efforts.  Check labs.  Will continue to follow.

## 2018-01-30 ENCOUNTER — Encounter: Payer: Self-pay | Admitting: General Practice

## 2018-02-02 DIAGNOSIS — E669 Obesity, unspecified: Secondary | ICD-10-CM | POA: Diagnosis not present

## 2018-02-02 DIAGNOSIS — E7800 Pure hypercholesterolemia, unspecified: Secondary | ICD-10-CM | POA: Diagnosis not present

## 2018-02-09 DIAGNOSIS — E669 Obesity, unspecified: Secondary | ICD-10-CM | POA: Diagnosis not present

## 2018-02-09 DIAGNOSIS — M2550 Pain in unspecified joint: Secondary | ICD-10-CM | POA: Diagnosis not present

## 2018-02-09 DIAGNOSIS — R748 Abnormal levels of other serum enzymes: Secondary | ICD-10-CM | POA: Diagnosis not present

## 2018-02-09 DIAGNOSIS — E7800 Pure hypercholesterolemia, unspecified: Secondary | ICD-10-CM | POA: Diagnosis not present

## 2018-02-21 ENCOUNTER — Encounter: Payer: Self-pay | Admitting: Gastroenterology

## 2018-02-21 LAB — HM COLONOSCOPY

## 2018-02-27 ENCOUNTER — Encounter: Payer: Self-pay | Admitting: General Practice

## 2018-03-19 ENCOUNTER — Other Ambulatory Visit: Payer: Self-pay

## 2018-03-19 ENCOUNTER — Encounter: Payer: Self-pay | Admitting: Family Medicine

## 2018-03-19 ENCOUNTER — Ambulatory Visit (INDEPENDENT_AMBULATORY_CARE_PROVIDER_SITE_OTHER): Payer: BLUE CROSS/BLUE SHIELD | Admitting: Family Medicine

## 2018-03-19 VITALS — BP 116/74 | HR 92 | Temp 98.2°F | Resp 16 | Ht 64.0 in | Wt 183.2 lb

## 2018-03-19 DIAGNOSIS — N3001 Acute cystitis with hematuria: Secondary | ICD-10-CM | POA: Diagnosis not present

## 2018-03-19 DIAGNOSIS — R300 Dysuria: Secondary | ICD-10-CM

## 2018-03-19 LAB — CBC WITH DIFFERENTIAL/PLATELET
BASOS ABS: 0 10*3/uL (ref 0.0–0.1)
Basophils Relative: 0.4 % (ref 0.0–3.0)
EOS ABS: 0.3 10*3/uL (ref 0.0–0.7)
Eosinophils Relative: 2.5 % (ref 0.0–5.0)
HEMATOCRIT: 45.2 % (ref 36.0–46.0)
Hemoglobin: 15.2 g/dL — ABNORMAL HIGH (ref 12.0–15.0)
Lymphocytes Relative: 13.8 % (ref 12.0–46.0)
Lymphs Abs: 1.6 10*3/uL (ref 0.7–4.0)
MCHC: 33.7 g/dL (ref 30.0–36.0)
MCV: 88.3 fl (ref 78.0–100.0)
MONOS PCT: 8.5 % (ref 3.0–12.0)
Monocytes Absolute: 1 10*3/uL (ref 0.1–1.0)
NEUTROS PCT: 74.8 % (ref 43.0–77.0)
Neutro Abs: 8.6 10*3/uL — ABNORMAL HIGH (ref 1.4–7.7)
Platelets: 273 10*3/uL (ref 150.0–400.0)
RBC: 5.12 Mil/uL — AB (ref 3.87–5.11)
RDW: 14 % (ref 11.5–15.5)
WBC: 11.5 10*3/uL — ABNORMAL HIGH (ref 4.0–10.5)

## 2018-03-19 LAB — POCT URINALYSIS DIPSTICK
Bilirubin, UA: NEGATIVE
Glucose, UA: NEGATIVE
Ketones, UA: NEGATIVE
Nitrite, UA: POSITIVE
PH UA: 5 (ref 5.0–8.0)
PROTEIN UA: POSITIVE — AB
Spec Grav, UA: >=1.03 — AB (ref 1.010–1.025)
UROBILINOGEN UA: 1 U/dL

## 2018-03-19 LAB — URINALYSIS, MICROSCOPIC ONLY

## 2018-03-19 MED ORDER — CIPROFLOXACIN HCL 500 MG PO TABS: 500.0000 mg | ORAL_TABLET | Freq: Two times a day (BID) | ORAL | 0 refills | Status: AC

## 2018-03-19 NOTE — Patient Instructions (Addendum)
Please follow up if symptoms do not improve or as needed.   Please start the antibiotics and advil and keep well hydrated as you are doing. Return for persistent fevers, nausea, vomiting or worsening flank pain.   I have a urine culture ordered and will call you in 2-3 days if needed. We will ensure we have the correct antibiotic.  Urinary Tract Infection, Adult  A urinary tract infection (UTI) is an infection of any part of the urinary tract. The urinary tract includes the kidneys, ureters, bladder, and urethra. These organs make, store, and get rid of urine in the body. Your health care provider may use other names to describe the infection. An upper UTI affects the ureters and kidneys (pyelonephritis). A lower UTI affects the bladder (cystitis) and urethra (urethritis). What are the causes? Most urinary tract infections are caused by bacteria in your genital area, around the entrance to your urinary tract (urethra). These bacteria grow and cause inflammation of your urinary tract. What increases the risk? You are more likely to develop this condition if:  You have a urinary catheter that stays in place (indwelling).  You are not able to control when you urinate or have a bowel movement (you have incontinence).  You are female and you: ? Use a spermicide or diaphragm for birth control. ? Have low estrogen levels. ? Are pregnant.  You have certain genes that increase your risk (genetics).  You are sexually active.  You take antibiotic medicines.  You have a condition that causes your flow of urine to slow down, such as: ? An enlarged prostate, if you are female. ? Blockage in your urethra (stricture). ? A kidney stone. ? A nerve condition that affects your bladder control (neurogenic bladder). ? Not getting enough to drink, or not urinating often.  You have certain medical conditions, such as: ? Diabetes. ? A weak disease-fighting system (immunesystem). ? Sickle cell  disease. ? Gout. ? Spinal cord injury. What are the signs or symptoms? Symptoms of this condition include:  Needing to urinate right away (urgently).  Frequent urination or passing small amounts of urine frequently.  Pain or burning with urination.  Blood in the urine.  Urine that smells bad or unusual.  Trouble urinating.  Cloudy urine.  Vaginal discharge, if you are female.  Pain in the abdomen or the lower back. You may also have:  Vomiting or a decreased appetite.  Confusion.  Irritability or tiredness.  A fever.  Diarrhea. The first symptom in older adults may be confusion. In some cases, they may not have any symptoms until the infection has worsened. How is this diagnosed? This condition is diagnosed based on your medical history and a physical exam. You may also have other tests, including:  Urine tests.  Blood tests.  Tests for sexually transmitted infections (STIs). If you have had more than one UTI, a cystoscopy or imaging studies may be done to determine the cause of the infections. How is this treated? Treatment for this condition includes:  Antibiotic medicine.  Over-the-counter medicines to treat discomfort.  Drinking enough water to stay hydrated. If you have frequent infections or have other conditions such as a kidney stone, you may need to see a health care provider who specializes in the urinary tract (urologist). In rare cases, urinary tract infections can cause sepsis. Sepsis is a life-threatening condition that occurs when the body responds to an infection. Sepsis is treated in the hospital with IV antibiotics, fluids, and other medicines.  Follow these instructions at home:  Medicines  Take over-the-counter and prescription medicines only as told by your health care provider.  If you were prescribed an antibiotic medicine, take it as told by your health care provider. Do not stop using the antibiotic even if you start to feel  better. General instructions  Make sure you: ? Empty your bladder often and completely. Do not hold urine for long periods of time. ? Empty your bladder after sex. ? Wipe from front to back after a bowel movement if you are female. Use each tissue one time when you wipe.  Drink enough fluid to keep your urine pale yellow.  Keep all follow-up visits as told by your health care provider. This is important. Contact a health care provider if:  Your symptoms do not get better after 1-2 days.  Your symptoms go away and then return. Get help right away if you have:  Severe pain in your back or your lower abdomen.  A fever.  Nausea or vomiting. Summary  A urinary tract infection (UTI) is an infection of any part of the urinary tract, which includes the kidneys, ureters, bladder, and urethra.  Most urinary tract infections are caused by bacteria in your genital area, around the entrance to your urinary tract (urethra).  Treatment for this condition often includes antibiotic medicines.  If you were prescribed an antibiotic medicine, take it as told by your health care provider. Do not stop using the antibiotic even if you start to feel better.  Keep all follow-up visits as told by your health care provider. This is important. This information is not intended to replace advice given to you by your health care provider. Make sure you discuss any questions you have with your health care provider. Document Released: 12/15/2004 Document Revised: 09/14/2017 Document Reviewed: 09/14/2017 Elsevier Interactive Patient Education  2019 [employer].

## 2018-03-19 NOTE — Progress Notes (Signed)
Subjective   CC:  Chief Complaint  Patient presents with  . Urinary Tract Infection    Symptoms started 03/16/18.. Taking OTC Azo and cranberry.     HPI: Christie Thomas is a 62 y.o. female who presents to the office today to address the problems listed above in the chief complaint.  Patient reports that she has been dealing with a URI with cough, subjective fevers and PND - mostly improved. However 3 days dysuria and urinary frequency/hesitence started. Now with suprapubic pain and worsening dysuria and bloods ('tissue and clots') in urine.  She has sensation of increased urinary pressure.  She denies flank pain nausea vomiting She denies history of interstitial cystitis.  No h/o pyelo or renal stones. denies renal colic. She denies vaginal symptoms including vaginal discharge or pelvic pain.   Pt has been on AZO so dip is not accurate; however urine does have small stringy clots in it.   Assessment  1. Acute hemorrhagic cystitis   2. Dysuria      Plan   Cystitis vs early pyelo: education given. cipro x 7 days. advil and fluids. Check CBC and urine micro and urine culture. Monitor for worsening sxs.   Follow up: Return if symptoms worsen or fail to improve.  Orders Placed This Encounter  Procedures  . Urine Culture  . Urine Microscopic Only  . CBC with Differential/Platelet  . POCT Urinalysis Dipstick   Meds ordered this encounter  Medications  . ciprofloxacin (CIPRO) 500 MG tablet    Sig: Take 1 tablet (500 mg total) by mouth 2 (two) times daily for 7 days.    Dispense:  14 tablet    Refill:  0      I reviewed the patients updated PMH, FH, and SocHx.    Patient Active Problem List   Diagnosis Date Noted  . OSA (obstructive sleep apnea) 09/07/2017  . Rheumatoid arthritis of multiple sites with negative rheumatoid factor ([place]) 11/17/2016  . High risk medication use 11/17/2016  . Elevated LFTs/ Dr Collene Mares follows  11/17/2016  . Anosmia 08/02/2016  . High risk  medications (not anticoagulants) long-term use 06/15/2016  . Pain in left ankle and joints of left foot 01/22/2016  . ANA positive 01/21/2016  . Swelling of ankle joint, left 01/20/2016  . Osteoarthritis of both hands 01/20/2016  . GERD (gastroesophageal reflux disease) 01/20/2016  . Diverticulosis 01/20/2016  . Fatty liver 01/20/2016  . IBS (irritable bowel syndrome) 01/20/2016  . Obesity 01/20/2016  . History of epilepsy 01/20/2016  . Vaginal dryness, menopausal 03/04/2015  . Plantar fasciitis of right foot 08/27/2013  . Routine general medical examination at a health care facility 05/29/2012  . Anxiety and depression 01/12/2011  . Hyperlipidemia 08/10/2010  . Gastroparesis 08/10/2010  . Asthma 08/10/2010  . Allergic rhinitis 08/10/2010   Current Meds  Medication Sig  . albuterol (PROVENTIL HFA;VENTOLIN HFA) 108 (90 Base) MCG/ACT inhaler Inhale 2 puffs into the lungs every 4 (four) hours as needed.  . AMBULATORY NON FORMULARY MEDICATION Ultra flora IB -- once daily   . Cholecalciferol (VITAMIN D PO) Take by mouth.  . clonazePAM (KLONOPIN) 0.5 MG tablet TAKE 1 TABLET BY MOUTH THREE TIMES A DAY IF NEEDED FOR ANXIETY  . fenofibrate 160 MG tablet TAKE 1 TABLET BY MOUTH EVERY DAY WITH FOOD  . fluticasone (FLONASE) 50 MCG/ACT nasal spray instill 1 to 2 sprays into each nostril daily (Patient taking differently: Use as Directed as Needed)  . phentermine 15 MG capsule TK  1 C PO QD  . progesterone (PROMETRIUM) 100 MG capsule TK 1 C PO HS  . QVAR 40 MCG/ACT inhaler inhale 2 puffs INTO THE LUNGS BY MOUTH twice a day  . rosuvastatin (CRESTOR) 10 MG tablet Take 1 tablet (10 mg total) by mouth daily.  . TURMERIC CURCUMIN PO Take 500 mg by mouth 2 (two) times daily.    Review of Systems: Cardiovascular: negative for chest pain Respiratory: negative for SOB or persistent cough Gastrointestinal: negative for abdominal pain Constitutional: Negative for fever malaise or anorexia  Objective    Vitals: BP 116/74   Pulse 92   Temp 98.2 F (36.8 C) (Oral)   Resp 16   Ht 5\' 4"  (1.626 m)   Wt 183 lb 3.2 oz (83.1 kg)   SpO2 98%   BMI 31.45 kg/m  General: nontoxic, appears to have mild discomfort Psych:  Alert and oriented, normal mood and affect Cardiovascular:  RRR without murmur or gallop. no peripheral edema Respiratory:  Good breath sounds bilaterally, CTAB with normal respiratory effort Gastrointestinal: soft, flat abdomen, normal active bowel sounds, no palpable masses, no hepatosplenomegaly, no appreciated hernias, + mild left CVAT, +suprapubic ttp w/o rebound or guarding Skin:  Warm, no rashes Neurologic:   Mental status is normal. normal gait Office Visit on 03/19/2018  Component Date Value Ref Range Status  . Color, UA 03/19/2018 brown   Final  . Clarity, UA 03/19/2018 cloudy   Final  . Glucose, UA 03/19/2018 Negative  Negative Final  . Bilirubin, UA 03/19/2018 negative   Final  . Ketones, UA 03/19/2018 negative   Final  . Spec Grav, UA 03/19/2018 >=1.030* 1.010 - 1.025 Final  . Blood, UA 03/19/2018 3+   Final  . pH, UA 03/19/2018 5.0  5.0 - 8.0 Final  . Protein, UA 03/19/2018 Positive* Negative Final  . Urobilinogen, UA 03/19/2018 1.0  0.2 or 1.0 E.U./dL Final  . Nitrite, UA 03/19/2018 positive   Final  . Leukocytes, UA 03/19/2018 Large (3+)* Negative Final  ON AZO  Commons side effects, risks, benefits, and alternatives for medications and treatment plan prescribed today were discussed, and the patient expressed understanding of the given instructions. Patient is instructed to call or message via MyChart if he/she has any questions or concerns regarding our treatment plan. No barriers to understanding were identified. We discussed Red Flag symptoms and signs in detail. Patient expressed understanding regarding what to do in case of urgent or emergency type symptoms.   Medication list was reconciled, printed and provided to the patient in AVS. Patient instructions  and summary information was reviewed with the patient as documented in the AVS. This note was prepared with assistance of Dragon voice recognition software. Occasional wrong-word or sound-a-like substitutions may have occurred due to the inherent limitations of voice recognition software

## 2018-03-19 NOTE — Progress Notes (Signed)
C/w cystitis vs early pyelo. Treated. See note.

## 2018-03-21 LAB — URINE CULTURE
MICRO NUMBER: 91551958
SPECIMEN QUALITY: ADEQUATE

## 2018-04-11 ENCOUNTER — Other Ambulatory Visit: Payer: Self-pay | Admitting: Family Medicine

## 2018-05-10 DIAGNOSIS — Z6831 Body mass index (BMI) 31.0-31.9, adult: Secondary | ICD-10-CM | POA: Diagnosis not present

## 2018-05-10 DIAGNOSIS — E7800 Pure hypercholesterolemia, unspecified: Secondary | ICD-10-CM | POA: Diagnosis not present

## 2018-05-24 DIAGNOSIS — Z6831 Body mass index (BMI) 31.0-31.9, adult: Secondary | ICD-10-CM | POA: Diagnosis not present

## 2018-05-24 DIAGNOSIS — E7800 Pure hypercholesterolemia, unspecified: Secondary | ICD-10-CM | POA: Diagnosis not present

## 2018-05-30 ENCOUNTER — Other Ambulatory Visit: Payer: Self-pay | Admitting: Family Medicine

## 2018-05-30 MED ORDER — CLONAZEPAM 0.5 MG PO TABS: ORAL_TABLET | ORAL | 1 refills | Status: DC

## 2018-05-30 NOTE — Telephone Encounter (Signed)
Last OV 03/19/18 Clonazepam last filled 12/13/17 #60 with 0

## 2018-06-08 DIAGNOSIS — R748 Abnormal levels of other serum enzymes: Secondary | ICD-10-CM | POA: Diagnosis not present

## 2018-06-08 DIAGNOSIS — E7800 Pure hypercholesterolemia, unspecified: Secondary | ICD-10-CM | POA: Diagnosis not present

## 2018-06-08 DIAGNOSIS — Z6831 Body mass index (BMI) 31.0-31.9, adult: Secondary | ICD-10-CM | POA: Diagnosis not present

## 2018-06-08 DIAGNOSIS — G479 Sleep disorder, unspecified: Secondary | ICD-10-CM | POA: Diagnosis not present

## 2018-06-08 DIAGNOSIS — F419 Anxiety disorder, unspecified: Secondary | ICD-10-CM | POA: Diagnosis not present

## 2018-06-08 DIAGNOSIS — R5383 Other fatigue: Secondary | ICD-10-CM | POA: Diagnosis not present

## 2018-06-08 DIAGNOSIS — M2550 Pain in unspecified joint: Secondary | ICD-10-CM | POA: Diagnosis not present

## 2018-06-08 DIAGNOSIS — N951 Menopausal and female climacteric states: Secondary | ICD-10-CM | POA: Diagnosis not present

## 2018-06-15 DIAGNOSIS — N898 Other specified noninflammatory disorders of vagina: Secondary | ICD-10-CM | POA: Diagnosis not present

## 2018-06-15 DIAGNOSIS — N951 Menopausal and female climacteric states: Secondary | ICD-10-CM | POA: Diagnosis not present

## 2018-06-15 DIAGNOSIS — Z6831 Body mass index (BMI) 31.0-31.9, adult: Secondary | ICD-10-CM | POA: Diagnosis not present

## 2018-06-15 DIAGNOSIS — R4586 Emotional lability: Secondary | ICD-10-CM | POA: Diagnosis not present

## 2018-07-31 ENCOUNTER — Other Ambulatory Visit: Payer: Self-pay | Admitting: Family Medicine

## 2018-08-10 ENCOUNTER — Encounter: Payer: BLUE CROSS/BLUE SHIELD | Admitting: Family Medicine

## 2018-08-28 DIAGNOSIS — M2550 Pain in unspecified joint: Secondary | ICD-10-CM | POA: Diagnosis not present

## 2018-08-28 DIAGNOSIS — Z6831 Body mass index (BMI) 31.0-31.9, adult: Secondary | ICD-10-CM | POA: Diagnosis not present

## 2018-09-04 DIAGNOSIS — M2550 Pain in unspecified joint: Secondary | ICD-10-CM | POA: Diagnosis not present

## 2018-09-04 DIAGNOSIS — E7800 Pure hypercholesterolemia, unspecified: Secondary | ICD-10-CM | POA: Diagnosis not present

## 2018-09-04 DIAGNOSIS — R748 Abnormal levels of other serum enzymes: Secondary | ICD-10-CM | POA: Diagnosis not present

## 2018-09-04 DIAGNOSIS — Z6830 Body mass index (BMI) 30.0-30.9, adult: Secondary | ICD-10-CM | POA: Diagnosis not present

## 2018-09-10 ENCOUNTER — Telehealth: Payer: BLUE CROSS/BLUE SHIELD | Admitting: Physician Assistant

## 2018-09-10 ENCOUNTER — Encounter: Payer: Self-pay | Admitting: Physician Assistant

## 2018-09-10 ENCOUNTER — Telehealth: Payer: Self-pay | Admitting: Family Medicine

## 2018-09-10 DIAGNOSIS — N390 Urinary tract infection, site not specified: Secondary | ICD-10-CM

## 2018-09-10 DIAGNOSIS — M549 Dorsalgia, unspecified: Secondary | ICD-10-CM

## 2018-09-10 NOTE — Telephone Encounter (Signed)
Please advise 

## 2018-09-10 NOTE — Telephone Encounter (Signed)
Pt had a e-visit today and they recommended that she see her pcp for a UTI, please advise if you would like pt to come in for an office visit or if she can just come in for a nurse visit to leave a UA? Pt can be reached at the home #

## 2018-09-10 NOTE — Telephone Encounter (Signed)
If pt passed screening, PCP would prefer her to be seen. Can offer appt with Einar Pheasant if he is ok with it

## 2018-09-10 NOTE — Telephone Encounter (Signed)
In office visit if pt clears screening

## 2018-09-10 NOTE — Telephone Encounter (Signed)
Christie Thomas is booked tomorrow do you think it is ok for pt to come in for just a UA or should she be ok to wait until Wednesday

## 2018-09-10 NOTE — Progress Notes (Signed)
Based on what you shared with me, I feel your condition warrants further evaluation and I recommend that you be seen for a face to face office visit.   Ms. Christie Thomas Symptoms of UTI and back pain can indicated that a simple UTI may be traveling up to your kidneys. Due to this, I recommend having a face to face evaluation with your doctor for the appropriate workup. Call you doctor's office or message them through [place].     NOTE: If you entered your credit card information for this eVisit, you will not be charged. You may see a "hold" on your card for the $35 but that hold will drop off and you will not have a charge processed.  If you are having a true medical emergency please call 911.     For an urgent face to face visit, [facility] has five urgent care centers for your convenience:    [email or web address] to reserve your spot online an avoid wait times  [facility] [address], [address] [place], [place] [address] Modified hours of operation: Monday-Friday, 12 PM to 6 PM  Closed Saturday & Sunday  *Across the street from [place] (New Address!) [address] [place], [place] [address] *Just off [address], across the road from [place] hours of operation: Monday-Friday, 12 PM to 6 PM  Closed Saturday & Sunday   The following sites will take your insurance:  . [facility]    [phone]                  Get Driving Directions  [address], [place] [address] . 10 am to 8 pm Monday-Friday . 12 pm to 8 pm Saturday-Sunday   . [facility] at [place]                  Get Driving Directions  [address], [place], [place] [address] . 8 am to 8 pm Monday-Friday . 9 am to 6 pm Saturday . 11 am to 6 pm Sunday   . [facility] at [place]                  Get  Driving Directions   [address], [place] [address] . 8 am to 8 pm Monday-Friday . 8 am to 4 pm Saturday-Sunday    . [facility] at [place]                    Get Driving Directions  [phone]  [address] [place], [place] [address]  . Monday-Friday, 12 PM to 6 PM    Your e-visit answers were reviewed by a board certified advanced clinical practitioner to complete your personal care plan.  Thank you for using e-Visits. I spent 5-10 minutes on review and completion of this note- Lacy Duverney [facility]

## 2018-09-11 ENCOUNTER — Ambulatory Visit (INDEPENDENT_AMBULATORY_CARE_PROVIDER_SITE_OTHER): Payer: BC Managed Care – PPO | Admitting: Physician Assistant

## 2018-09-11 ENCOUNTER — Other Ambulatory Visit: Payer: Self-pay

## 2018-09-11 ENCOUNTER — Encounter: Payer: Self-pay | Admitting: Physician Assistant

## 2018-09-11 VITALS — BP 118/70 | HR 84 | Temp 98.0°F | Resp 16 | Ht 64.0 in | Wt 184.0 lb

## 2018-09-11 DIAGNOSIS — R319 Hematuria, unspecified: Secondary | ICD-10-CM | POA: Diagnosis not present

## 2018-09-11 DIAGNOSIS — N12 Tubulo-interstitial nephritis, not specified as acute or chronic: Secondary | ICD-10-CM

## 2018-09-11 DIAGNOSIS — R300 Dysuria: Secondary | ICD-10-CM | POA: Diagnosis not present

## 2018-09-11 LAB — POCT URINALYSIS DIPSTICK
Bilirubin, UA: NEGATIVE
Glucose, UA: NEGATIVE
Ketones, UA: NEGATIVE
Nitrite, UA: NEGATIVE
Protein, UA: POSITIVE — AB
Spec Grav, UA: >=1.03 — AB (ref 1.010–1.025)
Urobilinogen, UA: 0.2 E.U./dL
pH, UA: 6 (ref 5.0–8.0)

## 2018-09-11 MED ORDER — CIPROFLOXACIN HCL 500 MG PO TABS: 500.0000 mg | ORAL_TABLET | Freq: Two times a day (BID) | ORAL | 0 refills | Status: DC

## 2018-09-11 NOTE — Progress Notes (Signed)
Patient presents to clinic today c/o 3 day history. Increased urgency, hesitation, pain with urination, throbbing/ heavy pain in suprapubic area. Hx of UTI  6 mo ago. Back pain woke her up 2 nights ago, not constant.  Has noted cloudy urine, has some blood in urine today.  Staying well hydrated with three 33 oz of water.  Denies: nausea, vomiting, afebrile, diarrhea,  Has not taken anything for pain or discomfort.   Past Medical History:  Diagnosis Date  . Anxiety 01/20/2016  . Asthma   . Diverticulitis 01/20/2016  . Diverticulosis 01/20/2016  . Epilepsy ([place])   . Epilepsy ([place]) 01/20/2016   Adolescent   . Fatty liver 01/20/2016  . GERD (gastroesophageal reflux disease)   . Hay fever   . Hyperlipidemia   . IBS (irritable bowel syndrome) 01/20/2016  . Obesity 01/20/2016  . OSA (obstructive sleep apnea) 09/07/2017  . Osteoarthritis of both hands 01/20/2016  . Swelling of ankle joint, left 01/20/2016  . Trochanteric bursitis 01/20/2016    Current Outpatient Medications on File Prior to Visit  Medication Sig Dispense Refill  . albuterol (PROVENTIL HFA;VENTOLIN HFA) 108 (90 Base) MCG/ACT inhaler Inhale 2 puffs into the lungs every 4 (four) hours as needed. 1 Inhaler 6  . AMBULATORY NON FORMULARY MEDICATION Ultra flora IB -- once daily     . Cholecalciferol (VITAMIN D PO) Take by mouth.    . clonazePAM (KLONOPIN) 0.5 MG tablet TAKE 1 TABLET BY MOUTH THREE TIMES A DAY IF NEEDED FOR ANXIETY 60 tablet 1  . fenofibrate 160 MG tablet TAKE 1 TABLET BY MOUTH EVERY DAY WITH FOOD 30 tablet 3  . fluticasone (FLONASE) 50 MCG/ACT nasal spray instill 1 to 2 sprays into each nostril daily (Patient taking differently: Use as Directed as Needed) 16 g 3  . phentermine 15 MG capsule TK 1 C PO QD  0  . progesterone (PROMETRIUM) 100 MG capsule TK 1 C PO HS  0  . QVAR 40 MCG/ACT inhaler inhale 2 puffs INTO THE LUNGS BY MOUTH twice a day 8.7 g 6  . rosuvastatin (CRESTOR) 10 MG tablet TAKE 1 TABLET(10 MG) BY MOUTH  DAILY 30 tablet 3  . TURMERIC CURCUMIN PO Take 500 mg by mouth 2 (two) times daily.     No current facility-administered medications on file prior to visit.     Allergies  Allergen Reactions  . Penicillins     Throat swelling   . Shellfish Allergy Anaphylaxis  . Metoclopramide Other (See Comments)    Family History  Problem Relation Age of Onset  . Stroke Mother   . Hypertension Mother   . Hyperlipidemia Mother   . Diabetes Mother   . Cancer Mother   . Sudden death Paternal Grandfather 36  . Alzheimer's disease Father   . Heart attack Neg Hx     Social History   Socioeconomic History  . Marital status: Married    Spouse name: Not on file  . Number of children: Not on file  . Years of education: Not on file  . Highest education level: Not on file  Occupational History  . Not on file  Social Needs  . Financial resource strain: Not on file  . Food insecurity    Worry: Not on file    Inability: Not on file  . Transportation needs    Medical: Not on file    Non-medical: Not on file  Tobacco Use  . Smoking status: Never Smoker  . Smokeless tobacco:  Never Used  Substance and Sexual Activity  . Alcohol use: No  . Drug use: No  . Sexual activity: Not on file  Lifestyle  . Physical activity    Days per week: Not on file    Minutes per session: Not on file  . Stress: Not on file  Relationships  . Social Herbalist on phone: Not on file    Gets together: Not on file    Attends religious service: Not on file    Active member of club or organization: Not on file    Attends meetings of clubs or organizations: Not on file    Relationship status: Not on file  Other Topics Concern  . Not on file  Social History Narrative  . Not on file   Review of Systems - See HPI.  All other ROS are negative.  BP 118/70   Pulse 84   Temp 98 F (36.7 C) (Skin)   Resp 16   Ht 5\' 4"  (1.626 m)   Wt 184 lb (83.5 kg)   SpO2 99%   BMI 31.58 kg/m   Physical Exam  Vitals signs reviewed.  Constitutional:      Appearance: Normal appearance.  HENT:     Head: Normocephalic and atraumatic.     Right Ear: Tympanic membrane normal.     Left Ear: Tympanic membrane normal.     Mouth/Throat:     Mouth: Mucous membranes are moist.  Neck:     Musculoskeletal: Normal range of motion and neck supple.  Cardiovascular:     Rate and Rhythm: Normal rate and regular rhythm.     Pulses: Normal pulses.     Heart sounds: Normal heart sounds.  Pulmonary:     Effort: Pulmonary effort is normal.  Abdominal:     General: There is no distension.     Tenderness: There is abdominal tenderness (suprapubic). There is right CVA tenderness. There is no left CVA tenderness, guarding or rebound.  Neurological:     Mental Status: She is alert.     Recent Results (from the past 2160 hour(s))  POCT urinalysis dipstick     Status: Abnormal   Collection Time: 09/11/18 10:57 AM  Result Value Ref Range   Color, UA brown    Clarity, UA cloudy    Glucose, UA Negative Negative   Bilirubin, UA negative    Ketones, UA negative    Spec Grav, UA >=1.030 (A) 1.010 - 1.025   Blood, UA 3+    pH, UA 6.0 5.0 - 8.0   Protein, UA Positive (A) Negative   Urobilinogen, UA 0.2 0.2 or 1.0 E.U./dL   Nitrite, UA negative    Leukocytes, UA Moderate (2+) (A) Negative   Appearance     Odor      Assessment/Plan: 1. Pyelonephritis 2. Hematuria, unspecified type Mild. Afebrile but + CVA tenderness,hematuria and classic UTI symptoms. Urine dip + blood, LE and some protein, similar to dip with last UA. Culture sent. Start empiric treatment with Cipro 500 mg BID x 7 days. Supportive measures reviewed.   - Urine Culture   Leeanne Rio, PA-C

## 2018-09-11 NOTE — Patient Instructions (Signed)
Your symptoms are consistent with a bladder infection, also called acute cystitis. Please take your antibiotic (Cipro) as directed until all pills are gone.  Stay very well hydrated.  Consider a daily probiotic (Align, Culturelle, or Activia) to help prevent stomach upset caused by the antibiotic.  Taking a probiotic daily may also help prevent recurrent UTIs.  Also consider taking AZO (Phenazopyridine) tablets to help decrease pain with urination.  I will call you with your urine testing results.  We will change antibiotics if indicated.  Call or return to clinic if symptoms are not resolved by completion of antibiotic.    Pyelonephritis, Adult  Pyelonephritis is a kidney infection. The kidneys are organs that help clean your blood by moving waste out of your blood and into your pee (urine). This infection can happen quickly, or it can last for a long time. In most cases, it clears up with treatment and does not cause other problems. Follow these instructions at home: Medicines  Take over-the-counter and prescription medicines only as told by your doctor.  Take your antibiotic medicine as told by your doctor. Do not stop taking the medicine even if you start to feel better. General instructions  Drink enough fluid to keep your pee clear or pale yellow.  Avoid caffeine, tea, and carbonated drinks.  Pee (urinate) often. Avoid holding in pee for long periods of time.  Pee before and after sex.  After pooping (having a bowel movement), women should wipe from front to back. Use each tissue only once.  Keep all follow-up visits as told by your doctor. This is important. Contact a doctor if:  You do not feel better after 2 days.  Your symptoms get worse.  You have a fever. Get help right away if:  You cannot take your medicine or drink fluids as told.  You have chills and shaking.  You throw up (vomit).  You have very bad pain in your side (flank) or back.  You feel very weak or  you pass out (faint). This information is not intended to replace advice given to you by your health care provider. Make sure you discuss any questions you have with your health care provider. Document Released: 04/14/2004 Document Revised: 08/13/2015 Document Reviewed: 06/30/2014 Elsevier Interactive Patient Education  [employer].

## 2018-09-13 DIAGNOSIS — N898 Other specified noninflammatory disorders of vagina: Secondary | ICD-10-CM | POA: Diagnosis not present

## 2018-09-13 DIAGNOSIS — N951 Menopausal and female climacteric states: Secondary | ICD-10-CM | POA: Diagnosis not present

## 2018-09-13 DIAGNOSIS — R5383 Other fatigue: Secondary | ICD-10-CM | POA: Diagnosis not present

## 2018-09-13 DIAGNOSIS — R4586 Emotional lability: Secondary | ICD-10-CM | POA: Diagnosis not present

## 2018-09-13 LAB — URINE CULTURE
MICRO NUMBER:: 598610
SPECIMEN QUALITY:: ADEQUATE

## 2018-09-17 DIAGNOSIS — Z6831 Body mass index (BMI) 31.0-31.9, adult: Secondary | ICD-10-CM | POA: Diagnosis not present

## 2018-09-17 DIAGNOSIS — E7800 Pure hypercholesterolemia, unspecified: Secondary | ICD-10-CM | POA: Diagnosis not present

## 2018-09-17 DIAGNOSIS — N951 Menopausal and female climacteric states: Secondary | ICD-10-CM | POA: Diagnosis not present

## 2018-09-17 DIAGNOSIS — N898 Other specified noninflammatory disorders of vagina: Secondary | ICD-10-CM | POA: Diagnosis not present

## 2018-09-19 ENCOUNTER — Other Ambulatory Visit: Payer: Self-pay | Admitting: Family Medicine

## 2018-09-19 ENCOUNTER — Other Ambulatory Visit (HOSPITAL_BASED_OUTPATIENT_CLINIC_OR_DEPARTMENT_OTHER): Payer: Self-pay | Admitting: Family Medicine

## 2018-09-19 DIAGNOSIS — Z780 Asymptomatic menopausal state: Secondary | ICD-10-CM

## 2018-09-19 DIAGNOSIS — Z1231 Encounter for screening mammogram for malignant neoplasm of breast: Secondary | ICD-10-CM

## 2018-09-24 DIAGNOSIS — M2550 Pain in unspecified joint: Secondary | ICD-10-CM | POA: Diagnosis not present

## 2018-09-24 DIAGNOSIS — E7800 Pure hypercholesterolemia, unspecified: Secondary | ICD-10-CM | POA: Diagnosis not present

## 2018-09-24 DIAGNOSIS — Z6831 Body mass index (BMI) 31.0-31.9, adult: Secondary | ICD-10-CM | POA: Diagnosis not present

## 2018-10-11 DIAGNOSIS — E7800 Pure hypercholesterolemia, unspecified: Secondary | ICD-10-CM | POA: Diagnosis not present

## 2018-10-11 DIAGNOSIS — E669 Obesity, unspecified: Secondary | ICD-10-CM | POA: Diagnosis not present

## 2018-10-11 DIAGNOSIS — M2550 Pain in unspecified joint: Secondary | ICD-10-CM | POA: Diagnosis not present

## 2018-10-19 DIAGNOSIS — E7800 Pure hypercholesterolemia, unspecified: Secondary | ICD-10-CM | POA: Diagnosis not present

## 2018-10-19 DIAGNOSIS — Z6830 Body mass index (BMI) 30.0-30.9, adult: Secondary | ICD-10-CM | POA: Diagnosis not present

## 2018-10-22 ENCOUNTER — Encounter (HOSPITAL_BASED_OUTPATIENT_CLINIC_OR_DEPARTMENT_OTHER): Payer: Self-pay

## 2018-10-22 ENCOUNTER — Ambulatory Visit (HOSPITAL_BASED_OUTPATIENT_CLINIC_OR_DEPARTMENT_OTHER)
Admission: RE | Admit: 2018-10-22 | Discharge: 2018-10-22 | Disposition: A | Discharge status: Home / Self Care | Payer: BLUE CROSS/BLUE SHIELD | Source: Ambulatory Visit | Attending: Family Medicine | Admitting: Family Medicine

## 2018-10-22 ENCOUNTER — Other Ambulatory Visit: Payer: Self-pay

## 2018-10-22 DIAGNOSIS — Z1231 Encounter for screening mammogram for malignant neoplasm of breast: Secondary | ICD-10-CM | POA: Diagnosis not present

## 2018-10-22 DIAGNOSIS — Z780 Asymptomatic menopausal state: Secondary | ICD-10-CM | POA: Insufficient documentation

## 2018-10-22 DIAGNOSIS — M85851 Other specified disorders of bone density and structure, right thigh: Secondary | ICD-10-CM | POA: Diagnosis not present

## 2018-10-24 ENCOUNTER — Encounter: Payer: BLUE CROSS/BLUE SHIELD | Admitting: Family Medicine

## 2018-10-26 DIAGNOSIS — E7800 Pure hypercholesterolemia, unspecified: Secondary | ICD-10-CM | POA: Diagnosis not present

## 2018-10-26 DIAGNOSIS — Z6830 Body mass index (BMI) 30.0-30.9, adult: Secondary | ICD-10-CM | POA: Diagnosis not present

## 2018-11-02 DIAGNOSIS — Z6830 Body mass index (BMI) 30.0-30.9, adult: Secondary | ICD-10-CM | POA: Diagnosis not present

## 2018-11-02 DIAGNOSIS — R748 Abnormal levels of other serum enzymes: Secondary | ICD-10-CM | POA: Diagnosis not present

## 2018-11-02 DIAGNOSIS — M2550 Pain in unspecified joint: Secondary | ICD-10-CM | POA: Diagnosis not present

## 2018-11-02 DIAGNOSIS — E7800 Pure hypercholesterolemia, unspecified: Secondary | ICD-10-CM | POA: Diagnosis not present

## 2018-11-08 ENCOUNTER — Encounter: Payer: BC Managed Care – PPO | Admitting: Family Medicine

## 2018-11-09 DIAGNOSIS — Z6830 Body mass index (BMI) 30.0-30.9, adult: Secondary | ICD-10-CM | POA: Diagnosis not present

## 2018-11-09 DIAGNOSIS — M2550 Pain in unspecified joint: Secondary | ICD-10-CM | POA: Diagnosis not present

## 2018-11-21 ENCOUNTER — Ambulatory Visit (INDEPENDENT_AMBULATORY_CARE_PROVIDER_SITE_OTHER): Payer: BC Managed Care – PPO

## 2018-11-21 ENCOUNTER — Other Ambulatory Visit: Payer: Self-pay

## 2018-11-21 DIAGNOSIS — Z23 Encounter for immunization: Secondary | ICD-10-CM | POA: Diagnosis not present

## 2018-11-23 DIAGNOSIS — Z6830 Body mass index (BMI) 30.0-30.9, adult: Secondary | ICD-10-CM | POA: Diagnosis not present

## 2018-11-23 DIAGNOSIS — E7800 Pure hypercholesterolemia, unspecified: Secondary | ICD-10-CM | POA: Diagnosis not present

## 2018-12-03 ENCOUNTER — Other Ambulatory Visit: Payer: Self-pay | Admitting: General Practice

## 2018-12-03 MED ORDER — FENOFIBRATE 160 MG PO TABS: 160.0000 mg | ORAL_TABLET | Freq: Every day | ORAL | 3 refills | Status: DC

## 2018-12-03 MED ORDER — ROSUVASTATIN CALCIUM 10 MG PO TABS: ORAL_TABLET | ORAL | 3 refills | Status: DC

## 2018-12-17 DIAGNOSIS — N898 Other specified noninflammatory disorders of vagina: Secondary | ICD-10-CM | POA: Diagnosis not present

## 2018-12-17 DIAGNOSIS — N951 Menopausal and female climacteric states: Secondary | ICD-10-CM | POA: Diagnosis not present

## 2018-12-17 DIAGNOSIS — E7800 Pure hypercholesterolemia, unspecified: Secondary | ICD-10-CM | POA: Diagnosis not present

## 2018-12-17 DIAGNOSIS — R5383 Other fatigue: Secondary | ICD-10-CM | POA: Diagnosis not present

## 2018-12-19 DIAGNOSIS — R5383 Other fatigue: Secondary | ICD-10-CM | POA: Diagnosis not present

## 2018-12-19 DIAGNOSIS — N951 Menopausal and female climacteric states: Secondary | ICD-10-CM | POA: Diagnosis not present

## 2018-12-19 DIAGNOSIS — G479 Sleep disorder, unspecified: Secondary | ICD-10-CM | POA: Diagnosis not present

## 2018-12-28 ENCOUNTER — Encounter: Payer: Self-pay | Admitting: Family Medicine

## 2019-01-02 DIAGNOSIS — L820 Inflamed seborrheic keratosis: Secondary | ICD-10-CM | POA: Diagnosis not present

## 2019-01-02 DIAGNOSIS — D485 Neoplasm of uncertain behavior of skin: Secondary | ICD-10-CM | POA: Diagnosis not present

## 2019-01-21 DIAGNOSIS — H43813 Vitreous degeneration, bilateral: Secondary | ICD-10-CM | POA: Diagnosis not present

## 2019-02-07 ENCOUNTER — Other Ambulatory Visit: Payer: Self-pay

## 2019-02-07 ENCOUNTER — Ambulatory Visit (INDEPENDENT_AMBULATORY_CARE_PROVIDER_SITE_OTHER): Payer: BC Managed Care – PPO | Admitting: Family Medicine

## 2019-02-07 ENCOUNTER — Encounter: Payer: Self-pay | Admitting: Family Medicine

## 2019-02-07 ENCOUNTER — Other Ambulatory Visit (HOSPITAL_COMMUNITY)
Admission: RE | Admit: 2019-02-07 | Discharge: 2019-02-07 | Disposition: A | Discharge status: Home / Self Care | Payer: BC Managed Care – PPO | Source: Ambulatory Visit | Attending: Family Medicine | Admitting: Family Medicine

## 2019-02-07 VITALS — BP 121/81 | HR 76 | Temp 97.5°F | Resp 16 | Ht 64.0 in | Wt 187.2 lb

## 2019-02-07 DIAGNOSIS — Z124 Encounter for screening for malignant neoplasm of cervix: Secondary | ICD-10-CM | POA: Insufficient documentation

## 2019-02-07 DIAGNOSIS — Z6832 Body mass index (BMI) 32.0-32.9, adult: Secondary | ICD-10-CM | POA: Diagnosis not present

## 2019-02-07 DIAGNOSIS — M0609 Rheumatoid arthritis without rheumatoid factor, multiple sites: Secondary | ICD-10-CM | POA: Diagnosis not present

## 2019-02-07 DIAGNOSIS — Z0000 Encounter for general adult medical examination without abnormal findings: Secondary | ICD-10-CM

## 2019-02-07 DIAGNOSIS — E6609 Other obesity due to excess calories: Secondary | ICD-10-CM | POA: Diagnosis not present

## 2019-02-07 NOTE — Patient Instructions (Addendum)
Follow up in 6 months to recheck cholesterol We'll notify you of your lab results and make any changes if needed Continue to work on healthy diet and regular exercise- you look great! Call with any questions or concerns Stay Safe!  Stay Healthy! Happy Early Rudene Anda!!

## 2019-02-07 NOTE — Assessment & Plan Note (Signed)
Pt's PE WNL w/ exception of obesity.  UTD on mammo, colonoscopy, immunizations.  Pap done today.  Check labs.  Anticipatory guidance provided.  

## 2019-02-07 NOTE — Assessment & Plan Note (Signed)
Following w/ Rheum 

## 2019-02-07 NOTE — Assessment & Plan Note (Signed)
Pt is working on healthy diet and regular exercise and is down quite a bit from her previous high.  Applauded her efforts.  Check labs to risk stratify.  Will follow.

## 2019-02-07 NOTE — Progress Notes (Signed)
   Subjective:    Patient ID: Christie Thomas, female    DOB: [birth year], 63 y.o.   MRN: [id number]  HPI CPE- UTD on mammo, colonoscopy, immunizations.  Due for pap.  Walking regularly.   Review of Systems Patient reports no vision/ hearing changes, adenopathy,fever, weight change,  persistant/recurrent hoarseness , swallowing issues, chest pain, palpitations, edema, persistant/recurrent cough, hemoptysis, dyspnea (rest/exertional/paroxysmal nocturnal), gastrointestinal bleeding (melena, rectal bleeding), abdominal pain, significant heartburn, bowel changes, GU symptoms (dysuria, hematuria, incontinence), Gyn symptoms (abnormal  bleeding, pain),  syncope, focal weakness, memory loss, numbness & tingling, skin/hair/nail changes, abnormal bruising or bleeding, anxiety, or depression.     Objective:   Physical Exam  General Appearance:    Alert, cooperative, no distress, appears stated age  Head:    Normocephalic, without obvious abnormality, atraumatic  Eyes:    PERRL, conjunctiva/corneas clear, EOM's intact, fundi    benign, both eyes  Ears:    Normal TM's and external ear canals, both ears  Nose:   Deferred due to COVID  Throat:   Neck:   Supple, symmetrical, trachea midline, no adenopathy;    Thyroid: no enlargement/tenderness/nodules  Back:     Symmetric, no curvature, ROM normal, no CVA tenderness  Lungs:     Clear to auscultation bilaterally, respirations unlabored  Chest Wall:    No tenderness or deformity   Heart:    Regular rate and rhythm, S1 and S2 normal, no murmur, rub   or gallop  Breast Exam:    No tenderness, masses, or nipple abnormality  Abdomen:     Soft, non-tender, bowel sounds active all four quadrants,    no masses, no organomegaly  Genitalia:    External genitalia normal, cervix normal in appearance, no CMT, uterus in normal size and position, adnexa w/out mass or tenderness, mucosa pink and moist, no lesions or discharge present  Rectal:    Normal external  appearance  Extremities:   Extremities normal, atraumatic, no cyanosis or edema  Pulses:   2+ and symmetric all extremities  Skin:   Skin color, texture, turgor normal, no rashes or lesions  Lymph nodes:   Cervical, supraclavicular, and axillary nodes normal  Neurologic:   CNII-XII intact, normal strength, sensation and reflexes    throughout          Assessment & Plan:

## 2019-02-08 ENCOUNTER — Encounter: Payer: Self-pay | Admitting: General Practice

## 2019-02-08 LAB — CBC WITH DIFFERENTIAL/PLATELET
Basophils Absolute: 0.1 10*3/uL (ref 0.0–0.1)
Basophils Relative: 1.3 % (ref 0.0–3.0)
Eosinophils Absolute: 0.3 10*3/uL (ref 0.0–0.7)
Eosinophils Relative: 4.4 % (ref 0.0–5.0)
HCT: 46.2 % — ABNORMAL HIGH (ref 36.0–46.0)
Hemoglobin: 15.4 g/dL — ABNORMAL HIGH (ref 12.0–15.0)
Lymphocytes Relative: 40.3 % (ref 12.0–46.0)
Lymphs Abs: 3.1 10*3/uL (ref 0.7–4.0)
MCHC: 33.4 g/dL (ref 30.0–36.0)
MCV: 92 fl (ref 78.0–100.0)
Monocytes Absolute: 0.8 10*3/uL (ref 0.1–1.0)
Monocytes Relative: 10.3 % (ref 3.0–12.0)
Neutro Abs: 3.4 10*3/uL (ref 1.4–7.7)
Neutrophils Relative %: 43.7 % (ref 43.0–77.0)
Platelets: 297 10*3/uL (ref 150.0–400.0)
RBC: 5.02 Mil/uL (ref 3.87–5.11)
RDW: 12.7 % (ref 11.5–15.5)
WBC: 7.7 10*3/uL (ref 4.0–10.5)

## 2019-02-08 LAB — TSH: TSH: 1.25 u[IU]/mL (ref 0.35–4.50)

## 2019-02-08 LAB — HEPATIC FUNCTION PANEL
ALT: 66 U/L — ABNORMAL HIGH (ref 0–35)
AST: 67 U/L — ABNORMAL HIGH (ref 0–37)
Albumin: 4.5 g/dL (ref 3.5–5.2)
Alkaline Phosphatase: 67 U/L (ref 39–117)
Bilirubin, Direct: 0.2 mg/dL (ref 0.0–0.3)
Total Bilirubin: 0.8 mg/dL (ref 0.2–1.2)
Total Protein: 7.5 g/dL (ref 6.0–8.3)

## 2019-02-08 LAB — LIPID PANEL
Cholesterol: 139 mg/dL (ref 0–200)
HDL: 53.1 mg/dL (ref >39.00)
LDL Cholesterol: 66 mg/dL (ref 0–99)
NonHDL: 85.72
Total CHOL/HDL Ratio: 3
Triglycerides: 99 mg/dL (ref 0.0–149.0)
VLDL: 19.8 mg/dL (ref 0.0–40.0)

## 2019-02-11 ENCOUNTER — Encounter: Payer: Self-pay | Admitting: General Practice

## 2019-02-11 LAB — CYTOLOGY - PAP
Adequacy: ABSENT
Comment: NEGATIVE
Diagnosis: NEGATIVE
High risk HPV: NEGATIVE

## 2019-02-19 ENCOUNTER — Other Ambulatory Visit: Payer: Self-pay

## 2019-02-19 ENCOUNTER — Encounter: Payer: Self-pay | Admitting: Family Medicine

## 2019-02-19 ENCOUNTER — Ambulatory Visit (INDEPENDENT_AMBULATORY_CARE_PROVIDER_SITE_OTHER): Payer: BC Managed Care – PPO | Admitting: Family Medicine

## 2019-02-19 ENCOUNTER — Telehealth: Payer: Self-pay | Admitting: Family Medicine

## 2019-02-19 VITALS — BP 114/72 | HR 78 | Temp 98.1°F | Resp 16 | Ht 64.0 in | Wt 188.4 lb

## 2019-02-19 DIAGNOSIS — R300 Dysuria: Secondary | ICD-10-CM

## 2019-02-19 DIAGNOSIS — N3090 Cystitis, unspecified without hematuria: Secondary | ICD-10-CM

## 2019-02-19 LAB — POCT URINALYSIS DIPSTICK
Bilirubin, UA: NEGATIVE
Glucose, UA: NEGATIVE
Ketones, UA: NEGATIVE
Nitrite, UA: POSITIVE
Protein, UA: POSITIVE — AB
Spec Grav, UA: 1.025 (ref 1.010–1.025)
Urobilinogen, UA: 0.2 E.U./dL
pH, UA: 5.5 (ref 5.0–8.0)

## 2019-02-19 MED ORDER — NITROFURANTOIN MONOHYD MACRO 100 MG PO CAPS: 100.0000 mg | ORAL_CAPSULE | Freq: Two times a day (BID) | ORAL | 0 refills | Status: AC

## 2019-02-19 NOTE — Patient Instructions (Signed)
Urinary Tract Infection, Adult A urinary tract infection (UTI) is an infection of any part of the urinary tract. The urinary tract includes:  The kidneys.  The ureters.  The bladder.  The urethra. These organs make, store, and get rid of pee (urine) in the body. What are the causes? This is caused by germs (bacteria) in your genital area. These germs grow and cause swelling (inflammation) of your urinary tract. What increases the risk? You are more likely to develop this condition if:  You have a small, thin tube (catheter) to drain pee.  You cannot control when you pee or poop (incontinence).  You are female, and: ? You use these methods to prevent pregnancy: ? A medicine that kills sperm (spermicide). ? A device that blocks sperm (diaphragm). ? You have low levels of a female hormone (estrogen). ? You are pregnant.  You have genes that add to your risk.  You are sexually active.  You take antibiotic medicines.  You have trouble peeing because of: ? A prostate that is bigger than normal, if you are female. ? A blockage in the part of your body that drains pee from the bladder (urethra). ? A kidney stone. ? A nerve condition that affects your bladder (neurogenic bladder). ? Not getting enough to drink. ? Not peeing often enough.  You have other conditions, such as: ? Diabetes. ? A weak disease-fighting system (immune system). ? Sickle cell disease. ? Gout. ? Injury of the spine. What are the signs or symptoms? Symptoms of this condition include:  Needing to pee right away (urgently).  Peeing often.  Peeing small amounts often.  Pain or burning when peeing.  Blood in the pee.  Pee that smells bad or not like normal.  Trouble peeing.  Pee that is cloudy.  Fluid coming from the vagina, if you are female.  Pain in the belly or lower back. Other symptoms include:  Throwing up (vomiting).  No urge to eat.  Feeling mixed up (confused).  Being tired  and grouchy (irritable).  A fever.  Watery poop (diarrhea). How is this treated? This condition may be treated with:  Antibiotic medicine.  Other medicines.  Drinking enough water. Follow these instructions at home:  Medicines  Take over-the-counter and prescription medicines only as told by your doctor.  If you were prescribed an antibiotic medicine, take it as told by your doctor. Do not stop taking it even if you start to feel better. General instructions  Make sure you: ? Pee until your bladder is empty. ? Do not hold pee for a long time. ? Empty your bladder after sex. ? Wipe from front to back after pooping if you are a female. Use each tissue one time when you wipe.  Drink enough fluid to keep your pee pale yellow.  Keep all follow-up visits as told by your doctor. This is important. Contact a doctor if:  You do not get better after 1-2 days.  Your symptoms go away and then come back. Get help right away if:  You have very bad back pain.  You have very bad pain in your lower belly.  You have a fever.  You are sick to your stomach (nauseous).  You are throwing up. Summary  A urinary tract infection (UTI) is an infection of any part of the urinary tract.  This condition is caused by germs in your genital area.  There are many risk factors for a UTI. These include having a small, thin   tube to drain pee and not being able to control when you pee or poop.  Treatment includes antibiotic medicines for germs.  Drink enough fluid to keep your pee pale yellow. This information is not intended to replace advice given to you by your health care provider. Make sure you discuss any questions you have with your health care provider. Document Released: 08/24/2007 Document Revised: 02/22/2018 Document Reviewed: 09/14/2017 Elsevier Patient Education  2020 Elsevier Inc.  

## 2019-02-19 NOTE — Progress Notes (Signed)
Christie Thomas - 63 y.o. female MRN [id number]  Date of birth: 12-25-1955  Subjective Chief Complaint  Patient presents with  . Abdominal Pain    x 1 week is when sxs started   . Dysuria  . cloudy urine    HPI Christie Thomas is a 63 y.o. female here today with complaint of dysuria with frequency, urgency and sensation of incomplete emptying.  She has history of UTI 3-4x/year.  Last UTI in 08/2018 with E Coli with intermediate resistance to Levaquin.  She denies flank/back pain, fever, chills, nausea or vomiting.  ROS:  A comprehensive ROS was completed and negative except as noted per HPI   Allergies  Allergen Reactions  . Penicillins     Throat swelling   . Shellfish Allergy Anaphylaxis  . Metoclopramide Other (See Comments)    Past Medical History:  Diagnosis Date  . Anxiety 01/20/2016  . Asthma   . Diverticulitis 01/20/2016  . Diverticulosis 01/20/2016  . Epilepsy ([place])   . Epilepsy ([place]) 01/20/2016   Adolescent   . Fatty liver 01/20/2016  . GERD (gastroesophageal reflux disease)   . Hay fever   . Hyperlipidemia   . IBS (irritable bowel syndrome) 01/20/2016  . Obesity 01/20/2016  . OSA (obstructive sleep apnea) 09/07/2017  . Osteoarthritis of both hands 01/20/2016  . Swelling of ankle joint, left 01/20/2016  . Trochanteric bursitis 01/20/2016    Past Surgical History:  Procedure Laterality Date  . AUGMENTATION MAMMAPLASTY    . BACK SURGERY     x2  . BREAST BIOPSY     fatty tumor  . BREAST ENHANCEMENT SURGERY    . CARDIAC CATHETERIZATION  07/2010  . CARPAL TUNNEL RELEASE    . CESAREAN SECTION    . CHOLECYSTECTOMY    . HAND SURGERY     carpal tunnel release, cyst excision  . SHOULDER SURGERY     right  . TONSILLECTOMY AND ADENOIDECTOMY      Social History   Socioeconomic History  . Marital status: Married    Spouse name: Not on file  . Number of children: Not on file  . Years of education: Not on file  . Highest education level: Not on file   Occupational History  . Not on file  Social Needs  . Financial resource strain: Not on file  . Food insecurity    Worry: Not on file    Inability: Not on file  . Transportation needs    Medical: Not on file    Non-medical: Not on file  Tobacco Use  . Smoking status: Never Smoker  . Smokeless tobacco: Never Used  Substance and Sexual Activity  . Alcohol use: No  . Drug use: No  . Sexual activity: Not on file  Lifestyle  . Physical activity    Days per week: Not on file    Minutes per session: Not on file  . Stress: Not on file  Relationships  . Social Herbalist on phone: Not on file    Gets together: Not on file    Attends religious service: Not on file    Active member of club or organization: Not on file    Attends meetings of clubs or organizations: Not on file    Relationship status: Not on file  Other Topics Concern  . Not on file  Social History Narrative  . Not on file    Family History  Problem Relation Age of Onset  . Stroke  Mother   . Hypertension Mother   . Hyperlipidemia Mother   . Diabetes Mother   . Cancer Mother   . Sudden death Paternal Grandfather 63  . Alzheimer's disease Father   . Heart attack Neg Hx     Health Maintenance  Topic Date Due  . HIV Screening  09/11/2019 (Originally 02/09/1970)  . MAMMOGRAM  10/22/2019  . DEXA SCAN  10/21/2020  . PAP SMEAR-Modifier  02/06/2022  . COLONOSCOPY  02/22/2023  . TETANUS/TDAP  02/19/2024  . INFLUENZA VACCINE  Completed  . Hepatitis C Screening  Completed    ----------------------------------------------------------------------------------------------------------------------------------------------------------------------------------------------------------------- Physical Exam BP 114/72   Pulse 78   Temp 98.1 F (36.7 C)   Resp 16   Ht 5\' 4"  (1.626 m)   Wt 188 lb 6.4 oz (85.5 kg)   SpO2 97%   BMI 32.34 kg/m   Physical Exam Constitutional:      Appearance: She is  well-developed.  HENT:     Head: Normocephalic and atraumatic.  Cardiovascular:     Rate and Rhythm: Normal rate and regular rhythm.  Pulmonary:     Effort: Pulmonary effort is normal.     Breath sounds: Normal breath sounds.  Abdominal:     General: Abdomen is flat. There is no distension.     Palpations: Abdomen is soft.     Tenderness: There is no abdominal tenderness. There is no right CVA tenderness or left CVA tenderness.  Skin:    General: Skin is warm and dry.     Findings: No rash.  Neurological:     General: No focal deficit present.     Mental Status: She is alert.  Psychiatric:        Mood and Affect: Mood normal.        Behavior: Behavior normal.     ------------------------------------------------------------------------------------------------------------------------------------------------------------------------------------------------------------------- Assessment and Plan  Cystitis UA + for leukocytes, nitrite and RBCs.  Previous culture with E. Coli, will cover with course of macrobid  Push fluids Urine sent for culture and will adjust medication as needed based on results.  Call for any new or worsening symptoms.   This visit occurred during the SARS-CoV-2 public health emergency.  Safety protocols were in place, including screening questions prior to the visit, additional usage of staff PPE, and extensive cleaning of exam room while observing appropriate contact time as indicated for disinfecting solutions.

## 2019-02-19 NOTE — Telephone Encounter (Signed)
Pt left voicemail yesterday to schedule an appt for a bladder infection.  Called pt this morning and left message to call back and schedule

## 2019-02-19 NOTE — Assessment & Plan Note (Signed)
UA + for leukocytes, nitrite and RBCs.  Previous culture with E. Coli, will cover with course of macrobid  Push fluids Urine sent for culture and will adjust medication as needed based on results.  Call for any new or worsening symptoms.

## 2019-02-21 LAB — URINE CULTURE
MICRO NUMBER:: 1150893
SPECIMEN QUALITY:: ADEQUATE

## 2019-02-22 NOTE — Progress Notes (Signed)
Please let patient know that current antibiotic is appropriate for her UTI.  She should complete course.

## 2019-03-18 DIAGNOSIS — N951 Menopausal and female climacteric states: Secondary | ICD-10-CM | POA: Diagnosis not present

## 2019-03-18 DIAGNOSIS — R5383 Other fatigue: Secondary | ICD-10-CM | POA: Diagnosis not present

## 2019-03-18 DIAGNOSIS — G479 Sleep disorder, unspecified: Secondary | ICD-10-CM | POA: Diagnosis not present

## 2019-03-20 ENCOUNTER — Encounter: Payer: Self-pay | Admitting: Physician Assistant

## 2019-03-20 ENCOUNTER — Ambulatory Visit (INDEPENDENT_AMBULATORY_CARE_PROVIDER_SITE_OTHER): Payer: BC Managed Care – PPO | Admitting: Physician Assistant

## 2019-03-20 ENCOUNTER — Other Ambulatory Visit: Payer: Self-pay

## 2019-03-20 DIAGNOSIS — R300 Dysuria: Secondary | ICD-10-CM

## 2019-03-20 DIAGNOSIS — R5383 Other fatigue: Secondary | ICD-10-CM | POA: Diagnosis not present

## 2019-03-20 DIAGNOSIS — N951 Menopausal and female climacteric states: Secondary | ICD-10-CM | POA: Diagnosis not present

## 2019-03-20 DIAGNOSIS — G479 Sleep disorder, unspecified: Secondary | ICD-10-CM | POA: Diagnosis not present

## 2019-03-20 MED ORDER — NITROFURANTOIN MONOHYD MACRO 100 MG PO CAPS: 100.0000 mg | ORAL_CAPSULE | Freq: Two times a day (BID) | ORAL | 0 refills | Status: DC

## 2019-03-20 NOTE — Progress Notes (Signed)
I have discussed the procedure for the virtual visit with the patient who has given consent to proceed with assessment and treatment.   Christie Thomas, CMA     

## 2019-03-20 NOTE — Patient Instructions (Signed)
Your symptoms are consistent with a bladder infection, also called acute cystitis. Please take your antibiotic (Macrobid) as directed until all pills are gone.  Stay very well hydrated.  Consider a daily probiotic (Align, Culturelle, or Activia) to help prevent stomach upset caused by the antibiotic.  Taking a probiotic daily may also help prevent recurrent UTIs.  Also consider taking AZO (Phenazopyridine) tablets to help decrease pain with urination.  I will call you with your urine testing results.  We will change antibiotics if indicated.  Call or return to clinic if symptoms are not resolved by completion of antibiotic.   Urinary Tract Infection A urinary tract infection (UTI) can occur any place along the urinary tract. The tract includes the kidneys, ureters, bladder, and urethra. A type of germ called bacteria often causes a UTI. UTIs are often helped with antibiotic medicine.  HOME CARE   If given, take antibiotics as told by your doctor. Finish them even if you start to feel better.  Drink enough fluids to keep your pee (urine) clear or pale yellow.  Avoid tea, drinks with caffeine, and bubbly (carbonated) drinks.  Pee often. Avoid holding your pee in for a long time.  Pee before and after having sex (intercourse).  Wipe from front to back after you poop (bowel movement) if you are a woman. Use each tissue only once. GET HELP RIGHT AWAY IF:   You have back pain.  You have lower belly (abdominal) pain.  You have chills.  You feel sick to your stomach (nauseous).  You throw up (vomit).  Your burning or discomfort with peeing does not go away.  You have a fever.  Your symptoms are not better in 3 days. MAKE SURE YOU:   Understand these instructions.  Will watch your condition.  Will get help right away if you are not doing well or get worse. Document Released: 08/24/2007 Document Revised: 11/30/2011 Document Reviewed: 10/06/2011 ExitCare Patient Information 2015  [employer]. This information is not intended to replace advice given to you by your health care provider. Make sure you discuss any questions you have with your health care provider.   

## 2019-03-20 NOTE — Progress Notes (Signed)
Virtual Visit via Video   I connected with patient on 03/20/19 at  3:00 PM EST by a video enabled telemedicine application and verified that I am speaking with the correct person using two identifiers.  Location patient: Home Location provider: [facility] Persons participating in the virtual visit: Patient, Provider, [place] (Patina Moore)  I discussed the limitations of evaluation and management by telemedicine and the availability of in person appointments. The patient expressed understanding and agreed to proceed.  Subjective:   HPI:   Patient presents via doxy doxy.me today complaining of 5 days of dysuria, incomplete bladder emptying, urinary frequency and cloudy urine.  Patient endorses intermittent urinary urgency.  Patient denies fever, chills, flank pain, low back pain. Denies blood in the urine. Some irritation around the urethral meatus. Denies vaginal discharge. Has been using a bidet due to history of frequency or urinary tract infections.  Has been keeping well hydrated.  Of note was seen on 02/19/2019 by Dr. Zigmund Daniel via video visit.  Was diagnosed with dysuria and started on empiric treatment for UTI with Macrobid.  Culture was obtained at that time revealing E. coli that was sensitive to antibiotic given.  Patient endorses cleaning antibiotic with complete resolution of symptoms..  ROS:   See pertinent positives and negatives per HPI.  Patient Active Problem List   Diagnosis Date Noted  . Rheumatoid arthritis of multiple sites with negative rheumatoid factor ([place]) 11/17/2016  . High risk medication use 11/17/2016  . Elevated LFTs/ Dr Collene Mares follows  11/17/2016  . Anosmia 08/02/2016  . High risk medications (not anticoagulants) long-term use 06/15/2016  . ANA positive 01/21/2016  . Swelling of ankle joint, left 01/20/2016  . Osteoarthritis of both hands 01/20/2016  . GERD (gastroesophageal reflux disease) 01/20/2016  . Diverticulosis 01/20/2016  . Fatty  liver 01/20/2016  . IBS (irritable bowel syndrome) 01/20/2016  . Obesity 01/20/2016  . History of epilepsy 01/20/2016  . Cystitis 11/01/2014  . Routine general medical examination at a health care facility 05/29/2012  . Anxiety and depression 01/12/2011  . Hyperlipidemia 08/10/2010  . Gastroparesis 08/10/2010  . Asthma 08/10/2010  . Allergic rhinitis 08/10/2010    Social History   Tobacco Use  . Smoking status: Never Smoker  . Smokeless tobacco: Never Used  Substance Use Topics  . Alcohol use: No    Current Outpatient Medications:  .  albuterol (PROVENTIL HFA;VENTOLIN HFA) 108 (90 Base) MCG/ACT inhaler, Inhale 2 puffs into the lungs every 4 (four) hours as needed., Disp: 1 Inhaler, Rfl: 6 .  AMBULATORY NON FORMULARY MEDICATION, Ultra flora IB -- once daily , Disp: , Rfl:  .  Cholecalciferol (VITAMIN D PO), Take by mouth., Disp: , Rfl:  .  clonazePAM (KLONOPIN) 0.5 MG tablet, TAKE 1 TABLET BY MOUTH THREE TIMES A DAY IF NEEDED FOR ANXIETY, Disp: 60 tablet, Rfl: 1 .  fenofibrate 160 MG tablet, Take 1 tablet (160 mg total) by mouth daily. with food, Disp: 30 tablet, Rfl: 3 .  fluticasone (FLONASE) 50 MCG/ACT nasal spray, instill 1 to 2 sprays into each nostril daily (Patient taking differently: Use as Directed as Needed), Disp: 16 g, Rfl: 3 .  progesterone (PROMETRIUM) 100 MG capsule, TK 1 C PO HS, Disp: , Rfl: 0 .  QVAR 40 MCG/ACT inhaler, inhale 2 puffs INTO THE LUNGS BY MOUTH twice a day, Disp: 8.7 g, Rfl: 6 .  rosuvastatin (CRESTOR) 10 MG tablet, TAKE 1 TABLET(10 MG) BY MOUTH DAILY, Disp: 30 tablet, Rfl: 3 .  Testosterone 100 MG PLLT, by Implant route., Disp: , Rfl:  .  TURMERIC CURCUMIN PO, Take 500 mg by mouth 2 (two) times daily., Disp: , Rfl:   Allergies  Allergen Reactions  . Penicillins     Throat swelling   . Shellfish Allergy Anaphylaxis  . Metoclopramide Other (See Comments)    Objective:   There were no vitals taken for this visit.  Patient is well-developed,  well-nourished in no acute distress.  Resting comfortably at home.  Head is normocephalic, atraumatic.  No labored breathing.  Speech is clear and coherent with logical content.  Patient is alert and oriented at baseline.   Assessment and Plan:   1. Dysuria Symptoms concerning for UTI.  Had one about a month ago which seems to have been adequately treated with Macrobid.  Will have patient come in to obtain urine testing.  Encouraged her to keep well-hydrated and continue daily probiotic.  Consider starting Azo.  Continue cranberry supplement.  Will start empiric treatment for UTI will waiting for culture to result.  Based on last culture and sensitivities along with history of anaphylactic reaction to penicillins, will need to restart Macrobid while cultures are pending.  Rx sent to pharmacy. - Urinalysis, Routine w reflex microscopic; Future - Urine Culture; Future    Leeanne Rio, PA-C 03/20/2019

## 2019-03-21 ENCOUNTER — Other Ambulatory Visit (INDEPENDENT_AMBULATORY_CARE_PROVIDER_SITE_OTHER): Payer: BC Managed Care – PPO

## 2019-03-21 DIAGNOSIS — R300 Dysuria: Secondary | ICD-10-CM | POA: Diagnosis not present

## 2019-03-21 LAB — URINALYSIS, ROUTINE W REFLEX MICROSCOPIC
Bilirubin Urine: NEGATIVE
Hgb urine dipstick: NEGATIVE
Ketones, ur: NEGATIVE
Nitrite: NEGATIVE
Specific Gravity, Urine: 1.025 (ref 1.000–1.030)
Total Protein, Urine: NEGATIVE
Urine Glucose: NEGATIVE
Urobilinogen, UA: 0.2 (ref 0.0–1.0)
pH: 6 (ref 5.0–8.0)

## 2019-03-23 LAB — URINE CULTURE
MICRO NUMBER:: 1245343
Result:: NO GROWTH
SPECIMEN QUALITY:: ADEQUATE

## 2019-03-27 DIAGNOSIS — L820 Inflamed seborrheic keratosis: Secondary | ICD-10-CM | POA: Diagnosis not present

## 2019-04-03 ENCOUNTER — Other Ambulatory Visit: Payer: Self-pay | Admitting: General Practice

## 2019-04-03 MED ORDER — ROSUVASTATIN CALCIUM 10 MG PO TABS: ORAL_TABLET | ORAL | 0 refills | Status: DC

## 2019-04-03 MED ORDER — FENOFIBRATE 160 MG PO TABS: 160.0000 mg | ORAL_TABLET | Freq: Every day | ORAL | 1 refills | Status: DC

## 2019-04-08 ENCOUNTER — Other Ambulatory Visit: Payer: Self-pay | Admitting: General Practice

## 2019-04-08 MED ORDER — CLONAZEPAM 0.5 MG PO TABS: ORAL_TABLET | ORAL | 1 refills | Status: DC

## 2019-04-08 NOTE — Telephone Encounter (Signed)
Last OV 02/07/19 Clonazepam last filled 05/30/18 #60 with 1

## 2019-04-10 ENCOUNTER — Ambulatory Visit: Payer: BC Managed Care – PPO | Attending: Family Medicine

## 2019-04-10 DIAGNOSIS — Z23 Encounter for immunization: Secondary | ICD-10-CM | POA: Insufficient documentation

## 2019-04-10 NOTE — Progress Notes (Signed)
   Covid-19 Vaccination Clinic  Name:  Christie Thomas    MRN: [id number] DOB: 23-May-1955  04/10/2019  [name] was observed post Covid-19 immunization for 15 minutes without incidence. She was provided with Vaccine Information Sheet and instruction to access the V-Safe system.   [name] was instructed to call 911 with any severe reactions post vaccine: Marland Kitchen Difficulty breathing  . Swelling of your face and throat  . A fast heartbeat  . A bad rash all over your body  . Dizziness and weakness    Immunizations Administered    Name Date Dose VIS Date Route   Pfizer COVID-19 Vaccine 04/10/2019 11:38 AM 0.3 mL 03/01/2019 Intramuscular   Manufacturer: [place]   Lot: [id number]   [place]: [id number]

## 2019-04-29 ENCOUNTER — Ambulatory Visit: Payer: BC Managed Care – PPO | Attending: Internal Medicine

## 2019-04-29 DIAGNOSIS — Z23 Encounter for immunization: Secondary | ICD-10-CM | POA: Insufficient documentation

## 2019-04-29 NOTE — Progress Notes (Signed)
   Covid-19 Vaccination Clinic  Name:  Christie Thomas    MRN: [id number] DOB: July 08, 1955  04/29/2019  [name] was observed post Covid-19 immunization for 15 minutes without incidence. She was provided with Vaccine Information Sheet and instruction to access the V-Safe system.   [name] was instructed to call 911 with any severe reactions post vaccine: Marland Kitchen Difficulty breathing  . Swelling of your face and throat  . A fast heartbeat  . A bad rash all over your body  . Dizziness and weakness    Immunizations Administered    Name Date Dose VIS Date Route   Pfizer COVID-19 Vaccine 04/29/2019  2:11 PM 0.3 mL 03/01/2019 Intramuscular   Manufacturer: [place]   Lot: [id number]   [place]: [id number]

## 2019-06-17 DIAGNOSIS — G479 Sleep disorder, unspecified: Secondary | ICD-10-CM | POA: Diagnosis not present

## 2019-06-17 DIAGNOSIS — R5383 Other fatigue: Secondary | ICD-10-CM | POA: Diagnosis not present

## 2019-06-17 DIAGNOSIS — N951 Menopausal and female climacteric states: Secondary | ICD-10-CM | POA: Diagnosis not present

## 2019-06-19 DIAGNOSIS — R5383 Other fatigue: Secondary | ICD-10-CM | POA: Diagnosis not present

## 2019-06-19 DIAGNOSIS — N951 Menopausal and female climacteric states: Secondary | ICD-10-CM | POA: Diagnosis not present

## 2019-06-19 DIAGNOSIS — G479 Sleep disorder, unspecified: Secondary | ICD-10-CM | POA: Diagnosis not present

## 2019-07-01 ENCOUNTER — Other Ambulatory Visit: Payer: Self-pay | Admitting: General Practice

## 2019-07-01 MED ORDER — ROSUVASTATIN CALCIUM 10 MG PO TABS: ORAL_TABLET | ORAL | 0 refills | Status: DC

## 2019-08-06 ENCOUNTER — Ambulatory Visit (INDEPENDENT_AMBULATORY_CARE_PROVIDER_SITE_OTHER): Payer: BC Managed Care – PPO | Admitting: Family Medicine

## 2019-08-06 ENCOUNTER — Other Ambulatory Visit: Payer: Self-pay

## 2019-08-06 ENCOUNTER — Encounter: Payer: Self-pay | Admitting: Family Medicine

## 2019-08-06 VITALS — BP 112/70 | HR 78 | Temp 97.9°F | Resp 16 | Ht 64.0 in | Wt 192.0 lb

## 2019-08-06 DIAGNOSIS — Z6832 Body mass index (BMI) 32.0-32.9, adult: Secondary | ICD-10-CM | POA: Diagnosis not present

## 2019-08-06 DIAGNOSIS — E6609 Other obesity due to excess calories: Secondary | ICD-10-CM | POA: Diagnosis not present

## 2019-08-06 DIAGNOSIS — M0609 Rheumatoid arthritis without rheumatoid factor, multiple sites: Secondary | ICD-10-CM

## 2019-08-06 DIAGNOSIS — E785 Hyperlipidemia, unspecified: Secondary | ICD-10-CM | POA: Diagnosis not present

## 2019-08-06 LAB — HEPATIC FUNCTION PANEL
ALT: 49 U/L — ABNORMAL HIGH (ref 0–35)
AST: 51 U/L — ABNORMAL HIGH (ref 0–37)
Albumin: 4.5 g/dL (ref 3.5–5.2)
Alkaline Phosphatase: 67 U/L (ref 39–117)
Bilirubin, Direct: 0.2 mg/dL (ref 0.0–0.3)
Total Bilirubin: 0.8 mg/dL (ref 0.2–1.2)
Total Protein: 7.4 g/dL (ref 6.0–8.3)

## 2019-08-06 LAB — CBC WITH DIFFERENTIAL/PLATELET
Basophils Absolute: 0.1 10*3/uL (ref 0.0–0.1)
Basophils Relative: 0.8 % (ref 0.0–3.0)
Eosinophils Absolute: 0.3 10*3/uL (ref 0.0–0.7)
Eosinophils Relative: 4.7 % (ref 0.0–5.0)
HCT: 46.1 % — ABNORMAL HIGH (ref 36.0–46.0)
Hemoglobin: 15.6 g/dL — ABNORMAL HIGH (ref 12.0–15.0)
Lymphocytes Relative: 37.6 % (ref 12.0–46.0)
Lymphs Abs: 2.6 10*3/uL (ref 0.7–4.0)
MCHC: 33.9 g/dL (ref 30.0–36.0)
MCV: 90.3 fl (ref 78.0–100.0)
Monocytes Absolute: 0.6 10*3/uL (ref 0.1–1.0)
Monocytes Relative: 8.8 % (ref 3.0–12.0)
Neutro Abs: 3.3 10*3/uL (ref 1.4–7.7)
Neutrophils Relative %: 48.1 % (ref 43.0–77.0)
Platelets: 243 10*3/uL (ref 150.0–400.0)
RBC: 5.11 Mil/uL (ref 3.87–5.11)
RDW: 12.6 % (ref 11.5–15.5)
WBC: 6.9 10*3/uL (ref 4.0–10.5)

## 2019-08-06 LAB — BASIC METABOLIC PANEL
BUN: 20 mg/dL (ref 6–23)
CO2: 25 mEq/L (ref 19–32)
Calcium: 9.5 mg/dL (ref 8.4–10.5)
Chloride: 107 mEq/L (ref 96–112)
Creatinine, Ser: 0.97 mg/dL (ref 0.40–1.20)
GFR: 57.73 mL/min — ABNORMAL LOW (ref >60.00)
Glucose, Bld: 95 mg/dL (ref 70–99)
Potassium: 4.3 mEq/L (ref 3.5–5.1)
Sodium: 137 mEq/L (ref 135–145)

## 2019-08-06 LAB — LIPID PANEL
Cholesterol: 137 mg/dL (ref 0–200)
HDL: 51.5 mg/dL (ref >39.00)
LDL Cholesterol: 68 mg/dL (ref 0–99)
NonHDL: 85.88
Total CHOL/HDL Ratio: 3
Triglycerides: 90 mg/dL (ref 0.0–149.0)
VLDL: 18 mg/dL (ref 0.0–40.0)

## 2019-08-06 NOTE — Progress Notes (Signed)
   Subjective:    Patient ID: Christie Thomas, female    DOB: 04-Sep-1955, 64 y.o.   MRN: [id number]  HPI Hyperlipidemia- chronic problem, on Crestor 10mg  daily, Fenofibrate 160mg  daily.  Denies CP, SOB, abd pain, N/V  Obesity- pt is up 4 lbs since last visit.  BMI now 32.96  Walking regularly, doing squats/sit ups/leg routine  RA- chronic problem, sees Dr Estanislado Pandy but has not been recently.  Currently off all prescription medication.  Pt reports feeling good   Review of Systems For ROS see HPI   This visit occurred during the SARS-CoV-2 public health emergency.  Safety protocols were in place, including screening questions prior to the visit, additional usage of staff PPE, and extensive cleaning of exam room while observing appropriate contact time as indicated for disinfecting solutions.       Objective:   Physical Exam Vitals reviewed.  Constitutional:      General: She is not in acute distress.    Appearance: She is well-developed. She is obese.  HENT:     Head: Normocephalic and atraumatic.  Eyes:     Conjunctiva/sclera: Conjunctivae normal.     Pupils: Pupils are equal, round, and reactive to light.  Neck:     Thyroid: No thyromegaly.  Cardiovascular:     Rate and Rhythm: Normal rate and regular rhythm.     Heart sounds: Normal heart sounds. No murmur.  Pulmonary:     Effort: Pulmonary effort is normal. No respiratory distress.     Breath sounds: Normal breath sounds.  Abdominal:     General: There is no distension.     Palpations: Abdomen is soft.     Tenderness: There is no abdominal tenderness.  Musculoskeletal:     Cervical back: Normal range of motion and neck supple.  Lymphadenopathy:     Cervical: No cervical adenopathy.  Skin:    General: Skin is warm and dry.  Neurological:     Mental Status: She is alert and oriented to person, place, and time.  Psychiatric:        Behavior: Behavior normal.           Assessment & Plan:

## 2019-08-06 NOTE — Assessment & Plan Note (Signed)
Deteriorated.  Pt has gained 4 lbs since last visit but she is again exercising regularly.  Applauded her efforts.  Will follow.

## 2019-08-06 NOTE — Patient Instructions (Signed)
Schedule your complete physical in 6 months We'll notify you of your lab results and make any changes if needed Continue to work on healthy diet and regular exercise- you're doing great! Call with any questions or concerns Have a great summer! Hang in there!

## 2019-08-06 NOTE — Assessment & Plan Note (Signed)
Chronic problem, on Crestor 10mg  daily and Fenofibrate 160mg  daily.  Currently asymptomatic.  Check labs.  Adjust meds prn

## 2019-08-06 NOTE — Assessment & Plan Note (Signed)
Currently off all medication and has not seen Rheum in over a year.  Feeling well and denies joint pains at this time.  Will follow.

## 2019-08-07 ENCOUNTER — Encounter: Payer: Self-pay | Admitting: General Practice

## 2019-08-07 LAB — TSH: TSH: 1.41 u[IU]/mL (ref 0.35–4.50)

## 2019-09-03 DIAGNOSIS — N951 Menopausal and female climacteric states: Secondary | ICD-10-CM | POA: Diagnosis not present

## 2019-09-03 DIAGNOSIS — R5383 Other fatigue: Secondary | ICD-10-CM | POA: Diagnosis not present

## 2019-09-03 DIAGNOSIS — G479 Sleep disorder, unspecified: Secondary | ICD-10-CM | POA: Diagnosis not present

## 2019-09-06 DIAGNOSIS — R6882 Decreased libido: Secondary | ICD-10-CM | POA: Diagnosis not present

## 2019-09-06 DIAGNOSIS — N951 Menopausal and female climacteric states: Secondary | ICD-10-CM | POA: Diagnosis not present

## 2019-09-06 DIAGNOSIS — G479 Sleep disorder, unspecified: Secondary | ICD-10-CM | POA: Diagnosis not present

## 2019-09-27 ENCOUNTER — Other Ambulatory Visit: Payer: Self-pay | Admitting: Emergency Medicine

## 2019-09-27 DIAGNOSIS — E785 Hyperlipidemia, unspecified: Secondary | ICD-10-CM

## 2019-09-27 MED ORDER — FENOFIBRATE 160 MG PO TABS: 160.0000 mg | ORAL_TABLET | Freq: Every day | ORAL | 1 refills | Status: DC

## 2019-09-27 MED ORDER — ROSUVASTATIN CALCIUM 10 MG PO TABS: ORAL_TABLET | ORAL | 1 refills | Status: DC

## 2019-10-31 ENCOUNTER — Encounter: Payer: Self-pay | Admitting: Family Medicine

## 2019-12-02 ENCOUNTER — Other Ambulatory Visit (HOSPITAL_BASED_OUTPATIENT_CLINIC_OR_DEPARTMENT_OTHER): Payer: Self-pay | Admitting: Family Medicine

## 2019-12-02 DIAGNOSIS — G479 Sleep disorder, unspecified: Secondary | ICD-10-CM | POA: Diagnosis not present

## 2019-12-02 DIAGNOSIS — N951 Menopausal and female climacteric states: Secondary | ICD-10-CM | POA: Diagnosis not present

## 2019-12-02 DIAGNOSIS — R5383 Other fatigue: Secondary | ICD-10-CM | POA: Diagnosis not present

## 2019-12-02 DIAGNOSIS — Z1231 Encounter for screening mammogram for malignant neoplasm of breast: Secondary | ICD-10-CM

## 2019-12-03 ENCOUNTER — Other Ambulatory Visit: Payer: Self-pay | Admitting: General Practice

## 2019-12-03 NOTE — Telephone Encounter (Signed)
Last OV 08/06/19 Clonazepam last filled 04/08/19 #60 with 1

## 2019-12-04 ENCOUNTER — Other Ambulatory Visit: Payer: Self-pay

## 2019-12-04 ENCOUNTER — Ambulatory Visit (HOSPITAL_BASED_OUTPATIENT_CLINIC_OR_DEPARTMENT_OTHER)
Admission: RE | Admit: 2019-12-04 | Discharge: 2019-12-04 | Disposition: A | Discharge status: Home / Self Care | Payer: BC Managed Care – PPO | Source: Ambulatory Visit | Attending: Family Medicine | Admitting: Family Medicine

## 2019-12-04 DIAGNOSIS — Z1231 Encounter for screening mammogram for malignant neoplasm of breast: Secondary | ICD-10-CM | POA: Diagnosis not present

## 2019-12-04 DIAGNOSIS — R6882 Decreased libido: Secondary | ICD-10-CM | POA: Diagnosis not present

## 2019-12-04 DIAGNOSIS — N951 Menopausal and female climacteric states: Secondary | ICD-10-CM | POA: Diagnosis not present

## 2019-12-04 DIAGNOSIS — Z6832 Body mass index (BMI) 32.0-32.9, adult: Secondary | ICD-10-CM | POA: Diagnosis not present

## 2019-12-04 DIAGNOSIS — R232 Flushing: Secondary | ICD-10-CM | POA: Diagnosis not present

## 2019-12-04 MED ORDER — CLONAZEPAM 0.5 MG PO TABS: ORAL_TABLET | ORAL | 1 refills | Status: DC

## 2020-02-11 ENCOUNTER — Other Ambulatory Visit: Payer: Self-pay

## 2020-02-11 ENCOUNTER — Ambulatory Visit (INDEPENDENT_AMBULATORY_CARE_PROVIDER_SITE_OTHER): Payer: BC Managed Care – PPO | Admitting: Family Medicine

## 2020-02-11 ENCOUNTER — Encounter: Payer: Self-pay | Admitting: Family Medicine

## 2020-02-11 VITALS — BP 132/80 | HR 72 | Temp 97.0°F | Resp 17 | Ht 64.0 in | Wt 193.0 lb

## 2020-02-11 DIAGNOSIS — Z6833 Body mass index (BMI) 33.0-33.9, adult: Secondary | ICD-10-CM | POA: Diagnosis not present

## 2020-02-11 DIAGNOSIS — Z0000 Encounter for general adult medical examination without abnormal findings: Secondary | ICD-10-CM

## 2020-02-11 DIAGNOSIS — E6609 Other obesity due to excess calories: Secondary | ICD-10-CM

## 2020-02-11 DIAGNOSIS — Z114 Encounter for screening for human immunodeficiency virus [HIV]: Secondary | ICD-10-CM

## 2020-02-11 LAB — LIPID PANEL
Cholesterol: 119 mg/dL (ref 0–200)
HDL: 52.8 mg/dL (ref >39.00)
LDL Cholesterol: 47 mg/dL (ref 0–99)
NonHDL: 66.46
Total CHOL/HDL Ratio: 2
Triglycerides: 97 mg/dL (ref 0.0–149.0)
VLDL: 19.4 mg/dL (ref 0.0–40.0)

## 2020-02-11 LAB — CBC WITH DIFFERENTIAL/PLATELET
Basophils Absolute: 0 10*3/uL (ref 0.0–0.1)
Basophils Relative: 0.4 % (ref 0.0–3.0)
Eosinophils Absolute: 0.4 10*3/uL (ref 0.0–0.7)
Eosinophils Relative: 5.8 % — ABNORMAL HIGH (ref 0.0–5.0)
HCT: 46.9 % — ABNORMAL HIGH (ref 36.0–46.0)
Hemoglobin: 15.8 g/dL — ABNORMAL HIGH (ref 12.0–15.0)
Lymphocytes Relative: 35.5 % (ref 12.0–46.0)
Lymphs Abs: 2.3 10*3/uL (ref 0.7–4.0)
MCHC: 33.7 g/dL (ref 30.0–36.0)
MCV: 90.4 fl (ref 78.0–100.0)
Monocytes Absolute: 0.7 10*3/uL (ref 0.1–1.0)
Monocytes Relative: 10.6 % (ref 3.0–12.0)
Neutro Abs: 3.1 10*3/uL (ref 1.4–7.7)
Neutrophils Relative %: 47.7 % (ref 43.0–77.0)
Platelets: 265 10*3/uL (ref 150.0–400.0)
RBC: 5.19 Mil/uL — ABNORMAL HIGH (ref 3.87–5.11)
RDW: 12.9 % (ref 11.5–15.5)
WBC: 6.5 10*3/uL (ref 4.0–10.5)

## 2020-02-11 LAB — BASIC METABOLIC PANEL
BUN: 20 mg/dL (ref 6–23)
CO2: 25 mEq/L (ref 19–32)
Calcium: 9.6 mg/dL (ref 8.4–10.5)
Chloride: 105 mEq/L (ref 96–112)
Creatinine, Ser: 1.01 mg/dL (ref 0.40–1.20)
GFR: 58.63 mL/min — ABNORMAL LOW (ref >60.00)
Glucose, Bld: 93 mg/dL (ref 70–99)
Potassium: 4.1 mEq/L (ref 3.5–5.1)
Sodium: 138 mEq/L (ref 135–145)

## 2020-02-11 LAB — HEPATIC FUNCTION PANEL
ALT: 75 U/L — ABNORMAL HIGH (ref 0–35)
AST: 74 U/L — ABNORMAL HIGH (ref 0–37)
Albumin: 4.4 g/dL (ref 3.5–5.2)
Alkaline Phosphatase: 64 U/L (ref 39–117)
Bilirubin, Direct: 0.2 mg/dL (ref 0.0–0.3)
Total Bilirubin: 0.9 mg/dL (ref 0.2–1.2)
Total Protein: 7.5 g/dL (ref 6.0–8.3)

## 2020-02-11 LAB — TSH: TSH: 1.64 u[IU]/mL (ref 0.35–4.50)

## 2020-02-11 NOTE — Patient Instructions (Addendum)
Follow up in 6 months to recheck cholesterol We'll notify you of your lab results and make any changes if needed Continue to work on healthy diet and regular exercise- you can do it!! Call with any questions or concerns Stay Safe!  Stay Healthy! Happy [place] Birthday! Happy Thanksgiving!!!

## 2020-02-11 NOTE — Assessment & Plan Note (Signed)
Ongoing issue for pt.  Stressed need for healthy diet and regular exercise.  Check labs to risk stratify.

## 2020-02-11 NOTE — Assessment & Plan Note (Signed)
Pt's PE WNL w/ exception of obesity.  UTD on mammo, colonoscopy, immunizations.  Will hold on Pneumovax as pt is scheduled for COVID booster tomorrow.  Check labs.  Anticipatory guidance provided.

## 2020-02-11 NOTE — Progress Notes (Signed)
   Subjective:    Patient ID: Christie Thomas, female    DOB: 11-Apr-1955, 64 y.o.   MRN: [id number]  HPI CPE- UTD on colonoscopy, mammo, Tdap, flu, COVID, DEXA.  No need for pap.  Reviewed past medical, surgical, family and social histories.   Patient Care Team    Relationship Specialty Notifications Start End  Midge Minium, MD PCP - General Family Medicine  03/02/11   [place], Heather, [place] Physician Optometry  03/04/15   Verda Cumins, MD Consulting Physician Rehabilitation  04/13/15   Juanita Craver, MD Consulting Physician Gastroenterology  09/03/15   Bo Merino, MD Consulting Physician Rheumatology  10/28/15     Health Maintenance  Topic Date Due  . HIV Screening  Never done  . PNA vac Low Risk Adult (2 of 2 - PPSV23) 02/10/2020  . DEXA SCAN  10/21/2020  . MAMMOGRAM  12/03/2020  . PAP SMEAR-Modifier  02/06/2022  . COLONOSCOPY  02/22/2023  . TETANUS/TDAP  02/19/2024  . INFLUENZA VACCINE  Completed  . COVID-19 Vaccine  Completed  . Hepatitis C Screening  Completed      Review of Systems Patient reports no vision/ hearing changes, adenopathy,fever, weight change,  persistant/recurrent hoarseness , swallowing issues, chest pain, palpitations, edema, persistant/recurrent cough, hemoptysis, dyspnea (rest/exertional/paroxysmal nocturnal), gastrointestinal bleeding (melena, rectal bleeding), abdominal pain, significant heartburn, bowel changes, GU symptoms (dysuria, hematuria, incontinence), Gyn symptoms (abnormal  bleeding, pain),  syncope, focal weakness, memory loss, numbness & tingling, skin/hair/nail changes, abnormal bruising or bleeding, anxiety, or depression.   This visit occurred during the SARS-CoV-2 public health emergency.  Safety protocols were in place, including screening questions prior to the visit, additional usage of staff PPE, and extensive cleaning of exam room while observing appropriate contact time as indicated for disinfecting solutions.        Objective:   Physical Exam General Appearance:    Alert, cooperative, no distress, appears stated age, obese  Head:    Normocephalic, without obvious abnormality, atraumatic  Eyes:    PERRL, conjunctiva/corneas clear, EOM's intact, fundi    benign, both eyes  Ears:    Normal TM's and external ear canals, both ears  Nose:   Deferred due to COVID  Throat:   Neck:   Supple, symmetrical, trachea midline, no adenopathy;    Thyroid: no enlargement/tenderness/nodules  Back:     Symmetric, no curvature, ROM normal, no CVA tenderness  Lungs:     Clear to auscultation bilaterally, respirations unlabored  Chest Wall:    No tenderness or deformity   Heart:    Regular rate and rhythm, S1 and S2 normal, no murmur, rub   or gallop  Breast Exam:    Deferred to mammo  Abdomen:     Soft, non-tender, bowel sounds active all four quadrants,    no masses, no organomegaly  Genitalia:    Deferred  Rectal:    Extremities:   Extremities normal, atraumatic, no cyanosis or edema  Pulses:   2+ and symmetric all extremities  Skin:   Skin color, texture, turgor normal, no rashes or lesions  Lymph nodes:   Cervical, supraclavicular, and axillary nodes normal  Neurologic:   CNII-XII intact, normal strength, sensation and reflexes    throughout          Assessment & Plan:

## 2020-02-12 LAB — HIV ANTIBODY (ROUTINE TESTING W REFLEX): HIV 1&2 Ab, 4th Generation: NONREACTIVE

## 2020-02-17 DIAGNOSIS — H2513 Age-related nuclear cataract, bilateral: Secondary | ICD-10-CM | POA: Diagnosis not present

## 2020-03-04 DIAGNOSIS — N951 Menopausal and female climacteric states: Secondary | ICD-10-CM | POA: Diagnosis not present

## 2020-03-06 DIAGNOSIS — E7800 Pure hypercholesterolemia, unspecified: Secondary | ICD-10-CM | POA: Diagnosis not present

## 2020-03-06 DIAGNOSIS — N898 Other specified noninflammatory disorders of vagina: Secondary | ICD-10-CM | POA: Diagnosis not present

## 2020-03-06 DIAGNOSIS — N951 Menopausal and female climacteric states: Secondary | ICD-10-CM | POA: Diagnosis not present

## 2020-04-16 ENCOUNTER — Other Ambulatory Visit: Payer: BC Managed Care – PPO

## 2020-04-16 ENCOUNTER — Encounter: Payer: Self-pay | Admitting: Family Medicine

## 2020-04-16 DIAGNOSIS — Z20822 Contact with and (suspected) exposure to covid-19: Secondary | ICD-10-CM | POA: Diagnosis not present

## 2020-04-17 LAB — NOVEL CORONAVIRUS, NAA: SARS-CoV-2, NAA: NOT DETECTED

## 2020-04-17 LAB — SARS-COV-2, NAA 2 DAY TAT

## 2020-06-02 DIAGNOSIS — N951 Menopausal and female climacteric states: Secondary | ICD-10-CM | POA: Diagnosis not present

## 2020-06-04 DIAGNOSIS — R4586 Emotional lability: Secondary | ICD-10-CM | POA: Diagnosis not present

## 2020-06-04 DIAGNOSIS — R232 Flushing: Secondary | ICD-10-CM | POA: Diagnosis not present

## 2020-06-04 DIAGNOSIS — N951 Menopausal and female climacteric states: Secondary | ICD-10-CM | POA: Diagnosis not present

## 2020-07-14 ENCOUNTER — Encounter: Payer: Self-pay | Admitting: Family Medicine

## 2020-08-11 ENCOUNTER — Encounter: Payer: Self-pay | Admitting: Family Medicine

## 2020-08-11 ENCOUNTER — Other Ambulatory Visit: Payer: Self-pay

## 2020-08-11 ENCOUNTER — Ambulatory Visit (INDEPENDENT_AMBULATORY_CARE_PROVIDER_SITE_OTHER): Payer: BC Managed Care – PPO | Admitting: Family Medicine

## 2020-08-11 VITALS — BP 118/80 | HR 61 | Temp 97.7°F | Resp 17 | Ht 64.0 in | Wt 195.2 lb

## 2020-08-11 DIAGNOSIS — Z636 Dependent relative needing care at home: Secondary | ICD-10-CM | POA: Diagnosis not present

## 2020-08-11 DIAGNOSIS — Z6833 Body mass index (BMI) 33.0-33.9, adult: Secondary | ICD-10-CM | POA: Diagnosis not present

## 2020-08-11 DIAGNOSIS — E6609 Other obesity due to excess calories: Secondary | ICD-10-CM

## 2020-08-11 DIAGNOSIS — E785 Hyperlipidemia, unspecified: Secondary | ICD-10-CM

## 2020-08-11 DIAGNOSIS — Z23 Encounter for immunization: Secondary | ICD-10-CM

## 2020-08-11 LAB — LIPID PANEL
Cholesterol: 131 mg/dL (ref 0–200)
HDL: 49.7 mg/dL (ref >39.00)
LDL Cholesterol: 59 mg/dL (ref 0–99)
NonHDL: 81.68
Total CHOL/HDL Ratio: 3
Triglycerides: 114 mg/dL (ref 0.0–149.0)
VLDL: 22.8 mg/dL (ref 0.0–40.0)

## 2020-08-11 LAB — HEPATIC FUNCTION PANEL
ALT: 63 U/L — ABNORMAL HIGH (ref 0–35)
AST: 63 U/L — ABNORMAL HIGH (ref 0–37)
Albumin: 4.5 g/dL (ref 3.5–5.2)
Alkaline Phosphatase: 64 U/L (ref 39–117)
Bilirubin, Direct: 0.2 mg/dL (ref 0.0–0.3)
Total Bilirubin: 0.9 mg/dL (ref 0.2–1.2)
Total Protein: 7.5 g/dL (ref 6.0–8.3)

## 2020-08-11 LAB — CBC WITH DIFFERENTIAL/PLATELET
Basophils Absolute: 0.1 10*3/uL (ref 0.0–0.1)
Basophils Relative: 0.7 % (ref 0.0–3.0)
Eosinophils Absolute: 0.3 10*3/uL (ref 0.0–0.7)
Eosinophils Relative: 3.9 % (ref 0.0–5.0)
HCT: 46.9 % — ABNORMAL HIGH (ref 36.0–46.0)
Hemoglobin: 15.8 g/dL — ABNORMAL HIGH (ref 12.0–15.0)
Lymphocytes Relative: 32.2 % (ref 12.0–46.0)
Lymphs Abs: 2.6 10*3/uL (ref 0.7–4.0)
MCHC: 33.6 g/dL (ref 30.0–36.0)
MCV: 90.2 fl (ref 78.0–100.0)
Monocytes Absolute: 0.7 10*3/uL (ref 0.1–1.0)
Monocytes Relative: 9 % (ref 3.0–12.0)
Neutro Abs: 4.4 10*3/uL (ref 1.4–7.7)
Neutrophils Relative %: 54.2 % (ref 43.0–77.0)
Platelets: 272 10*3/uL (ref 150.0–400.0)
RBC: 5.2 Mil/uL — ABNORMAL HIGH (ref 3.87–5.11)
RDW: 13.1 % (ref 11.5–15.5)
WBC: 8.1 10*3/uL (ref 4.0–10.5)

## 2020-08-11 LAB — BASIC METABOLIC PANEL
BUN: 16 mg/dL (ref 6–23)
CO2: 20 mEq/L (ref 19–32)
Calcium: 9.6 mg/dL (ref 8.4–10.5)
Chloride: 105 mEq/L (ref 96–112)
Creatinine, Ser: 1.02 mg/dL (ref 0.40–1.20)
GFR: 57.73 mL/min — ABNORMAL LOW (ref >60.00)
Glucose, Bld: 83 mg/dL (ref 70–99)
Potassium: 4.4 mEq/L (ref 3.5–5.1)
Sodium: 136 mEq/L (ref 135–145)

## 2020-08-11 LAB — TSH: TSH: 1.69 u[IU]/mL (ref 0.35–4.50)

## 2020-08-11 NOTE — Patient Instructions (Signed)
Schedule your complete physical in 6 months We'll notify you of your lab results and make any changes if needed Make sure you are taking care of yourself! If mood changes and we need to talk meds, let me know Call with any questions or concerns Hang in there!

## 2020-08-11 NOTE — Assessment & Plan Note (Signed)
Chronic problem.  On Crestor 10mg daily w/o difficulty.  Check labs.  Adjust meds prn  

## 2020-08-11 NOTE — Addendum Note (Signed)
Addended by: Genevie Cheshire L on: 08/11/2020 02:10 PM   Modules accepted: Orders

## 2020-08-11 NOTE — Assessment & Plan Note (Signed)
New.  Husband was dx'd w/ Lymphoma earlier this spring and has had multiple issues with both medical issues and treatment side effects.  He actually has had 2 frontal strokes and his personality has changed- making things even harder for pt.  She has Clonazepam available to use as needed.  Discussed SSRI but she is not interested at this time.  Starts seeing a counselor on Friday.  Applauded this decision.  Offered my support and to let me know if I can help in any way.

## 2020-08-11 NOTE — Progress Notes (Signed)
   Subjective:    Patient ID: Christie Thomas, female    DOB: 06/27/1955, 65 y.o.   MRN: [id number]  HPI Hyperlipidemia- chronic problem, on Crestor 10mg  daily and Fenofibrate 160mg  daily.  Denies CP, SOB, abd pain, N/V  Obesity- BMI is 33.51  Little time to exercise while caring for husband during his multiple medical issues this Spring.  Caregiver stress- 'i'm exhausted'.  1 daughter Christie Thomas) has come home and is attempting to help.  Husband was hospitalized again last week.  Has had 2 frontal strokes- personality has changed.  Estranged daughter Christie Thomas) returned home (husband brought her home) at [place] and she had to place her in a rehab in [place].  Has Clonazepam to use prn.  Is starting therapy on Friday.  Not interested in daily medication at this time.   Review of Systems For ROS see HPI   This visit occurred during the SARS-CoV-2 public health emergency.  Safety protocols were in place, including screening questions prior to the visit, additional usage of staff PPE, and extensive cleaning of exam room while observing appropriate contact time as indicated for disinfecting solutions.       Objective:   Physical Exam Vitals reviewed.  Constitutional:      General: She is not in acute distress.    Appearance: Normal appearance. She is well-developed. She is obese. She is not ill-appearing.  HENT:     Head: Normocephalic and atraumatic.  Eyes:     Conjunctiva/sclera: Conjunctivae normal.     Pupils: Pupils are equal, round, and reactive to light.  Neck:     Thyroid: No thyromegaly.  Cardiovascular:     Rate and Rhythm: Normal rate and regular rhythm.     Pulses: Normal pulses.     Heart sounds: Normal heart sounds. No murmur heard.   Pulmonary:     Effort: Pulmonary effort is normal. No respiratory distress.     Breath sounds: Normal breath sounds.  Abdominal:     General: There is no distension.     Palpations: Abdomen is soft.     Tenderness: There is no abdominal  tenderness.  Musculoskeletal:     Cervical back: Normal range of motion and neck supple.     Right lower leg: No edema.     Left lower leg: No edema.  Lymphadenopathy:     Cervical: No cervical adenopathy.  Skin:    General: Skin is warm and dry.  Neurological:     Mental Status: She is alert and oriented to person, place, and time.  Psychiatric:        Mood and Affect: Mood normal.        Behavior: Behavior normal.        Thought Content: Thought content normal.           Assessment & Plan:

## 2020-08-11 NOTE — Assessment & Plan Note (Signed)
Ongoing issue for pt.  BMI is 33.51  She is not able to exercise at this time due to the stress of being caregiver for her husband.  Encouraged her to also care for herself.  Will continue to follow.

## 2020-08-16 ENCOUNTER — Other Ambulatory Visit: Payer: Self-pay | Admitting: Family Medicine

## 2020-08-16 DIAGNOSIS — E785 Hyperlipidemia, unspecified: Secondary | ICD-10-CM

## 2020-08-18 NOTE — Telephone Encounter (Signed)
LFD 12/04/19 #60 with 1 refill LOV 08/11/20 NOV none

## 2020-08-31 DIAGNOSIS — E038 Other specified hypothyroidism: Secondary | ICD-10-CM | POA: Diagnosis not present

## 2020-08-31 DIAGNOSIS — N951 Menopausal and female climacteric states: Secondary | ICD-10-CM | POA: Diagnosis not present

## 2020-09-02 DIAGNOSIS — N951 Menopausal and female climacteric states: Secondary | ICD-10-CM | POA: Diagnosis not present

## 2020-09-02 DIAGNOSIS — R232 Flushing: Secondary | ICD-10-CM | POA: Diagnosis not present

## 2020-09-02 DIAGNOSIS — N898 Other specified noninflammatory disorders of vagina: Secondary | ICD-10-CM | POA: Diagnosis not present

## 2020-09-02 DIAGNOSIS — Z6832 Body mass index (BMI) 32.0-32.9, adult: Secondary | ICD-10-CM | POA: Diagnosis not present

## 2020-09-16 ENCOUNTER — Encounter: Payer: Self-pay | Admitting: *Deleted

## 2020-11-18 ENCOUNTER — Other Ambulatory Visit (HOSPITAL_BASED_OUTPATIENT_CLINIC_OR_DEPARTMENT_OTHER): Payer: Self-pay | Admitting: Family Medicine

## 2020-11-18 DIAGNOSIS — Z1231 Encounter for screening mammogram for malignant neoplasm of breast: Secondary | ICD-10-CM

## 2020-11-30 DIAGNOSIS — E038 Other specified hypothyroidism: Secondary | ICD-10-CM | POA: Diagnosis not present

## 2020-11-30 DIAGNOSIS — N951 Menopausal and female climacteric states: Secondary | ICD-10-CM | POA: Diagnosis not present

## 2020-12-02 DIAGNOSIS — R6882 Decreased libido: Secondary | ICD-10-CM | POA: Diagnosis not present

## 2020-12-02 DIAGNOSIS — N898 Other specified noninflammatory disorders of vagina: Secondary | ICD-10-CM | POA: Diagnosis not present

## 2020-12-02 DIAGNOSIS — N951 Menopausal and female climacteric states: Secondary | ICD-10-CM | POA: Diagnosis not present

## 2020-12-02 DIAGNOSIS — Z6833 Body mass index (BMI) 33.0-33.9, adult: Secondary | ICD-10-CM | POA: Diagnosis not present

## 2020-12-04 ENCOUNTER — Telehealth: Payer: Self-pay | Admitting: Family Medicine

## 2020-12-04 NOTE — Telephone Encounter (Signed)
Patient needs to know if she is due for a bone density.  States it has been 2 years.  Please order if needed and advise patient.

## 2020-12-04 NOTE — Telephone Encounter (Signed)
Patient called and stated that she is over due for her dexa scan. I have looked into her health maintenance and she has been over due since 10/21/20. Please advise

## 2020-12-07 ENCOUNTER — Telehealth (HOSPITAL_BASED_OUTPATIENT_CLINIC_OR_DEPARTMENT_OTHER): Payer: Self-pay

## 2020-12-07 ENCOUNTER — Other Ambulatory Visit: Payer: Self-pay

## 2020-12-07 DIAGNOSIS — M8580 Other specified disorders of bone density and structure, unspecified site: Secondary | ICD-10-CM

## 2020-12-07 NOTE — Telephone Encounter (Signed)
Dexa scan has been ordered and sent to previous imaging office. Called and notified pt. Pt understood. No further concerns at this time.

## 2020-12-07 NOTE — Telephone Encounter (Signed)
Ok to order DEXA at same imaging place as previous scan.  Dx osteopenia

## 2020-12-15 ENCOUNTER — Ambulatory Visit (HOSPITAL_BASED_OUTPATIENT_CLINIC_OR_DEPARTMENT_OTHER): Payer: BC Managed Care – PPO

## 2020-12-17 ENCOUNTER — Other Ambulatory Visit: Payer: Self-pay | Admitting: Family Medicine

## 2020-12-17 DIAGNOSIS — E785 Hyperlipidemia, unspecified: Secondary | ICD-10-CM

## 2021-01-05 ENCOUNTER — Other Ambulatory Visit: Payer: Self-pay

## 2021-01-05 ENCOUNTER — Ambulatory Visit (HOSPITAL_BASED_OUTPATIENT_CLINIC_OR_DEPARTMENT_OTHER)
Admission: RE | Admit: 2021-01-05 | Discharge: 2021-01-05 | Disposition: A | Discharge status: Home / Self Care | Payer: BC Managed Care – PPO | Source: Ambulatory Visit | Attending: Family Medicine | Admitting: Family Medicine

## 2021-01-05 ENCOUNTER — Encounter (HOSPITAL_BASED_OUTPATIENT_CLINIC_OR_DEPARTMENT_OTHER): Payer: Self-pay

## 2021-01-05 DIAGNOSIS — Z1231 Encounter for screening mammogram for malignant neoplasm of breast: Secondary | ICD-10-CM

## 2021-01-05 DIAGNOSIS — M8580 Other specified disorders of bone density and structure, unspecified site: Secondary | ICD-10-CM

## 2021-01-05 DIAGNOSIS — M85852 Other specified disorders of bone density and structure, left thigh: Secondary | ICD-10-CM | POA: Diagnosis not present

## 2021-01-11 ENCOUNTER — Other Ambulatory Visit: Payer: Self-pay | Admitting: Family Medicine

## 2021-01-11 DIAGNOSIS — R928 Other abnormal and inconclusive findings on diagnostic imaging of breast: Secondary | ICD-10-CM

## 2021-01-29 ENCOUNTER — Ambulatory Visit
Admission: RE | Admit: 2021-01-29 | Discharge: 2021-01-29 | Disposition: A | Discharge status: Home / Self Care | Payer: BC Managed Care – PPO | Source: Ambulatory Visit | Attending: Family Medicine | Admitting: Family Medicine

## 2021-01-29 ENCOUNTER — Other Ambulatory Visit: Payer: Self-pay | Admitting: Family Medicine

## 2021-01-29 ENCOUNTER — Other Ambulatory Visit: Payer: Self-pay

## 2021-01-29 DIAGNOSIS — R928 Other abnormal and inconclusive findings on diagnostic imaging of breast: Secondary | ICD-10-CM

## 2021-01-29 DIAGNOSIS — R922 Inconclusive mammogram: Secondary | ICD-10-CM | POA: Diagnosis not present

## 2021-02-03 ENCOUNTER — Ambulatory Visit
Admission: RE | Admit: 2021-02-03 | Discharge: 2021-02-03 | Disposition: A | Discharge status: Home / Self Care | Payer: BC Managed Care – PPO | Source: Ambulatory Visit | Attending: Family Medicine | Admitting: Family Medicine

## 2021-02-03 ENCOUNTER — Other Ambulatory Visit: Payer: Self-pay

## 2021-02-03 DIAGNOSIS — C50211 Malignant neoplasm of upper-inner quadrant of right female breast: Secondary | ICD-10-CM | POA: Diagnosis not present

## 2021-02-03 DIAGNOSIS — N6312 Unspecified lump in the right breast, upper inner quadrant: Secondary | ICD-10-CM | POA: Diagnosis not present

## 2021-02-03 DIAGNOSIS — R928 Other abnormal and inconclusive findings on diagnostic imaging of breast: Secondary | ICD-10-CM

## 2021-02-05 ENCOUNTER — Other Ambulatory Visit: Payer: Self-pay | Admitting: Family Medicine

## 2021-02-05 DIAGNOSIS — E785 Hyperlipidemia, unspecified: Secondary | ICD-10-CM

## 2021-02-09 DIAGNOSIS — C801 Malignant (primary) neoplasm, unspecified: Secondary | ICD-10-CM

## 2021-02-09 HISTORY — DX: Malignant (primary) neoplasm, unspecified: C80.1

## 2021-02-17 DIAGNOSIS — Z170 Estrogen receptor positive status [ER+]: Secondary | ICD-10-CM | POA: Diagnosis not present

## 2021-02-17 DIAGNOSIS — C50211 Malignant neoplasm of upper-inner quadrant of right female breast: Secondary | ICD-10-CM | POA: Diagnosis not present

## 2021-02-18 ENCOUNTER — Other Ambulatory Visit: Payer: Self-pay | Admitting: General Surgery

## 2021-02-18 DIAGNOSIS — C50211 Malignant neoplasm of upper-inner quadrant of right female breast: Secondary | ICD-10-CM

## 2021-02-22 DIAGNOSIS — H40023 Open angle with borderline findings, high risk, bilateral: Secondary | ICD-10-CM | POA: Diagnosis not present

## 2021-02-22 DIAGNOSIS — H2513 Age-related nuclear cataract, bilateral: Secondary | ICD-10-CM | POA: Diagnosis not present

## 2021-02-24 ENCOUNTER — Encounter: Payer: Self-pay | Admitting: *Deleted

## 2021-02-24 NOTE — Progress Notes (Signed)
Reached out to Christie Thomas to introduce myself as the office RN Navigator and explain our new patient process. Reviewed the reason for their referral and scheduled their new patient appointment along with labs. Provided address and directions to the office including call back phone number. Reviewed with patient any concerns they may have or any possible barriers to attending their appointment.   Informed patient about my role as a navigator and that I will meet with them prior to their New Patient appointment and more fully discuss what services I can provide. At this time patient has no further questions or needs.    Patient is the wife of one of our current oncology patients. She has seen Dr Marlou Starks on 02/17/2021 with a plan for lumpectomy after consulting plastics regarding her ruptured implant. She is already scheduled with Radiation Oncology on 03/05/2021.  Oncology Nurse Navigator Documentation  Oncology Nurse Navigator Flowsheets 02/24/2021  Abnormal Finding Date 01/09/2021  Confirmed Diagnosis Date 02/03/2021  Navigator Follow Up Date: 03/03/2021  Navigator Follow Up Reason: New Patient Appointment  Navigator Location [facility]  Navigator Encounter Type Introductory Phone Call  Patient Visit Type MedOnc  Treatment Phase Pre-Tx/Tx Discussion  Barriers/Navigation Needs Coordination of Care;Education  Education Other  Interventions Coordination of Care;Psycho-Social Support  Acuity Level 2-Minimal Needs (1-2 Barriers Identified)  Coordination of Care Appts  Support Groups/Services Friends and Family  Time Spent with Patient 31

## 2021-03-01 DIAGNOSIS — H2511 Age-related nuclear cataract, right eye: Secondary | ICD-10-CM | POA: Diagnosis not present

## 2021-03-01 DIAGNOSIS — Z01818 Encounter for other preprocedural examination: Secondary | ICD-10-CM | POA: Diagnosis not present

## 2021-03-03 ENCOUNTER — Other Ambulatory Visit: Payer: Self-pay

## 2021-03-03 ENCOUNTER — Inpatient Hospital Stay: Payer: BC Managed Care – PPO | Attending: Hematology & Oncology

## 2021-03-03 ENCOUNTER — Inpatient Hospital Stay (HOSPITAL_BASED_OUTPATIENT_CLINIC_OR_DEPARTMENT_OTHER): Payer: BC Managed Care – PPO | Admitting: Hematology & Oncology

## 2021-03-03 ENCOUNTER — Encounter: Payer: Self-pay | Admitting: *Deleted

## 2021-03-03 ENCOUNTER — Encounter: Payer: Self-pay | Admitting: Hematology & Oncology

## 2021-03-03 VITALS — BP 134/74 | HR 69 | Temp 99.2°F | Resp 16 | Ht 64.0 in | Wt 196.0 lb

## 2021-03-03 DIAGNOSIS — C50111 Malignant neoplasm of central portion of right female breast: Secondary | ICD-10-CM | POA: Diagnosis not present

## 2021-03-03 DIAGNOSIS — Z79899 Other long term (current) drug therapy: Secondary | ICD-10-CM

## 2021-03-03 DIAGNOSIS — Z170 Estrogen receptor positive status [ER+]: Secondary | ICD-10-CM | POA: Insufficient documentation

## 2021-03-03 LAB — CBC WITH DIFFERENTIAL (CANCER CENTER ONLY)
Abs Immature Granulocytes: 0.02 10*3/uL (ref 0.00–0.07)
Basophils Absolute: 0.1 10*3/uL (ref 0.0–0.1)
Basophils Relative: 1 %
Eosinophils Absolute: 0.3 10*3/uL (ref 0.0–0.5)
Eosinophils Relative: 4 %
HCT: 43.4 % (ref 36.0–46.0)
Hemoglobin: 14.8 g/dL (ref 12.0–15.0)
Immature Granulocytes: 0 %
Lymphocytes Relative: 36 %
Lymphs Abs: 2.9 10*3/uL (ref 0.7–4.0)
MCH: 30.7 pg (ref 26.0–34.0)
MCHC: 34.1 g/dL (ref 30.0–36.0)
MCV: 90 fL (ref 80.0–100.0)
Monocytes Absolute: 0.9 10*3/uL (ref 0.1–1.0)
Monocytes Relative: 11 %
Neutro Abs: 3.9 10*3/uL (ref 1.7–7.7)
Neutrophils Relative %: 48 %
Platelet Count: 227 10*3/uL (ref 150–400)
RBC: 4.82 MIL/uL (ref 3.87–5.11)
RDW: 12.8 % (ref 11.5–15.5)
WBC Count: 8.1 10*3/uL (ref 4.0–10.5)
nRBC: 0 % (ref 0.0–0.2)

## 2021-03-03 LAB — CMP (CANCER CENTER ONLY)
ALT: 63 U/L — ABNORMAL HIGH (ref 0–44)
AST: 61 U/L — ABNORMAL HIGH (ref 15–41)
Albumin: 4.2 g/dL (ref 3.5–5.0)
Alkaline Phosphatase: 94 U/L (ref 38–126)
Anion gap: 7 (ref 5–15)
BUN: 16 mg/dL (ref 8–23)
CO2: 24 mmol/L (ref 22–32)
Calcium: 10.1 mg/dL (ref 8.9–10.3)
Chloride: 108 mmol/L (ref 98–111)
Creatinine: 0.93 mg/dL (ref 0.44–1.00)
GFR, Estimated: >60 mL/min (ref >60)
Glucose, Bld: 100 mg/dL — ABNORMAL HIGH (ref 70–99)
Potassium: 3.9 mmol/L (ref 3.5–5.1)
Sodium: 139 mmol/L (ref 135–145)
Total Bilirubin: 0.8 mg/dL (ref 0.3–1.2)
Total Protein: 7.1 g/dL (ref 6.5–8.1)

## 2021-03-03 LAB — LACTATE DEHYDROGENASE: LDH: 167 U/L (ref 98–192)

## 2021-03-03 NOTE — Progress Notes (Signed)
Referral MD  Reason for Referral: Stage 1 lobular carcinoma of the RIGHT breast -- ER+/PR-/HER2-  Chief Complaint  Patient presents with   New Patient (Initial Visit)  : I have breast cancer.  HPI: Christie Thomas is known to [place].  She is the wife of one of our patients.  She is incredibly charming 65 year old white female.  She is postmenopausal.  However, she has been on hormonal therapy.  She has been getting routine mammograms.  She had 1 recently.  This did show an abnormality in the right breast at about the 7 o'clock position.  She then had a diagnostic mammogram.  This was done on 01/29/2021.  This did show a 0.9 x 0.7 x 0.7 cm abnormality at the 2 o'clock position.  This was about 2 cm from the nipple.  She then had a biopsy done.  This was done on 02/03/2021.  The pathology report ([id number]) showed an invasive lobular carcinoma.  It measured 7 mm.  It had a low grade.  The tumor was ER+/PR-/HER2-..  There was a very low proliferation marker 1%.  She has seen Dr. Marlou Starks of surgery.  He wants to get an MRI.  She has had implants in.  One of them apparently has leaked.  She sees the Psychiatric nurse on Monday..  She sees radiation therapy this Friday.  I would have to believe that this is going to be a stage I breast cancer.  She has no family history of breast cancer.  I think a paternal grandmother had uterine cancer.  She is of [place] heritage.  She does not smoke.  She really does not have alcoholic intake.  She is active.  She exercises.  Currently, I would say performance status is ECOG 0.   Past Medical History:  Diagnosis Date   Anxiety 01/20/2016   Asthma    Diverticulitis 01/20/2016   Diverticulosis 01/20/2016   Epilepsy ([place])    Epilepsy ([place]) 01/20/2016   Adolescent    Fatty liver 01/20/2016   GERD (gastroesophageal reflux disease)    Hay fever    Hyperlipidemia    IBS (irritable bowel syndrome) 01/20/2016   Obesity 01/20/2016   OSA (obstructive sleep  apnea) 09/07/2017   Osteoarthritis of both hands 01/20/2016   Swelling of ankle joint, left 01/20/2016   Trochanteric bursitis 01/20/2016  :   Past Surgical History:  Procedure Laterality Date   AUGMENTATION MAMMAPLASTY     BACK SURGERY     x2   BREAST BIOPSY     fatty tumor   BREAST ENHANCEMENT SURGERY     CARDIAC CATHETERIZATION  07/2010   CARPAL TUNNEL RELEASE     CESAREAN SECTION     CHOLECYSTECTOMY     HAND SURGERY     carpal tunnel release, cyst excision   SHOULDER SURGERY     right   TONSILLECTOMY AND ADENOIDECTOMY    :   Current Outpatient Medications:    albuterol (PROVENTIL HFA;VENTOLIN HFA) 108 (90 Base) MCG/ACT inhaler, Inhale 2 puffs into the lungs every 4 (four) hours as needed., Disp: 1 Inhaler, Rfl: 6   AMBULATORY NON FORMULARY MEDICATION, Ultra flora IB -- once daily, Disp: , Rfl:    Cholecalciferol (VITAMIN D PO), Take by mouth., Disp: , Rfl:    clonazePAM (KLONOPIN) 0.5 MG tablet, TAKE 1 TABLET 3 TIMES DAILY AS NEEDED FOR ANXIETY., Disp: 60 tablet, Rfl: 1   fenofibrate 160 MG tablet, TAKE 1 TABLET ONCE DAILY WITH FOOD., Disp: 90 tablet,  Rfl: 0 °  fluticasone (FLONASE) 50 MCG/ACT nasal spray, instill 1 to 2 sprays into each nostril daily, Disp: 16 g, Rfl: 3 °  QVAR 40 MCG/ACT inhaler, inhale 2 puffs INTO THE LUNGS BY MOUTH twice a day, Disp: 8.7 g, Rfl: 6 °  rosuvastatin (CRESTOR) 10 MG tablet, TAKE 1 TABLET ONCE DAILY., Disp: 90 tablet, Rfl: 0 °  TURMERIC CURCUMIN PO, Take 500 mg by mouth 2 (two) times daily., Disp: , Rfl: : ° °: ° ° °Allergies  °Allergen Reactions  ° Penicillins   °  Throat swelling °  ° Shellfish Allergy Anaphylaxis  ° Metoclopramide Other (See Comments)  °: ° ° °Family History  °Problem Relation Age of Onset  ° Stroke Mother   ° Hypertension Mother   ° Hyperlipidemia Mother   ° Diabetes Mother   ° Cancer Mother   ° Sudden death Paternal Grandfather 35  ° Alzheimer's disease Father   ° Heart attack Neg Hx   °: ° ° °Social History  ° °Socioeconomic  History  ° Marital status: Married  °  Spouse name: Not on file  ° Number of children: Not on file  ° Years of education: Not on file  ° Highest education level: Not on file  °Occupational History  ° Not on file  °Tobacco Use  ° Smoking status: Never  ° Smokeless tobacco: Never  °Vaping Use  ° Vaping Use: Never used  °Substance and Sexual Activity  ° Alcohol use: No  ° Drug use: No  ° Sexual activity: Not on file  °Other Topics Concern  ° Not on file  °Social History Narrative  ° Not on file  ° °Social Determinants of Health  ° °Financial Resource Strain: Not on file  °Food Insecurity: Not on file  °Transportation Needs: Not on file  °Physical Activity: Not on file  °Stress: Not on file  °Social Connections: Not on file  °Intimate Partner Violence: Not on file  °: ° °Review of Systems  °Constitutional: Negative.   °HENT: Negative.    °Eyes: Negative.   °Respiratory: Negative.    °Cardiovascular: Negative.   °Gastrointestinal: Negative.   °Genitourinary: Negative.   °Musculoskeletal: Negative.   °Skin: Negative.   °Neurological: Negative.   °Endo/Heme/Allergies: Negative.   °Psychiatric/Behavioral: Negative.    ° ° °Exam: °@IPVITALS@ °This is a well-developed and well-nourished white female in no obvious distress.  Vital signs are temperature of 99.2.  Pulse 69.  Blood pressure 134/74.  Weight is 196 pounds.  Head and neck exam shows no ocular or oral lesions.  There are no palpable cervical or supraclavicular lymph nodes.  Lungs are clear to percussion and auscultation bilaterally.  Breast exam shows a left breast with no masses, edema or erythema.  There is no left axillary adenopathy.  Right breast is with the biopsy is site.  This is at the  2 o'clock position.  She has no obvious masses in the right breast.  There is no right axillary adenopathy.  Abdomen is soft.  She has good bowel sounds.  There is no fluid wave.  There is no palpable liver or spleen tip.  Back exam shows no tenderness, ribs or hips.   Extremities shows no clubbing, cyanosis or edema.  She has good range of motion of her joints.  Neurological exam shows no focal neurological deficits.  Skin exam shows no rashes, ecchymosis or petechia. ° ° °Recent Labs  °  03/03/21 °1117  °WBC 8.1  °HGB 14.8  °HCT   43.4  °PLT 227  ° ° °Recent Labs  °  03/03/21 °1117  °NA 139  °K 3.9  °CL 108  °CO2 24  °GLUCOSE 100*  °BUN 16  °CREATININE 0.93  °CALCIUM 10.1  ° ° °Blood smear review: None ° °Pathology: See above ° ° ° °Assessment and Plan: Ms. Hertenstein is a very charming 65-year-old postmenopausal white female.  I do suspect that this could be a low-grade early stage lobular carcinoma of the right breast. ° °Everything from my point of view shows that this is going to be a good prognostic breast cancer.  I think that the proliferation index of only 1% is a true indicator of what we are looking at. ° °She has not decided whether she wants to have surgery wise.  She needs to be with plastic surgery.  She needs to meet with Radiation Oncology. ° °I would be shocked if she would need any chemotherapy.  However, I would still make sure we send off an Oncotype assay on her. ° °I would have to believe that she would just need an aromatase inhibitor after surgery. ° °She really not too worried about this.  She feels very reassured. ° °I will plan to see her back once we know when her surgery will be.  We will probably see her back about a month after her surgery.  By then, we will have all the information that we need to make recommendations. °  °

## 2021-03-03 NOTE — Progress Notes (Signed)
Initial RN Navigator Patient Visit  Name: Christie Thomas Date of Referral : 02/24/2021 Diagnosis: Breast Cancer  Met with patient prior to their visit with MD. Hanley Seamen patient "Your Patient Navigator" handout which explains my role, areas in which I am able to help, and all the contact information for myself and the office. Also gave patient MD and Navigator business card. Reviewed with patient the general overview of expected course after initial diagnosis and time frame for all steps to be completed.  New patient packet given to patient which includes: orientation to office and staff; campus directory; education on My Chart and Advance Directives; and patient centered education on breast cancer.   Patient completed visit with Dr. Marin Olp.  Following have already been scheduled:  Radiation Consult - 03/05/2021 Plastics Consult - 03/08/2021 Physical Therapy - 03/17/2021  Patient still needs MRI scheduled. She has the phone number to schedule this scan. She is going to call later today and notify me if there are any issues.   Patient understands all follow up procedures and expectations. They have my number to reach out for any further clarification or additional needs.    Oncology Nurse Navigator Documentation  Oncology Nurse Navigator Flowsheets 03/03/2021  Abnormal Finding Date -  Confirmed Diagnosis Date -  Navigator Follow Up Date: 03/04/2021  Navigator Follow Up Reason: Other:  Production assistant, radio Encounter Type Initial MedOnc  Patient Visit Type MedOnc  Treatment Phase Pre-Tx/Tx Discussion  Barriers/Navigation Needs Coordination of Care;Education  Education Newly Diagnosed Cancer Education;Pain/ Symptom Management  Interventions Coordination of Care;Education;Psycho-Social Support  Acuity Level 2-Minimal Needs (1-2 Barriers Identified)  Coordination of Care -  Education Method Verbal;Written  Support Groups/Services Friends and Family  Time  Spent with Patient 30

## 2021-03-04 DIAGNOSIS — H2511 Age-related nuclear cataract, right eye: Secondary | ICD-10-CM | POA: Diagnosis not present

## 2021-03-04 DIAGNOSIS — H52223 Regular astigmatism, bilateral: Secondary | ICD-10-CM | POA: Diagnosis not present

## 2021-03-04 NOTE — Progress Notes (Signed)
Radiation Oncology         [phone] ________________________________  Initial Outpatient Consultation  Name: Christie Thomas MRN: [id number]  Date: 03/05/2021  DOB: September 15, 1955  YI:RSWNIO, Aundra Millet, MD  Jovita Kussmaul, MD   REFERRING PHYSICIAN: Autumn Messing III, MD  DIAGNOSIS:    ICD-10-CM   1. Malignant neoplasm of upper-inner quadrant of right breast in female, estrogen receptor positive ([place])  C50.211    Z17.0       Cancer Staging  Breast cancer of upper-inner quadrant of right female breast [facility]) Staging form: Breast, AJCC 8th Edition - Clinical stage from 03/05/2021: Stage IA (cT1b, cN0, cM0, G2, ER+, PR-, HER2-) - Signed by Eppie Gibson, MD on 03/05/2021 Stage prefix: Initial diagnosis Histologic grading system: 3 grade system   CHIEF COMPLAINT: Here to discuss management of right breast cancer  HISTORY OF PRESENT ILLNESS::Christie Thomas is a 65 y.o. female who presented with right breast abnormality on the following imaging: bilateral screening mammogram on the date of 01/05/21.  No symptoms, if any, were reported at that time, though screening mammogram also showed a suspected right breast extracapsular implant rupture (patient has bilateral retropectoral breast implants).   Right breast mammogram and ultrasound on 01/29/21 further revealed a suspicious mass in the 2 o'clock position of the right breast, 2 cmfn, warranting tissue diagnosis. Right breast implant rupture was again appreciated.    Right breast biopsy at the 2 o'clock position, 2 cmfn, on date of 02/03/21 showed invasive mammary/lobular carcinoma measuring 0.7 cm in the greatest linear extent, and mammary carcinoma in-situ.  ER status: 15% positive with moderate staining intensity; PR status <1% negative, Her2 status negative; proliferation marker Ki67 at 1%; Grade 1-2. No lymph nodes were examined.   Subsequently, the patient was referred to Dr. Marlou Starks on 02/17/21 for further evaluation and  recommendation regarding treatment. Given the lobular nature of her cancer, Dr. Marlou Starks recommended further evaluation with MRI. In addition to referrals placed to medical and radiation oncology, a referral was placed for physical therapy for preoperative lymphedema testing. The patient also expressed interest in meeting with plastic surgery to discuss if her implants should be removed (patient had bilateral implants placed in the 1980's). She will return back to Dr. Marlou Starks to discuss surgery further following consultations with rad onc and medical onc.   The patient also met with Dr. Marin Olp on 03/03/21. Per Dr. Marin Olp, the patient will not likely require chemotherapy given low grade, though Oncotype testing will be ordered just to be sure.    Lymphedema issues, if any:  Patient denies. Did have shoulder surgery to the right shoulder ~12 years ago   Pain issues, if any:  Patient denies   SAFETY ISSUES: Prior radiation? No Pacemaker/ICD? No Possible current pregnancy? No--postmenopausal  Is the patient on methotrexate? No  Current Complaints / other details:  Waiting to have breast MRI; Scheduled for plastic surgery consultation (Dr. Lyndee Leo Dillingham) 03/08/2021. Had cataract surgery on right eye yesterday, and is scheduled for left eye in ~2 weeks  She is with her husband today. They previously lived in [place], [place], and [place].     PREVIOUS RADIATION THERAPY: No  PAST MEDICAL HISTORY:  has a past medical history of Anxiety (01/20/2016), Asthma, Diverticulitis (01/20/2016), Diverticulosis (01/20/2016), Epilepsy ([place]), Epilepsy ([place]) (01/20/2016), Fatty liver (01/20/2016), GERD (gastroesophageal reflux disease), Hay fever, Hyperlipidemia, IBS (irritable bowel syndrome) (01/20/2016), Obesity (01/20/2016), OSA (obstructive sleep apnea) (09/07/2017), Osteoarthritis of both hands (01/20/2016), Swelling of ankle joint, left (01/20/2016),  and Trochanteric bursitis (01/20/2016).    PAST SURGICAL  HISTORY: Past Surgical History:  Procedure Laterality Date   AUGMENTATION MAMMAPLASTY     BACK SURGERY     x2   BREAST BIOPSY     fatty tumor   BREAST ENHANCEMENT SURGERY     CARDIAC CATHETERIZATION  07/2010   CARPAL TUNNEL RELEASE     CESAREAN SECTION     CHOLECYSTECTOMY     HAND SURGERY     carpal tunnel release, cyst excision   SHOULDER SURGERY     right   TONSILLECTOMY AND ADENOIDECTOMY      FAMILY HISTORY: family history includes Alzheimer's disease in her father; Cancer in her mother; Diabetes in her mother; Hyperlipidemia in her mother; Hypertension in her mother; Stroke in her mother; Sudden death (age of onset: 72) in her paternal grandfather.  SOCIAL HISTORY:  reports that she has never smoked. She has never used smokeless tobacco. She reports that she does not drink alcohol and does not use drugs.  ALLERGIES: Iodine, Penicillins, Shellfish allergy, Cephalexin, Ciprofloxacin, and Metoclopramide  MEDICATIONS:  Current Outpatient Medications  Medication Sig Dispense Refill   albuterol (PROVENTIL HFA;VENTOLIN HFA) 108 (90 Base) MCG/ACT inhaler Inhale 2 puffs into the lungs every 4 (four) hours as needed. 1 Inhaler 6   AMBULATORY NON FORMULARY MEDICATION Ultra flora IB -- once daily     Cholecalciferol (VITAMIN D PO) Take by mouth.     clonazePAM (KLONOPIN) 0.5 MG tablet TAKE 1 TABLET 3 TIMES DAILY AS NEEDED FOR ANXIETY. 60 tablet 1   fenofibrate 160 MG tablet TAKE 1 TABLET ONCE DAILY WITH FOOD. 90 tablet 0   fluticasone (FLONASE) 50 MCG/ACT nasal spray instill 1 to 2 sprays into each nostril daily 16 g 3   QVAR 40 MCG/ACT inhaler inhale 2 puffs INTO THE LUNGS BY MOUTH twice a day 8.7 g 6   rosuvastatin (CRESTOR) 10 MG tablet TAKE 1 TABLET ONCE DAILY. 90 tablet 0   TURMERIC CURCUMIN PO Take 500 mg by mouth 2 (two) times daily.     No current facility-administered medications for this encounter.    REVIEW OF SYSTEMS: As above in HPI.   PHYSICAL EXAM:  weight is 197 lb  (89.4 kg). Her blood pressure is 138/70 and her pulse is 70. Her respiration is 17 and oxygen saturation is 99%.   General: Alert and oriented, in no acute distress HEENT: right eye sclera is erythematous Neck: Neck is supple, no palpable cervical or supraclavicular lymphadenopathy. Heart: Regular in rate and rhythm with no murmurs, rubs, or gallops. Chest: Clear to auscultation bilaterally, with no rhonchi, wheezes, or rales. Musculoskeletal: symmetric strength and muscle tone throughout. Neurologic: Cranial nerves II through XII are grossly intact. No obvious focalities. Speech is fluent. Coordination is intact. Psychiatric: Judgment and insight are intact. Affect is appropriate. Breasts: No palpable masses appreciated in the breasts or axillae bilaterally .    ECOG = 0  0 - Asymptomatic (Fully active, able to carry on all predisease activities without restriction)  1 - Symptomatic but completely ambulatory (Restricted in physically strenuous activity but ambulatory and able to carry out work of a light or sedentary nature. For example, light housework, office work)  2 - Symptomatic, <50% in bed during the day (Ambulatory and capable of all self care but unable to carry out any work activities. Up and about more than 50% of waking hours)  3 - Symptomatic, >50% in bed, but not bedbound (Capable of only limited  self-care, confined to bed or chair 50% or more of waking hours)  4 - Bedbound (Completely disabled. Cannot carry on any self-care. Totally confined to bed or chair)  5 - Death   Eustace Pen MM, Creech RH, Tormey DC, et al. [phone]). "Toxicity and response criteria of the [facility]". [place] Oncol. 5 (6): 649-55   LABORATORY DATA:  Lab Results  Component Value Date   WBC 8.1 03/03/2021   HGB 14.8 03/03/2021   HCT 43.4 03/03/2021   MCV 90.0 03/03/2021   PLT 227 03/03/2021   CMP     Component Value Date/Time   NA 139 03/03/2021 1117   NA 142  11/18/2015 0000   K 3.9 03/03/2021 1117   CL 108 03/03/2021 1117   CO2 24 03/03/2021 1117   GLUCOSE 100 (H) 03/03/2021 1117   BUN 16 03/03/2021 1117   BUN 17 11/18/2015 0000   CREATININE 0.93 03/03/2021 1117   CREATININE 1.03 (H) 06/21/2016 1011   CALCIUM 10.1 03/03/2021 1117   PROT 7.1 03/03/2021 1117   ALBUMIN 4.2 03/03/2021 1117   AST 61 (H) 03/03/2021 1117   ALT 63 (H) 03/03/2021 1117   ALKPHOS 94 03/03/2021 1117   BILITOT 0.8 03/03/2021 1117   GFRNONAA >60 03/03/2021 1117   GFRNONAA 59 (L) 06/21/2016 1011   GFRAA 68 06/21/2016 1011         RADIOGRAPHY: MM CLIP PLACEMENT RIGHT  Result Date: 02/03/2021 CLINICAL DATA:  Status post right breast ultrasound-guided biopsy. EXAM: 3D DIAGNOSTIC RIGHT MAMMOGRAM POST ULTRASOUND BIOPSY COMPARISON:  Previous exam(s). FINDINGS: 3D Mammographic images were obtained following ultrasound guided biopsy of the right breast. The biopsy marking clip is in expected position at the site of biopsy. IMPRESSION: Appropriate positioning of the ribbon shaped biopsy marking clip at the site of biopsy in the upper inner right breast. Final Assessment: Post Procedure Mammograms for Marker Placement Electronically Signed   By: Kristopher Oppenheim M.D.   On: 02/03/2021 13:30  [place] RT BREAST BX W LOC DEV 1ST LESION IMG BX SPEC US GUIDE  Addendum Date: 02/05/2021   ADDENDUM REPORT: 02/05/2021 08:32 ADDENDUM: Pathology revealed GRADE I-II INVASIVE MAMMARY CARCINOMA, MAMMARY CARCINOMA IN SITU of the RIGHT breast, 2 o'clock 2cmfn, (ribbon clip). This was found to be concordant by Dr. Kristopher Oppenheim. Pathology results were discussed with the patient by telephone. The patient reported doing well after the biopsy with tenderness at the site. Post biopsy instructions and care were reviewed and questions were answered. The patient was encouraged to call [facility] for any additional concerns. My direct phone number was provided. Surgical consultation  has been arranged with Dr. Autumn Messing at [facility] on February 17, 2021. Pathology results reported by Terie Purser, RN on 02/04/2021. Electronically Signed   By: Kristopher Oppenheim M.D.   On: 02/05/2021 08:32   Result Date: 02/05/2021 CLINICAL DATA:  65 year old female with a suspicious right breast mass. EXAM: ULTRASOUND GUIDED RIGHT BREAST CORE NEEDLE BIOPSY COMPARISON:  Previous exam(s). PROCEDURE: I met with the patient and we discussed the procedure of ultrasound-guided biopsy, including benefits and alternatives. We discussed the high likelihood of a successful procedure. We discussed the risks of the procedure, including infection, bleeding, tissue injury, clip migration, and inadequate sampling. Informed written consent was given. The usual time-out protocol was performed immediately prior to the procedure. Lesion quadrant: Upper inner quadrant Using sterile technique and 1% Lidocaine as local anesthetic, under direct ultrasound visualization, a  14 gauge spring-loaded device was used to perform biopsy of a mass at the 2 o'clock position using a inferior approach. At the conclusion of the procedure a ribbon shaped tissue marker clip was deployed into the biopsy cavity. Follow up 2 view mammogram was performed and dictated separately. IMPRESSION: Ultrasound guided biopsy of the right breast. No apparent complications. Electronically Signed: By: Kristopher Oppenheim M.D. On: 02/03/2021 13:17     IMPRESSION/PLAN: This is a very pleasant 65 yo woman with early stage right breast cancer. She is hoping to pursue breast conserving surgery.   It was a pleasure meeting the patient today. We discussed the risks, benefits, and side effects of radiotherapy. I recommend radiotherapy (in the setting of breast conserving surgery) to the right breast to reduce her risk of locoregional recurrence by 2/3.  We discussed that radiation would take approximately 4-6 weeks to complete and that I would give the patient a  few weeks to heal following surgery before starting treatment planning. She understands the need for radiotherapy is less likely after mastectomy, if she pursues that. We would need to review her final path. If chemotherapy were to be given, this would precede radiotherapy.   We spoke about acute effects including skin irritation and fatigue as well as much less common late effects including internal organ injury or irritation. We spoke about the latest technology that is used to minimize the risk of late effects for patients undergoing radiotherapy to the breast or chest wall. She understands the risk of capsular contracture/fibrosis which we discussed if she has implant in place during RT. No guarantees of treatment were given. The patient is enthusiastic about proceeding with treatment. I look forward to participating in the patient's care.  I will await her referral back to me for postoperative follow-up and eventual CT simulation/treatment planning.  On date of service, in total, I spent 50 minutes on this encounter. Patient was seen in person.   __________________________________________   Eppie Gibson, MD  This document serves as a record of services personally performed by Eppie Gibson, MD. It was created on her behalf by Roney Mans, a trained medical scribe. The creation of this record is based on the scribe's personal observations and the provider's statements to them. This document has been checked and approved by the attending provider.

## 2021-03-04 NOTE — Progress Notes (Signed)
Location of Breast Cancer:  Malignant neoplasm of upper-inner quadrant of right breast in female, estrogen receptor positive  Histology per Pathology Report:  (Definitive pathology pending eventual surgery) Diagnosis Breast, right, needle core biopsy, 2 o'clock 2cmfn - INVASIVE MAMMARY CARCINOMA - MAMMARY CARCINOMA IN SITU  Receptor Status: ER(15%), PR (Negative), Her2-neu (Negative), Ki-67(1%)  Did patient present with symptoms (if so, please note symptoms) or was this found on screening mammography?: Recent screening mammogram did show an abnormality in the right breast at about the 7 o'clock position.  She then had a diagnostic mammogram.  This was done on 01/29/2021.  This did show a 0.9 x 0.7 x 0.7 cm abnormality at the 2 o'clock position.  This was about 2 cm from the nipple  Past/Anticipated interventions by surgeon, if any:  Dr. Autumn Messing (office visit) 02/17/2021 The patient appears to have a small stage I invasive lobular cancer in the upper inner quadrant of the right breast with clinically negative nodes.  I have discussed with her in detail the different options for treatment. At this point she would like to meet with plastic surgery to talk about the implants that she has not whether they should be removed.  Given the lobular nature of the cancer we will also evaluate her with MRI.  I will refer her to medical and radiation oncology to discuss adjuvant therapy.  I will also refer her to physical therapy for preoperative lymphedema testing.  Once we have the rest of the information and everybody has had a chance to see her then she will be able to make her final decision as to how she would like to proceed and let us know   Past/Anticipated interventions by medical oncology, if any:  Under care of Dr. Burney Gauze 03/03/2021 Everything from my point of view shows that this is going to be a good prognostic breast cancer.  I think that the proliferation index of only 1% is a  true indicator of what we are looking at. She has not decided whether she wants to have surgery wise.  She needs to be with plastic surgery.  She needs to meet with Radiation Oncology. I would be shocked if she would need any chemotherapy.  However, I would still make sure we send off an Oncotype assay on her. I would have to believe that she would just need an aromatase inhibitor after surgery. She really not too worried about this.  She feels very reassured. I will plan to see her back once we know when her surgery will be.  Lymphedema issues, if any:  Patient denies. Did have shoulder surgery to the right shoulder ~12 years ago   Pain issues, if any:  Patient denies   SAFETY ISSUES: Prior radiation? No Pacemaker/ICD? No Possible current pregnancy? No--postmenopausal  Is the patient on methotrexate? No  Current Complaints / other details:  Waiting to have breast MRI; Scheduled for plastic surgery consultation (Dr. Lyndee Leo Dillingham) 03/08/2021. Had cataract surgery on right eye yesterday, and is scheduled for left eye in ~2 weeks

## 2021-03-05 ENCOUNTER — Encounter: Payer: Self-pay | Admitting: Radiation Oncology

## 2021-03-05 ENCOUNTER — Ambulatory Visit
Admission: RE | Admit: 2021-03-05 | Discharge: 2021-03-05 | Disposition: A | Discharge status: Home / Self Care | Payer: BC Managed Care – PPO | Source: Ambulatory Visit | Attending: Radiation Oncology | Admitting: Radiation Oncology

## 2021-03-05 ENCOUNTER — Encounter: Payer: Self-pay | Admitting: *Deleted

## 2021-03-05 ENCOUNTER — Other Ambulatory Visit: Payer: Self-pay

## 2021-03-05 VITALS — BP 138/70 | HR 70 | Resp 17 | Wt 197.0 lb

## 2021-03-05 DIAGNOSIS — Z79899 Other long term (current) drug therapy: Secondary | ICD-10-CM | POA: Insufficient documentation

## 2021-03-05 DIAGNOSIS — K769 Liver disease, unspecified: Secondary | ICD-10-CM | POA: Insufficient documentation

## 2021-03-05 DIAGNOSIS — G40909 Epilepsy, unspecified, not intractable, without status epilepticus: Secondary | ICD-10-CM | POA: Insufficient documentation

## 2021-03-05 DIAGNOSIS — Z170 Estrogen receptor positive status [ER+]: Secondary | ICD-10-CM | POA: Diagnosis not present

## 2021-03-05 DIAGNOSIS — Z809 Family history of malignant neoplasm, unspecified: Secondary | ICD-10-CM | POA: Diagnosis not present

## 2021-03-05 DIAGNOSIS — E785 Hyperlipidemia, unspecified: Secondary | ICD-10-CM | POA: Diagnosis not present

## 2021-03-05 DIAGNOSIS — K219 Gastro-esophageal reflux disease without esophagitis: Secondary | ICD-10-CM | POA: Diagnosis not present

## 2021-03-05 DIAGNOSIS — I890 Lymphedema, not elsewhere classified: Secondary | ICD-10-CM | POA: Insufficient documentation

## 2021-03-05 DIAGNOSIS — C50211 Malignant neoplasm of upper-inner quadrant of right female breast: Secondary | ICD-10-CM | POA: Insufficient documentation

## 2021-03-05 DIAGNOSIS — F419 Anxiety disorder, unspecified: Secondary | ICD-10-CM | POA: Diagnosis not present

## 2021-03-05 DIAGNOSIS — K589 Irritable bowel syndrome without diarrhea: Secondary | ICD-10-CM | POA: Insufficient documentation

## 2021-03-05 DIAGNOSIS — T8549XA Other mechanical complication of breast prosthesis and implant, initial encounter: Secondary | ICD-10-CM | POA: Diagnosis not present

## 2021-03-05 DIAGNOSIS — J45909 Unspecified asthma, uncomplicated: Secondary | ICD-10-CM | POA: Insufficient documentation

## 2021-03-05 NOTE — Progress Notes (Signed)
MRI delay due to authorization but now scheduled for 03/27/2021.   Oncology Nurse Navigator Documentation  Oncology Nurse Navigator Flowsheets 03/05/2021  Abnormal Finding Date -  Confirmed Diagnosis Date -  Navigator Follow Up Date: 03/27/2021  Navigator Follow Up Reason: Scan Review  Navigator Location [facility]  Navigator Encounter Type Appt/Treatment Plan Review  Patient Visit Type MedOnc  Treatment Phase Pre-Tx/Tx Discussion  Barriers/Navigation Needs Coordination of Care;Education  Education -  Interventions None Required  Acuity Level 2-Minimal Needs (1-2 Barriers Identified)  Coordination of Care -  Education Method -  Support Groups/Services Friends and Family  Time Spent with Patient 15

## 2021-03-08 ENCOUNTER — Ambulatory Visit (INDEPENDENT_AMBULATORY_CARE_PROVIDER_SITE_OTHER): Payer: BC Managed Care – PPO | Admitting: Plastic Surgery

## 2021-03-08 ENCOUNTER — Encounter: Payer: Self-pay | Admitting: Plastic Surgery

## 2021-03-08 ENCOUNTER — Other Ambulatory Visit: Payer: Self-pay

## 2021-03-08 VITALS — BP 133/68 | HR 74 | Ht 64.0 in | Wt 196.0 lb

## 2021-03-08 DIAGNOSIS — R768 Other specified abnormal immunological findings in serum: Secondary | ICD-10-CM

## 2021-03-08 DIAGNOSIS — K589 Irritable bowel syndrome without diarrhea: Secondary | ICD-10-CM

## 2021-03-08 DIAGNOSIS — R7989 Other specified abnormal findings of blood chemistry: Secondary | ICD-10-CM | POA: Diagnosis not present

## 2021-03-08 DIAGNOSIS — C50211 Malignant neoplasm of upper-inner quadrant of right female breast: Secondary | ICD-10-CM

## 2021-03-08 DIAGNOSIS — Z170 Estrogen receptor positive status [ER+]: Secondary | ICD-10-CM

## 2021-03-08 DIAGNOSIS — Z8669 Personal history of other diseases of the nervous system and sense organs: Secondary | ICD-10-CM | POA: Diagnosis not present

## 2021-03-08 DIAGNOSIS — J45901 Unspecified asthma with (acute) exacerbation: Secondary | ICD-10-CM

## 2021-03-08 NOTE — Progress Notes (Signed)
Patient ID: Christie Thomas, female    DOB: 1956/02/12, 65 y.o.   MRN: [id number]   Chief Complaint  Patient presents with   Advice Only    The patient is a 65 yrs old female here with her husband for a consultation for breast reconstruction.  Her recent mammogram showed an abnormality in the right breast 11/11.  The biopsy done on 11/16 showed invasive lobular carcinoma 7 mm in size and was Estrogen positive, Progesterone negative, HER-2 negative. She is seeing Drs. Ennever and [facility].  She had implants placed in 1986.  She is not sure what size they are but states they are saline and are under the muscle.  They were placed through an axillary incision.  She has grade III breast ptosis. She is 5 feet 4 inches tall and weighs 196 pounds.  Her sternal notch to right and left nipple is 28 cm with 11 cm from the nipple to inframammary fold.  She is not a smoker and not on blood thinners.   Her past medical history includes asthma, epilepsy, GERD, hyperlipidemia, IBS, obstructive sleep apnea and arthritis.  Her past surgical history includes breast augmentation, back surgery, cardiac cath, cholecystectomy, appendectomy, shoulder and hand surgery, and c-section. She will likely be on an aromatase inhibitor and get radiation.  She is wanting to have a right partial mastectomy with implant exchange.   Review of Systems  Constitutional: Negative.   HENT: Negative.    Eyes: Negative.   Respiratory: Negative.  Negative for chest tightness and shortness of breath.   Cardiovascular: Negative.  Negative for leg swelling.  Gastrointestinal: Negative.   Endocrine: Negative.   Genitourinary: Negative.   Musculoskeletal: Negative.   Neurological: Negative.   Hematological: Negative.   Psychiatric/Behavioral: Negative.     Past Medical History:  Diagnosis Date   Anxiety 01/20/2016   Asthma    Diverticulitis 01/20/2016   Diverticulosis 01/20/2016   Epilepsy ([place])    Epilepsy ([place]) 01/20/2016    Adolescent    Fatty liver 01/20/2016   GERD (gastroesophageal reflux disease)    Hay fever    Hyperlipidemia    IBS (irritable bowel syndrome) 01/20/2016   Obesity 01/20/2016   OSA (obstructive sleep apnea) 09/07/2017   Osteoarthritis of both hands 01/20/2016   Swelling of ankle joint, left 01/20/2016   Trochanteric bursitis 01/20/2016    Past Surgical History:  Procedure Laterality Date   AUGMENTATION MAMMAPLASTY     BACK SURGERY     x2   BREAST BIOPSY     fatty tumor   BREAST ENHANCEMENT SURGERY     CARDIAC CATHETERIZATION  07/2010   CARPAL TUNNEL RELEASE     CESAREAN SECTION     CHOLECYSTECTOMY     HAND SURGERY     carpal tunnel release, cyst excision   SHOULDER SURGERY     right   TONSILLECTOMY AND ADENOIDECTOMY        Current Outpatient Medications:    albuterol (PROVENTIL HFA;VENTOLIN HFA) 108 (90 Base) MCG/ACT inhaler, Inhale 2 puffs into the lungs every 4 (four) hours as needed., Disp: 1 Inhaler, Rfl: 6   AMBULATORY NON FORMULARY MEDICATION, Ultra flora IB -- once daily, Disp: , Rfl:    Cholecalciferol (VITAMIN D PO), Take by mouth., Disp: , Rfl:    clonazePAM (KLONOPIN) 0.5 MG tablet, TAKE 1 TABLET 3 TIMES DAILY AS NEEDED FOR ANXIETY., Disp: 60 tablet, Rfl: 1   fenofibrate 160 MG tablet, TAKE 1 TABLET ONCE DAILY WITH  FOOD., Disp: 90 tablet, Rfl: 0   fluticasone (FLONASE) 50 MCG/ACT nasal spray, instill 1 to 2 sprays into each nostril daily, Disp: 16 g, Rfl: 3   QVAR 40 MCG/ACT inhaler, inhale 2 puffs INTO THE LUNGS BY MOUTH twice a day, Disp: 8.7 g, Rfl: 6   rosuvastatin (CRESTOR) 10 MG tablet, TAKE 1 TABLET ONCE DAILY., Disp: 90 tablet, Rfl: 0   TURMERIC CURCUMIN PO, Take 500 mg by mouth 2 (two) times daily., Disp: , Rfl:    Objective:   Vitals:   03/08/21 1016  BP: 133/68  Pulse: 74  SpO2: 97%    Physical Exam Vitals and nursing note reviewed.  Constitutional:      Appearance: Normal appearance.  HENT:     Head: Normocephalic and atraumatic.   Cardiovascular:     Rate and Rhythm: Normal rate.  Pulmonary:     Effort: Pulmonary effort is normal. No respiratory distress.     Breath sounds: No wheezing.  Abdominal:     General: There is no distension.     Palpations: Abdomen is soft.     Tenderness: There is no abdominal tenderness.  Musculoskeletal:        General: No swelling or deformity.  Skin:    General: Skin is warm.     Capillary Refill: Capillary refill takes less than 2 seconds.     Coloration: Skin is not jaundiced.     Findings: No bruising.  Neurological:     Mental Status: She is alert and oriented to person, place, and time.  Psychiatric:        Mood and Affect: Mood normal.        Behavior: Behavior normal.        Thought Content: Thought content normal.    Assessment & Plan:  History of epilepsy  Elevated LFTs/ Dr Collene Mares follows   Malignant neoplasm of upper-inner quadrant of right breast in female, estrogen receptor positive ([place])  ANA positive  Irritable bowel syndrome, unspecified type  Asthma with acute exacerbation, unspecified asthma severity, unspecified whether persistent  Breast reconstruction is an optional procedure and eligibility depends on the full spectrum of the health of the patient and any co-morbidities.  More than one surgery is often needed to complete the reconstruction process.  The process can take three to twelve months to complete.  The breasts will not be identical due to many factors such as rib differences, shoulder asymmetry and treatments such as radiation.  The goal is to get the breasts to look normal and symmetrical in clothes.  Scars are a part of surgery and may fade some in time but will always be present under clothes.  Surgery may be an option on the non-cancer breast to achieve more symmetry.  No matter which procedure is chosen there is always the risk of complications and even failure of the body to heal.  This could result in no breast.    The patient would like  to undergo a right partial mastectomy with immediate reconstruction with removal and replacement of implants (slightly smaller) and a mastopexy. This can be done at the same time with the goal of radiation to start ~ 1 month latera.    Total time: 45 minutes. This includes time spent with the patient during the visit as well as time spent before and after the visit reviewing the chart, documenting the encounter, making phone calls and reviewing studies.   The above information was discussed with Dr. Marlou Starks.  The  patient is planning on getting an MRI in the 2 weeks.  We will plan a televisit to be sure the about information is accurate and the plan has remained the same.   Pictures were obtained of the patient and placed in the chart with the patient's or guardian's permission.   Anon Raices, DO

## 2021-03-17 ENCOUNTER — Other Ambulatory Visit: Payer: Self-pay

## 2021-03-17 ENCOUNTER — Ambulatory Visit: Payer: BC Managed Care – PPO | Attending: General Surgery

## 2021-03-17 DIAGNOSIS — Z170 Estrogen receptor positive status [ER+]: Secondary | ICD-10-CM | POA: Diagnosis not present

## 2021-03-17 DIAGNOSIS — C50211 Malignant neoplasm of upper-inner quadrant of right female breast: Secondary | ICD-10-CM | POA: Diagnosis not present

## 2021-03-17 DIAGNOSIS — R293 Abnormal posture: Secondary | ICD-10-CM

## 2021-03-17 NOTE — Patient Instructions (Signed)
Physical Therapy Information for After Breast Cancer Surgery/Treatment:  Lymphedema is a swelling condition that you may be at risk for in your arm if you have lymph nodes removed from the armpit area.  After a sentinel node biopsy, the risk is approximately 5-9% and is higher after an axillary node dissection.  There is treatment available for this condition and it is not life-threatening.  Contact your physician or physical therapist with concerns. You may begin the 4 shoulder/posture exercises (see additional sheet) when permitted by your physician (typically a week after surgery).  If you have drains, you may need to wait until those are removed before beginning range of motion exercises.  A general recommendation is to not lift your arms above shoulder height until drains are removed.  These exercises should be done to your tolerance and gently.  This is not a "no pain/no gain" type of recovery so listen to your body and stretch into the range of motion that you can tolerate, stopping if you have pain.  If you are having immediate reconstruction, ask your plastic surgeon about doing exercises as he or she may want you to wait. We encourage you to attend the free one time ABC (After Breast Cancer) class offered by [facility].  You will learn information related to lymphedema risk, prevention and treatment and additional exercises to regain mobility following surgery.  You can call [phone] for more information.  This is offered the 1st and 3rd Monday of each month.  You only attend the class one time. While undergoing any medical procedure or treatment, try to avoid blood pressure being taken or needle sticks from occurring on the arm on the side of cancer.   This recommendation begins after surgery and continues for the rest of your life.  This may help reduce your risk of getting lymphedema (swelling in your arm). An excellent resource for those seeking information on  lymphedema is the National Lymphedema Network's web site. It can be accessed at [facility] If you notice swelling in your hand, arm or breast at any time following surgery (even if it is many years from now), please contact your doctor or physical therapist to discuss this.  Lymphedema can be treated at any time but it is easier for you if it is treated early on.  If you feel like your shoulder motion is not returning to normal in a reasonable amount of time, please contact your surgeon or physical therapist.  [facility] [place], [place], [place] [phone]; [address], [place], [place] [address] ABC CLASS After Breast Cancer Class  After Breast Cancer Class is a specially designed exercise class to assist you in a safe recover after having breast cancer surgery.  In this class you will learn how to get back to full function whether your drains were just removed or if you had surgery a month ago.  This one-time class is held the 1st and 3rd Monday of every month from 11:00 a.m. until 12:00 noon and is a virtual class/  This class is FREE and space is limited. For more information or to register for the next available class, call [phone].  Class Goals  Understand specific stretches to improve the flexibility of you chest and shoulder. Learn ways to safely strengthen your upper body and improve your posture. Understand the warning signs of infection and why you may be at risk for an arm infection. Learn about Lymphedema and prevention.  ** You do not  attend this class until after surgery.  Drains must be removed to participate  Patient was instructed today in a home exercise program today for post op shoulder range of motion. These included active assist shoulder flexion in sitting/supine, scapular retraction, wall walking with shoulder abduction, and hands behind head external rotation in supine  She was encouraged to do these twice a day, holding 3 seconds and repeating 5 times when  permitted by her physician.

## 2021-03-17 NOTE — Therapy (Signed)
[facility] @ [place], [place], [address] Phone: [phone]   Fax:  [phone]  Physical Therapy Evaluation  Patient Details  Name: Christie Thomas MRN: [id number] Date of Birth: December 16, 1955 Referring Provider (PT): Joaquin Courts Date: 03/17/2021   PT End of Session - 03/17/21 1052     Visit Number 1    Number of Visits 2    Date for PT Re-Evaluation 05/12/21    PT Start Time 8527    PT Stop Time 1050    PT Time Calculation (min) 48 min    Activity Tolerance Patient tolerated treatment well    Behavior During Therapy [facility] for tasks assessed/performed             Past Medical History:  Diagnosis Date   Anxiety 01/20/2016   Asthma    Diverticulitis 01/20/2016   Diverticulosis 01/20/2016   Epilepsy ([place])    Epilepsy ([place]) 01/20/2016   Adolescent    Fatty liver 01/20/2016   GERD (gastroesophageal reflux disease)    Hay fever    Hyperlipidemia    IBS (irritable bowel syndrome) 01/20/2016   Obesity 01/20/2016   OSA (obstructive sleep apnea) 09/07/2017   Osteoarthritis of both hands 01/20/2016   Swelling of ankle joint, left 01/20/2016   Trochanteric bursitis 01/20/2016    Past Surgical History:  Procedure Laterality Date   AUGMENTATION MAMMAPLASTY     BACK SURGERY     x2   BREAST BIOPSY     fatty tumor   BREAST ENHANCEMENT SURGERY     CARDIAC CATHETERIZATION  07/2010   CARPAL TUNNEL RELEASE     CESAREAN SECTION     CHOLECYSTECTOMY     HAND SURGERY     carpal tunnel release, cyst excision   SHOULDER SURGERY     right   TONSILLECTOMY AND ADENOIDECTOMY      There were no vitals filed for this visit.    Subjective Assessment - 03/17/21 1000     Subjective Pt is awaiting a bilateral  breast MRI and then  breast surgery will be scheduled. Will likely have lumpectomy with SLNB. She saw the plastic surgeon last week, and she will likely have implants removed because 1 leaked, and will  replace with smaller implants.    Pertinent History Pt was diagnosed with right breast CA by screening mammogram. Biopsy revealed Invasive lobular carcinoma ER+, PR-, Her 2 - with proliferation marker of 1%. pt has breast implants. Had a prior right shoulder fx 15 yrs ago and motion has been limited since then    Currently in Pain? No/denies    Pain Score 0-No pain                [facility] PT Assessment - 03/17/21 0001       Assessment   Medical Diagnosis Right breast cancer    Referring Provider (PT) Marlou Starks    Onset Date/Surgical Date 02/09/21    Hand Dominance Right      Precautions   Precaution Comments Active CA      Restrictions   Weight Bearing Restrictions No      Balance Screen   Has the patient fallen in the past 6 months No    Has the patient had a decrease in activity level because of a fear of falling?  No    Is the patient reluctant to leave their home because of a fear of falling?  No  Home Environment   Living Environment Private residence    Living Arrangements Spouse/significant other    Available Help at Discharge Family    Type of [place]   10,000 SF hose     Prior Function   Level of Independence Independent      Cognition   Overall Cognitive Status Within Functional Limits for tasks assessed      Posture/Postural Control   Posture/Postural Control Postural limitations    Postural Limitations Rounded Shoulders;Forward head      AROM   Right Shoulder Extension 39 Degrees    Right Shoulder Flexion 153 Degrees    Right Shoulder ABduction 180 Degrees   with crepitus from prior surgery   Right Shoulder External Rotation 68 Degrees      Strength   Overall Strength Comments within functional limits.               LYMPHEDEMA/ONCOLOGY QUESTIONNAIRE - 03/17/21 0001       Type   Cancer Type Invasive Lobular Carcinoma      Surgeries   Lumpectomy Date --   not yet determined     Treatment   Active Chemotherapy Treatment No    Past  Chemotherapy Treatment No    Active Radiation Treatment No    Past Radiation Treatment No    Current Hormone Treatment No    Past Hormone Therapy No      What other symptoms do you have   Are you Having Heaviness or Tightness No    Are you having Pain No    Are you having pitting edema No    Is it Hard or Difficult finding clothes that fit No    Do you have infections No    Is there Decreased scar mobility No      Right Upper Extremity Lymphedema   10 cm Proximal to Olecranon Process 32.2 cm    Olecranon Process 26.7 cm    10 cm Proximal to Ulnar Styloid Process 22.5 cm    Just Proximal to Ulnar Styloid Process 15.5 cm    At Base of 2nd Digit 6 cm      Left Upper Extremity Lymphedema   10 cm Proximal to Olecranon Process 32.5 cm    Olecranon Process 26.5 cm    10 cm Proximal to Ulnar Styloid Process 22 cm    Just Proximal to Ulnar Styloid Process 15.5 cm    At Base of 2nd Digit 6 cm             L-DEX FLOWSHEETS - 03/17/21 1000       L-DEX LYMPHEDEMA SCREENING   Measurement Type Unilateral    L-DEX MEASUREMENT EXTREMITY Upper Extremity    POSITION  Standing    DOMINANT SIDE Right    At Risk Side Right    BASELINE SCORE (UNILATERAL) -2.3                    Objective measurements completed on examination: See above findings.                PT Education - 03/17/21 1052     Education Details ABC class, lymphedema, SOZO screens, 4 post op exercises    Person(s) Educated Patient    Methods Explanation;Handout    Comprehension Verbalized understanding;Returned demonstration                 PT Long Term Goals - 03/17/21 1100       PT LONG  TERM GOAL #1   Title Pts right shoulder ROM will return to pre-surgical level for ROM and function    Time 8    Period Weeks    Status New    Target Date 05/12/21             Breast Clinic Goals - 03/17/21 1100       Patient will be able to verbalize understanding of pertinent lymphedema  risk reduction practices relevant to her diagnosis specifically related to skin care.   Time 1    Period Days    Status Achieved    Target Date 03/17/21      Patient will be able to return demonstrate and/or verbalize understanding of the post-op home exercise program related to regaining shoulder range of motion.   Time 1    Period Days    Status Achieved    Target Date 03/17/21      Patient will be able to verbalize understanding of the importance of attending the postoperative After Breast Cancer Class for further lymphedema risk reduction education and therapeutic exercise.   Time 1    Period Days    Status Achieved    Target Date 03/17/21                   Plan - 03/17/21 1054     Clinical Impression Statement pt was seen for pre-op baselines. Right shoulder ROM was assessed only secondary to previous limitations from several right shoulder injuries/surgeries.  Circumferential measurements were taken and a SOZO screen was done.  Pt was educated in 4 post op exercises to perform after surgery when allowed by MD.  We will set up ABC class when she returns for her post op assessemnt.  Pts husband has had multiple health problems this year .    Personal Factors and Comorbidities Comorbidity 3+    Comorbidities Right breast CA, lprior limitations in right shoulder ROM from fx and surgery,epilepsy, fatty liver,asthma, diverticulitis    Stability/Clinical Decision Making Stable/Uncomplicated    Clinical Decision Making Low    Rehab Potential Excellent    PT Frequency 1x / week   1 visit in 8 weeks   PT Duration 8 weeks    PT Treatment/Interventions Therapeutic exercise;Patient/family education    PT Next Visit Plan reassess post surgery    Recommended Other Services schedule ABC class, 3 month SOZO    Consulted and Agree with Plan of Care Patient             Patient will benefit from skilled therapeutic intervention in order to improve the following deficits and  impairments:  Decreased knowledge of precautions, Postural dysfunction  Visit Diagnosis: Malignant neoplasm of upper-inner quadrant of right breast in female, estrogen receptor positive ([place])  Abnormal posture     Problem List Patient Active Problem List   Diagnosis Date Noted   Breast cancer of upper-inner quadrant of right female breast ([place]) 03/05/2021   Rheumatoid arthritis of multiple sites with negative rheumatoid factor ([place]) 11/17/2016   High risk medication use 11/17/2016   Elevated LFTs/ Dr Collene Mares follows  11/17/2016   Anosmia 08/02/2016   High risk medications (not anticoagulants) long-term use 06/15/2016   ANA positive 01/21/2016   Swelling of ankle joint, left 01/20/2016   Osteoarthritis of both hands 01/20/2016   GERD (gastroesophageal reflux disease) 01/20/2016   Diverticulosis 01/20/2016   Fatty liver 01/20/2016   IBS (irritable bowel syndrome) 01/20/2016   Obesity 01/20/2016   History of  epilepsy 01/20/2016   Cystitis 11/01/2014   Routine general medical examination at a health care facility 05/29/2012   Caregiver stress 01/12/2011   Hyperlipidemia 08/10/2010   Gastroparesis 08/10/2010   Asthma 08/10/2010   Allergic rhinitis 08/10/2010  Patient will follow up at outpatient cancer rehab 3-4 weeks following surgery.  If the patient requires physical therapy at that time, a specific plan will be dictated and sent to the referring physician for approval. The patient was educated today on appropriate basic range of motion exercises to begin post operatively and the importance of attending the After Breast Cancer class following surgery.  Patient was educated today on lymphedema risk reduction practices as it pertains to recommendations that will benefit the patient immediately following surgery.  She verbalized good understanding.   The patient was assessed using the L-Dex machine today to produce a lymphedema index baseline score. The patient will be reassessed on a  regular basis (typically every 3 months) to obtain new L-Dex scores. If the score is > 6.5 points away from his/her baseline score indicating onset of subclinical lymphedema, it will be recommended to wear a compression garment for 4 weeks, 12 hours per day and then be reassessed. If the score continues to be > 6.5 points from baseline at reassessment, we will initiate lymphedema treatment. Assessing in this manner has a 95% rate of preventing clinically significant lymphedema.   Claris Pong, PT 03/17/2021, 12:17 PM  [facility] @ [place], [place], [address] Phone: [phone]   Fax:  [phone]  Name: Christie Thomas MRN: [id number] Date of Birth: Nov 13, 1955

## 2021-03-25 DIAGNOSIS — H2512 Age-related nuclear cataract, left eye: Secondary | ICD-10-CM | POA: Diagnosis not present

## 2021-03-25 DIAGNOSIS — H25812 Combined forms of age-related cataract, left eye: Secondary | ICD-10-CM | POA: Diagnosis not present

## 2021-03-27 ENCOUNTER — Ambulatory Visit
Admission: RE | Admit: 2021-03-27 | Discharge: 2021-03-27 | Disposition: A | Discharge status: Home / Self Care | Payer: BC Managed Care – PPO | Source: Ambulatory Visit | Attending: General Surgery | Admitting: General Surgery

## 2021-03-27 ENCOUNTER — Other Ambulatory Visit: Payer: Self-pay

## 2021-03-27 DIAGNOSIS — C50211 Malignant neoplasm of upper-inner quadrant of right female breast: Secondary | ICD-10-CM

## 2021-03-27 DIAGNOSIS — D0511 Intraductal carcinoma in situ of right breast: Secondary | ICD-10-CM | POA: Diagnosis not present

## 2021-03-27 MED ORDER — GADOBUTROL 1 MMOL/ML IV SOLN
9.0000 mL | Freq: Once | INTRAVENOUS | Status: AC | PRN
  Administered 2021-03-27: 9 mL via INTRAVENOUS

## 2021-03-30 ENCOUNTER — Encounter: Payer: Self-pay | Admitting: *Deleted

## 2021-03-30 ENCOUNTER — Telehealth (INDEPENDENT_AMBULATORY_CARE_PROVIDER_SITE_OTHER): Payer: BC Managed Care – PPO | Admitting: Plastic Surgery

## 2021-03-30 ENCOUNTER — Encounter: Payer: Self-pay | Admitting: Plastic Surgery

## 2021-03-30 ENCOUNTER — Other Ambulatory Visit: Payer: Self-pay

## 2021-03-30 DIAGNOSIS — Z170 Estrogen receptor positive status [ER+]: Secondary | ICD-10-CM

## 2021-03-30 DIAGNOSIS — C50211 Malignant neoplasm of upper-inner quadrant of right female breast: Secondary | ICD-10-CM

## 2021-03-30 NOTE — Progress Notes (Signed)
Oncology Nurse Navigator Documentation  Oncology Nurse Navigator Flowsheets 03/30/2021  Abnormal Finding Date -  Confirmed Diagnosis Date -  Navigator Follow Up Date: 04/02/2021  Navigator Follow Up Reason: Appointment Review  Navigator Location [facility]  Navigator Encounter Type Scan Review  Patient Visit Type MedOnc  Treatment Phase Pre-Tx/Tx Discussion  Barriers/Navigation Needs Coordination of Care;Education  Education -  Interventions None Required  Acuity Level 2-Minimal Needs (1-2 Barriers Identified)  Coordination of Care -  Education Method -  Support Groups/Services Friends and Family  Time Spent with Patient 15

## 2021-03-30 NOTE — Progress Notes (Addendum)
° °  Subjective:  ° ° Patient ID: Christie Thomas, female    DOB: 06/30/1955, 65 y.o.   MRN: [id number] ° °The patient is a 65-year-old female joining me by the computer for a televisit.  We are further discussing her breast reconstruction options.  She had a abnormal mammogram in November and the biopsy showed invasive lobular carcinoma which was estrogen positive progesterone negative and HER2 negative.  She is seeing Dr. Ennever and Dr. Toth.  She had breast implants placed in 1986.  We are unsure of the size but think that they are saline and submuscular.  On her exam from her visit she had grade 3 ptosis.  She is 5 feet 4 inches tall and weighs 196 pounds.  Her sternal notch to right and left nipple was 28 cm.  She is is not a smoker and not on blood thinners. °Her past medical history is positive for asthma, epilepsy, GERD, hyperlipidemia, irritable bowel syndrome, obstructive sleep apnea and arthritis.   °She has had a breast augmentation, back surgery, cholecystectomy, and appendectomy, shoulder and hand surgery and a C-section.  She is still hoping for a partial mastectomy with exchange of her implants.  She had her MRI done yesterday and the results are in the chart which I reviewed.  ° ° °Review of Systems  °Constitutional: Negative.   °Eyes: Negative.   °Respiratory: Negative.    °Cardiovascular: Negative.   °Gastrointestinal: Negative.   °Endocrine: Negative.   °Genitourinary: Negative.   ° °   °Objective:  ° Physical Exam ° °   °Assessment & Plan:  ° °  ICD-10-CM   °1. Malignant neoplasm of upper-inner quadrant of right breast in female, estrogen receptor positive ([facility])  C50.211   ° Z17.0   °  °  °I connected with  Christie Thomas on 03/30/21 by a video enabled telemedicine application and verified that I am speaking with the correct person using two identifiers.  The patient was at home and I was at the office.  I spent 10 minutes in the following manner: Review of the chart, review of the  MRI, discussion with Dr. Toth, discussion with the patient and documentation. ° °She would like to move ahead with bilateral oncoplastic breast reduction. °  °I discussed the limitations of evaluation and management by telemedicine. The patient expressed understanding and agreed to proceed. ° °I spoke with the patient again 1/12 to clarify:  She wants to go with removal of the implants and replacement with smaller implants and silicone. °

## 2021-04-01 ENCOUNTER — Ambulatory Visit: Payer: Self-pay | Admitting: General Surgery

## 2021-04-01 DIAGNOSIS — C50211 Malignant neoplasm of upper-inner quadrant of right female breast: Secondary | ICD-10-CM

## 2021-04-02 ENCOUNTER — Encounter: Payer: Self-pay | Admitting: *Deleted

## 2021-04-02 NOTE — Progress Notes (Signed)
Patient still not scheduled for surgery. Reviewing notes, Dr Marlou Starks placed surgical orders yesterday. Will follow for surgical date.   Oncology Nurse Navigator Documentation  Oncology Nurse Navigator Flowsheets 04/02/2021  Abnormal Finding Date -  Confirmed Diagnosis Date -  Navigator Follow Up Date: 04/06/2021  Navigator Follow Up Reason: Appointment Review  Navigator Location [facility]  Navigator Encounter Type Appt/Treatment Plan Review  Patient Visit Type MedOnc  Treatment Phase Pre-Tx/Tx Discussion  Barriers/Navigation Needs Coordination of Care;Education  Education -  Interventions None Required  Acuity Level 2-Minimal Needs (1-2 Barriers Identified)  Coordination of Care -  Education Method -  Support Groups/Services Friends and Family  Time Spent with Patient 15

## 2021-04-03 ENCOUNTER — Other Ambulatory Visit: Payer: Self-pay | Admitting: Family Medicine

## 2021-04-03 DIAGNOSIS — E785 Hyperlipidemia, unspecified: Secondary | ICD-10-CM

## 2021-04-06 ENCOUNTER — Other Ambulatory Visit: Payer: Self-pay | Admitting: General Surgery

## 2021-04-06 ENCOUNTER — Encounter: Payer: Self-pay | Admitting: *Deleted

## 2021-04-06 DIAGNOSIS — C50211 Malignant neoplasm of upper-inner quadrant of right female breast: Secondary | ICD-10-CM

## 2021-04-06 NOTE — Progress Notes (Signed)
Patient has still not been scheduled for surgery. Reached out to surgery scheduler at [facility]. They have been coordinating with Dr Eusebio Friendly office.   Patient scheduled on 05/17/2021 for R lumpectomy with SLNB in additional to implant revision.   Oncology Nurse Navigator Documentation  Oncology Nurse Navigator Flowsheets 04/06/2021  Abnormal Finding Date -  Confirmed Diagnosis Date -  Planned Course of Treatment Surgery  Phase of Treatment Surgery  Expected Surgery Date 05/17/2021  Surgery Pending- Reason: Surgeon or Oncologist Initiated  Navigator Follow Up Date: 05/17/2021  Navigator Follow Up Reason: Surgery  Navigator Location [facility]  Navigator Encounter Type Appt/Treatment Plan Review  Patient Visit Type MedOnc  Treatment Phase Pre-Tx/Tx Discussion  Barriers/Navigation Needs Coordination of Care;Education  Education -  Interventions Coordination of Care  Acuity Level 2-Minimal Needs (1-2 Barriers Identified)  Coordination of Care Other  Education Method -  Support Groups/Services Friends and Family  Time Spent with Patient 15

## 2021-05-03 NOTE — Progress Notes (Signed)
Patient ID: Christie Thomas, female    DOB: Aug 24, 1955, 66 y.o.   MRN: [id number]  Chief Complaint  Patient presents with   Pre-op Exam      ICD-10-CM   1. Malignant neoplasm of upper-inner quadrant of right breast in female, estrogen receptor positive ([place])  C50.211    Z17.0        History of Present Illness: Christie Thomas is a 66 y.o.  female  with a history of invasive lobular carcinoma, right breast.  She presents for preoperative evaluation for upcoming procedure, right breast lumpectomy with radioactive seed and sentinel node biopsy as well as bilateral oncoplastic reduction with bilateral implant removal and replacement, scheduled for 05/17/2021 with Dr. Marla Roe.  The patient has not had problems with anesthesia.  Patient denies any personal or family history of blood clots or clotting disorder.  She is actively being followed by oncology for right-sided invasive breast cancer.  She is no longer on HRT.  She has not required any antiseizure medication in decades.  She is currently a 40 D cup and would like to be a B or C cup.  She does endorse scattered varicosities.  Allergies to shellfish, PCN, and cephalosporins including Keflex.  While she has a history of OSA, she has not required CPAP since 30 pound weight loss years ago.  She occasionally will take an aspirin for headaches related to her recent cataract surgery, but will not take any 7 days prior to surgery.  She will take Tylenol or ibuprofen instead.  She has an excellent support system at home.  Summary of Previous Visit: Patient was seen most recently by Dr. Marla Roe on 03/30/2021.  At that time, patient confirms that she had saline submuscular implants placed in 1986.  She reports multiple surgeries in the past.  Patient hopes for partial mastectomy/bilateral oncoplastic breast reduction along with implant exchange.  Job: Works at home, no FMLA required.  PMH Significant for: Invasive lobular carcinoma  right breast, prior breast augmentation, asthma, epilepsy, HLD, GERD.    Past Medical History: Allergies: Allergies  Allergen Reactions   Iodine Anaphylaxis   Penicillins     Throat swelling    Shellfish Allergy Anaphylaxis   Cephalexin     Other reaction(s): Unknown   Ciprofloxacin     Other reaction(s): Unknown   Metoclopramide Other (See Comments)    Current Medications:  Current Outpatient Medications:    albuterol (PROVENTIL HFA;VENTOLIN HFA) 108 (90 Base) MCG/ACT inhaler, Inhale 2 puffs into the lungs every 4 (four) hours as needed., Disp: 1 Inhaler, Rfl: 6   AMBULATORY NON FORMULARY MEDICATION, Ultra flora IB -- once daily, Disp: , Rfl:    Cholecalciferol (VITAMIN D PO), Take by mouth., Disp: , Rfl:    clonazePAM (KLONOPIN) 0.5 MG tablet, TAKE 1 TABLET 3 TIMES DAILY AS NEEDED FOR ANXIETY., Disp: 60 tablet, Rfl: 1   fenofibrate 160 MG tablet, TAKE 1 TABLET ONCE DAILY WITH FOOD., Disp: 90 tablet, Rfl: 0   fluticasone (FLONASE) 50 MCG/ACT nasal spray, instill 1 to 2 sprays into each nostril daily, Disp: 16 g, Rfl: 3   rosuvastatin (CRESTOR) 10 MG tablet, TAKE 1 TABLET ONCE DAILY., Disp: 90 tablet, Rfl: 0   TURMERIC CURCUMIN PO, Take 500 mg by mouth 2 (two) times daily., Disp: , Rfl:    QVAR 40 MCG/ACT inhaler, inhale 2 puffs INTO THE LUNGS BY MOUTH twice a day, Disp: 8.7 g, Rfl: 6  Past Medical Problems: Past Medical History:  Diagnosis Date   Anxiety 01/20/2016   Asthma    Diverticulitis 01/20/2016   Diverticulosis 01/20/2016   Epilepsy ([place])    Epilepsy ([place]) 01/20/2016   Adolescent    Fatty liver 01/20/2016   GERD (gastroesophageal reflux disease)    Hay fever    Hyperlipidemia    IBS (irritable bowel syndrome) 01/20/2016   Obesity 01/20/2016   OSA (obstructive sleep apnea) 09/07/2017   Osteoarthritis of both hands 01/20/2016   Swelling of ankle joint, left 01/20/2016   Trochanteric bursitis 01/20/2016    Past Surgical History: Past Surgical History:  Procedure  Laterality Date   AUGMENTATION MAMMAPLASTY     BACK SURGERY     x2   BREAST BIOPSY     fatty tumor   BREAST ENHANCEMENT SURGERY     CARDIAC CATHETERIZATION  07/2010   CARPAL TUNNEL RELEASE     CESAREAN SECTION     CHOLECYSTECTOMY     HAND SURGERY     carpal tunnel release, cyst excision   SHOULDER SURGERY     right   TONSILLECTOMY AND ADENOIDECTOMY      Social History: Social History   Socioeconomic History   Marital status: Married    Spouse name: Not on file   Number of children: Not on file   Years of education: Not on file   Highest education level: Not on file  Occupational History   Not on file  Tobacco Use   Smoking status: Never   Smokeless tobacco: Never  Vaping Use   Vaping Use: Never used  Substance and Sexual Activity   Alcohol use: No   Drug use: No   Sexual activity: Not Currently    Birth control/protection: Post-menopausal  Other Topics Concern   Not on file  Social History Narrative   Not on file   Social Determinants of Health   Financial Resource Strain: Not on file  Food Insecurity: Not on file  Transportation Needs: Not on file  Physical Activity: Not on file  Stress: Not on file  Social Connections: Not on file  Intimate Partner Violence: Not on file    Family History: Family History  Problem Relation Age of Onset   Stroke Mother    Hypertension Mother    Hyperlipidemia Mother    Diabetes Mother    Cancer Mother    Sudden death Paternal Grandfather 26   Alzheimer's disease Father    Heart attack Neg Hx     Review of Systems: ROS Denies recent fevers, chills, infection, chest pain, or difficulty breathing.  Physical Exam: Vital Signs BP (!) 163/83 (BP Location: Left Arm, Patient Position: Sitting, Cuff Size: Large)    Pulse 88    Ht 5' 4.5" (1.638 m)    Wt 193 lb 6.4 oz (87.7 kg)    SpO2 97%    BMI 32.68 kg/m   Physical Exam Constitutional:      General: Not in acute distress.    Appearance: Normal appearance. Not  ill-appearing.  HENT:     Head: Normocephalic and atraumatic.  Eyes:     Pupils: Pupils are equal, round. Cardiovascular:     Rate and Rhythm: Normal rate.    Pulses: Normal pulses.  Pulmonary:     Effort: No respiratory distress or increased work of breathing.  Speaks in full sentences. Abdominal:     General: Abdomen is flat. No distension.   Musculoskeletal: Normal range of motion. No lower extremity swelling or edema.  Scattered varicosities. Skin:  General: Skin is warm and dry.     Findings: No erythema or rash.  Neurological:     Mental Status: Alert and oriented to person, place, and time.  Psychiatric:        Mood and Affect: Mood normal.        Behavior: Behavior normal.    Assessment/Plan: The patient is scheduled for right breast lumpectomy with radioactive seed and sentinel node biopsy as well as bilateral oncoplastic reduction with bilateral implant removal and replacement with Dr. Marla Roe.  Risks, benefits, and alternatives of procedure discussed, questions answered and consent obtained.    Smoking Status: Non-smoker. Last Mammogram: 03/27/2021; Results: Known malignancy right breast 1.4 cm upper inner quadrant.  Caprini Score: 8; Risk Factors include: Age, BMI greater than 25, known malignancy, scattered varicosities, and length of planned surgery. Recommendation for mechanical and possibly pharmacological prophylaxis. Encourage early ambulation.  Defer to general surgery for possible Lovenox.  Pictures obtained: 03/08/2021  Post-op Rx sent to pharmacy: Tramadol, doxycycline, Zofran.  Patient was provided with the General Surgical Risk consent document and Pain Medication Agreement prior to their appointment.  They had adequate time to read through the risk consent documents and Pain Medication Agreement. We also discussed them in person together during this preop appointment. All of their questions were answered to their satisfaction.  Recommended calling if  they have any further questions.  Risk consent form and Pain Medication Agreement to be scanned into patient's chart.  The risk that can be encountered with breast reduction were discussed and include the following but not limited to these:  Breast asymmetry, fluid accumulation, firmness of the breast, inability to breast feed, loss of nipple or areola, skin loss, decrease or no nipple sensation, fat necrosis of the breast tissue, bleeding, infection, healing delay.  There are risks of anesthesia, changes to skin sensation and injury to nerves or blood vessels.  The muscle can be temporarily or permanently injured.  You may have an allergic reaction to tape, suture, glue, blood products which can result in skin discoloration, swelling, pain, skin lesions, poor healing.  Any of these can lead to the need for revisonal surgery or stage procedures.  A reduction has potential to interfere with diagnostic procedures.  Nipple or breast piercing can increase risks of infection.  This procedure is best done when the breast is fully developed.  Changes in the breast will continue to occur over time.  Pregnancy can alter the outcomes of previous breast reduction surgery, weight gain and weigh loss can also effect the long term appearance.   The risks that can be encountered with and after placement of a breast implant placement were discussed and include the following but not limited to these: bleeding, infection, delayed healing, anesthesia risks, skin sensation changes, injury to structures including nerves, blood vessels, and muscles which may be temporary or permanent, allergies to tape, suture materials and glues, blood products, topical preparations or injected agents, skin contour irregularities, skin discoloration and swelling, deep vein thrombosis, cardiac and pulmonary complications, pain, which may persist, fluid accumulation, wrinkling of the skin over the implant, changes in nipple or breast sensation,  implant leakage or rupture, faulty position of the implant, persistent pain, formation of tight scar tissue around the implant (capsular contracture), possible need for revisional surgery or staged procedures.    Electronically signed by: Krista Blue, PA-C 05/05/2021 9:13 AM

## 2021-05-03 NOTE — H&P (View-Only) (Signed)
Patient ID: Christie Thomas, female    DOB: 1955/05/05, 66 y.o.   MRN: [id number]  Chief Complaint  Patient presents with   Pre-op Exam      ICD-10-CM   1. Malignant neoplasm of upper-inner quadrant of right breast in female, estrogen receptor positive ([place])  C50.211    Z17.0        History of Present Illness: Christie Thomas is a 66 y.o.  female  with a history of invasive lobular carcinoma, right breast.  She presents for preoperative evaluation for upcoming procedure, right breast lumpectomy with radioactive seed and sentinel node biopsy as well as bilateral oncoplastic reduction with bilateral implant removal and replacement, scheduled for 05/17/2021 with Dr. Marla Roe.  The patient has not had problems with anesthesia.  Patient denies any personal or family history of blood clots or clotting disorder.  She is actively being followed by oncology for right-sided invasive breast cancer.  She is no longer on HRT.  She has not required any antiseizure medication in decades.  She is currently a 40 D cup and would like to be a B or C cup.  She does endorse scattered varicosities.  Allergies to shellfish, PCN, and cephalosporins including Keflex.  While she has a history of OSA, she has not required CPAP since 30 pound weight loss years ago.  She occasionally will take an aspirin for headaches related to her recent cataract surgery, but will not take any 7 days prior to surgery.  She will take Tylenol or ibuprofen instead.  She has an excellent support system at home.  Summary of Previous Visit: Patient was seen most recently by Dr. Marla Roe on 03/30/2021.  At that time, patient confirms that she had saline submuscular implants placed in 1986.  She reports multiple surgeries in the past.  Patient hopes for partial mastectomy/bilateral oncoplastic breast reduction along with implant exchange.  Job: Works at home, no FMLA required.  PMH Significant for: Invasive lobular carcinoma  right breast, prior breast augmentation, asthma, epilepsy, HLD, GERD.    Past Medical History: Allergies: Allergies  Allergen Reactions   Iodine Anaphylaxis   Penicillins     Throat swelling    Shellfish Allergy Anaphylaxis   Cephalexin     Other reaction(s): Unknown   Ciprofloxacin     Other reaction(s): Unknown   Metoclopramide Other (See Comments)    Current Medications:  Current Outpatient Medications:    albuterol (PROVENTIL HFA;VENTOLIN HFA) 108 (90 Base) MCG/ACT inhaler, Inhale 2 puffs into the lungs every 4 (four) hours as needed., Disp: 1 Inhaler, Rfl: 6   AMBULATORY NON FORMULARY MEDICATION, Ultra flora IB -- once daily, Disp: , Rfl:    Cholecalciferol (VITAMIN D PO), Take by mouth., Disp: , Rfl:    clonazePAM (KLONOPIN) 0.5 MG tablet, TAKE 1 TABLET 3 TIMES DAILY AS NEEDED FOR ANXIETY., Disp: 60 tablet, Rfl: 1   fenofibrate 160 MG tablet, TAKE 1 TABLET ONCE DAILY WITH FOOD., Disp: 90 tablet, Rfl: 0   fluticasone (FLONASE) 50 MCG/ACT nasal spray, instill 1 to 2 sprays into each nostril daily, Disp: 16 g, Rfl: 3   rosuvastatin (CRESTOR) 10 MG tablet, TAKE 1 TABLET ONCE DAILY., Disp: 90 tablet, Rfl: 0   TURMERIC CURCUMIN PO, Take 500 mg by mouth 2 (two) times daily., Disp: , Rfl:    QVAR 40 MCG/ACT inhaler, inhale 2 puffs INTO THE LUNGS BY MOUTH twice a day, Disp: 8.7 g, Rfl: 6  Past Medical Problems: Past Medical History:  Diagnosis Date   Anxiety 01/20/2016   Asthma    Diverticulitis 01/20/2016   Diverticulosis 01/20/2016   Epilepsy ([place])    Epilepsy ([place]) 01/20/2016   Adolescent    Fatty liver 01/20/2016   GERD (gastroesophageal reflux disease)    Hay fever    Hyperlipidemia    IBS (irritable bowel syndrome) 01/20/2016   Obesity 01/20/2016   OSA (obstructive sleep apnea) 09/07/2017   Osteoarthritis of both hands 01/20/2016   Swelling of ankle joint, left 01/20/2016   Trochanteric bursitis 01/20/2016    Past Surgical History: Past Surgical History:  Procedure  Laterality Date   AUGMENTATION MAMMAPLASTY     BACK SURGERY     x2   BREAST BIOPSY     fatty tumor   BREAST ENHANCEMENT SURGERY     CARDIAC CATHETERIZATION  07/2010   CARPAL TUNNEL RELEASE     CESAREAN SECTION     CHOLECYSTECTOMY     HAND SURGERY     carpal tunnel release, cyst excision   SHOULDER SURGERY     right   TONSILLECTOMY AND ADENOIDECTOMY      Social History: Social History   Socioeconomic History   Marital status: Married    Spouse name: Not on file   Number of children: Not on file   Years of education: Not on file   Highest education level: Not on file  Occupational History   Not on file  Tobacco Use   Smoking status: Never   Smokeless tobacco: Never  Vaping Use   Vaping Use: Never used  Substance and Sexual Activity   Alcohol use: No   Drug use: No   Sexual activity: Not Currently    Birth control/protection: Post-menopausal  Other Topics Concern   Not on file  Social History Narrative   Not on file   Social Determinants of Health   Financial Resource Strain: Not on file  Food Insecurity: Not on file  Transportation Needs: Not on file  Physical Activity: Not on file  Stress: Not on file  Social Connections: Not on file  Intimate Partner Violence: Not on file    Family History: Family History  Problem Relation Age of Onset   Stroke Mother    Hypertension Mother    Hyperlipidemia Mother    Diabetes Mother    Cancer Mother    Sudden death Paternal Grandfather 51   Alzheimer's disease Father    Heart attack Neg Hx     Review of Systems: ROS Denies recent fevers, chills, infection, chest pain, or difficulty breathing.  Physical Exam: Vital Signs BP (!) 163/83 (BP Location: Left Arm, Patient Position: Sitting, Cuff Size: Large)    Pulse 88    Ht 5' 4.5" (1.638 m)    Wt 193 lb 6.4 oz (87.7 kg)    SpO2 97%    BMI 32.68 kg/m   Physical Exam Constitutional:      General: Not in acute distress.    Appearance: Normal appearance. Not  ill-appearing.  HENT:     Head: Normocephalic and atraumatic.  Eyes:     Pupils: Pupils are equal, round. Cardiovascular:     Rate and Rhythm: Normal rate.    Pulses: Normal pulses.  Pulmonary:     Effort: No respiratory distress or increased work of breathing.  Speaks in full sentences. Abdominal:     General: Abdomen is flat. No distension.   Musculoskeletal: Normal range of motion. No lower extremity swelling or edema.  Scattered varicosities. Skin:  General: Skin is warm and dry.     Findings: No erythema or rash.  Neurological:     Mental Status: Alert and oriented to person, place, and time.  Psychiatric:        Mood and Affect: Mood normal.        Behavior: Behavior normal.    Assessment/Plan: The patient is scheduled for right breast lumpectomy with radioactive seed and sentinel node biopsy as well as bilateral oncoplastic reduction with bilateral implant removal and replacement with Dr. Marla Roe.  Risks, benefits, and alternatives of procedure discussed, questions answered and consent obtained.    Smoking Status: Non-smoker. Last Mammogram: 03/27/2021; Results: Known malignancy right breast 1.4 cm upper inner quadrant.  Caprini Score: 8; Risk Factors include: Age, BMI greater than 25, known malignancy, scattered varicosities, and length of planned surgery. Recommendation for mechanical and possibly pharmacological prophylaxis. Encourage early ambulation.  Defer to general surgery for possible Lovenox.  Pictures obtained: 03/08/2021  Post-op Rx sent to pharmacy: Tramadol, doxycycline, Zofran.  Patient was provided with the General Surgical Risk consent document and Pain Medication Agreement prior to their appointment.  They had adequate time to read through the risk consent documents and Pain Medication Agreement. We also discussed them in person together during this preop appointment. All of their questions were answered to their satisfaction.  Recommended calling if  they have any further questions.  Risk consent form and Pain Medication Agreement to be scanned into patient's chart.  The risk that can be encountered with breast reduction were discussed and include the following but not limited to these:  Breast asymmetry, fluid accumulation, firmness of the breast, inability to breast feed, loss of nipple or areola, skin loss, decrease or no nipple sensation, fat necrosis of the breast tissue, bleeding, infection, healing delay.  There are risks of anesthesia, changes to skin sensation and injury to nerves or blood vessels.  The muscle can be temporarily or permanently injured.  You may have an allergic reaction to tape, suture, glue, blood products which can result in skin discoloration, swelling, pain, skin lesions, poor healing.  Any of these can lead to the need for revisonal surgery or stage procedures.  A reduction has potential to interfere with diagnostic procedures.  Nipple or breast piercing can increase risks of infection.  This procedure is best done when the breast is fully developed.  Changes in the breast will continue to occur over time.  Pregnancy can alter the outcomes of previous breast reduction surgery, weight gain and weigh loss can also effect the long term appearance.   The risks that can be encountered with and after placement of a breast implant placement were discussed and include the following but not limited to these: bleeding, infection, delayed healing, anesthesia risks, skin sensation changes, injury to structures including nerves, blood vessels, and muscles which may be temporary or permanent, allergies to tape, suture materials and glues, blood products, topical preparations or injected agents, skin contour irregularities, skin discoloration and swelling, deep vein thrombosis, cardiac and pulmonary complications, pain, which may persist, fluid accumulation, wrinkling of the skin over the implant, changes in nipple or breast sensation,  implant leakage or rupture, faulty position of the implant, persistent pain, formation of tight scar tissue around the implant (capsular contracture), possible need for revisional surgery or staged procedures.    Electronically signed by: Krista Blue, PA-C 05/05/2021 9:13 AM

## 2021-05-05 ENCOUNTER — Other Ambulatory Visit: Payer: Self-pay

## 2021-05-05 ENCOUNTER — Ambulatory Visit (INDEPENDENT_AMBULATORY_CARE_PROVIDER_SITE_OTHER): Payer: BC Managed Care – PPO | Admitting: Physician Assistant

## 2021-05-05 VITALS — BP 163/83 | HR 88 | Ht 64.5 in | Wt 193.4 lb

## 2021-05-05 DIAGNOSIS — Z170 Estrogen receptor positive status [ER+]: Secondary | ICD-10-CM

## 2021-05-05 DIAGNOSIS — C50211 Malignant neoplasm of upper-inner quadrant of right female breast: Secondary | ICD-10-CM

## 2021-05-05 MED ORDER — TRAMADOL HCL 50 MG PO TABS: 50.0000 mg | ORAL_TABLET | Freq: Three times a day (TID) | ORAL | 0 refills | Status: AC | PRN

## 2021-05-05 MED ORDER — ONDANSETRON 4 MG PO TBDP: 4.0000 mg | ORAL_TABLET | Freq: Three times a day (TID) | ORAL | 0 refills | Status: DC | PRN

## 2021-05-05 MED ORDER — DOXYCYCLINE HYCLATE 100 MG PO TABS: 100.0000 mg | ORAL_TABLET | Freq: Two times a day (BID) | ORAL | 0 refills | Status: AC

## 2021-05-07 NOTE — Progress Notes (Signed)
Surgical Instructions    Your procedure is scheduled on Monday, February 27th, 2023.   Report to [facility] Main Entrance "A" at 06:30 A.M., then check in with the Admitting office.  Call this number if you have problems the morning of surgery:  [phone]   If you have any questions prior to your surgery date call [phone]: Open Monday-Friday 8am-4pm    Remember:  Do not eat after midnight the night before your surgery  You may drink clear liquids until 05:30 the morning of your surgery.   Clear liquids allowed are: Water, Non-Citrus Juices (without pulp), Carbonated Beverages, Clear Tea, Black Coffee ONLY (NO MILK, CREAM OR POWDERED CREAMER of any kind), and Gatorade    Take these medicines the morning of surgery with A SIP OF WATER:   doxycycline (VIBRA-TABS)  fluticasone (FLONASE)  QVAR rosuvastatin (CRESTOR)   If needed:  albuterol (PROVENTIL HFA;VENTOLIN HFA)  clonazePAM (KLONOPIN) ondansetron (ZOFRAN-ODT)  traMADol (ULTRAM)   Please bring all inhalers with you the day of surgery.    As of today, STOP taking any Aspirin (unless otherwise instructed by your surgeon) Aleve, Naproxen, Ibuprofen, Motrin, Advil, Goody's, BC's, all herbal medications, fish oil, and all vitamins.   The day of surgery:          Do not wear jewelry or makeup Do not wear lotions, powders, perfumes, or deodorant. Do not shave 48 hours prior to surgery.   Do not bring valuables to the hospital. Do not wear nail polish, gel polish, artificial nails, or any other type of covering on natural nails (fingers and toes) If you have artificial nails or gel coating that need to be removed by a nail salon, please have this removed prior to surgery. Artificial nails or gel coating may interfere with anesthesia's ability to adequately monitor your vital signs.   [facility] is not responsible for any belongings or valuables. .   Do NOT Smoke (Tobacco/Vaping)  24 hours prior to your  procedure  If you use a CPAP at night, you may bring your mask for your overnight stay.   Contacts, glasses, hearing aids, dentures or partials may not be worn into surgery, please bring cases for these belongings   For patients admitted to the hospital, discharge time will be determined by your treatment team.   Patients discharged the day of surgery will not be allowed to drive home, and someone needs to stay with them for 24 hours.  NO VISITORS WILL BE ALLOWED IN PRE-OP WHERE PATIENTS ARE PREPPED FOR SURGERY.  ONLY 1 SUPPORT PERSON MAY BE PRESENT IN THE WAITING ROOM WHILE YOU ARE IN SURGERY.  IF YOU ARE TO BE ADMITTED, ONCE YOU ARE IN YOUR ROOM YOU WILL BE ALLOWED TWO (2) VISITORS. 1 (ONE) VISITOR MAY STAY OVERNIGHT BUT MUST ARRIVE TO THE ROOM BY 8pm.  Minor children may have two parents present. Special consideration for safety and communication needs will be reviewed on a case by case basis.  Special instructions:    Oral Hygiene is also important to reduce your risk of infection.  Remember - BRUSH YOUR TEETH THE MORNING OF SURGERY WITH YOUR REGULAR TOOTHPASTE   [place]- Preparing For Surgery  Before surgery, you can play an important role. Because skin is not sterile, your skin needs to be as free of germs as possible. You can reduce the number of germs on your skin by washing with CHG (chlorahexidine gluconate) Soap before surgery.  CHG is an antiseptic cleaner which kills  germs and bonds with the skin to continue killing germs even after washing.     Please do not use if you have an allergy to CHG or antibacterial soaps. If your skin becomes reddened/irritated stop using the CHG.  Do not shave (including legs and underarms) for at least 48 hours prior to first CHG shower. It is OK to shave your face.  Please follow these instructions carefully.     Shower the NIGHT BEFORE SURGERY and the MORNING OF SURGERY with CHG Soap.   If you chose to wash your hair, wash your hair first  as usual with your normal shampoo. After you shampoo, rinse your hair and body thoroughly to remove the shampoo.  Then [employer] and genitals (private parts) with your normal soap and rinse thoroughly to remove soap.  After that Use CHG Soap as you would any other liquid soap. You can apply CHG directly to the skin and wash gently with a scrungie or a clean washcloth.   Apply the CHG Soap to your body ONLY FROM THE NECK DOWN.  Do not use on open wounds or open sores. Avoid contact with your eyes, ears, mouth and genitals (private parts). Wash Face and genitals (private parts)  with your normal soap.   Wash thoroughly, paying special attention to the area where your surgery will be performed.  Thoroughly rinse your body with warm water from the neck down.  DO NOT shower/wash with your normal soap after using and rinsing off the CHG Soap.  Pat yourself dry with a CLEAN TOWEL.  Wear CLEAN PAJAMAS to bed the night before surgery  Place CLEAN SHEETS on your bed the night before your surgery  DO NOT SLEEP WITH PETS.   Day of Surgery:  Take a shower with CHG soap. Wear Clean/Comfortable clothing the morning of surgery Do not apply any deodorants/lotions.   Remember to brush your teeth WITH YOUR REGULAR TOOTHPASTE.    Please read over the following fact sheets that you were given.

## 2021-05-10 ENCOUNTER — Encounter (HOSPITAL_COMMUNITY): Payer: Self-pay

## 2021-05-10 ENCOUNTER — Encounter (HOSPITAL_COMMUNITY)
Admission: RE | Admit: 2021-05-10 | Discharge: 2021-05-10 | Disposition: A | Discharge status: Home / Self Care | Payer: BC Managed Care – PPO | Source: Ambulatory Visit | Attending: General Surgery | Admitting: General Surgery

## 2021-05-10 ENCOUNTER — Other Ambulatory Visit: Payer: Self-pay

## 2021-05-10 VITALS — BP 141/78 | HR 76 | Temp 98.4°F | Resp 17 | Ht 64.0 in | Wt 193.0 lb

## 2021-05-10 DIAGNOSIS — Z01812 Encounter for preprocedural laboratory examination: Secondary | ICD-10-CM | POA: Insufficient documentation

## 2021-05-10 DIAGNOSIS — K769 Liver disease, unspecified: Secondary | ICD-10-CM | POA: Diagnosis not present

## 2021-05-10 DIAGNOSIS — Z01818 Encounter for other preprocedural examination: Secondary | ICD-10-CM

## 2021-05-10 HISTORY — DX: Other specified postprocedural states: R11.2

## 2021-05-10 HISTORY — DX: Other specified postprocedural states: Z98.890

## 2021-05-10 LAB — COMPREHENSIVE METABOLIC PANEL
ALT: 95 U/L — ABNORMAL HIGH (ref 0–44)
AST: 97 U/L — ABNORMAL HIGH (ref 15–41)
Albumin: 4.4 g/dL (ref 3.5–5.0)
Alkaline Phosphatase: 78 U/L (ref 38–126)
Anion gap: 9 (ref 5–15)
BUN: 16 mg/dL (ref 8–23)
CO2: 23 mmol/L (ref 22–32)
Calcium: 10.2 mg/dL (ref 8.9–10.3)
Chloride: 106 mmol/L (ref 98–111)
Creatinine, Ser: 1.06 mg/dL — ABNORMAL HIGH (ref 0.44–1.00)
GFR, Estimated: 58 mL/min — ABNORMAL LOW (ref >60)
Glucose, Bld: 94 mg/dL (ref 70–99)
Potassium: 4 mmol/L (ref 3.5–5.1)
Sodium: 138 mmol/L (ref 135–145)
Total Bilirubin: 0.8 mg/dL (ref 0.3–1.2)
Total Protein: 8.6 g/dL — ABNORMAL HIGH (ref 6.5–8.1)

## 2021-05-10 LAB — CBC
HCT: 49.2 % — ABNORMAL HIGH (ref 36.0–46.0)
Hemoglobin: 16.1 g/dL — ABNORMAL HIGH (ref 12.0–15.0)
MCH: 29.9 pg (ref 26.0–34.0)
MCHC: 32.7 g/dL (ref 30.0–36.0)
MCV: 91.4 fL (ref 80.0–100.0)
Platelets: 265 10*3/uL (ref 150–400)
RBC: 5.38 MIL/uL — ABNORMAL HIGH (ref 3.87–5.11)
RDW: 12.9 % (ref 11.5–15.5)
WBC: 7 10*3/uL (ref 4.0–10.5)
nRBC: 0 % (ref 0.0–0.2)

## 2021-05-10 NOTE — Progress Notes (Signed)
PCP - Dr. Annye Asa Cardiologist - denies  PPM/ICD - n/a  Chest x-ray - n/a EKG - n/a Stress Test - pt unsure, only truly remembers cath procedure.  ECHO - denies Cardiac Cath - 07/2010, pt states it was completely normal. Cath done d/t CP which was eventually ruled as asthma exacerbation. She has not had any episodes since then nor has she followed up with a cardiologist since then.   Sleep Study - OSA+ CPAP - pt states that she no longer wears one d/t weight loss.   Blood Thinner Instructions: n/a Aspirin Instructions: n/a  ERAS Protcol -clear liquids until 0530 DOS PRE-SURGERY Ensure or G2- none ordered.  COVID TEST- ambulatory surgery, not indicated.  Anesthesia review: No  Patient denies shortness of breath, fever, cough and chest pain at PAT appointment   All instructions explained to the patient, with a verbal understanding of the material. Patient agrees to go over the instructions while at home for a better understanding. Patient also instructed to self quarantine after being tested for COVID-19. The opportunity to ask questions was provided.

## 2021-05-13 DIAGNOSIS — C50911 Malignant neoplasm of unspecified site of right female breast: Secondary | ICD-10-CM | POA: Diagnosis not present

## 2021-05-14 ENCOUNTER — Ambulatory Visit
Admission: RE | Admit: 2021-05-14 | Discharge: 2021-05-14 | Disposition: A | Discharge status: Home / Self Care | Payer: BC Managed Care – PPO | Source: Ambulatory Visit | Attending: General Surgery | Admitting: General Surgery

## 2021-05-14 DIAGNOSIS — C50211 Malignant neoplasm of upper-inner quadrant of right female breast: Secondary | ICD-10-CM

## 2021-05-14 DIAGNOSIS — R928 Other abnormal and inconclusive findings on diagnostic imaging of breast: Secondary | ICD-10-CM | POA: Diagnosis not present

## 2021-05-16 NOTE — Anesthesia Preprocedure Evaluation (Addendum)
Anesthesia Evaluation  Patient identified by MRN, date of birth, ID band Patient awake    Reviewed: Allergy & Precautions, NPO status , Patient's Chart, lab work & pertinent test results  History of Anesthesia Complications (+) PONV and history of anesthetic complications  Airway Mallampati: II  TM Distance: >3 FB Neck ROM: Full    Dental no notable dental hx.    Pulmonary asthma , sleep apnea ,    Pulmonary exam normal        Cardiovascular negative cardio ROS   Rhythm:Regular Rate:Normal     Neuro/Psych Seizures -, Well Controlled,  Anxiety    GI/Hepatic Neg liver ROS, GERD  Medicated,  Endo/Other  negative endocrine ROS  Renal/GU negative Renal ROS  negative genitourinary   Musculoskeletal  (+) Arthritis , Osteoarthritis,  Right breast Ca   Abdominal Normal abdominal exam  (+)   Peds  Hematology negative hematology ROS (+)   Anesthesia Other Findings   Reproductive/Obstetrics                            Anesthesia Physical Anesthesia Plan  ASA: 3  Anesthesia Plan: General and Regional   Post-op Pain Management: Regional block*, Tylenol PO (pre-op)*, Celebrex PO (pre-op)* and Gabapentin PO (pre-op)*   Induction: Intravenous  PONV Risk Score and Plan: 4 or greater and Ondansetron, Dexamethasone, Midazolam and Treatment may vary due to age or medical condition  Airway Management Planned: Mask and Oral ETT  Additional Equipment: None  Intra-op Plan:   Post-operative Plan: Extubation in OR  Informed Consent: I have reviewed the patients History and Physical, chart, labs and discussed the procedure including the risks, benefits and alternatives for the proposed anesthesia with the patient or authorized representative who has indicated his/her understanding and acceptance.     Dental advisory given  Plan Discussed with: CRNA  Anesthesia Plan Comments: (Lab Results       Component                Value               Date                      WBC                      7.0                 05/10/2021                HGB                      16.1 (H)            05/10/2021                HCT                      49.2 (H)            05/10/2021                MCV                      91.4                05/10/2021  PLT                      265                 05/10/2021           Lab Results      Component                Value               Date                      NA                       138                 05/10/2021                K                        4.0                 05/10/2021                CO2                      23                  05/10/2021                GLUCOSE                  94                  05/10/2021                BUN                      16                  05/10/2021                CREATININE               1.06 (H)            05/10/2021                CALCIUM                  10.2                05/10/2021                GFRNONAA                 58 (L)              05/10/2021          )       Anesthesia Quick Evaluation

## 2021-05-17 ENCOUNTER — Other Ambulatory Visit: Payer: Self-pay

## 2021-05-17 ENCOUNTER — Encounter (HOSPITAL_COMMUNITY): Payer: Self-pay | Admitting: General Surgery

## 2021-05-17 ENCOUNTER — Ambulatory Visit (HOSPITAL_COMMUNITY)
Admission: RE | Admit: 2021-05-17 | Discharge: 2021-05-17 | Disposition: A | Discharge status: Home / Self Care | Payer: BC Managed Care – PPO | Source: Ambulatory Visit | Attending: General Surgery | Admitting: General Surgery

## 2021-05-17 ENCOUNTER — Ambulatory Visit (HOSPITAL_COMMUNITY): Payer: BC Managed Care – PPO | Admitting: Physician Assistant

## 2021-05-17 ENCOUNTER — Encounter: Payer: Self-pay | Admitting: *Deleted

## 2021-05-17 ENCOUNTER — Ambulatory Visit
Admission: RE | Admit: 2021-05-17 | Discharge: 2021-05-17 | Disposition: A | Discharge status: Home / Self Care | Payer: BC Managed Care – PPO | Source: Ambulatory Visit | Attending: General Surgery | Admitting: General Surgery

## 2021-05-17 ENCOUNTER — Encounter (HOSPITAL_COMMUNITY)
Admission: RE | Disposition: A | Discharge status: Home / Self Care | Payer: Self-pay | Source: Ambulatory Visit | Attending: General Surgery

## 2021-05-17 DIAGNOSIS — T8549XA Other mechanical complication of breast prosthesis and implant, initial encounter: Secondary | ICD-10-CM | POA: Insufficient documentation

## 2021-05-17 DIAGNOSIS — R921 Mammographic calcification found on diagnostic imaging of breast: Secondary | ICD-10-CM | POA: Diagnosis not present

## 2021-05-17 DIAGNOSIS — C50211 Malignant neoplasm of upper-inner quadrant of right female breast: Secondary | ICD-10-CM | POA: Diagnosis not present

## 2021-05-17 DIAGNOSIS — K219 Gastro-esophageal reflux disease without esophagitis: Secondary | ICD-10-CM | POA: Diagnosis not present

## 2021-05-17 DIAGNOSIS — F419 Anxiety disorder, unspecified: Secondary | ICD-10-CM | POA: Diagnosis not present

## 2021-05-17 DIAGNOSIS — X58XXXA Exposure to other specified factors, initial encounter: Secondary | ICD-10-CM | POA: Diagnosis not present

## 2021-05-17 DIAGNOSIS — Z170 Estrogen receptor positive status [ER+]: Secondary | ICD-10-CM | POA: Insufficient documentation

## 2021-05-17 DIAGNOSIS — J45909 Unspecified asthma, uncomplicated: Secondary | ICD-10-CM | POA: Insufficient documentation

## 2021-05-17 DIAGNOSIS — R928 Other abnormal and inconclusive findings on diagnostic imaging of breast: Secondary | ICD-10-CM | POA: Diagnosis not present

## 2021-05-17 DIAGNOSIS — Z79899 Other long term (current) drug therapy: Secondary | ICD-10-CM | POA: Diagnosis not present

## 2021-05-17 DIAGNOSIS — G8918 Other acute postprocedural pain: Secondary | ICD-10-CM | POA: Diagnosis not present

## 2021-05-17 DIAGNOSIS — C50911 Malignant neoplasm of unspecified site of right female breast: Secondary | ICD-10-CM | POA: Diagnosis not present

## 2021-05-17 DIAGNOSIS — M1990 Unspecified osteoarthritis, unspecified site: Secondary | ICD-10-CM | POA: Diagnosis not present

## 2021-05-17 DIAGNOSIS — N6011 Diffuse cystic mastopathy of right breast: Secondary | ICD-10-CM | POA: Diagnosis not present

## 2021-05-17 HISTORY — PX: BREAST LUMPECTOMY WITH RADIOACTIVE SEED AND SENTINEL LYMPH NODE BIOPSY: SHX6550

## 2021-05-17 HISTORY — PX: BREAST RECONSTRUCTION: SHX9

## 2021-05-17 HISTORY — PX: BREAST REDUCTION SURGERY: SHX8

## 2021-05-17 HISTORY — PX: SENTINEL NODE BIOPSY: SHX6608

## 2021-05-17 SURGERY — BREAST LUMPECTOMY WITH RADIOACTIVE SEED AND SENTINEL LYMPH NODE BIOPSY
Anesthesia: Regional | Site: Breast | Laterality: Right

## 2021-05-17 MED ORDER — SUGAMMADEX SODIUM 200 MG/2ML IV SOLN
INTRAVENOUS | Status: DC | PRN
  Administered 2021-05-17: 200 mg via INTRAVENOUS

## 2021-05-17 MED ORDER — ORAL CARE MOUTH RINSE: 15.0000 mL | Freq: Once | OROMUCOSAL | Status: AC

## 2021-05-17 MED ORDER — FENTANYL CITRATE (PF) 250 MCG/5ML IJ SOLN
INTRAMUSCULAR | Status: DC | PRN
  Administered 2021-05-17: 100 ug via INTRAVENOUS
  Administered 2021-05-17 (×2): 50 ug via INTRAVENOUS

## 2021-05-17 MED ORDER — LIDOCAINE 2% (20 MG/ML) 5 ML SYRINGE
INTRAMUSCULAR | Status: DC | PRN
  Administered 2021-05-17: 20 mg via INTRAVENOUS

## 2021-05-17 MED ORDER — 0.9 % SODIUM CHLORIDE (POUR BTL) OPTIME
TOPICAL | Status: DC | PRN
  Administered 2021-05-17: 1000 mL

## 2021-05-17 MED ORDER — BUPIVACAINE HCL (PF) 0.5 % IJ SOLN
INTRAMUSCULAR | Status: DC | PRN
  Administered 2021-05-17: 15 mL

## 2021-05-17 MED ORDER — GABAPENTIN 300 MG PO CAPS
300.0000 mg | ORAL_CAPSULE | ORAL | Status: AC
  Administered 2021-05-17: 300 mg via ORAL
  Filled 2021-05-17: qty 1

## 2021-05-17 MED ORDER — LACTATED RINGERS IV SOLN: INTRAVENOUS | Status: DC

## 2021-05-17 MED ORDER — MIDAZOLAM HCL 2 MG/2ML IJ SOLN
INTRAMUSCULAR | Status: AC
  Administered 2021-05-17: 2 mg
  Filled 2021-05-17: qty 2

## 2021-05-17 MED ORDER — PROPOFOL 10 MG/ML IV BOLUS
INTRAVENOUS | Status: AC
  Filled 2021-05-17: qty 20

## 2021-05-17 MED ORDER — ACETAMINOPHEN 650 MG RE SUPP: 650.0000 mg | RECTAL | Status: DC | PRN

## 2021-05-17 MED ORDER — OXYCODONE HCL 5 MG PO TABS: 5.0000 mg | ORAL_TABLET | ORAL | Status: DC | PRN

## 2021-05-17 MED ORDER — FENTANYL CITRATE (PF) 250 MCG/5ML IJ SOLN
INTRAMUSCULAR | Status: AC
  Filled 2021-05-17: qty 5

## 2021-05-17 MED ORDER — MIDAZOLAM HCL 2 MG/2ML IJ SOLN
INTRAMUSCULAR | Status: AC
  Filled 2021-05-17: qty 2

## 2021-05-17 MED ORDER — BUPIVACAINE LIPOSOME 1.3 % IJ SUSP
INTRAMUSCULAR | Status: DC | PRN
  Administered 2021-05-17: 10 mL

## 2021-05-17 MED ORDER — CHLORHEXIDINE GLUCONATE 0.12 % MT SOLN
15.0000 mL | Freq: Once | OROMUCOSAL | Status: AC
  Administered 2021-05-17: 15 mL via OROMUCOSAL

## 2021-05-17 MED ORDER — CHLORHEXIDINE GLUCONATE CLOTH 2 % EX PADS: 6.0000 | MEDICATED_PAD | Freq: Once | CUTANEOUS | Status: DC

## 2021-05-17 MED ORDER — CHLORHEXIDINE GLUCONATE 0.12 % MT SOLN
15.0000 mL | Freq: Once | OROMUCOSAL | Status: DC
  Filled 2021-05-17: qty 15

## 2021-05-17 MED ORDER — ACETAMINOPHEN 500 MG PO TABS
1000.0000 mg | ORAL_TABLET | ORAL | Status: AC
  Administered 2021-05-17: 1000 mg via ORAL
  Filled 2021-05-17: qty 2

## 2021-05-17 MED ORDER — CELECOXIB 200 MG PO CAPS
200.0000 mg | ORAL_CAPSULE | ORAL | Status: AC
  Administered 2021-05-17: 200 mg via ORAL
  Filled 2021-05-17: qty 1

## 2021-05-17 MED ORDER — FENTANYL CITRATE PF 50 MCG/ML IJ SOSY
50.0000 ug | PREFILLED_SYRINGE | Freq: Once | INTRAMUSCULAR | Status: DC
  Filled 2021-05-17: qty 1

## 2021-05-17 MED ORDER — FENTANYL CITRATE (PF) 100 MCG/2ML IJ SOLN: 25.0000 ug | INTRAMUSCULAR | Status: DC | PRN

## 2021-05-17 MED ORDER — MIDAZOLAM HCL 2 MG/2ML IJ SOLN
2.0000 mg | Freq: Once | INTRAMUSCULAR | Status: AC
  Administered 2021-05-17 (×2): 1 mg via INTRAVENOUS
  Filled 2021-05-17: qty 2

## 2021-05-17 MED ORDER — BUPIVACAINE-EPINEPHRINE 0.25% -1:200000 IJ SOLN
INTRAMUSCULAR | Status: DC | PRN
  Administered 2021-05-17: 5 mL

## 2021-05-17 MED ORDER — PHENYLEPHRINE 40 MCG/ML (10ML) SYRINGE FOR IV PUSH (FOR BLOOD PRESSURE SUPPORT)
PREFILLED_SYRINGE | INTRAVENOUS | Status: DC | PRN
  Administered 2021-05-17: 40 ug via INTRAVENOUS

## 2021-05-17 MED ORDER — BUPIVACAINE-EPINEPHRINE (PF) 0.25% -1:200000 IJ SOLN
INTRAMUSCULAR | Status: AC
  Filled 2021-05-17: qty 60

## 2021-05-17 MED ORDER — DEXAMETHASONE SODIUM PHOSPHATE 10 MG/ML IJ SOLN
INTRAMUSCULAR | Status: DC | PRN
Start: 2021-05-17 — End: 2021-05-17
  Administered 2021-05-17: 10 mg via INTRAVENOUS

## 2021-05-17 MED ORDER — FENTANYL CITRATE (PF) 100 MCG/2ML IJ SOLN
INTRAMUSCULAR | Status: AC
  Administered 2021-05-17: 50 ug
  Filled 2021-05-17: qty 2

## 2021-05-17 MED ORDER — SODIUM CHLORIDE 0.9% FLUSH: 3.0000 mL | Freq: Two times a day (BID) | INTRAVENOUS | Status: DC

## 2021-05-17 MED ORDER — FENTANYL CITRATE (PF) 100 MCG/2ML IJ SOLN
INTRAMUSCULAR | Status: AC
  Filled 2021-05-17: qty 2

## 2021-05-17 MED ORDER — OXYCODONE HCL 5 MG PO TABS: 5.0000 mg | ORAL_TABLET | Freq: Four times a day (QID) | ORAL | 0 refills | Status: DC | PRN

## 2021-05-17 MED ORDER — LIDOCAINE-EPINEPHRINE 1 %-1:100000 IJ SOLN
INTRAMUSCULAR | Status: AC
  Filled 2021-05-17: qty 1

## 2021-05-17 MED ORDER — PROPOFOL 10 MG/ML IV BOLUS
INTRAVENOUS | Status: DC | PRN
  Administered 2021-05-17: 160 mg via INTRAVENOUS

## 2021-05-17 MED ORDER — ACETAMINOPHEN 325 MG PO TABS: 650.0000 mg | ORAL_TABLET | ORAL | Status: DC | PRN

## 2021-05-17 MED ORDER — PROPOFOL 500 MG/50ML IV EMUL
INTRAVENOUS | Status: DC | PRN
  Administered 2021-05-17: 50 ug/kg/min via INTRAVENOUS

## 2021-05-17 MED ORDER — SODIUM CHLORIDE 0.9 % IV SOLN: 250.0000 mL | INTRAVENOUS | Status: DC | PRN

## 2021-05-17 MED ORDER — PHENYLEPHRINE HCL-NACL 20-0.9 MG/250ML-% IV SOLN
INTRAVENOUS | Status: DC | PRN
  Administered 2021-05-17: 25 ug/min via INTRAVENOUS

## 2021-05-17 MED ORDER — ROCURONIUM BROMIDE 10 MG/ML (PF) SYRINGE
PREFILLED_SYRINGE | INTRAVENOUS | Status: DC | PRN
  Administered 2021-05-17: 70 mg via INTRAVENOUS
  Administered 2021-05-17: 20 mg via INTRAVENOUS

## 2021-05-17 MED ORDER — LIDOCAINE-EPINEPHRINE 1 %-1:100000 IJ SOLN
INTRAMUSCULAR | Status: DC | PRN
  Administered 2021-05-17: 5 mL
  Administered 2021-05-17: 10 mL

## 2021-05-17 MED ORDER — SODIUM CHLORIDE 0.9% FLUSH
3.0000 mL | INTRAVENOUS | Status: DC | PRN
Start: 2021-05-17 — End: 2021-05-17

## 2021-05-17 MED ORDER — ORAL CARE MOUTH RINSE: 15.0000 mL | Freq: Once | OROMUCOSAL | Status: DC

## 2021-05-17 MED ORDER — MAGTRACE LYMPHATIC TRACER
INTRAMUSCULAR | Status: DC | PRN
  Administered 2021-05-17: 2 mL via INTRAMUSCULAR

## 2021-05-17 MED ORDER — ONDANSETRON HCL 4 MG/2ML IJ SOLN
INTRAMUSCULAR | Status: DC | PRN
Start: 2021-05-17 — End: 2021-05-17
  Administered 2021-05-17: 4 mg via INTRAVENOUS

## 2021-05-17 MED ORDER — FENTANYL CITRATE (PF) 100 MCG/2ML IJ SOLN
25.0000 ug | INTRAMUSCULAR | Status: DC | PRN
  Administered 2021-05-17 (×2): 25 ug via INTRAVENOUS

## 2021-05-17 MED ORDER — VANCOMYCIN HCL IN DEXTROSE 1-5 GM/200ML-% IV SOLN
1000.0000 mg | INTRAVENOUS | Status: AC
  Administered 2021-05-17: 1000 mg via INTRAVENOUS
  Filled 2021-05-17: qty 200

## 2021-05-17 MED ORDER — PHENYLEPHRINE 40 MCG/ML (10ML) SYRINGE FOR IV PUSH (FOR BLOOD PRESSURE SUPPORT)
PREFILLED_SYRINGE | INTRAVENOUS | Status: AC
  Filled 2021-05-17: qty 10

## 2021-05-17 SURGICAL SUPPLY — 71 items
APPLIER CLIP 9.375 MED OPEN (MISCELLANEOUS) ×4
BAG DECANTER FOR FLEXI CONT (MISCELLANEOUS) ×4 IMPLANT
BINDER BREAST XXLRG (GAUZE/BANDAGES/DRESSINGS) ×1 IMPLANT
BIOPATCH RED 1 DISK 7.0 (GAUZE/BANDAGES/DRESSINGS) IMPLANT
BLADE SURG 10 STRL SS (BLADE) ×5 IMPLANT
CANISTER SUCT 3000ML PPV (MISCELLANEOUS) ×7 IMPLANT
CHLORAPREP W/TINT 26 (MISCELLANEOUS) ×7 IMPLANT
CLIP APPLIE 9.375 MED OPEN (MISCELLANEOUS) ×3 IMPLANT
CNTNR URN SCR LID CUP LEK RST (MISCELLANEOUS) ×3 IMPLANT
CONT SPEC 4OZ STRL OR WHT (MISCELLANEOUS) ×1
COVER PROBE W GEL 5X96 (DRAPES) ×5 IMPLANT
COVER SURGICAL LIGHT HANDLE (MISCELLANEOUS) ×4 IMPLANT
DERMABOND ADVANCED (GAUZE/BANDAGES/DRESSINGS) ×2
DERMABOND ADVANCED .7 DNX12 (GAUZE/BANDAGES/DRESSINGS) ×9 IMPLANT
DEVICE DUBIN SPECIMEN MAMMOGRA (MISCELLANEOUS) ×4 IMPLANT
DRAIN CHANNEL 19F RND (DRAIN) IMPLANT
DRAPE ORTHO SPLIT 77X108 STRL (DRAPES) ×2
DRAPE SURG ORHT 6 SPLT 77X108 (DRAPES) IMPLANT
DRSG OPSITE POSTOP 4X6 (GAUZE/BANDAGES/DRESSINGS) ×2 IMPLANT
DRSG PAD ABDOMINAL 8X10 ST (GAUZE/BANDAGES/DRESSINGS) ×6 IMPLANT
ELECT BLADE 4.0 EZ CLEAN MEGAD (MISCELLANEOUS) ×4
ELECT CAUTERY BLADE 6.4 (BLADE) ×4 IMPLANT
ELECT COATED BLADE 2.86 ST (ELECTRODE) ×4 IMPLANT
ELECT REM PT RETURN 9FT ADLT (ELECTROSURGICAL) ×8
ELECTRODE BLDE 4.0 EZ CLN MEGD (MISCELLANEOUS) ×3 IMPLANT
ELECTRODE REM PT RTRN 9FT ADLT (ELECTROSURGICAL) ×6 IMPLANT
EVACUATOR SILICONE 100CC (DRAIN) IMPLANT
FUNNEL KELLER 2 DISP (MISCELLANEOUS) ×1 IMPLANT
GLOVE SURG ENC MOIS LTX SZ6.5 (GLOVE) ×11 IMPLANT
GLOVE SURG ENC MOIS LTX SZ7.5 (GLOVE) ×8 IMPLANT
GLOVE SURG ENC TEXT LTX SZ7.5 (GLOVE) ×4 IMPLANT
GOWN STRL REUS W/ TWL LRG LVL3 (GOWN DISPOSABLE) ×12 IMPLANT
GOWN STRL REUS W/TWL LRG LVL3 (GOWN DISPOSABLE) ×4
IMPL BREAST GEL 290 (Breast) IMPLANT
IMPLANT BREAST GEL 290 (Breast) ×8 IMPLANT
KIT BASIN OR (CUSTOM PROCEDURE TRAY) ×4 IMPLANT
KIT MARKER MARGIN INK (KITS) ×4 IMPLANT
MARKER SKIN DUAL TIP RULER LAB (MISCELLANEOUS) ×4 IMPLANT
NDL 18GX1X1/2 (RX/OR ONLY) (NEEDLE) IMPLANT
NDL FILTER BLUNT 18X1 1/2 (NEEDLE) IMPLANT
NDL HYPO 25GX1X1/2 BEV (NEEDLE) ×6 IMPLANT
NEEDLE 18GX1X1/2 (RX/OR ONLY) (NEEDLE) ×4 IMPLANT
NEEDLE FILTER BLUNT 18X 1/2SAF (NEEDLE) ×1
NEEDLE FILTER BLUNT 18X1 1/2 (NEEDLE) ×3 IMPLANT
NEEDLE HYPO 25GX1X1/2 BEV (NEEDLE) ×8 IMPLANT
NS IRRIG 1000ML POUR BTL (IV SOLUTION) ×7 IMPLANT
PACK GENERAL/GYN (CUSTOM PROCEDURE TRAY) ×7 IMPLANT
PAD ABD 8X10 STRL (GAUZE/BANDAGES/DRESSINGS) ×3 IMPLANT
SIZER BREAST 290 (SIZER) ×4
SIZER BRST 290 (SIZER) IMPLANT
SPONGE T-LAP 18X18 ~~LOC~~+RFID (SPONGE) ×7 IMPLANT
STRIP CLOSURE SKIN 1/2X4 (GAUZE/BANDAGES/DRESSINGS) ×9 IMPLANT
SUT MNCRL AB 3-0 PS2 27 (SUTURE) ×3 IMPLANT
SUT MNCRL AB 4-0 PS2 18 (SUTURE) ×9 IMPLANT
SUT MON AB 3-0 SH 27 (SUTURE)
SUT MON AB 3-0 SH27 (SUTURE) IMPLANT
SUT MON AB 4-0 PS1 27 (SUTURE) ×2 IMPLANT
SUT MON AB 5-0 PS2 18 (SUTURE) IMPLANT
SUT PDS AB 2-0 CT2 27 (SUTURE) IMPLANT
SUT PDS AB 3-0 SH 27 (SUTURE) ×5 IMPLANT
SUT SILK 3 0 PS 1 (SUTURE) IMPLANT
SUT VIC AB 3-0 SH 18 (SUTURE) ×4 IMPLANT
SUT VIC AB 3-0 SH 27 (SUTURE)
SUT VIC AB 3-0 SH 27X BRD (SUTURE) IMPLANT
SUT VICRYL 4-0 PS2 18IN ABS (SUTURE) IMPLANT
SYR 50ML LL SCALE MARK (SYRINGE) IMPLANT
SYR BULB IRRIG 60ML STRL (SYRINGE) ×1 IMPLANT
SYR CONTROL 10ML LL (SYRINGE) ×7 IMPLANT
TOWEL GREEN STERILE (TOWEL DISPOSABLE) ×4 IMPLANT
TOWEL GREEN STERILE FF (TOWEL DISPOSABLE) ×10 IMPLANT
TRACER MAGTRACE VIAL (MISCELLANEOUS) ×1 IMPLANT

## 2021-05-17 NOTE — Interval H&P Note (Signed)
History and Physical Interval Note:  05/17/2021 8:11 AM  Christie Thomas  has presented today for surgery, with the diagnosis of RIGHT BREAST CANCER.  The various methods of treatment have been discussed with the patient and family. After consideration of risks, benefits and other options for treatment, the patient has consented to  Procedure(s): RIGHT BREAST LUMPECTOMY WITH RADIOACTIVE SEED AND SENTINEL LYMPH NODE BIOPSY (Right) ONCOPLASTIC BREAST REDUCTION (Bilateral) as a surgical intervention.  The patient's history has been reviewed, patient examined, no change in status, stable for surgery.  I have reviewed the patient's chart and labs.  Questions were answered to the patient's satisfaction.     Autumn Messing III

## 2021-05-17 NOTE — Anesthesia Procedure Notes (Addendum)
Procedure Name: Intubation Date/Time: 05/17/2021 8:34 AM Performed by: Lavell Luster, CRNA Pre-anesthesia Checklist: Patient identified, Emergency Drugs available, Suction available, Patient being monitored and Timeout performed Patient Re-evaluated:Patient Re-evaluated prior to induction Oxygen Delivery Method: Circle system utilized Preoxygenation: Pre-oxygenation with 100% oxygen Induction Type: IV induction Ventilation: Mask ventilation without difficulty Laryngoscope Size: Mac and 3 Grade View: Grade I Tube type: Oral Tube size: 7.0 mm Number of attempts: 1 Airway Equipment and Method: Stylet Placement Confirmation: ETT inserted through vocal cords under direct vision, positive ETCO2 and breath sounds checked- equal and bilateral Secured at: 20 cm Tube secured with: Tape Dental Injury: Teeth and Oropharynx as per pre-operative assessment

## 2021-05-17 NOTE — Transfer of Care (Signed)
Immediate Anesthesia Transfer of Care Note  Patient: Christie Thomas  Procedure(s) Performed: RIGHT BREAST LUMPECTOMY WITH RADIOACTIVE SEED (Right: Breast) RIGHT SENTINEL LYMPH NODE BIOPSY WITH MAGTRACE INJECTION (Right: Axilla) ONCOPLASTIC BREAST REDUCTION (Bilateral: Breast) BREAST RECONSTRUCTION REMOVAL IMPLANTS AND REPLACEMENT OF IMPLANTS (Bilateral: Breast)  Patient Location: PACU  Anesthesia Type:General  Level of Consciousness: awake, alert  and oriented  Airway & Oxygen Therapy: Patient connected to face mask oxygen  Post-op Assessment: Post -op Vital signs reviewed and stable  Post vital signs: stable  Last Vitals:  Vitals Value Taken Time  BP 131/63 05/17/21 1026  Temp    Pulse 98 05/17/21 1027  Resp 18 05/17/21 1027  SpO2 96 % 05/17/21 1027  Vitals shown include unvalidated device data.  Last Pain:  Vitals:   05/17/21 0707  TempSrc:   PainSc: 0-No pain         Complications: No notable events documented.

## 2021-05-17 NOTE — Op Note (Signed)
Op report    DATE OF OPERATION:  05/17/2021  LOCATION: [facility] Main Operating Room Outpatient  SURGICAL DIVISION: Plastic Surgery  PREOPERATIVE DIAGNOSES:  1. Right Breast cancer.   2. Bilateral ruptured breast implants  POSTOPERATIVE DIAGNOSES:  Same as preoperative diagnosis  PROCEDURE:  1. Oncoplastic breast surgery. 2. Removal of bilateral ruptured silicone implants 308 cc textured. 3. Placement of bilateral breast implants. 4. Bilateral mastopexy.  SURGEON: Jhan Conery Sanger Isel Skufca, DO  ASSISTANT: Roetta Sessions, PA  ANESTHESIA:  General.   COMPLICATIONS: None.   IMPLANTS: Left - Mentor Moderate profile Boost gel implants 290 cc. Ref #[id number].  Serial Number [id number] Right - Mentor Moderate profile Boost gel implants 290 cc. Ref #[id number].  Serial [id number]  INDICATIONS FOR PROCEDURE:  The patient, Christie Thomas, is a 66 y.o. female born on [birth year], is here for  immediate oncoplastic breast reconstruction with removal of bilateral ruptured breast implants, placement of new implants and mastopexy after partial mastectomy of right breast for cancer. MRN: [id number]  CONSENT:  Informed consent was obtained directly from the patient. Risks, benefits and alternatives were fully discussed. Specific risks including but not limited to bleeding, infection, hematoma, seroma, scarring, pain, implant infection, implant extrusion, capsular contracture, asymmetry, wound healing problems, and need for further surgery were all discussed. The patient did have an ample opportunity to have her questions answered to her satisfaction.   DESCRIPTION OF PROCEDURE:  The patient was taken to the operating room by the general surgery team. SCDs were placed and IV antibiotics were given. The patient's chest was prepped and draped in a sterile fashion. A time out was performed and the implants to be used were identified.  A right partial mastectomy was performed.  Once the  general surgery team had completed their portion of the case the patient was rendered to the plastic and reconstructive surgery team.  Left:  The patient was marked for the lollipop incision mastopexy.  The #10 blade was used to make the incisions as marked.  The skin was de-epithelialized around the areola and vertical limb.  The lateral vertical limb was the incised and the bovie was used to dissect to the breast implant capsule.  The capsule was opened.  There was clearly a ruptured implant.  The material was removed.  The pocket was irrigated with saline and antibiotic solution.  The new implants was placed after using the sizer. and the capsule was closed with the 3-0 PDS.  The deep layers were closed with 3-0 Monocryl followed by 4-0 Monocryl.  The areola was lifted into position and closed with the 3-0 and 4-0 Monocryl.  Right:  The patient was marked for the lollipop incision mastopexy.  The #10 blade was used to make the incisions as marked.  The skin was de-epithelialized around the areola and vertical limb.  The lateral vertical limb was the incised and the bovie was used to dissect to the breast implant capsule.  The capsule was opened.  There was clearly a ruptured implant.  The material was removed.  The pocket was irrigated with saline and antibiotic solution.  The new implants was placed after using the sizer. and the capsule was closed with the 3-0 PDS.  The deep layers were closed with 3-0 Monocryl followed by 4-0 Monocryl.  The areola was lifted into position and closed with the 3-0 and 4-0 Monocryl.  The steri strips and derma bond were placed. ABDs and breast binder were placed.  The patient tolerated the procedure well  and there were no complications.  The patient was allowed to wake from anesthesia and taken to the recovery room in satisfactory condition.   The advanced practice practitioner (APP) assisted throughout the case.  The APP was essential in retraction and counter traction  when needed to make the case progress smoothly.  This retraction and assistance made it possible to see the tissue plans for the procedure.  The assistance was needed for blood control, tissue re-approximation and assisted with closure of the incision site.

## 2021-05-17 NOTE — Anesthesia Procedure Notes (Signed)
Anesthesia Regional Block: Pectoralis block   Pre-Anesthetic Checklist: , timeout performed,  Correct Patient, Correct Site, Correct Laterality,  Correct Procedure, Correct Position, site marked,  Risks and benefits discussed,  Surgical consent,  Pre-op evaluation,  At surgeon's request and post-op pain management  Laterality: Right  Prep: Dura Prep       Needles:  Injection technique: Single-shot  Needle Type: Echogenic Stimulator Needle     Needle Length: 10cm  Needle Gauge: 20     Additional Needles:   Procedures:,,,, ultrasound used (permanent image in chart),,    Narrative:  Start time: 05/17/2021 8:10 AM End time: 05/17/2021 8:16 AM Injection made incrementally with aspirations every 5 mL.  Performed by: Personally  Anesthesiologist: Darral Dash, DO  Additional Notes: Patient identified. Risks/Benefits/Options discussed with patient including but not limited to bleeding, infection, nerve damage, failed block, incomplete pain control. Patient expressed understanding and wished to proceed. All questions were answered. Sterile technique was used throughout the entire procedure. Please see nursing notes for vital signs. Aspirated in 5cc intervals with injection for negative confirmation. Patient was given instructions on fall risk and not to get out of bed. All questions and concerns addressed with instructions to call with any issues or inadequate analgesia.

## 2021-05-17 NOTE — Progress Notes (Signed)
Oncology Nurse Navigator Documentation  Oncology Nurse Navigator Flowsheets 05/17/2021  Abnormal Finding Date -  Confirmed Diagnosis Date -  Planned Course of Treatment -  Phase of Treatment -  Expected Surgery Date -  Surgery Pending- Reason: -  Surgery Actual Start Date: 05/17/2021  Navigator Follow Up Date: 05/21/2021  Navigator Follow Up Reason: Pathology  Navigator Location [facility]  Navigator Encounter Type Appt/Treatment Plan Review  Patient Visit Type MedOnc  Treatment Phase Pre-Tx/Tx Discussion  Barriers/Navigation Needs Coordination of Care;Education  Education -  Interventions None Required  Acuity Level 2-Minimal Needs (1-2 Barriers Identified)  Coordination of Care -  Education Method -  Support Groups/Services Friends and Family  Time Spent with Patient 15

## 2021-05-17 NOTE — Anesthesia Postprocedure Evaluation (Signed)
Anesthesia Post Note  Patient: Christie Thomas  Procedure(s) Performed: RIGHT BREAST LUMPECTOMY WITH RADIOACTIVE SEED (Right: Breast) RIGHT SENTINEL LYMPH NODE BIOPSY WITH MAGTRACE INJECTION (Right: Axilla) ONCOPLASTIC BREAST REDUCTION (Bilateral: Breast) BREAST RECONSTRUCTION REMOVAL IMPLANTS AND REPLACEMENT OF IMPLANTS (Bilateral: Breast)     Patient location during evaluation: PACU Anesthesia Type: Regional and General Level of consciousness: awake and alert Pain management: pain level controlled Vital Signs Assessment: post-procedure vital signs reviewed and stable Respiratory status: spontaneous breathing, nonlabored ventilation, respiratory function stable and patient connected to nasal cannula oxygen Cardiovascular status: blood pressure returned to baseline and stable Postop Assessment: no apparent nausea or vomiting Anesthetic complications: no   No notable events documented.  Last Vitals:  Vitals:   05/17/21 1041 05/17/21 1056  BP: 120/68 122/65  Pulse: 85 82  Resp: 13 14  Temp:    SpO2: 100% 96%    Last Pain:  Vitals:   05/17/21 1100  TempSrc:   PainSc: 0-No pain                 Belenda Cruise P Wenceslao Loper

## 2021-05-17 NOTE — Op Note (Signed)
05/17/2021  9:35 AM  PATIENT:  Christie Thomas  66 y.o. female  PRE-OPERATIVE DIAGNOSIS:  RIGHT BREAST CANCER  POST-OPERATIVE DIAGNOSIS:  RIGHT BREAST CANCER  PROCEDURE:  Procedure(s): RIGHT BREAST LUMPECTOMY WITH RADIOACTIVE SEED LOCALIZATION AND DEEP RIGHT AXILLARY SENTINEL LYMPH NODE BIOPSY (Right)  SURGEON:  Surgeon(s) and Role: Panel 1:    * Jovita Kussmaul, MD - Primary  PHYSICIAN ASSISTANT:   ASSISTANTS: none   ANESTHESIA:   local and epidural  EBL:  Minimal   BLOOD ADMINISTERED:none  DRAINS: none   LOCAL MEDICATIONS USED:  MARCAINE     SPECIMEN:  Source of Specimen:  right breast tissue and sentinel node  DISPOSITION OF SPECIMEN:  PATHOLOGY  COUNTS:  YES  TOURNIQUET:  * No tourniquets in log *  DICTATION: .Dragon Dictation  After informed consent was obtained the patient was brought to the operating room and placed in the supine position on the operating table.  After adequate induction of general anesthesia the patient's bilateral chest, breast, and axillary areas were prepped with ChloraPrep, allowed to dry, and draped in usual sterile manner.  An appropriate timeout was performed.  At this point, 2 cc of iron oxide were injected in the subareolar plexus on the right and the breast was massaged for 5 minutes.  Also previously an I-125 seed was placed in the upper inner quadrant of the right breast to mark an area of invasive breast cancer.  The mag trace was used to identify a signal in the right axilla.  The area overlying this was infiltrated with quarter percent Marcaine.  A transversely oriented incision was made with a 15 blade knife overlying the area of signal.  The incision was carried through the skin and subcutaneous tissue sharply with the electrocautery until the deep right axillary space was entered.  The mag trace was used to identify the signal.  This area was excised sharply with the electrocautery and the surrounding small vessels and lymphatics  were controlled with clips.  This was sent as sentinel node #1.  I suspect that there were 2-3 nodes with signal in the specimen that was removed.  There were no other hot or palpable nodes identified in the right axilla.  Hemostasis was achieved using the Bovie electrocautery.  The deep layer of the incision was closed with interrupted 3-0 Vicryl stitches.  The skin was closed with a running 4-0 Monocryl subcuticular stitch.  Attention was then turned to the right breast.  The neoprobe was set to I-125 in the area of radioactivity was readily identified.  It was bright beneath the planned incision for the breast left.  An incision was made along the mapped out lines overlying the area of radioactivity with a 15 blade knife.  The incision was carried through the skin and subcutaneous tissue sharply with the electrocautery.  Dissection was then carried towards the radioactive seed under the direction of the neoprobe.  Once I more closely approached the radioactive seed I then removed a circular portion of breast tissue sharply with the electrocautery around the radioactive seed while checking the area of radioactivity frequently.  Once the specimen was removed it was oriented with the appropriate paint colors.  A specimen radiograph was obtained that showed the clip and seed to be near the center of the specimen.  The specimen was then sent to pathology for further evaluation.  Hemostasis was achieved using the Bovie electrocautery.  The cavity was marked with clips.  The deep layer of the  incision was then closed with interrupted 3-0 Vicryl stitches.  At this point the operation was turned over to Dr. Marla Roe for the lift.  Her portion will be dictated separately.  The patient tolerated the procedure well.  At the end of the case all needle sponge and instrument counts were correct.  The patient was in stable condition.  PLAN OF CARE: Discharge to home after PACU  PATIENT DISPOSITION:  PACU - hemodynamically  stable.   Delay start of Pharmacological VTE agent (>24hrs) due to surgical blood loss or risk of bleeding: not applicable

## 2021-05-17 NOTE — Interval H&P Note (Signed)
History and Physical Interval Note:  05/17/2021 7:25 AM  Christie Thomas  has presented today for surgery, with the diagnosis of RIGHT BREAST CANCER.  The various methods of treatment have been discussed with the patient and family. After consideration of risks, benefits and other options for treatment, the patient has consented to  Procedure(s): RIGHT BREAST LUMPECTOMY WITH RADIOACTIVE SEED AND SENTINEL LYMPH NODE BIOPSY (Right) ONCOPLASTIC BREAST REDUCTION (Bilateral) as a surgical intervention.  The patient's history has been reviewed, patient examined, no change in status, stable for surgery.  I have reviewed the patient's chart and labs.  Questions were answered to the patient's satisfaction.     Loel Lofty Rayn Shorb

## 2021-05-17 NOTE — H&P (Signed)
REFERRING PHYSICIAN: Midge Minium, MD  PROVIDER: Landry Corporal, MD  MRN: [id number] DOB: [birth year] Subjective  Chief Complaint: Breast Cancer   History of Present Illness: Christie Thomas is a 66 y.o. female who is seen today as an office consultation at the request of Dr. Birdie Riddle for evaluation of Breast Cancer .   We are asked to see the patient in consultation by Dr. Birdie Riddle to evaluate her for a new right breast cancer. The patient is a 66 year old white female who recently went for a routine screening mammogram. At that time she was found to have a 1 cm mass in the upper inner quadrant of the right breast. The axilla looked normal. The mass was biopsied and came back as an invasive lobular type of cancer. She is otherwise in good health. She does not smoke. She has no family history of breast cancer. She does have bilateral retropectoral implants and she feels as though one of them is leaking. These were placed in the 1980s in [place]: A complete review of systems was obtained from the patient. I have reviewed this information and discussed as appropriate with the patient. See HPI as well for other ROS.  ROS   Medical History: Past Medical History:  Diagnosis Date   Anxiety   Asthma, unspecified asthma severity, unspecified whether complicated, unspecified whether persistent   GERD (gastroesophageal reflux disease)   Patient Active Problem List  Diagnosis   Malignant neoplasm of upper-inner quadrant of right breast in female, estrogen receptor positive (CMS-HCC)   Past Surgical History:  Procedure Laterality Date   CHOLECYSTECTOMY   TRANSUMBILICAL AUGMENTATION MAMMAPLASTY    Allergies  Allergen Reactions   Penicillins Other (See Comments) and Unknown  Throat swelling Throat swelling   Keflex [Cephalexin] Unknown   Current Outpatient Medications on File Prior to Visit  Medication Sig Dispense Refill   albuterol 90 mcg/actuation  inhaler Inhale 2 inhalations into the lungs   clonazePAM (KLONOPIN) 0.5 MG tablet TAKE 1 TABLET 3 TIMES DAILY AS NEEDED FOR ANXIETY.   fenofibrate 160 MG tablet TAKE 1 TABLET ONCE DAILY WITH FOOD.   progesterone (PROMETRIUM) 100 MG capsule Take 1 capsule (100 mg total) by mouth at bedtime   rosuvastatin (CRESTOR) 10 MG tablet Take 1 tablet (10 mg total) by mouth once daily   testosterone 100 mg Pllt by Implant route.   No current facility-administered medications on file prior to visit.   Family History  Problem Relation Age of Onset   Stroke Mother   Hyperlipidemia (Elevated cholesterol) Mother   Diabetes Mother   Myocardial Infarction (Heart attack) Brother    Social History   Tobacco Use  Smoking Status Never  Smokeless Tobacco Never    Social History   Socioeconomic History   Marital status: Married  Tobacco Use   Smoking status: Never   Smokeless tobacco: Never   Objective:   Vitals:   BP: 126/84  Pulse: 100  Weight: 89.8 kg (198 lb)  Height: 162.6 cm (5\' 4" )   Body mass index is 33.99 kg/m.  Physical Exam Vitals reviewed.  Constitutional:  General: She is not in acute distress. Appearance: Normal appearance.  HENT:  Head: Normocephalic and atraumatic.  Right Ear: External ear normal.  Left Ear: External ear normal.  Nose: Nose normal.  Mouth/Throat:  Mouth: Mucous membranes are moist.  Pharynx: Oropharynx is clear.  Eyes:  General: No scleral icterus. Extraocular Movements: Extraocular movements intact.  Conjunctiva/sclera: Conjunctivae normal.  Pupils:  Pupils are equal, round, and reactive to light.  Cardiovascular:  Rate and Rhythm: Normal rate and regular rhythm.  Pulses: Normal pulses.  Heart sounds: Normal heart sounds.  Pulmonary:  Effort: Pulmonary effort is normal. No respiratory distress.  Breath sounds: Normal breath sounds.  Abdominal:  General: Bowel sounds are normal.  Palpations: Abdomen is soft.  Tenderness: There is no  abdominal tenderness.  Musculoskeletal:  General: No swelling, tenderness or deformity. Normal range of motion.  Cervical back: Normal range of motion and neck supple.  Skin: General: Skin is warm and dry.  Coloration: Skin is not jaundiced.  Neurological:  General: No focal deficit present.  Mental Status: She is alert and oriented to person, place, and time.  Psychiatric:  Mood and Affect: Mood normal.  Behavior: Behavior normal.     Breast: There is no palpable mass in either breast. There is no palpable axillary, supraclavicular, or cervical lymphadenopathy.  Labs, Imaging and Diagnostic Testing:  Assessment and Plan:  Diagnoses and all orders for this visit:  Malignant neoplasm of upper-inner quadrant of right breast in female, estrogen receptor positive (CMS-HCC)    The patient appears to have a small stage I invasive lobular cancer in the upper inner quadrant of the right breast with clinically negative nodes. I have discussed with her in detail the different options for treatment. At this point she would like to meet with plastic surgery to talk about the implants that she has not whether they should be removed. Given the lobular nature of the cancer we will also evaluate her with MRI. I will refer her to medical and radiation oncology to discuss adjuvant therapy. I will also refer her to physical therapy for preoperative lymphedema testing. Once we have the rest of the information and everybody has had a chance to see her then she will be able to make her final decision as to how she would like to proceed and let us know.

## 2021-05-17 NOTE — Discharge Instructions (Signed)
INSTRUCTIONS FOR AFTER BREAST SURGERY   You will likely have some questions about what to expect following your operation.  The following information will help you and your family understand what to expect when you are discharged from the hospital.  Following these guidelines will help ensure a smooth recovery and reduce risks of complications.  Postoperative instructions include information on: diet, wound care, medications and physical activity.  AFTER SURGERY Expect to go home after the procedure.  In some cases, you may need to spend one night in the hospital for observation.  DIET Breast surgery does not require a specific diet.  However, the healthier you eat the better your body can start healing. It is important to increasing your protein intake.  This means limiting the foods with sugar and carbohydrates.  Focus on vegetables and some meat.  If you have any liposuction during your procedure be sure to drink water.  If your urine is bright yellow, then it is concentrated, and you need to drink more water.  As a general rule after surgery, you should have 8 ounces of water every hour while awake.  If you find you are persistently nauseated or unable to take in liquids let us know.  NO TOBACCO USE or EXPOSURE.  This will slow your healing process and increase the risk of a wound.  WOUND CARE Leave the ACE wrap or binder on for 3 days . Use fragrance free soap.   After 3 days you can remove the ACE wrap or binder to shower. Once dry apply ACE wrap, binder or sports bra.  Use a mild soap like Dial, Dove and [place]. You may have Topifoam or Lipofoam on.  It is soft and spongy and helps keep you from getting creases if you have liposuction.  This can be removed before the shower and then replaced.  If you need more it is available on Amazon (Lipofoam). If you have steri-strips / tape directly attached to your skin leave them in place. It is OK to get these wet.   No baths, pools or hot tubs for four  weeks. We close your incision to leave the smallest and best-looking scar. No ointment or creams on your incisions until given the go ahead.  Especially not Neosporin (Too many skin reactions with this one).  A few weeks after surgery you can use Mederma and start massaging the scar. We ask you to wear your binder or sports bra for the first 6 weeks around the clock, including while sleeping. This provides added comfort and helps reduce the fluid accumulation at the surgery site.  ACTIVITY No heavy lifting until cleared by the doctor.  This usually means no more than a half-gallon of milk.  It is OK to walk and climb stairs. In fact, moving your legs is very important to decrease your risk of a blood clot.  It will also help keep you from getting deconditioned.  Every 1 to 2 hours get up and walk for 5 minutes. This will help with a quicker recovery back to normal.  Let pain be your guide so you don't do too much.  This is not the time for spring cleaning and don't plan on taking care of anyone else.  This time is for you to recover,  You will be more comfortable if you sleep and rest with your head elevated either with a few pillows under you or in a recliner.  No stomach sleeping for a three months.  WORK Everyone  returns to work at different times. As a rough guide, most people take at least 1 - 2 weeks off prior to returning to work. If you need documentation for your job, bring the forms to your postoperative follow up visit.  DRIVING Arrange for someone to bring you home from the hospital.  You may be able to drive a few days after surgery but not while taking any narcotics or valium.  BOWEL MOVEMENTS Constipation can occur after anesthesia and while taking pain medication.  It is important to stay ahead for your comfort.  We recommend taking Milk of Magnesia (2 tablespoons; twice a day) while taking the pain pills.  MEDICATIONS You may be prescribed should start after surgery At your  preoperative visit for you history and physical you may have been given the following medications: An antibiotic: Start this medication when you get home and take according to the instructions on the bottle. Zofran 4 mg:  This is to treat nausea and vomiting.  You can take this every 6 hours as needed and only if needed. Valium 2 mg: This is for muscle tightness if you have an implant or expander. This will help relax your muscle which also helps with pain control.  This can be taken every 12 hours as needed. Don't drive after taking this medication. Norco (hydrocodone/acetaminophen) 5/325 mg:  This is only to be used after you have taken the motrin or the tylenol. Every 8 hours as needed.   Over the counter Medication to take: Ibuprofen (Motrin) 600 mg:  Take this every 6 hours.  If you have additional pain then take 500 mg of the tylenol every 8 hours.  Only take the Norco after you have tried these two. Miralax or stool softener of choice: Take this according to the bottle if you take the [place] Call your surgeon's office if any of the following occur: Fever 101 degrees F or greater Excessive bleeding or fluid from the incision site. Pain that increases over time without aid from the medications Redness, warmth, or pus draining from incision sites Persistent nausea or inability to take in liquids Severe misshapen area that underwent the operation.  Here are some resources:  Plastic surgery website: https://www.plasticsurgery.org/for-medical-professionals/education-and-resources/publications/breast-reconstruction-magazine Breast Reconstruction Awareness Campaign:  [employer] Plastic surgery Implant information:  https://www.plasticsurgery.org/patient-safety/breast-implant-safety

## 2021-05-18 ENCOUNTER — Encounter (HOSPITAL_COMMUNITY): Payer: Self-pay | Admitting: General Surgery

## 2021-05-19 ENCOUNTER — Telehealth: Payer: Self-pay | Admitting: Family Medicine

## 2021-05-19 ENCOUNTER — Other Ambulatory Visit: Payer: Self-pay

## 2021-05-19 DIAGNOSIS — E785 Hyperlipidemia, unspecified: Secondary | ICD-10-CM

## 2021-05-19 MED ORDER — FENOFIBRATE 160 MG PO TABS: ORAL_TABLET | ORAL | 0 refills | Status: DC

## 2021-05-19 NOTE — Telephone Encounter (Signed)
Reason for Call Medication Question / Request ?Initial Comment Caller states she is needing her prescriptions refilled. She just had surgery on Monday. ?Additional Comment Provided office hours. She would like for her prescriptions to be called in to [facility]. The ?phone number is [phone] ?Disp. Time Disposition Final User ?05/18/2021 4:56:49 PM General Information Provided Yes Jaynie Crumble ?

## 2021-05-19 NOTE — Telephone Encounter (Signed)
Ok to refill meds but she does need to schedule a CPE at her convenience ?

## 2021-05-19 NOTE — Telephone Encounter (Signed)
Verified which med she needed refilled, patient aware fenofibrate was sent to pharmacy ?

## 2021-05-20 ENCOUNTER — Encounter: Payer: Self-pay | Admitting: *Deleted

## 2021-05-20 LAB — SURGICAL PATHOLOGY

## 2021-05-20 NOTE — Progress Notes (Signed)
Per Dr Marin Olp, request for Oncotype Score sent on specimen [id number] DOS 05/17/21. ? ?Dr Marin Olp would like to see patient in follow up in about 4 weeks.  ? ?Oncology Nurse Navigator Documentation ? ?Oncology Nurse Navigator Flowsheets 05/20/2021  ?Abnormal Finding Date -  ?Confirmed Diagnosis Date -  ?Planned Course of Treatment -  ?Phase of Treatment -  ?Expected Surgery Date -  ?Surgery Pending- Reason: -  ?Surgery Actual Start Date: -  ?Navigator Follow Up Date: 06/08/2021  ?Navigator Follow Up Reason: Follow-up Appointment  ?Navigator Location [facility]  ?Navigator Encounter Type Pathology Review;Telephone  ?Telephone Appt Confirmation/Clarification;Outgoing Call  ?Patient Visit Type MedOnc  ?Treatment Phase Active Tx  ?Barriers/Navigation Needs Coordination of Care;Education  ?Education Other  ?Interventions Coordination of Care;Psycho-Social Support  ?Acuity Level 2-Minimal Needs (1-2 Barriers Identified)  ?Coordination of Care Appts  ?Education Method -  ?Support Groups/Services Friends and Family  ?Time Spent with Patient 60  ?  ? ?

## 2021-05-27 ENCOUNTER — Ambulatory Visit (INDEPENDENT_AMBULATORY_CARE_PROVIDER_SITE_OTHER): Payer: BC Managed Care – PPO | Admitting: Plastic Surgery

## 2021-05-27 ENCOUNTER — Encounter: Payer: Self-pay | Admitting: Plastic Surgery

## 2021-05-27 DIAGNOSIS — Z170 Estrogen receptor positive status [ER+]: Secondary | ICD-10-CM

## 2021-05-27 DIAGNOSIS — C50211 Malignant neoplasm of upper-inner quadrant of right female breast: Secondary | ICD-10-CM | POA: Diagnosis not present

## 2021-05-27 NOTE — Progress Notes (Signed)
The patient is a 66 year old female here for follow-up after undergoing bilateral oncoplastic breast surgery 05/17/21.  She had removal of bilateral ruptured silicone implants they were textured.  She had placement of new implants with bilateral mastopexy at the same time as a partial mastectomy of the right breast mass.  She now has Mentor moderate profile boost gel implants that are 290 cc.  There is no sign of infection or seroma.  I went ahead and changed her dressings and her Steri-Strips.  Her bowels are moving.  She can go into a sports bra.  We will plan to see her back in 1 week.  We will plan to get pictures at that visit. ?

## 2021-05-28 ENCOUNTER — Encounter: Payer: BC Managed Care – PPO | Admitting: Plastic Surgery

## 2021-05-28 ENCOUNTER — Encounter: Payer: Self-pay | Admitting: *Deleted

## 2021-05-28 NOTE — Progress Notes (Signed)
Received Oncotype Score results of 28. Dr Marin Olp has a copy.  ? ?Oncology Nurse Navigator Documentation ? ?Oncology Nurse Navigator Flowsheets 05/28/2021  ?Abnormal Finding Date -  ?Confirmed Diagnosis Date -  ?Planned Course of Treatment -  ?Phase of Treatment -  ?Expected Surgery Date -  ?Surgery Pending- Reason: -  ?Surgery Actual Start Date: -  ?Navigator Follow Up Date: 06/08/2021  ?Navigator Follow Up Reason: Follow-up Appointment  ?Navigator Location [facility]  ?Navigator Encounter Type Pathology Review  ?Telephone -  ?Patient Visit Type MedOnc  ?Treatment Phase Active Tx  ?Barriers/Navigation Needs Coordination of Care;Education  ?Education -  ?Interventions Coordination of Care  ?Acuity Level 2-Minimal Needs (1-2 Barriers Identified)  ?Coordination of Care Pathology  ?Education Method -  ?Support Groups/Services Friends and Family  ?Time Spent with Patient 15  ?  ?

## 2021-06-01 ENCOUNTER — Telehealth: Payer: Self-pay | Admitting: Family Medicine

## 2021-06-01 DIAGNOSIS — C50911 Malignant neoplasm of unspecified site of right female breast: Secondary | ICD-10-CM | POA: Diagnosis not present

## 2021-06-01 MED ORDER — CLONAZEPAM 0.5 MG PO TABS: ORAL_TABLET | ORAL | 1 refills | Status: DC

## 2021-06-01 NOTE — Telephone Encounter (Signed)
Pt called in asking for a refill on the Clonazepam to be sent to the [facility] on [address]  ? ?Please advise  ?

## 2021-06-02 ENCOUNTER — Encounter (HOSPITAL_COMMUNITY): Payer: Self-pay

## 2021-06-03 ENCOUNTER — Encounter (HOSPITAL_COMMUNITY): Payer: Self-pay

## 2021-06-07 ENCOUNTER — Other Ambulatory Visit: Payer: Self-pay | Admitting: *Deleted

## 2021-06-07 DIAGNOSIS — C50111 Malignant neoplasm of central portion of right female breast: Secondary | ICD-10-CM

## 2021-06-08 ENCOUNTER — Encounter: Payer: Self-pay | Admitting: *Deleted

## 2021-06-08 ENCOUNTER — Other Ambulatory Visit: Payer: Self-pay

## 2021-06-08 ENCOUNTER — Inpatient Hospital Stay (HOSPITAL_BASED_OUTPATIENT_CLINIC_OR_DEPARTMENT_OTHER): Payer: BC Managed Care – PPO | Admitting: Hematology & Oncology

## 2021-06-08 ENCOUNTER — Encounter: Payer: Self-pay | Admitting: Hematology & Oncology

## 2021-06-08 ENCOUNTER — Inpatient Hospital Stay: Payer: BC Managed Care – PPO | Attending: Hematology & Oncology

## 2021-06-08 ENCOUNTER — Ambulatory Visit: Payer: BC Managed Care – PPO | Attending: General Surgery

## 2021-06-08 ENCOUNTER — Telehealth: Payer: Self-pay | Admitting: Hematology & Oncology

## 2021-06-08 VITALS — BP 151/64 | HR 68 | Temp 97.7°F | Resp 18 | Ht 64.5 in | Wt 194.2 lb

## 2021-06-08 DIAGNOSIS — Z170 Estrogen receptor positive status [ER+]: Secondary | ICD-10-CM | POA: Diagnosis not present

## 2021-06-08 DIAGNOSIS — C50211 Malignant neoplasm of upper-inner quadrant of right female breast: Secondary | ICD-10-CM | POA: Insufficient documentation

## 2021-06-08 DIAGNOSIS — C50911 Malignant neoplasm of unspecified site of right female breast: Secondary | ICD-10-CM | POA: Insufficient documentation

## 2021-06-08 DIAGNOSIS — R293 Abnormal posture: Secondary | ICD-10-CM | POA: Diagnosis not present

## 2021-06-08 DIAGNOSIS — Z79899 Other long term (current) drug therapy: Secondary | ICD-10-CM | POA: Insufficient documentation

## 2021-06-08 DIAGNOSIS — C50111 Malignant neoplasm of central portion of right female breast: Secondary | ICD-10-CM

## 2021-06-08 LAB — COMPREHENSIVE METABOLIC PANEL
ALT: 69 U/L — ABNORMAL HIGH (ref 0–44)
AST: 67 U/L — ABNORMAL HIGH (ref 15–41)
Albumin: 4.1 g/dL (ref 3.5–5.0)
Alkaline Phosphatase: 81 U/L (ref 38–126)
Anion gap: 7 (ref 5–15)
BUN: 17 mg/dL (ref 8–23)
CO2: 26 mmol/L (ref 22–32)
Calcium: 10 mg/dL (ref 8.9–10.3)
Chloride: 107 mmol/L (ref 98–111)
Creatinine, Ser: 0.96 mg/dL (ref 0.44–1.00)
GFR, Estimated: >60 mL/min (ref >60)
Glucose, Bld: 142 mg/dL — ABNORMAL HIGH (ref 70–99)
Potassium: 3.9 mmol/L (ref 3.5–5.1)
Sodium: 140 mmol/L (ref 135–145)
Total Bilirubin: 0.9 mg/dL (ref 0.3–1.2)
Total Protein: 7.3 g/dL (ref 6.5–8.1)

## 2021-06-08 LAB — CBC WITH DIFFERENTIAL (CANCER CENTER ONLY)
Abs Immature Granulocytes: 0.01 10*3/uL (ref 0.00–0.07)
Basophils Absolute: 0.1 10*3/uL (ref 0.0–0.1)
Basophils Relative: 1 %
Eosinophils Absolute: 0.6 10*3/uL — ABNORMAL HIGH (ref 0.0–0.5)
Eosinophils Relative: 9 %
HCT: 42.6 % (ref 36.0–46.0)
Hemoglobin: 14.3 g/dL (ref 12.0–15.0)
Immature Granulocytes: 0 %
Lymphocytes Relative: 39 %
Lymphs Abs: 2.4 10*3/uL (ref 0.7–4.0)
MCH: 30.6 pg (ref 26.0–34.0)
MCHC: 33.6 g/dL (ref 30.0–36.0)
MCV: 91.2 fL (ref 80.0–100.0)
Monocytes Absolute: 0.5 10*3/uL (ref 0.1–1.0)
Monocytes Relative: 8 %
Neutro Abs: 2.7 10*3/uL (ref 1.7–7.7)
Neutrophils Relative %: 43 %
Platelet Count: 235 10*3/uL (ref 150–400)
RBC: 4.67 MIL/uL (ref 3.87–5.11)
RDW: 12.9 % (ref 11.5–15.5)
WBC Count: 6.3 10*3/uL (ref 4.0–10.5)
nRBC: 0 % (ref 0.0–0.2)

## 2021-06-08 NOTE — Telephone Encounter (Signed)
Called to schedule per 3/21 los ,patient was driving and asked to call us back to schedule  ?

## 2021-06-08 NOTE — Progress Notes (Signed)
?Hematology and Oncology Follow Up Visit ? ?Christie Thomas ?[id number] ?02-06-1956 66 y.o. ?06/08/2021 ? ? ?Principle Diagnosis:  ?Stage Ib (T1cN0M0) infiltrating lobular carcinoma of the right breast -- ER+/PR-/HER2- --Oncotype score = 28 ? ?Current Therapy:   ?status post lumpectomy on 05/17/2021 ?Taxotere/Cytoxan -- start cyle #1/4 on 07/27/2021 ?    ?Interim History:  Christie Thomas is back for follow-up.  She did have a lumpectomy.  This was done on 05/17/2021.  The pathology report ([id number]) showed an invasive labral carcinoma.  It was 1.8 cm.  All margins were negative.  I think 8 lymph nodes were removed and all were negative. ? ?Unfortunately, we did do the Oncotype assay on her.  Her score was 28 which is a lot higher than I would have thought. ? ?She feels okay.  She is recovered from her surgery quite nicely. ? ?She has had no problems with pain.  She has had no cough or shortness of breath.  There is no nausea or vomiting.  There is been no change in bowel or bladder habits.  She will has not noted any swelling with the right arm. ? ?Overall, I would say performance status is probably ECOG is 0. ? ?Medications:  ?Current Outpatient Medications:  ?  AMBULATORY NON FORMULARY MEDICATION, Ultra flora IB -- once daily, Disp: , Rfl:  ?  Cholecalciferol (VITAMIN D PO), Take by mouth., Disp: , Rfl:  ?  clonazePAM (KLONOPIN) 0.5 MG tablet, TAKE 1 TABLET 3 TIMES DAILY AS NEEDED FOR ANXIETY., Disp: 60 tablet, Rfl: 1 ?  fenofibrate 160 MG tablet, TAKE 1 TABLET ONCE DAILY WITH FOOD., Disp: 90 tablet, Rfl: 0 ?  fluticasone (FLONASE) 50 MCG/ACT nasal spray, instill 1 to 2 sprays into each nostril daily, Disp: 16 g, Rfl: 3 ?  ondansetron (ZOFRAN-ODT) 4 MG disintegrating tablet, Take 1 tablet (4 mg total) by mouth every 8 (eight) hours as needed for nausea or vomiting., Disp: 20 tablet, Rfl: 0 ?  oxyCODONE (ROXICODONE) 5 MG immediate release tablet, Take 1 tablet (5 mg total) by mouth every 6 (six) hours as  needed for severe pain., Disp: 15 tablet, Rfl: 0 ?  rosuvastatin (CRESTOR) 10 MG tablet, TAKE 1 TABLET ONCE DAILY., Disp: 90 tablet, Rfl: 0 ?  TURMERIC CURCUMIN PO, Take 500 mg by mouth 2 (two) times daily., Disp: , Rfl:  ?  albuterol (PROVENTIL HFA;VENTOLIN HFA) 108 (90 Base) MCG/ACT inhaler, Inhale 2 puffs into the lungs every 4 (four) hours as needed. (Patient not taking: Reported on 06/08/2021), Disp: 1 Inhaler, Rfl: 6 ? ?Allergies:  ?Allergies  ?Allergen Reactions  ? Iodine Anaphylaxis  ? Penicillins   ?  Throat swelling ?  ? Shellfish Allergy Anaphylaxis  ? Cephalexin   ?  Other reaction(s): Unknown  ? Ciprofloxacin   ?  Other reaction(s): Unknown  ? Metoclopramide Other (See Comments)  ? ? ?Past Medical History, Surgical history, Social history, and Family History were reviewed and updated. ? ?Review of Systems: ?Review of Systems  ?Constitutional: Negative.   ?HENT:  Negative.    ?Eyes: Negative.   ?Respiratory: Negative.    ?Cardiovascular: Negative.   ?Gastrointestinal: Negative.   ?Endocrine: Negative.   ?Genitourinary: Negative.    ?Musculoskeletal: Negative.   ?Skin: Negative.   ?Neurological: Negative.   ?Hematological: Negative.   ?Psychiatric/Behavioral: Negative.    ? ?Physical Exam: ? height is 5' 4.5" (1.638 m) and weight is 194 lb 4 oz (88.1 kg). Her oral temperature is 97.7 ?F (36.5 ?C). Her  blood pressure is 151/64 (abnormal) and her pulse is 68. Her respiration is 18 and oxygen saturation is 100%.  ? ?Wt Readings from Last 3 Encounters:  ?06/08/21 194 lb 4 oz (88.1 kg)  ?05/10/21 193 lb (87.5 kg)  ?05/05/21 193 lb 6.4 oz (87.7 kg)  ? ? ?Physical Exam ?Vitals reviewed.  ?Constitutional:   ?   Comments: Breast exam shows surgical scar with the left breast related to some augmentation.  There is no left axillary adenopathy.  The right breast also has the surgical scar.  This is a healing.  There is a healing right lymphadenectomy scar.  There is no erythema or warmth of either breast.  ?HENT:  ?    Head: Normocephalic and atraumatic.  ?Eyes:  ?   Pupils: Pupils are equal, round, and reactive to light.  ?Cardiovascular:  ?   Rate and Rhythm: Normal rate and regular rhythm.  ?   Heart sounds: Normal heart sounds.  ?Pulmonary:  ?   Effort: Pulmonary effort is normal.  ?   Breath sounds: Normal breath sounds.  ?Abdominal:  ?   General: Bowel sounds are normal.  ?   Palpations: Abdomen is soft.  ?Musculoskeletal:     ?   General: No tenderness or deformity. Normal range of motion.  ?   Cervical back: Normal range of motion.  ?Lymphadenopathy:  ?   Cervical: No cervical adenopathy.  ?Skin: ?   General: Skin is warm and dry.  ?   Findings: No erythema or rash.  ?Neurological:  ?   Mental Status: She is alert and oriented to person, place, and time.  ?Psychiatric:     ?   Behavior: Behavior normal.     ?   Thought Content: Thought content normal.     ?   Judgment: Judgment normal.  ? ? ? ?Lab Results  ?Component Value Date  ? WBC 6.3 06/08/2021  ? HGB 14.3 06/08/2021  ? HCT 42.6 06/08/2021  ? MCV 91.2 06/08/2021  ? PLT 235 06/08/2021  ? ?  Chemistry   ?   ?Component Value Date/Time  ? NA 140 06/08/2021 0906  ? NA 142 11/18/2015 0000  ? K 3.9 06/08/2021 0906  ? CL 107 06/08/2021 0906  ? CO2 26 06/08/2021 0906  ? BUN 17 06/08/2021 0906  ? BUN 17 11/18/2015 0000  ? CREATININE 0.96 06/08/2021 0906  ? CREATININE 0.93 03/03/2021 1117  ? CREATININE 1.03 (H) 06/21/2016 1011  ? GLU 102 11/18/2015 0000  ?    ?Component Value Date/Time  ? CALCIUM 10.0 06/08/2021 0906  ? ALKPHOS 81 06/08/2021 0906  ? AST 67 (H) 06/08/2021 0906  ? AST 61 (H) 03/03/2021 1117  ? ALT 69 (H) 06/08/2021 0906  ? ALT 63 (H) 03/03/2021 1117  ? BILITOT 0.9 06/08/2021 0906  ? BILITOT 0.8 03/03/2021 1117  ?  ? ? ?Impression and Plan: ?Christie Thomas is a very charming 66 year old postmenopausal white female.  She has a stage Ib lobular carcinoma of the right breast.  She clearly has aggressive cancer because of the high Oncotype score. ? ?She would be a  candidate for adjuvant chemotherapy.  I probably would consider Taxotere/Cytoxan.  I like this protocol.  I think this is well-tolerated.  I think is quite effective. ? ?She is also definitely need radiation therapy.  I think with some of the new data that is out there, the amount of radiation that is given is certainly less. ? ?She will also need  antiestrogen therapy. ? ?She is going [place] a [place] in [place].  She does not want to start anything until after she gets back.  I can certainly understand this. ? ?She will need to have a Port-A-Cath placed. ? ?We probably will have to get her in for some chemotherapy teaching. ? ?I would use a 4 cycles of treatment.  I think this would be appropriate. ? ?I would like to see her back when she starts treatment.  Again this probably will be until May.  I can certainly understand this.  She wants to feel good for when she goes over to [place]. ? ? ?Volanda Napoleon, MD ?3/21/20231:42 PM  ?

## 2021-06-08 NOTE — Therapy (Addendum)
[facility] ?[place], [place], [address] ?Phone: [phone]   Fax:  [phone] ? ?Physical Therapy Treatment ? ?Patient Details  ?Name: Christie Thomas ?MRN: [id number] ?Date of Birth: [birth year] ?Referring Provider (PT): Christie Thomas ? ? ?Encounter Date: 06/08/2021 ? ? PT End of Session - 06/08/21 1100   ? ? Visit Number 2   ? Number of Visits 2   ? Date for PT Re-Evaluation 06/08/21   ? PT Start Time 1102   ? PT Stop Time 1147   ? PT Time Calculation (min) 45 min   ? Activity Tolerance Patient tolerated treatment well   ? Behavior During Therapy [facility] for tasks assessed/performed   ? ?  ?  ? ?  ? ? ?Past Medical History:  ?Diagnosis Date  ? Anxiety 01/20/2016  ? Asthma   ? Diverticulitis 01/20/2016  ? Diverticulosis 01/20/2016  ? Epilepsy ([place])   ? Epilepsy ([place]) 01/20/2016  ? Adolescent   ? Fatty liver 01/20/2016  ? GERD (gastroesophageal reflux disease)   ? Hay fever   ? Hyperlipidemia   ? IBS (irritable bowel syndrome) 01/20/2016  ? Obesity 01/20/2016  ? OSA (obstructive sleep apnea) 09/07/2017  ? Osteoarthritis of both hands 01/20/2016  ? PONV (postoperative nausea and vomiting)   ? Swelling of ankle joint, left 01/20/2016  ? Trochanteric bursitis 01/20/2016  ? ? ?Past Surgical History:  ?Procedure Laterality Date  ? AUGMENTATION MAMMAPLASTY    ? BACK SURGERY    ? x2  ? BREAST BIOPSY    ? fatty tumor  ? BREAST ENHANCEMENT SURGERY    ? BREAST LUMPECTOMY WITH RADIOACTIVE SEED AND SENTINEL LYMPH NODE BIOPSY Right 05/17/2021  ? Procedure: RIGHT BREAST LUMPECTOMY WITH RADIOACTIVE SEED;  Surgeon: Christie Kussmaul, MD;  Location: [place];  Service: General;  Laterality: Right;  ? BREAST RECONSTRUCTION Bilateral 05/17/2021  ? Procedure: BREAST RECONSTRUCTION REMOVAL IMPLANTS AND REPLACEMENT OF IMPLANTS;  Surgeon: Christie Going, DO;  Location: [place];  Service: Plastics;  Laterality: Bilateral;  ? BREAST REDUCTION SURGERY Bilateral 05/17/2021  ?  Procedure: ONCOPLASTIC BREAST REDUCTION;  Surgeon: Christie Going, DO;  Location: [place];  Service: Plastics;  Laterality: Bilateral;  ? CARDIAC CATHETERIZATION  07/2010  ? CARPAL TUNNEL RELEASE    ? CATARACT EXTRACTION Bilateral   ? 2023  ? CESAREAN SECTION    ? CHOLECYSTECTOMY    ? HAND SURGERY    ? carpal tunnel release, cyst excision  ? SENTINEL NODE BIOPSY Right 05/17/2021  ? Procedure: RIGHT SENTINEL LYMPH NODE BIOPSY WITH MAGTRACE INJECTION;  Surgeon: Christie Kussmaul, MD;  Location: [facility] [place];  Service: General;  Laterality: Right;  ? SHOULDER SURGERY    ? right  ? TONSILLECTOMY AND ADENOIDECTOMY    ? ? ?There were no vitals filed for this visit. ? ? Subjective Assessment - 06/08/21 1101   ? ? Subjective Pt is here for post op reassessment. SHe is post right lumpectomy with SLNB on 05/17/2021 with removal of old implants and placement of new implants. Had Oncotype test and had a 28 so she is recommended to have chemo, 4 rounds and radiation, and antiestrogens 5-10 years. Shoulder is doing well, but I feel a little line running from my armpit and its a little tender. I may have some localized swelling and I am wearing a compression bra in the day and the binder at night.   ? Pertinent History Pt was diagnosed with right breast  CA by screening mammogram. Biopsy revealed Invasive lobular carcinoma ER+, PR-, Her 2 - with proliferation marker of 1%. pt has breast implants. Pt is now s/p Right lumpectomy with SLNB on 05/17/2021 with removal of ruptured implants  and replacement.   Had a prior right shoulder fx 15 yrs ago and motion has been limited since then   ? Currently in Pain? No/denies   ? Pain Score 0-No pain   ? ?  ?  ? ?  ? ? ? ? ? [facility] PT Assessment - 06/08/21 0001   ? ?  ? Assessment  ? Medical Diagnosis Right breast cancer   ? Referring Provider (PT) Christie Thomas   ? Onset Date/Surgical Date 02/09/21   ? Hand Dominance Right   ?  ? Precautions  ? Precaution Comments lymphedema risk   ?  ? Prior Function  ? Level  of Independence Independent   ?  ? Cognition  ? Overall Cognitive Status Within Functional Limits for tasks assessed   ?  ? Observation/Other Assessments  ? Observations incisions healing nicely, several small stitches visible ;pt sees MD on Friday.  mild redness bilaterally distall to nipple; pt thinks from steristrips.  Thin cord noted in right axilla and running to elbow and forearm but not limiting ROM   ?  ? AROM  ? Right Shoulder Extension 40 Degrees   ? Right Shoulder Flexion 162 Degrees   ? Right Shoulder ABduction 180 Degrees   ? Right Shoulder External Rotation 80 Degrees   ? ?  ?  ? ?  ? ? ? ? LYMPHEDEMA/ONCOLOGY QUESTIONNAIRE - 06/08/21 0001   ? ?  ? Type  ? Cancer Type Invasive Lobular Carcinoma   ?  ? Surgeries  ? Lumpectomy Date 05/17/21   ? Number Lymph Nodes Removed 7   ?  ? Treatment  ? Active Chemotherapy Treatment No   pending  ? Past Chemotherapy Treatment No   ? Active Radiation Treatment --   pending  ? Past Radiation Treatment No   ? Current Hormone Treatment --   pending  ? Past Hormone Therapy Yes   ?  ? What other symptoms do you have  ? Are you Having Heaviness or Tightness --   yes from cording  ? Are you having Pain No   ? Are you having pitting edema No   ? Is it Hard or Difficult finding clothes that fit No   ? Do you have infections No   ? Is there Decreased scar mobility No   ?  ? Right Upper Extremity Lymphedema  ? 10 cm Proximal to Olecranon Process 32.3 cm   ? Olecranon Process 26.4 cm   ? 10 cm Proximal to Ulnar Styloid Process 21.6 cm   ? Just Proximal to Ulnar Styloid Process 15.3 cm   ? At [facility] of 2nd Digit 5.9 cm   ? ?  ?  ? ?  ? ? ? ? ? ? ? ? ? ? ? ? ? ? ? ? ? ? ? ? ? ? PT Education - 06/08/21 1202   ? ? Education Details ABC class, scar massage, SOZO, single arm chest stretch with NTS   ? Person(s) Educated Patient   ? Methods Explanation;Handout   ? Comprehension Verbalized understanding   ? ?  ?  ? ?  ? ? ? ? ? ? PT Long Term Goals - 06/08/21 1158   ? ?  ? PT LONG TERM  GOAL #1  ?  Title Pts right shoulder ROM will return to pre-surgical level for ROM and function   ? Time 8   ? Period Weeks   ? Status Achieved   ? Target Date 06/08/21   ? ?  ?  ? ?  ? ? ? ? ? ? ? ? Plan - 06/08/21 1149   ? ? Clinical Impression Statement Pt was here for post op reassessment after a right lumpectomy with SLNB and removal and replacement of implants on 05/17/2021. Cancer was Invasive lobular Carcinoma ER +, PR- and HER 2 - with Ki 67 of 10%, and Oncotype 28.  Chemo and radiation and anti estrogens have been recommended but pt is still deciding on these.  She has done an excellent job restoring and improving her right shoulder ROM.  She has very mild cording noted that she has been working on and it is not limiting her ROM. There is no increase in circumference of the right UE. Incisions are healing well and there are several small stitiches showing through skin. She has a follow up with MD on Friday. She was scheduled for the ABC class on 06/21/2021 and her next SOZO screen.  She was instructed insingel arm chest stretch with NTS for cording. She was instructed in scar massage to axillary incisions. There are no further PT needs identified at this time.   ? Personal Factors and Comorbidities Comorbidity 3+   ? Comorbidities Right breast CA, lprior limitations in right shoulder ROM from fx and surgery,epilepsy, fatty liver,asthma, diverticulitis   ? Stability/Clinical Decision Making Stable/Uncomplicated   ? Rehab Potential Excellent   ? PT Frequency --   no further needs identified, no more appts scheduled  ? PT Treatment/Interventions Therapeutic exercise;Patient/family education;Scar mobilization   ? PT Next Visit Plan Pt discharged to independent management. Advised to continue ROM throughout radiation. Advised pt to contact us if cording interferes with ROM,or she has any other concerns or questions.   ? Consulted and Agree with Plan of Care Patient   ? ?  ?  ? ?  ? ?PHYSICAL THERAPY DISCHARGE  SUMMARY ? ?Visits from Start of Care: 2 ? ?Current functional level related to goals / functional outcomes: ?Achieved goals ?  ?Remaining deficits: ?Small cord not affecting ROM ?  ?Education / Equipment: ?Compressi

## 2021-06-08 NOTE — Progress Notes (Signed)
START ON PATHWAY REGIMEN - Breast ? ? ?  A cycle is every 21 days: ?    Docetaxel  ?    Cyclophosphamide  ? ?**Always confirm dose/schedule in your pharmacy ordering system** ? ?Patient Characteristics: ?Postoperative without Neoadjuvant Therapy (Pathologic Staging), Invasive Disease, Adjuvant Therapy, HER2 Negative/Unknown/Equivocal, ER Positive, Node Negative, pT1a-c, pN0/N68m or pT2 or Higher, pN0, Oncotype High Risk (? 26) ?Therapeutic Status: Postoperative without Neoadjuvant Therapy (Pathologic Staging) ?AJCC Grade: GX ?AJCC N Category: pNX ?AJCC M Category: cM0 ?ER Status: Positive (+) ?AJCC 8 Stage Grouping: IB ?HER2 Status: Negative (-) ?Oncotype Dx Recurrence Score: 28 ?AJCC T Category: pTX ?PR Status: Negative (-) ?Adjuvant Therapy Status: No Adjuvant Therapy Received Yet or Changing Initial Adjuvant Regimen due to Tolerance ?Has this patient completed genomic testing<= Yes - Oncotype DX(R) ?Intent of Therapy: ?Curative Intent, Discussed with Patient ?

## 2021-06-08 NOTE — Progress Notes (Signed)
Patient her to discuss adjuvant treatment after surgery. Her oncotype score came back at 28. Dr Marin Olp will offer chemotherapy.  ? ?Spoke to patient prior to her MD visit. She has been doing well post surgery. She had family and friends in the home to help with recovery. They have all since returned home. She feels well with no complaints.  ? ?After speaking to patient, she needs to consider her options on whether to proceed with chemo. She has a cruise scheduled for April and states she doesn't want to start anything prior to the cruise. Patient did call back to state that she will proceed with chemo after her vacation return on 07/20/2021. ? ?She will need port, chemo education, and treatment initiation scheduled.  ? ?Oncology Nurse Navigator Documentation ? ?Oncology Nurse Navigator Flowsheets 06/08/2021  ?Abnormal Finding Date -  ?Confirmed Diagnosis Date -  ?Planned Course of Treatment -  ?Phase of Treatment -  ?Expected Surgery Date -  ?Surgery Pending- Reason: -  ?Surgery Actual Start Date: -  ?Navigator Follow Up Date: -  ?Navigator Follow Up Reason: -  ?Navigator Location [facility]  ?Navigator Encounter Type Follow-up Appt;Appt/Treatment Plan Review  ?Telephone -  ?Patient Visit Type MedOnc  ?Treatment Phase Active Tx  ?Barriers/Navigation Needs Coordination of Care;Education  ?Education Other  ?Interventions Coordination of Care;Education;Psycho-Social Support  ?Acuity Level 2-Minimal Needs (1-2 Barriers Identified)  ?Coordination of Care Appts  ?Education Method Verbal;Written  ?Support Groups/Services Friends and Family  ?Time Spent with Patient 15  ?  ? ?

## 2021-06-08 NOTE — Patient Instructions (Signed)
? ? ?   [facility] ? [address], [address] ? [place] [address] ? [phone] ? ?After Breast Cancer Class,  April 3   11:00- 12:00 ?It is recommended you attend the ABC class to be educated on lymphedema risk reduction. This class is free of charge and lasts for 1 hour. It is a 1-time class. You will need to download the Webex app either on your phone or computer. We will send you a link the night before or the morning of the class. You should be able to click on that link to join the class. This is not a confidential class. You don't have to turn your camera on, but other participants may be able to see your email address. ? ?Scar massage ?You can begin gentle scar massage to you incision sites. Gently place one hand on the incision and move the skin (without sliding on the skin) in various directions. Do this for a few minutes and then you can gently massage either coconut oil or vitamin E cream into the scars. ? ?Compression garment ?You should continue wearing your compression bra until you feel like you no longer have swelling. ? ?Home exercise Program ?Continue doing the exercises you were given until you feel like you can do them without feeling any tightness at the end.  ? ?Walking Program ?Studies show that 30 minutes of walking per day (fast enough to elevate your heart rate) can significantly reduce the risk of a cancer recurrence. If you can't walk due to other medical reasons, we encourage you to find another activity you could do (like a stationary bike or water exercise). ? ?Posture ?After breast cancer surgery, people frequently sit with rounded shoulders posture because it puts their incisions on slack and feels better. If you sit like this and scar tissue forms in that position, you can become very tight and have pain sitting or standing with good posture. Try to be aware of your posture and sit and stand up tall to heal properly. ? ?Follow up PT: ?It is recommended you  return every 3 months for the first 3 years following surgery to be assessed on the SOZO machine for an L-Dex score. This helps prevent clinically significant lymphedema in 95% of patients. These follow up screens are 10 minute appointments that you are not billed for. ? ?

## 2021-06-10 ENCOUNTER — Encounter: Payer: Self-pay | Admitting: *Deleted

## 2021-06-10 ENCOUNTER — Ambulatory Visit: Payer: Self-pay | Admitting: General Surgery

## 2021-06-10 NOTE — Progress Notes (Signed)
Patient is scheduled to start treatment on 07/27/2021. Chemo education has also been scheduled for this day. I have reached out to Dr Ethlyn Gallery assistant to follow up on Dr Antonieta Pert request for port placement. I included the patient's availability after her upcoming trip.  ? ?Updated patient to process. She knows to reach out as needed prior to her treatment.  ? ?Oncology Nurse Navigator Documentation ? ? ?  06/10/2021  ?  1:00 PM  ?Oncology Nurse Navigator Flowsheets  ?Planned Course of Treatment Chemotherapy  ?Phase of Treatment Chemo  ?Chemotherapy Pending- Reason: Patient Request/Initiated  ?Navigator Follow Up Date: 07/27/2021  ?Navigator Follow Up Reason: Chemotherapy;Chemo Class  ?Navigator Location [facility]  ?Navigator Encounter Type Appt/Treatment Plan Review  ?Patient Visit Type MedOnc  ?Treatment Phase Active Tx  ?Barriers/Navigation Needs Coordination of Care;Education  ?Education Other  ?Interventions Coordination of Care;Education  ?Acuity Level 2-Minimal Needs (1-2 Barriers Identified)  ?Coordination of Care Other  ?Education Method Written  ?Support Groups/Services Friends and Family  ?Time Spent with Patient 30  ?  ?

## 2021-06-11 ENCOUNTER — Ambulatory Visit (INDEPENDENT_AMBULATORY_CARE_PROVIDER_SITE_OTHER): Payer: BC Managed Care – PPO | Admitting: Surgical

## 2021-06-11 ENCOUNTER — Encounter: Payer: Self-pay | Admitting: Surgical

## 2021-06-11 ENCOUNTER — Other Ambulatory Visit: Payer: Self-pay

## 2021-06-11 DIAGNOSIS — C50211 Malignant neoplasm of upper-inner quadrant of right female breast: Secondary | ICD-10-CM

## 2021-06-11 DIAGNOSIS — Z170 Estrogen receptor positive status [ER+]: Secondary | ICD-10-CM

## 2021-06-11 DIAGNOSIS — L989 Disorder of the skin and subcutaneous tissue, unspecified: Secondary | ICD-10-CM

## 2021-06-11 NOTE — Progress Notes (Signed)
66 year old female here for follow-up after bilateral oncoplastic breast reduction with Dr. Marla Roe on 05/17/2021.  She had removal of bilateral ruptured silicone implants, placement of new breast implants and bilateral mastopexy at the same time as a partial mastectomy of the right breast. ? ?She reports she is overall doing really well.  She has had some irritation of bilateral breast, suspect this might be related to the Steri-Strips that were placed at her last visit.  These are all off now. ?She reports she is having a Port-A-Cath placed on May 8, May 9 she is starting chemo for 4 rounds every 3 weeks.  She is then going to have radiation to the right breast. ? ?She also mentions she has a lesion on her right foot, she also had one on her left foot, however this 1 had fallen off.  No history of skin cancer.  It does not appear to be a melanoma, squamous cell or basal cell, however given her history and the changing appearance, recommend excision/biopsy. ? ?Chaperone present on exam ?On exam bilateral NAC's are viable, bilateral breasts are symmetric.  No erythema or cellulitic changes noted.  Some dried skin is noted.  No subcutaneous fluid collection noted palpation. ? ?On exam of her right foot, she has a 2 x 2 millimeter pedunculated lesion.  ? ?Recommend following up for reevaluation of bilateral breast in 6 months after completion of radiation and chemotherapy. ? ?Recommend biopsy of right foot skin lesion, discussed with patient we could do this in the clinic under local anesthetic.  Recommend letting us know if she notices any drastic changes in the lesion, however I think we should wait until after completing chemotherapy to excise.  Will discuss with Dr. Marla Roe and schedule. ? ?Pictures were obtained of the patient and placed in the chart with the patient's or guardian's permission. ? ? ?

## 2021-07-16 ENCOUNTER — Other Ambulatory Visit: Payer: Self-pay | Admitting: *Deleted

## 2021-07-16 DIAGNOSIS — C50211 Malignant neoplasm of upper-inner quadrant of right female breast: Secondary | ICD-10-CM

## 2021-07-16 MED ORDER — LIDOCAINE-PRILOCAINE 2.5-2.5 % EX CREA: TOPICAL_CREAM | CUTANEOUS | 3 refills | Status: DC

## 2021-07-16 MED ORDER — DEXAMETHASONE 4 MG PO TABS: 8.0000 mg | ORAL_TABLET | Freq: Two times a day (BID) | ORAL | 1 refills | Status: AC

## 2021-07-16 MED ORDER — PROCHLORPERAZINE MALEATE 10 MG PO TABS: 10.0000 mg | ORAL_TABLET | Freq: Four times a day (QID) | ORAL | 1 refills | Status: AC | PRN

## 2021-07-16 MED ORDER — ONDANSETRON HCL 8 MG PO TABS: 8.0000 mg | ORAL_TABLET | Freq: Two times a day (BID) | ORAL | 1 refills | Status: AC | PRN

## 2021-07-20 ENCOUNTER — Other Ambulatory Visit: Payer: Self-pay

## 2021-07-20 ENCOUNTER — Telehealth: Payer: Self-pay

## 2021-07-20 DIAGNOSIS — E785 Hyperlipidemia, unspecified: Secondary | ICD-10-CM

## 2021-07-20 MED ORDER — ROSUVASTATIN CALCIUM 10 MG PO TABS: 10.0000 mg | ORAL_TABLET | Freq: Every day | ORAL | 0 refills | Status: DC

## 2021-07-20 NOTE — Progress Notes (Signed)
Pharmacist Chemotherapy Monitoring - Initial Assessment   ? ?Anticipated start date: 07/27/21  ? ?The following has been reviewed per standard work regarding the patient's treatment regimen: ?The patient's diagnosis, treatment plan and drug doses, and organ/hematologic function ?Lab orders and baseline tests specific to treatment regimen  ?The treatment plan start date, drug sequencing, and pre-medications ?Prior authorization status  ?Patient's documented medication list, including drug-drug interaction screen and prescriptions for anti-emetics and supportive care specific to the treatment regimen ?The drug concentrations, fluid compatibility, administration routes, and timing of the medications to be used ?The patient's access for treatment and lifetime cumulative dose history, if applicable  ?The patient's medication allergies and previous infusion related reactions, if applicable  ? ?Changes made to treatment plan:  ?N/A ? ?Follow up needed:  ?N/A ? ? ?J[place], Leon, [facility], ?07/20/2021  7:48 AM  ?

## 2021-07-20 NOTE — Telephone Encounter (Signed)
MEDICATION: rosuvastatin (CRESTOR) 10 MG tablet ? ?PHARMACY: [facility] [id number] - [place] [address], [place] ? ?Comments: Patient is completely out.  ? ?**Let patient know to contact pharmacy at the end of the day to make sure medication is ready. ** ? ?** Please notify patient to allow 48-72 hours to process** ? ?**Encourage patient to contact the pharmacy for refills or they can request refills through [facility]** ? ? ?

## 2021-07-21 ENCOUNTER — Encounter (HOSPITAL_COMMUNITY): Payer: Self-pay | Admitting: Surgery

## 2021-07-21 NOTE — Progress Notes (Signed)
Surgical Instructions ? ? ? Your procedure is scheduled on 07/26/21. ? Report to [facility] Main Entrance "A" at 6:30 A.M., then check in with the Admitting office. ? Call this number if you have problems the morning of surgery: ? [phone] ? ? If you have any questions prior to your surgery date call [phone]: Open Monday-Friday 8am-4pm ? ? ? Remember: ? Do not eat after midnight the night before your surgery ? ?You may drink clear liquids until 5:30 the morning of your surgery.   ?Clear liquids allowed are: Water, Non-Citrus Juices (without pulp), Carbonated Beverages, Clear Tea, Black Coffee ONLY (NO MILK, CREAM OR POWDERED CREAMER of any kind), and Gatorade ?  ? Take these medicines the morning of surgery with A SIP OF WATER:  ?rosuvastatin (CRESTOR) ? ?AS NEEDED: ?albuterol (PROVENTIL HFA;VENTOLIN HFA)  ?beclomethasone (QVAR) ?clonazePAM (KLONOPIN) ?fluticasone (FLONASE) ?Polyethyl Glycol-Propyl Glycol (SYSTANE OP) ?prochlorperazine (COMPAZINE) ? ? ?As of today, STOP taking any Aspirin (unless otherwise instructed by your surgeon) Aleve, Naproxen, Ibuprofen, Motrin, Advil, Goody's, BC's, all herbal medications, fish oil, and all vitamins. ? ?         ?Do not wear jewelry or makeup ?Do not wear lotions, powders, perfumes or deodorant. ?Do not shave 48 hours prior to surgery.  ?Do not bring valuables to the hospital. ?Do not wear nail polish, gel polish, artificial nails, or any other type of covering on natural nails (fingers and toes) ?If you have artificial nails or gel coating that need to be removed by a nail salon, please have this removed prior to surgery. Artificial nails or gel coating may interfere with anesthesia's ability to adequately monitor your vital signs. ? ?[facility] is not responsible for any belongings or valuables. .  ? ?Do NOT Smoke (Tobacco/Vaping)  24 hours prior to your procedure ? ?If you use a CPAP at night, you may bring your mask for your overnight stay. ?  ?Contacts, glasses,  hearing aids, dentures or partials may not be worn into surgery, please bring cases for these belongings ?  ?For patients admitted to the hospital, discharge time will be determined by your treatment team. ?  ?Patients discharged the day of surgery will not be allowed to drive home, and someone needs to stay with them for 24 hours. ? ? ?SURGICAL WAITING ROOM VISITATION ?Patients having surgery or a procedure in a hospital may have two support people. ?Children under the age of 43 must have an adult with them who is not the patient. ?They may stay in the waiting area during the procedure and may switch out with other visitors. If the patient needs to stay at the hospital during part of their recovery, the visitor guidelines for inpatient rooms apply. ? ?Please refer to the [facility] website for the visitor guidelines for Inpatients (after your surgery is over and you are in a regular room).  ? ? ? ?Special instructions:   ? ?Oral Hygiene is also important to reduce your risk of infection.  Remember - BRUSH YOUR TEETH THE MORNING OF SURGERY WITH YOUR REGULAR TOOTHPASTE ? ? ?[facility]- Preparing For Surgery ? ?Before surgery, you can play an important role. Because skin is not sterile, your skin needs to be as free of germs as possible. You can reduce the number of germs on your skin by washing with CHG (chlorahexidine gluconate) Soap before surgery.  CHG is an antiseptic cleaner which kills germs and bonds with the skin to continue killing germs even after washing.   ? ? ?  Please do not use if you have an allergy to CHG or antibacterial soaps. If your skin becomes reddened/irritated stop using the CHG.  ?Do not shave (including legs and underarms) for at least 48 hours prior to first CHG shower. It is OK to shave your face. ? ?Please follow these instructions carefully. ?  ? ? Shower the NIGHT BEFORE SURGERY and the MORNING OF SURGERY with CHG Soap.  ? If you chose to wash your hair, wash your hair first as usual  with your normal shampoo. After you shampoo, rinse your hair and body thoroughly to remove the shampoo.  Then [employer] and genitals (private parts) with your normal soap and rinse thoroughly to remove soap. ? ?After that Use CHG Soap as you would any other liquid soap. You can apply CHG directly to the skin and wash gently with a scrungie or a clean washcloth.  ? ?Apply the CHG Soap to your body ONLY FROM THE NECK DOWN.  Do not use on open wounds or open sores. Avoid contact with your eyes, ears, mouth and genitals (private parts). Wash Face and genitals (private parts)  with your normal soap.  ? ?Wash thoroughly, paying special attention to the area where your surgery will be performed. ? ?Thoroughly rinse your body with warm water from the neck down. ? ?DO NOT shower/wash with your normal soap after using and rinsing off the CHG Soap. ? ?Pat yourself dry with a CLEAN TOWEL. ? ?Wear CLEAN PAJAMAS to bed the night before surgery ? ?Place CLEAN SHEETS on your bed the night before your surgery ? ?DO NOT SLEEP WITH PETS. ? ? ?Day of Surgery: ? ?Take a shower with CHG soap. ?Wear Clean/Comfortable clothing the morning of surgery ?Do not apply any deodorants/lotions.   ?Remember to brush your teeth WITH YOUR REGULAR TOOTHPASTE. ? ? ? ?If you received a COVID test during your pre-op visit, it is requested that you wear a mask when out in public, stay away from anyone that may not be feeling well, and notify your surgeon if you develop symptoms. If you have been in contact with anyone that has tested positive in the last 10 days, please notify your surgeon. ? ?  ?Please read over the following fact sheets that you were given.   ?

## 2021-07-22 ENCOUNTER — Other Ambulatory Visit: Payer: Self-pay

## 2021-07-22 ENCOUNTER — Encounter (HOSPITAL_COMMUNITY)
Admission: RE | Admit: 2021-07-22 | Discharge: 2021-07-22 | Disposition: A | Discharge status: Home / Self Care | Payer: BC Managed Care – PPO | Source: Ambulatory Visit | Attending: General Surgery | Admitting: General Surgery

## 2021-07-22 ENCOUNTER — Encounter (HOSPITAL_COMMUNITY): Payer: Self-pay

## 2021-07-22 VITALS — BP 142/74 | HR 60 | Temp 98.4°F | Resp 17 | Ht 64.0 in | Wt 192.9 lb

## 2021-07-22 DIAGNOSIS — Z01818 Encounter for other preprocedural examination: Secondary | ICD-10-CM

## 2021-07-22 DIAGNOSIS — K760 Fatty (change of) liver, not elsewhere classified: Secondary | ICD-10-CM | POA: Diagnosis not present

## 2021-07-22 DIAGNOSIS — Z01812 Encounter for preprocedural laboratory examination: Secondary | ICD-10-CM | POA: Insufficient documentation

## 2021-07-22 LAB — CBC
HCT: 45 % (ref 36.0–46.0)
Hemoglobin: 15 g/dL (ref 12.0–15.0)
MCH: 30.5 pg (ref 26.0–34.0)
MCHC: 33.3 g/dL (ref 30.0–36.0)
MCV: 91.5 fL (ref 80.0–100.0)
Platelets: 254 10*3/uL (ref 150–400)
RBC: 4.92 MIL/uL (ref 3.87–5.11)
RDW: 12.9 % (ref 11.5–15.5)
WBC: 7 10*3/uL (ref 4.0–10.5)
nRBC: 0 % (ref 0.0–0.2)

## 2021-07-22 LAB — COMPREHENSIVE METABOLIC PANEL
ALT: 66 U/L — ABNORMAL HIGH (ref 0–44)
AST: 71 U/L — ABNORMAL HIGH (ref 15–41)
Albumin: 3.7 g/dL (ref 3.5–5.0)
Alkaline Phosphatase: 82 U/L (ref 38–126)
Anion gap: 7 (ref 5–15)
BUN: 15 mg/dL (ref 8–23)
CO2: 22 mmol/L (ref 22–32)
Calcium: 10 mg/dL (ref 8.9–10.3)
Chloride: 110 mmol/L (ref 98–111)
Creatinine, Ser: 0.95 mg/dL (ref 0.44–1.00)
GFR, Estimated: >60 mL/min (ref >60)
Glucose, Bld: 97 mg/dL (ref 70–99)
Potassium: 3.9 mmol/L (ref 3.5–5.1)
Sodium: 139 mmol/L (ref 135–145)
Total Bilirubin: 0.8 mg/dL (ref 0.3–1.2)
Total Protein: 7.3 g/dL (ref 6.5–8.1)

## 2021-07-22 NOTE — Progress Notes (Signed)
Pharmacist Chemotherapy Monitoring - Initial Assessment   ? ?Anticipated start date: 07/27/21  ? ?The following has been reviewed per standard work regarding the patient's treatment regimen: ?The patient's diagnosis, treatment plan and drug doses, and organ/hematologic function ?Lab orders and baseline tests specific to treatment regimen  ?The treatment plan start date, drug sequencing, and pre-medications ?Prior authorization status  ?Patient's documented medication list, including drug-drug interaction screen and prescriptions for anti-emetics and supportive care specific to the treatment regimen ?The drug concentrations, fluid compatibility, administration routes, and timing of the medications to be used ?The patient's access for treatment and lifetime cumulative dose history, if applicable  ?The patient's medication allergies and previous infusion related reactions, if applicable  ? ?Changes made to treatment plan:  ?N/A ? ?Follow up needed:  ?N/A ? ? ?Christie Thomas, Christie Thomas, [facility], ?07/22/2021  8:24 AM  ?

## 2021-07-22 NOTE — Progress Notes (Signed)
PCP - Dr. Annye Asa ?Cardiologist - denies ? ?PPM/ICD - denies ? ?Chest x-ray - 08/22/16 ?EKG - 10+ years ago per pt, normal ?Stress Test - 15-20 years ago per pt, no abnormalities ?ECHO - denies ?Cardiac Cath -  07/2010, pt states it was completely normal. Cath done d/t CP which was eventually ruled as asthma exacerbation. She has not had any episodes since then nor has she followed up with a cardiologist since then.  ? ?Sleep Study - 6 years ago per pt, OSA+ ?CPAP - not needed d/t weight loss ? ?DM- denies ? ?ASA/Blood Thinner Instructions: n/a ? ? ?ERAS Protcol - yes, no drink ? ? ?COVID TEST- n/a, pt was positive on cruise to [place] around 4/18 or 4/19 ? ? ?Anesthesia review: no ? ?Patient denies shortness of breath, fever, cough and chest pain at PAT appointment ? ? ?All instructions explained to the patient, with a verbal understanding of the material. Patient agrees to go over the instructions while at home for a better understanding. Patient also instructed to notify surgeon of any contact with COVID+ person or if she develops any symptoms. The opportunity to ask questions was provided. ?  ?

## 2021-07-25 NOTE — Anesthesia Preprocedure Evaluation (Addendum)
Anesthesia Evaluation  ?Patient identified by MRN, date of birth, ID band ?Patient awake ? ? ? ?Reviewed: ?Allergy & Precautions, NPO status , Patient's Chart, lab work & pertinent test results ? ?History of Anesthesia Complications ?(+) PONV and history of anesthetic complications ? ?Airway ?Mallampati: II ? ?TM Distance: >3 FB ?Neck ROM: Full ? ? ? Dental ? ?(+) Teeth Intact, Dental Advisory Given ?  ?Pulmonary ?asthma , sleep apnea ,  ?  ?Pulmonary exam normal ?breath sounds clear to auscultation ? ? ? ? ? ? Cardiovascular ?Exercise Tolerance: Good ?(-) anginanegative cardio ROS ?Normal cardiovascular exam ?Rhythm:Regular Rate:Normal ? ? ?  ?Neuro/Psych ?Seizures -, Well Controlled,  PSYCHIATRIC DISORDERS Anxiety   ? GI/Hepatic ?Neg liver ROS, GERD  ,  ?Endo/Other  ?Obesity ? ? Renal/GU ?negative Renal ROS  ? ?  ?Musculoskeletal ? ?(+) Arthritis ,  ? Abdominal ?  ?Peds ? Hematology ?negative hematology ROS ?(+)   ?Anesthesia Other Findings ?H/o Right breast cancer ? ? Reproductive/Obstetrics ? ?  ? ? ? ? ? ? ? ? ? ? ? ? ? ?  ?  ? ? ? ? ? ? ? ?Anesthesia Physical ?Anesthesia Plan ? ?ASA: 2 ? ?Anesthesia Plan: General  ? ?Post-op Pain Management: Tylenol PO (pre-op)*  ? ?Induction: Intravenous ? ?PONV Risk Score and Plan: 4 or greater and Midazolam, Dexamethasone, Ondansetron and Treatment may vary due to age or medical condition ? ?Airway Management Planned: LMA ? ?Additional Equipment:  ? ?Intra-op Plan:  ? ?Post-operative Plan: Extubation in OR ? ?Informed Consent: I have reviewed the patients History and Physical, chart, labs and discussed the procedure including the risks, benefits and alternatives for the proposed anesthesia with the patient or authorized representative who has indicated his/her understanding and acceptance.  ? ? ? ?Dental advisory given ? ?Plan Discussed with: CRNA ? ?Anesthesia Plan Comments:   ? ? ? ? ? ?Anesthesia Quick Evaluation ? ?

## 2021-07-26 ENCOUNTER — Ambulatory Visit (HOSPITAL_COMMUNITY): Payer: BC Managed Care – PPO

## 2021-07-26 ENCOUNTER — Ambulatory Visit (HOSPITAL_COMMUNITY)
Admission: RE | Admit: 2021-07-26 | Discharge: 2021-07-26 | Disposition: A | Discharge status: Home / Self Care | Payer: BC Managed Care – PPO | Source: Ambulatory Visit | Attending: General Surgery | Admitting: General Surgery

## 2021-07-26 ENCOUNTER — Ambulatory Visit (HOSPITAL_COMMUNITY): Payer: BC Managed Care – PPO | Admitting: Certified Registered Nurse Anesthetist

## 2021-07-26 ENCOUNTER — Other Ambulatory Visit: Payer: Self-pay

## 2021-07-26 ENCOUNTER — Encounter (HOSPITAL_COMMUNITY)
Admission: RE | Disposition: A | Discharge status: Home / Self Care | Payer: Self-pay | Source: Ambulatory Visit | Attending: General Surgery

## 2021-07-26 ENCOUNTER — Encounter (HOSPITAL_COMMUNITY): Payer: Self-pay | Admitting: General Surgery

## 2021-07-26 DIAGNOSIS — Z170 Estrogen receptor positive status [ER+]: Secondary | ICD-10-CM | POA: Insufficient documentation

## 2021-07-26 DIAGNOSIS — C50211 Malignant neoplasm of upper-inner quadrant of right female breast: Secondary | ICD-10-CM | POA: Diagnosis not present

## 2021-07-26 DIAGNOSIS — C50911 Malignant neoplasm of unspecified site of right female breast: Secondary | ICD-10-CM | POA: Diagnosis not present

## 2021-07-26 DIAGNOSIS — Z452 Encounter for adjustment and management of vascular access device: Secondary | ICD-10-CM | POA: Diagnosis not present

## 2021-07-26 HISTORY — PX: PORTACATH PLACEMENT: SHX2246

## 2021-07-26 SURGERY — INSERTION, TUNNELED CENTRAL VENOUS DEVICE, WITH PORT
Anesthesia: General | Site: Chest

## 2021-07-26 MED ORDER — LIDOCAINE 2% (20 MG/ML) 5 ML SYRINGE
INTRAMUSCULAR | Status: DC | PRN
  Administered 2021-07-26: 80 mg via INTRAVENOUS

## 2021-07-26 MED ORDER — PROPOFOL 10 MG/ML IV BOLUS
INTRAVENOUS | Status: AC
  Filled 2021-07-26: qty 20

## 2021-07-26 MED ORDER — VANCOMYCIN HCL IN DEXTROSE 1-5 GM/200ML-% IV SOLN
1000.0000 mg | INTRAVENOUS | Status: AC
Start: 2021-07-26 — End: 2021-07-26
  Administered 2021-07-26: 1000 mg via INTRAVENOUS
  Filled 2021-07-26: qty 200

## 2021-07-26 MED ORDER — FENTANYL CITRATE (PF) 100 MCG/2ML IJ SOLN
INTRAMUSCULAR | Status: AC
  Filled 2021-07-26: qty 2

## 2021-07-26 MED ORDER — HEPARIN SOD (PORK) LOCK FLUSH 100 UNIT/ML IV SOLN
INTRAVENOUS | Status: AC
  Filled 2021-07-26: qty 5

## 2021-07-26 MED ORDER — DEXAMETHASONE SODIUM PHOSPHATE 10 MG/ML IJ SOLN
INTRAMUSCULAR | Status: DC | PRN
  Administered 2021-07-26: 10 mg via INTRAVENOUS

## 2021-07-26 MED ORDER — LIDOCAINE 2% (20 MG/ML) 5 ML SYRINGE
INTRAMUSCULAR | Status: AC
  Filled 2021-07-26: qty 5

## 2021-07-26 MED ORDER — HEPARIN SOD (PORK) LOCK FLUSH 100 UNIT/ML IV SOLN
INTRAVENOUS | Status: DC | PRN
  Administered 2021-07-26: 500 [IU] via INTRAVENOUS

## 2021-07-26 MED ORDER — BUPIVACAINE HCL (PF) 0.25 % IJ SOLN
INTRAMUSCULAR | Status: AC
  Filled 2021-07-26: qty 30

## 2021-07-26 MED ORDER — CHLORHEXIDINE GLUCONATE CLOTH 2 % EX PADS
6.0000 | MEDICATED_PAD | Freq: Once | CUTANEOUS | Status: DC
Start: 2021-07-26 — End: 2021-07-26

## 2021-07-26 MED ORDER — HEPARIN 6000 UNIT IRRIGATION SOLUTION
Status: DC | PRN
  Administered 2021-07-26: 1

## 2021-07-26 MED ORDER — OXYCODONE HCL 5 MG PO TABS: 5.0000 mg | ORAL_TABLET | Freq: Four times a day (QID) | ORAL | 0 refills | Status: DC | PRN

## 2021-07-26 MED ORDER — ONDANSETRON HCL 4 MG/2ML IJ SOLN
INTRAMUSCULAR | Status: AC
  Filled 2021-07-26: qty 2

## 2021-07-26 MED ORDER — CHLORHEXIDINE GLUCONATE 0.12 % MT SOLN
15.0000 mL | Freq: Once | OROMUCOSAL | Status: AC
  Administered 2021-07-26: 15 mL via OROMUCOSAL
  Filled 2021-07-26: qty 15

## 2021-07-26 MED ORDER — ACETAMINOPHEN 500 MG PO TABS
1000.0000 mg | ORAL_TABLET | ORAL | Status: AC
  Administered 2021-07-26: 1000 mg via ORAL
  Filled 2021-07-26: qty 2

## 2021-07-26 MED ORDER — FENTANYL CITRATE (PF) 250 MCG/5ML IJ SOLN
INTRAMUSCULAR | Status: DC | PRN
  Administered 2021-07-26 (×2): 25 ug via INTRAVENOUS

## 2021-07-26 MED ORDER — LACTATED RINGERS IV SOLN: INTRAVENOUS | Status: DC

## 2021-07-26 MED ORDER — HEPARIN 6000 UNIT IRRIGATION SOLUTION
Status: AC
  Filled 2021-07-26: qty 500

## 2021-07-26 MED ORDER — MIDAZOLAM HCL 2 MG/2ML IJ SOLN
INTRAMUSCULAR | Status: DC | PRN
Start: 2021-07-26 — End: 2021-07-26
  Administered 2021-07-26: 2 mg via INTRAVENOUS

## 2021-07-26 MED ORDER — PROPOFOL 10 MG/ML IV BOLUS
INTRAVENOUS | Status: DC | PRN
Start: 2021-07-26 — End: 2021-07-26
  Administered 2021-07-26: 160 mg via INTRAVENOUS

## 2021-07-26 MED ORDER — ONDANSETRON HCL 4 MG/2ML IJ SOLN
INTRAMUSCULAR | Status: DC | PRN
  Administered 2021-07-26: 4 mg via INTRAVENOUS

## 2021-07-26 MED ORDER — 0.9 % SODIUM CHLORIDE (POUR BTL) OPTIME
TOPICAL | Status: DC | PRN
  Administered 2021-07-26: 1000 mL

## 2021-07-26 MED ORDER — ONDANSETRON HCL 4 MG/2ML IJ SOLN: 4.0000 mg | Freq: Once | INTRAMUSCULAR | Status: DC | PRN

## 2021-07-26 MED ORDER — FENTANYL CITRATE (PF) 250 MCG/5ML IJ SOLN
INTRAMUSCULAR | Status: AC
  Filled 2021-07-26: qty 5

## 2021-07-26 MED ORDER — ORAL CARE MOUTH RINSE: 15.0000 mL | Freq: Once | OROMUCOSAL | Status: AC

## 2021-07-26 MED ORDER — GABAPENTIN 300 MG PO CAPS
300.0000 mg | ORAL_CAPSULE | ORAL | Status: AC
  Administered 2021-07-26: 300 mg via ORAL
  Filled 2021-07-26: qty 1

## 2021-07-26 MED ORDER — BUPIVACAINE HCL (PF) 0.25 % IJ SOLN
INTRAMUSCULAR | Status: DC | PRN
  Administered 2021-07-26: 7 mL

## 2021-07-26 MED ORDER — DEXAMETHASONE SODIUM PHOSPHATE 10 MG/ML IJ SOLN
INTRAMUSCULAR | Status: AC
  Filled 2021-07-26: qty 1

## 2021-07-26 MED ORDER — MIDAZOLAM HCL 2 MG/2ML IJ SOLN
INTRAMUSCULAR | Status: AC
  Filled 2021-07-26: qty 2

## 2021-07-26 MED ORDER — CELECOXIB 200 MG PO CAPS
200.0000 mg | ORAL_CAPSULE | ORAL | Status: AC
Start: 2021-07-26 — End: 2021-07-26
  Administered 2021-07-26: 200 mg via ORAL
  Filled 2021-07-26: qty 1

## 2021-07-26 MED ORDER — CHLORHEXIDINE GLUCONATE CLOTH 2 % EX PADS: 6.0000 | MEDICATED_PAD | Freq: Once | CUTANEOUS | Status: DC

## 2021-07-26 MED ORDER — FENTANYL CITRATE (PF) 100 MCG/2ML IJ SOLN
25.0000 ug | INTRAMUSCULAR | Status: DC | PRN
  Administered 2021-07-26: 25 ug via INTRAVENOUS

## 2021-07-26 SURGICAL SUPPLY — 38 items
ADH SKN CLS APL DERMABOND .7 (GAUZE/BANDAGES/DRESSINGS) ×1
APL PRP STRL LF DISP 70% ISPRP (MISCELLANEOUS) ×1
BAG COUNTER SPONGE SURGICOUNT (BAG) ×2 IMPLANT
BAG DECANTER FOR FLEXI CONT (MISCELLANEOUS) ×2 IMPLANT
BAG SPNG CNTER NS LX DISP (BAG) ×1
CHLORAPREP W/TINT 10.5 ML (MISCELLANEOUS) ×1 IMPLANT
CHLORAPREP W/TINT 26 (MISCELLANEOUS) ×1 IMPLANT
COVER SURGICAL LIGHT HANDLE (MISCELLANEOUS) ×2 IMPLANT
COVER TRANSDUCER ULTRASND GEL (DISPOSABLE) ×1 IMPLANT
DECANTER SPIKE VIAL GLASS SM (MISCELLANEOUS) ×2 IMPLANT
DERMABOND ADVANCED (GAUZE/BANDAGES/DRESSINGS) ×1
DERMABOND ADVANCED .7 DNX12 (GAUZE/BANDAGES/DRESSINGS) ×1 IMPLANT
DRAPE C-ARM 42X120 X-RAY (DRAPES) ×2 IMPLANT
ELECT CAUTERY BLADE 6.4 (BLADE) ×2 IMPLANT
ELECT REM PT RETURN 9FT ADLT (ELECTROSURGICAL) ×2
ELECTRODE REM PT RTRN 9FT ADLT (ELECTROSURGICAL) ×1 IMPLANT
GEL ULTRASOUND 20GR AQUASONIC (MISCELLANEOUS) IMPLANT
GLOVE BIO SURGEON STRL SZ7.5 (GLOVE) ×2 IMPLANT
GOWN STRL REUS W/ TWL LRG LVL3 (GOWN DISPOSABLE) ×2 IMPLANT
GOWN STRL REUS W/TWL LRG LVL3 (GOWN DISPOSABLE) ×4
KIT BASIN OR (CUSTOM PROCEDURE TRAY) ×2 IMPLANT
KIT PORT POWER 8FR ISP CVUE (Port) ×1 IMPLANT
KIT TURNOVER KIT B (KITS) ×2 IMPLANT
NEEDLE 22X1 1/2 (OR ONLY) (NEEDLE) ×1 IMPLANT
NS IRRIG 1000ML POUR BTL (IV SOLUTION) ×2 IMPLANT
PAD ARMBOARD 7.5X6 YLW CONV (MISCELLANEOUS) ×2 IMPLANT
PENCIL BUTTON HOLSTER BLD 10FT (ELECTRODE) ×2 IMPLANT
POSITIONER HEAD DONUT 9IN (MISCELLANEOUS) ×2 IMPLANT
SHEATH COOK PEEL AWAY SET 9F (SHEATH) IMPLANT
SUT MNCRL AB 4-0 PS2 18 (SUTURE) ×2 IMPLANT
SUT PROLENE 2 0 SH 30 (SUTURE) ×2 IMPLANT
SUT VIC AB 3-0 SH 27 (SUTURE) ×2
SUT VIC AB 3-0 SH 27XBRD (SUTURE) ×1 IMPLANT
SYR 10ML LL (SYRINGE) IMPLANT
SYR 5ML LUER SLIP (SYRINGE) ×2 IMPLANT
TOWEL GREEN STERILE (TOWEL DISPOSABLE) ×2 IMPLANT
TOWEL GREEN STERILE FF (TOWEL DISPOSABLE) ×2 IMPLANT
TRAY LAPAROSCOPIC MC (CUSTOM PROCEDURE TRAY) ×2 IMPLANT

## 2021-07-26 NOTE — Transfer of Care (Signed)
Immediate Anesthesia Transfer of Care Note ? ?Patient: Christie Thomas ? ?Procedure(s) Performed: INSERTION PORT-A-CATH (Chest) ? ?Patient Location: PACU ? ?Anesthesia Type:General ? ?Level of Consciousness: awake, patient cooperative and responds to stimulation ? ?Airway & Oxygen Therapy: Patient Spontanous Breathing ? ?Post-op Assessment: Report given to RN and Post -op Vital signs reviewed and stable ? ?Post vital signs: Reviewed and stable ? ?Last Vitals:  ?Vitals Value Taken Time  ?BP 132/74 07/26/21 0922  ?Temp    ?Pulse 75 07/26/21 0924  ?Resp 25 07/26/21 0924  ?SpO2 100 % 07/26/21 0924  ?Vitals shown include unvalidated device data. ? ?Last Pain:  ?Vitals:  ? 07/26/21 0730  ?TempSrc:   ?PainSc: 0-No pain  ?   ? ?  ? ?Complications: No notable events documented. ?

## 2021-07-26 NOTE — Op Note (Signed)
07/26/2021 ? ?9:17 AM ? ?PATIENT:  Christie Thomas  66 y.o. female ? ?PRE-OPERATIVE DIAGNOSIS:  RIGHT BREAST CANCER ? ?POST-OPERATIVE DIAGNOSIS:  RIGHT BREAST CANCER ? ?PROCEDURE:  Procedure(s): ?INSERTION PORT-A-CATH (N/A) ? ?SURGEON:  Surgeon(s) and Role: ?   Jovita Kussmaul, MD - Primary ? ?PHYSICIAN ASSISTANT:  ? ?ASSISTANTS: none  ? ?ANESTHESIA:   local and general ? ?EBL:  minimal  ? ?BLOOD ADMINISTERED:none ? ?DRAINS: none  ? ?LOCAL MEDICATIONS USED:  MARCAINE    ? ?SPECIMEN:  No Specimen ? ?DISPOSITION OF SPECIMEN:  N/A ? ?COUNTS:  YES ? ?TOURNIQUET:  * No tourniquets in log * ? ?DICTATION: .Dragon Dictation ? ?After informed consent was obtained the patient was brought to the operating room and placed in the supine position on the operating table.  After adequate induction of general anesthesia a roll was placed between the patient's shoulder blades to extend the shoulder slightly.  The left chest and neck area were then prepped with ChloraPrep, allowed to dry, and draped in usual sterile manner.  An appropriate timeout was performed.  The patient was placed in Trendelenburg position.  The area lateral to the bend of the clavicle and the left chest wall was infiltrated with quarter percent Marcaine.  A large bore needle from the Port-A-Cath kit was used to slide beneath the bend of the clavicle heading towards the sternal notch.  After several attempts I was never able to identify the subclavian vein.  At this point elected to move to the left neck.  I was able to identify the left internal jugular vein using ultrasound and under ultrasound guidance I was able to access the left subclavian vein with the large bore needle from the Port-A-Cath kit.  Wire was fed through the needle using the Seldinger technique without difficulty.  The wire was confirmed in the central venous system using real-time fluoroscopy.  Next a small incision was made on the left chest wall and a small incision was made at the  wire entry site on the left neck with a 15 blade knife.  The left chest wall incision was carried through the skin and subcutaneous tissue sharply with the electrocautery.  A subcutaneous pocket was then created inferior to the incision by blunt finger dissection.  Next the 2 incisions were then joined by blunt hemostat dissection.  I was then able to place a tendon passer across the tunnel.  I used this to bring the tubing through the tunnel.  The tubing was then placed on the reservoir and the reservoir was placed in the pocket.  The length of the tubing was estimated using real-time fluoroscopy.  The tubing was cut to the appropriate length.  Next a sheath and dilator were fed over the wire using the Seldinger technique without difficulty.  The dilator and wire were removed from the patient.  The tubing was then fed through the sheath as far as it would go and then held in place while the sheath was gently cracked and separated.  Another real-time fluoroscopy image showed the tip of the catheter to be in the distal superior vena cava.  The tubing was then permanently anchored to the reservoir.  The reservoir was anchored in the pocket with two 2-0 Prolene stitches.  The port was then aspirated and it aspirated blood easily.  The port was then flushed initially with a dilute heparin solution and then with a more concentrated heparin solution.  The incision on the left neck was  then closed with a 4-0 Monocryl subcuticular stitch.  The subcutaneous tissue was closed over the port with interrupted 3-0 Vicryl stitches.  The skin was then closed with a running 4-0 Monocryl subcuticular stitch.  Dermabond dressings were applied.  The patient tolerated the procedure well.  At the end of the case all needle sponge and instrument counts were correct.  The patient was then awakened and taken to recovery in stable condition. ? ?PLAN OF CARE: Discharge to home after PACU ? ?PATIENT DISPOSITION:  PACU - hemodynamically  stable. ?  ?Delay start of Pharmacological VTE agent (>24hrs) due to surgical blood loss or risk of bleeding: not applicable ? ?

## 2021-07-26 NOTE — Anesthesia Postprocedure Evaluation (Signed)
Anesthesia Post Note ? ?Patient: DULCINEA KINSER ? ?Procedure(s) Performed: INSERTION PORT-A-CATH (Chest) ? ?  ? ?Patient location during evaluation: PACU ?Anesthesia Type: General ?Level of consciousness: awake and alert ?Pain management: pain level controlled ?Vital Signs Assessment: post-procedure vital signs reviewed and stable ?Respiratory status: spontaneous breathing, nonlabored ventilation and respiratory function stable ?Cardiovascular status: blood pressure returned to baseline and stable ?Postop Assessment: no apparent nausea or vomiting ?Anesthetic complications: no ? ? ?No notable events documented. ? ?Last Vitals:  ?Vitals:  ? 07/26/21 0922 07/26/21 0937  ?BP: 132/74 126/74  ?Pulse: 74 67  ?Resp: 17 18  ?Temp: (!) 36.4 ?C   ?SpO2: 98% 99%  ?  ?Last Pain:  ?Vitals:  ? 07/26/21 0922  ?TempSrc:   ?PainSc: 0-No pain  ? ? ?  ?  ?  ?  ?  ?  ? ?Santa Lighter ? ? ? ? ?

## 2021-07-26 NOTE — H&P (Signed)
?PROVIDER: Landry Corporal, MD ? ?MRN: [id number] ?DOB: 19-Aug-1955 ?Subjective  ? ?Chief Complaint: Post Operative Visit ? ? ?History of Present Illness: ?Christie Thomas is a 66 y.o. female who is seen today for right breast cancer. The patient is a 66 year old white female who is about 2 weeks status post right breast lumpectomy and sentinel node biopsy for a T1CN0 invasive lobular cancer that was ER positive and PR negative and HER2 negative with a Ki-67 of 1%. She also had her implants replaced. She tolerated the surgery well. She reports only some mild soreness. ? ? ? ?Review of Systems: ?A complete review of systems was obtained from the patient. I have reviewed this information and discussed as appropriate with the patient. See HPI as well for other ROS. ? ?ROS  ? ?Medical History: ?Past Medical History:  ?Diagnosis Date  ? Anxiety  ? Asthma, unspecified asthma severity, unspecified whether complicated, unspecified whether persistent  ? GERD (gastroesophageal reflux disease)  ? ?Patient Active Problem List  ?Diagnosis  ? Malignant neoplasm of upper-inner quadrant of right breast in female, estrogen receptor positive (CMS-HCC)  ? ?Past Surgical History:  ?Procedure Laterality Date  ? CHOLECYSTECTOMY  ? TRANSUMBILICAL AUGMENTATION MAMMAPLASTY  ? ? ?Allergies  ?Allergen Reactions  ? Penicillins Other (See Comments) and Unknown  ?Throat swelling ?Throat swelling ? ? Keflex [Cephalexin] Unknown  ? ?Current Outpatient Medications on File Prior to Visit  ?Medication Sig Dispense Refill  ? albuterol 90 mcg/actuation inhaler Inhale 2 inhalations into the lungs  ? clonazePAM (KLONOPIN) 0.5 MG tablet TAKE 1 TABLET 3 TIMES DAILY AS NEEDED FOR ANXIETY.  ? fenofibrate 160 MG tablet TAKE 1 TABLET ONCE DAILY WITH FOOD.  ? progesterone (PROMETRIUM) 100 MG capsule Take 1 capsule (100 mg total) by mouth at bedtime  ? rosuvastatin (CRESTOR) 10 MG tablet Take 1 tablet (10 mg total) by mouth once daily  ? testosterone 100  mg Pllt by Implant route.  ? ?No current facility-administered medications on file prior to visit.  ? ?Family History  ?Problem Relation Age of Onset  ? Stroke Mother  ? Hyperlipidemia (Elevated cholesterol) Mother  ? Diabetes Mother  ? Myocardial Infarction (Heart attack) Brother  ? ? ?Social History  ? ?Tobacco Use  ?Smoking Status Never  ?Smokeless Tobacco Never  ? ? ?Social History  ? ?Socioeconomic History  ? Marital status: Married  ?Tobacco Use  ? Smoking status: Never  ? Smokeless tobacco: Never  ? ?Objective:  ? ?There were no vitals filed for this visit.  ?There is no height or weight on file to calculate BMI. ? ?Physical Exam ?Vitals reviewed.  ?Constitutional:  ?General: She is not in acute distress. ?Appearance: Normal appearance.  ?HENT:  ?Head: Normocephalic and atraumatic.  ?Right Ear: External ear normal.  ?Left Ear: External ear normal.  ?Nose: Nose normal.  ?Mouth/Throat:  ?Mouth: Mucous membranes are moist.  ?Pharynx: Oropharynx is clear.  ?Eyes:  ?General: No scleral icterus. ?Extraocular Movements: Extraocular movements intact.  ?Conjunctiva/sclera: Conjunctivae normal.  ?Pupils: Pupils are equal, round, and reactive to light.  ?Cardiovascular:  ?Rate and Rhythm: Normal rate and regular rhythm.  ?Pulses: Normal pulses.  ?Heart sounds: Normal heart sounds.  ?Pulmonary:  ?Effort: Pulmonary effort is normal. No respiratory distress.  ?Breath sounds: Normal breath sounds.  ?Abdominal:  ?General: Bowel sounds are normal.  ?Palpations: Abdomen is soft.  ?Tenderness: There is no abdominal tenderness.  ?Musculoskeletal:  ?General: No swelling, tenderness or deformity. Normal range of motion.  ?Cervical back: Normal  range of motion and neck supple.  ?Skin: ?General: Skin is warm and dry.  ?Coloration: Skin is not jaundiced.  ?Neurological:  ?General: No focal deficit present.  ?Mental Status: She is alert and oriented to person, place, and time.  ?Psychiatric:  ?Mood and Affect: Mood normal.  ?Behavior:  Behavior normal.  ? ? ? ?Breast: Both side reduction incisions are healing nicely with no sign of infection or seroma. The right axillary incision is also healing well with no sign of infection or seroma ? ?Labs, Imaging and Diagnostic Testing: ? ?Assessment and Plan:  ? ?Diagnoses and all orders for this visit: ? ?Malignant neoplasm of upper-inner quadrant of right breast in female, estrogen receptor positive (CMS-HCC) ? ? ? ?The patient is about 2 weeks status post right breast lumpectomy for breast cancer. She tolerated the surgery well. At this point she will follow-up with medical and radiation oncology for adjuvant therapy. She will also follow-up with plastic surgery. I will plan to see her back in about 6 months. ?For port today ?

## 2021-07-26 NOTE — Interval H&P Note (Signed)
History and Physical Interval Note: ? ?07/26/2021 ?8:06 AM ? ?Christie Thomas  has presented today for surgery, with the diagnosis of RIGHT BREAST CANCER.  The various methods of treatment have been discussed with the patient and family. After consideration of risks, benefits and other options for treatment, the patient has consented to  Procedure(s): ?INSERTION PORT-A-CATH (N/A) as a surgical intervention.  The patient's history has been reviewed, patient examined, no change in status, stable for surgery.  I have reviewed the patient's chart and labs.  Questions were answered to the patient's satisfaction.   ? ? ?Autumn Messing III ? ? ?

## 2021-07-26 NOTE — Anesthesia Procedure Notes (Signed)
Procedure Name: LMA Insertion ?Date/Time: 07/26/2021 8:30 AM ?Performed by: Betha Loa, CRNA ?Pre-anesthesia Checklist: Patient identified, Emergency Drugs available, Suction available and Patient being monitored ?Patient Re-evaluated:Patient Re-evaluated prior to induction ?Oxygen Delivery Method: Circle System Utilized ?Preoxygenation: Pre-oxygenation with 100% oxygen ?Induction Type: IV induction ?Ventilation: Mask ventilation without difficulty ?LMA: LMA inserted ?LMA Size: 4.0 ?Number of attempts: 1 ?Airway Equipment and Method: Bite block ?Placement Confirmation: positive ETCO2 ?Tube secured with: Tape ?Dental Injury: Teeth and Oropharynx as per pre-operative assessment  ? ? ? ? ?

## 2021-07-27 ENCOUNTER — Inpatient Hospital Stay: Payer: BC Managed Care – PPO | Admitting: Hematology & Oncology

## 2021-07-27 ENCOUNTER — Inpatient Hospital Stay: Payer: BC Managed Care – PPO | Attending: Hematology & Oncology

## 2021-07-27 ENCOUNTER — Inpatient Hospital Stay: Payer: BC Managed Care – PPO

## 2021-07-27 ENCOUNTER — Encounter: Payer: Self-pay | Admitting: *Deleted

## 2021-07-27 ENCOUNTER — Encounter: Payer: Self-pay | Admitting: Hematology & Oncology

## 2021-07-27 VITALS — BP 128/64 | HR 63 | Resp 20

## 2021-07-27 VITALS — BP 138/70 | HR 82 | Temp 98.3°F | Resp 18 | Ht 64.0 in | Wt 197.2 lb

## 2021-07-27 DIAGNOSIS — Z170 Estrogen receptor positive status [ER+]: Secondary | ICD-10-CM

## 2021-07-27 DIAGNOSIS — Z5111 Encounter for antineoplastic chemotherapy: Secondary | ICD-10-CM | POA: Insufficient documentation

## 2021-07-27 DIAGNOSIS — Z5189 Encounter for other specified aftercare: Secondary | ICD-10-CM | POA: Insufficient documentation

## 2021-07-27 DIAGNOSIS — Z79899 Other long term (current) drug therapy: Secondary | ICD-10-CM | POA: Insufficient documentation

## 2021-07-27 DIAGNOSIS — C50211 Malignant neoplasm of upper-inner quadrant of right female breast: Secondary | ICD-10-CM | POA: Diagnosis not present

## 2021-07-27 LAB — CBC WITH DIFFERENTIAL (CANCER CENTER ONLY)
Abs Immature Granulocytes: 0.09 10*3/uL — ABNORMAL HIGH (ref 0.00–0.07)
Basophils Absolute: 0 10*3/uL (ref 0.0–0.1)
Basophils Relative: 0 %
Eosinophils Absolute: 0 10*3/uL (ref 0.0–0.5)
Eosinophils Relative: 0 %
HCT: 41.1 % (ref 36.0–46.0)
Hemoglobin: 13.6 g/dL (ref 12.0–15.0)
Immature Granulocytes: 1 %
Lymphocytes Relative: 7 %
Lymphs Abs: 1.3 10*3/uL (ref 0.7–4.0)
MCH: 30.2 pg (ref 26.0–34.0)
MCHC: 33.1 g/dL (ref 30.0–36.0)
MCV: 91.1 fL (ref 80.0–100.0)
Monocytes Absolute: 0.6 10*3/uL (ref 0.1–1.0)
Monocytes Relative: 3 %
Neutro Abs: 16.4 10*3/uL — ABNORMAL HIGH (ref 1.7–7.7)
Neutrophils Relative %: 89 %
Platelet Count: 244 10*3/uL (ref 150–400)
RBC: 4.51 MIL/uL (ref 3.87–5.11)
RDW: 12.9 % (ref 11.5–15.5)
WBC Count: 18.5 10*3/uL — ABNORMAL HIGH (ref 4.0–10.5)
nRBC: 0 % (ref 0.0–0.2)

## 2021-07-27 LAB — CMP (CANCER CENTER ONLY)
ALT: 51 U/L — ABNORMAL HIGH (ref 0–44)
AST: 38 U/L (ref 15–41)
Albumin: 4.1 g/dL (ref 3.5–5.0)
Alkaline Phosphatase: 91 U/L (ref 38–126)
Anion gap: 8 (ref 5–15)
BUN: 20 mg/dL (ref 8–23)
CO2: 23 mmol/L (ref 22–32)
Calcium: 10 mg/dL (ref 8.9–10.3)
Chloride: 108 mmol/L (ref 98–111)
Creatinine: 0.96 mg/dL (ref 0.44–1.00)
GFR, Estimated: >60 mL/min (ref >60)
Glucose, Bld: 184 mg/dL — ABNORMAL HIGH (ref 70–99)
Potassium: 4.5 mmol/L (ref 3.5–5.1)
Sodium: 139 mmol/L (ref 135–145)
Total Bilirubin: 0.6 mg/dL (ref 0.3–1.2)
Total Protein: 7.2 g/dL (ref 6.5–8.1)

## 2021-07-27 LAB — LACTATE DEHYDROGENASE: LDH: 171 U/L (ref 98–192)

## 2021-07-27 MED ORDER — SODIUM CHLORIDE 0.9 % IV SOLN: Freq: Once | INTRAVENOUS | Status: AC

## 2021-07-27 MED ORDER — HEPARIN SOD (PORK) LOCK FLUSH 100 UNIT/ML IV SOLN
500.0000 [IU] | Freq: Once | INTRAVENOUS | Status: AC | PRN
  Administered 2021-07-27: 500 [IU]

## 2021-07-27 MED ORDER — SODIUM CHLORIDE 0.9 % IV SOLN
10.0000 mg | Freq: Once | INTRAVENOUS | Status: AC
  Administered 2021-07-27: 10 mg via INTRAVENOUS
  Filled 2021-07-27: qty 10

## 2021-07-27 MED ORDER — SODIUM CHLORIDE 0.9% FLUSH
10.0000 mL | INTRAVENOUS | Status: DC | PRN
  Administered 2021-07-27: 10 mL

## 2021-07-27 MED ORDER — PALONOSETRON HCL INJECTION 0.25 MG/5ML
0.2500 mg | Freq: Once | INTRAVENOUS | Status: AC
  Administered 2021-07-27: 0.25 mg via INTRAVENOUS
  Filled 2021-07-27: qty 5

## 2021-07-27 MED ORDER — SODIUM CHLORIDE 0.9 % IV SOLN
600.0000 mg/m2 | Freq: Once | INTRAVENOUS | Status: AC
  Administered 2021-07-27: 1200 mg via INTRAVENOUS
  Filled 2021-07-27: qty 60

## 2021-07-27 MED ORDER — SODIUM CHLORIDE 0.9 % IV SOLN
75.0000 mg/m2 | Freq: Once | INTRAVENOUS | Status: AC
  Administered 2021-07-27: 150 mg via INTRAVENOUS
  Filled 2021-07-27: qty 15

## 2021-07-27 NOTE — Patient Instructions (Signed)
[facility] [place]  Discharge Instructions: ?Thank you for choosing [facility] to provide your oncology and hematology care.  ? ?If you have a lab appointment with the [facility], please go directly to the [facility] and check in at the registration area. ? ?Wear comfortable clothing and clothing appropriate for easy access to any Portacath or PICC line.  ? ?We strive to give you quality time with your provider. You may need to reschedule your appointment if you arrive late (15 or more minutes).  Arriving late affects you and other patients whose appointments are after yours.  Also, if you miss three or more appointments without notifying the office, you may be dismissed from the clinic at the provider?s discretion.    ?  ?For prescription refill requests, have your pharmacy contact our office and allow 72 hours for refills to be completed.   ? ?Today you received the following chemotherapy and/or immunotherapy agents:  Taxotere and Cytoxan    ?  ?To help prevent nausea and vomiting after your treatment, we encourage you to take your nausea medication as directed. ? ?BELOW ARE SYMPTOMS THAT SHOULD BE REPORTED IMMEDIATELY: ?*FEVER GREATER THAN 100.4 F (38 ?C) OR HIGHER ?*CHILLS OR SWEATING ?*NAUSEA AND VOMITING THAT IS NOT CONTROLLED WITH YOUR NAUSEA MEDICATION ?*UNUSUAL SHORTNESS OF BREATH ?*UNUSUAL BRUISING OR BLEEDING ?*URINARY PROBLEMS (pain or burning when urinating, or frequent urination) ?*BOWEL PROBLEMS (unusual diarrhea, constipation, pain near the anus) ?TENDERNESS IN MOUTH AND THROAT WITH OR WITHOUT PRESENCE OF ULCERS (sore throat, sores in mouth, or a toothache) ?UNUSUAL RASH, SWELLING OR PAIN  ?UNUSUAL VAGINAL DISCHARGE OR ITCHING  ? ?Items with * indicate a potential emergency and should be followed up as soon as possible or go to the Emergency Department if any problems should occur. ? ?Please show the CHEMOTHERAPY ALERT CARD or IMMUNOTHERAPY ALERT CARD at  check-in to the Emergency Department and triage nurse. ?Should you have questions after your visit or need to cancel or reschedule your appointment, please contact [facility]  [phone] and follow the prompts.  Office hours are 8:00 a.m. to 4:30 p.m. Monday - Friday. Please note that voicemails left after 4:00 p.m. may not be returned until the following business day.  We are closed weekends and major holidays. You have access to a nurse at all times for urgent questions. Please call the main number to the clinic [phone] and follow the prompts. ? ?For any non-urgent questions, you may also contact your provider using MyChart. We now offer e-Visits for anyone 48 and older to request care online for non-urgent symptoms. For details visit [email or web address]. ?  ?Also download the MyChart app! Go to the app store, search "MyChart", open the app, select [facility], and log in with your MyChart username and password. ? ?Due to Covid, a mask is required upon entering the hospital/clinic. If you do not have a mask, one will be given to you upon arrival. For doctor visits, patients may have 1 support person aged 67 or older with them. For treatment visits, patients cannot have anyone with them due to current Covid guidelines and our immunocompromised population.  ?

## 2021-07-27 NOTE — Progress Notes (Signed)
Ok to use Chemistry panel from 07/22/21 per Dr Marin Olp for todays treatment ?

## 2021-07-27 NOTE — Progress Notes (Signed)
Patient in chemotherapy education class.  Discussed side effects of Taxotere and Cytoxan which include but are not limited to myelosuppression, decreased appetite, fatigue, fever, allergic or infusional reaction, mucositis, cardiac toxicity, cough, SOB, altered taste, nausea and vomiting, diarrhea, constipation, elevated LFTs myalgia and arthralgias, hair loss or thinning, rash, skin dryness, nail changes, peripheral neuropathy, discolored urine, delayed wound healing, mental changes (Chemo brain), increased risk of infections, weight loss.  Reviewed infusion room and office policy.   Reviewed when to call the office with any concerns or problems.  Scientist, clinical (histocompatibility and immunogenetics) given.  Discussed portacath insertion and EMLA cream administration.  Antiemetic protocol and chemotherapy schedule reviewed. Patient verbalized understanding of chemotherapy indications and possible side effects.  Teachback done  ?

## 2021-07-27 NOTE — Progress Notes (Signed)
?Hematology and Oncology Follow Up Visit ? ?Christie Thomas ?[id number] ?May 25, 1955 66 y.o. ?07/27/2021 ? ? ?Principle Diagnosis:  ?Stage Ib (T1cN0M0) infiltrating lobular carcinoma of the right breast -- ER+/PR-/HER2- --Oncotype score = 28 ? ?Current Therapy:   ?status post lumpectomy on 05/17/2021 ?Taxotere/Cytoxan -- start cyle #1/4 on 07/27/2021 ?    ?Interim History:  Christie Thomas is back for follow-up.  She and her husband just got back from a nice trip to the [place].  Unfortunately, they both got COVID.  I think he got COVID when they are over in [place].  They are isolated for 3 days.  After that, they began to feel little bit better. ? ?She has had no issues with respect to the reconstruction.  She had implants taken out. ? ?She has had no issues with nausea or vomiting.  She has had no change in bowel or bladder habits.  She has had no cough or shortness of breath.  There is been no rashes.  She has had no headache. ? ?Overall, I would say her performance status is probably ECOG 1.  ? ?Medications:  ?Current Outpatient Medications:  ?  albuterol (PROVENTIL HFA;VENTOLIN HFA) 108 (90 Base) MCG/ACT inhaler, Inhale 2 puffs into the lungs every 4 (four) hours as needed., Disp: 1 Inhaler, Rfl: 6 ?  beclomethasone (QVAR) 40 MCG/ACT inhaler, Inhale 1 puff into the lungs 2 (two) times daily as needed (shortness of breath)., Disp: , Rfl:  ?  clonazePAM (KLONOPIN) 0.5 MG tablet, TAKE 1 TABLET 3 TIMES DAILY AS NEEDED FOR ANXIETY., Disp: 60 tablet, Rfl: 1 ?  dexamethasone (DECADRON) 4 MG tablet, Take 2 tablets (8 mg total) by mouth 2 (two) times daily. Start the day before Taxotere. Then again the day after chemo for 3 days., Disp: 30 tablet, Rfl: 1 ?  fenofibrate 160 MG tablet, TAKE 1 TABLET ONCE DAILY WITH FOOD., Disp: 90 tablet, Rfl: 0 ?  fluticasone (FLONASE) 50 MCG/ACT nasal spray, instill 1 to 2 sprays into each nostril daily (Patient taking differently: Place 1-2 sprays into both nostrils daily  as needed for allergies.), Disp: 16 g, Rfl: 3 ?  lidocaine-prilocaine (EMLA) cream, Apply to affected area once, Disp: 30 g, Rfl: 3 ?  ondansetron (ZOFRAN) 8 MG tablet, Take 1 tablet (8 mg total) by mouth 2 (two) times daily as needed for refractory nausea / vomiting. Start on day 3 after chemo., Disp: 30 tablet, Rfl: 1 ?  oxyCODONE (ROXICODONE) 5 MG immediate release tablet, Take 1 tablet (5 mg total) by mouth every 6 (six) hours as needed for severe pain., Disp: 10 tablet, Rfl: 0 ?  Polyethyl Glycol-Propyl Glycol (SYSTANE OP), Place 1 drop into both eyes 2 (two) times daily as needed (dry/irritated eyes)., Disp: , Rfl:  ?  prochlorperazine (COMPAZINE) 10 MG tablet, Take 1 tablet (10 mg total) by mouth every 6 (six) hours as needed (Nausea or vomiting)., Disp: 30 tablet, Rfl: 1 ?  rosuvastatin (CRESTOR) 10 MG tablet, Take 1 tablet (10 mg total) by mouth daily., Disp: 90 tablet, Rfl: 0 ? ?Allergies:  ?Allergies  ?Allergen Reactions  ? Iodine Anaphylaxis  ? Penicillins Anaphylaxis  ?  Throat swelling ?  ? Shellfish Allergy Anaphylaxis  ? Cephalexin   ?  Other reaction(s): Unknown  ? Ciprofloxacin   ?  Other reaction(s): Unknown  ? Metoclopramide Other (See Comments)  ? ? ?Past Medical History, Surgical history, Social history, and Family History were reviewed and updated. ? ?Review of Systems: ?Review of Systems  ?  Constitutional: Negative.   ?HENT:  Negative.    ?Eyes: Negative.   ?Respiratory: Negative.    ?Cardiovascular: Negative.   ?Gastrointestinal: Negative.   ?Endocrine: Negative.   ?Genitourinary: Negative.    ?Musculoskeletal: Negative.   ?Skin: Negative.   ?Neurological: Negative.   ?Hematological: Negative.   ?Psychiatric/Behavioral: Negative.    ? ?Physical Exam: ? vitals were not taken for this visit.  ? ?Wt Readings from Last 3 Encounters:  ?07/26/21 192 lb (87.1 kg)  ?07/22/21 192 lb 14.4 oz (87.5 kg)  ?06/08/21 194 lb 4 oz (88.1 kg)  ? ? ?Physical Exam ?Vitals reviewed.  ?Constitutional:   ?    Comments: Breast exam shows surgical scar with the left breast related to some augmentation.  There is no left axillary adenopathy.  The right breast also has the surgical scar.  This is a healing.  There is a healing right lymphadenectomy scar.  There is no erythema or warmth of either breast.  ?HENT:  ?   Head: Normocephalic and atraumatic.  ?Eyes:  ?   Pupils: Pupils are equal, round, and reactive to light.  ?Cardiovascular:  ?   Rate and Rhythm: Normal rate and regular rhythm.  ?   Heart sounds: Normal heart sounds.  ?Pulmonary:  ?   Effort: Pulmonary effort is normal.  ?   Breath sounds: Normal breath sounds.  ?Abdominal:  ?   General: Bowel sounds are normal.  ?   Palpations: Abdomen is soft.  ?Musculoskeletal:     ?   General: No tenderness or deformity. Normal range of motion.  ?   Cervical back: Normal range of motion.  ?Lymphadenopathy:  ?   Cervical: No cervical adenopathy.  ?Skin: ?   General: Skin is warm and dry.  ?   Findings: No erythema or rash.  ?Neurological:  ?   Mental Status: She is alert and oriented to person, place, and time.  ?Psychiatric:     ?   Behavior: Behavior normal.     ?   Thought Content: Thought content normal.     ?   Judgment: Judgment normal.  ? ? ? ?Lab Results  ?Component Value Date  ? WBC 7.0 07/22/2021  ? HGB 15.0 07/22/2021  ? HCT 45.0 07/22/2021  ? MCV 91.5 07/22/2021  ? PLT 254 07/22/2021  ? ?  Chemistry   ?   ?Component Value Date/Time  ? NA 139 07/22/2021 0850  ? NA 142 11/18/2015 0000  ? K 3.9 07/22/2021 0850  ? CL 110 07/22/2021 0850  ? CO2 22 07/22/2021 0850  ? BUN 15 07/22/2021 0850  ? BUN 17 11/18/2015 0000  ? CREATININE 0.95 07/22/2021 0850  ? CREATININE 0.93 03/03/2021 1117  ? CREATININE 1.03 (H) 06/21/2016 1011  ? GLU 102 11/18/2015 0000  ?    ?Component Value Date/Time  ? CALCIUM 10.0 07/22/2021 0850  ? ALKPHOS 82 07/22/2021 0850  ? AST 71 (H) 07/22/2021 0850  ? AST 61 (H) 03/03/2021 1117  ? ALT 66 (H) 07/22/2021 0850  ? ALT 63 (H) 03/03/2021 1117  ? BILITOT  0.8 07/22/2021 0850  ? BILITOT 0.8 03/03/2021 1117  ?  ? ? ?Impression and Plan: ?Christie Thomas is a very charming 66 year old postmenopausal white female.  She has a stage Ib lobular carcinoma of the right breast.  She clearly has aggressive cancer because of the high Oncotype score. ? ?She would be a candidate for adjuvant chemotherapy.  I probably would consider Taxotere/Cytoxan.  I like this  protocol.  I think this is well-tolerated.  I think is quite effective. ? ?She is also definitely need radiation therapy.  I think with some of the new data that is out there, the amount of radiation that is given is certainly less. ? ?She will also need antiestrogen therapy. ? ?We will go ahead with her first cycle of chemotherapy.  She had a Port-A-Cath placed yesterday. ? ?She sees Dr. Marla Roe of Plastic Surgery this week for follow-up. ? ?We will plan to get her back in another 3 weeks for cycle #2.  Again she will get total of 4 cycles in the adjuvant setting. ? ? ?Volanda Napoleon, MD ?5/9/20238:45 AM  ?

## 2021-07-27 NOTE — Patient Instructions (Signed)

## 2021-07-28 ENCOUNTER — Encounter: Payer: Self-pay | Admitting: *Deleted

## 2021-07-28 LAB — CANCER ANTIGEN 27.29: CA 27.29: 17 U/mL (ref 0.0–38.6)

## 2021-07-28 NOTE — Progress Notes (Signed)
Called patient after her first chemo treatment yesterday. She is doing well with no complaints from her treatment. She took her decadron as prescribed this morning. She denies any issues with n/v but confirms that she has her antiemetics to use as needed. She did have some constipation last pm which resolved with a stool softener.  ? ?She states her port is still quite tender and pain is noticeable with movement. She plans on resting today to try and help with this symptom. ? ?She is encouraged to call the office with any questions or concerns.  ? ?Oncology Nurse Navigator Documentation ? ? ?  07/28/2021  ?  9:45 AM  ?Oncology Nurse Navigator Flowsheets  ?Phase of Treatment Chemo  ?Chemotherapy Actual Start Date: 07/27/2021  ?Chemotherapy Expected End Date: 09/30/2021  ?Navigator Follow Up Date: 08/17/2021  ?Navigator Follow Up Reason: Follow-up Appointment;Chemotherapy  ?Navigator Location [facility]  ?Navigator Encounter Type Telephone  ?Telephone Patient Update;Outgoing Call  ?Treatment Initiated Date 05/17/2021  ?Patient Visit Type MedOnc  ?Treatment Phase Active Tx  ?Barriers/Navigation Needs Coordination of Care;Education  ?Education Pain/ Symptom Management  ?Interventions Education;Psycho-Social Support  ?Acuity Level 2-Minimal Needs (1-2 Barriers Identified)  ?Education Method Verbal  ?Support Groups/Services Friends and Family  ?Time Spent with Patient 15  ?  ?

## 2021-07-29 ENCOUNTER — Inpatient Hospital Stay: Payer: BC Managed Care – PPO

## 2021-07-29 VITALS — BP 143/60 | HR 66 | Temp 98.0°F | Resp 18

## 2021-07-29 DIAGNOSIS — R197 Diarrhea, unspecified: Secondary | ICD-10-CM | POA: Diagnosis not present

## 2021-07-29 DIAGNOSIS — R6520 Severe sepsis without septic shock: Secondary | ICD-10-CM | POA: Diagnosis not present

## 2021-07-29 DIAGNOSIS — Z79899 Other long term (current) drug therapy: Secondary | ICD-10-CM | POA: Diagnosis not present

## 2021-07-29 DIAGNOSIS — Z170 Estrogen receptor positive status [ER+]: Secondary | ICD-10-CM

## 2021-07-29 DIAGNOSIS — U071 COVID-19: Secondary | ICD-10-CM | POA: Diagnosis not present

## 2021-07-29 DIAGNOSIS — E7800 Pure hypercholesterolemia, unspecified: Secondary | ICD-10-CM | POA: Diagnosis not present

## 2021-07-29 DIAGNOSIS — I509 Heart failure, unspecified: Secondary | ICD-10-CM | POA: Diagnosis not present

## 2021-07-29 DIAGNOSIS — T451X5A Adverse effect of antineoplastic and immunosuppressive drugs, initial encounter: Secondary | ICD-10-CM | POA: Diagnosis not present

## 2021-07-29 DIAGNOSIS — K5792 Diverticulitis of intestine, part unspecified, without perforation or abscess without bleeding: Secondary | ICD-10-CM | POA: Diagnosis not present

## 2021-07-29 DIAGNOSIS — Z91013 Allergy to seafood: Secondary | ICD-10-CM | POA: Diagnosis not present

## 2021-07-29 DIAGNOSIS — D72829 Elevated white blood cell count, unspecified: Secondary | ICD-10-CM | POA: Diagnosis not present

## 2021-07-29 DIAGNOSIS — N281 Cyst of kidney, acquired: Secondary | ICD-10-CM | POA: Diagnosis not present

## 2021-07-29 DIAGNOSIS — J4520 Mild intermittent asthma, uncomplicated: Secondary | ICD-10-CM | POA: Diagnosis not present

## 2021-07-29 DIAGNOSIS — N390 Urinary tract infection, site not specified: Secondary | ICD-10-CM | POA: Diagnosis not present

## 2021-07-29 DIAGNOSIS — R5081 Fever presenting with conditions classified elsewhere: Secondary | ICD-10-CM | POA: Diagnosis present

## 2021-07-29 DIAGNOSIS — J45909 Unspecified asthma, uncomplicated: Secondary | ICD-10-CM | POA: Diagnosis not present

## 2021-07-29 DIAGNOSIS — A419 Sepsis, unspecified organism: Secondary | ICD-10-CM | POA: Diagnosis not present

## 2021-07-29 DIAGNOSIS — G40909 Epilepsy, unspecified, not intractable, without status epilepticus: Secondary | ICD-10-CM | POA: Diagnosis not present

## 2021-07-29 DIAGNOSIS — C50911 Malignant neoplasm of unspecified site of right female breast: Secondary | ICD-10-CM | POA: Diagnosis not present

## 2021-07-29 DIAGNOSIS — R109 Unspecified abdominal pain: Secondary | ICD-10-CM | POA: Diagnosis not present

## 2021-07-29 DIAGNOSIS — E876 Hypokalemia: Secondary | ICD-10-CM | POA: Diagnosis present

## 2021-07-29 DIAGNOSIS — J45901 Unspecified asthma with (acute) exacerbation: Secondary | ICD-10-CM | POA: Diagnosis not present

## 2021-07-29 DIAGNOSIS — K219 Gastro-esophageal reflux disease without esophagitis: Secondary | ICD-10-CM | POA: Diagnosis not present

## 2021-07-29 DIAGNOSIS — D709 Neutropenia, unspecified: Secondary | ICD-10-CM | POA: Diagnosis not present

## 2021-07-29 DIAGNOSIS — D701 Agranulocytosis secondary to cancer chemotherapy: Secondary | ICD-10-CM | POA: Diagnosis not present

## 2021-07-29 DIAGNOSIS — E669 Obesity, unspecified: Secondary | ICD-10-CM | POA: Diagnosis not present

## 2021-07-29 DIAGNOSIS — C50211 Malignant neoplasm of upper-inner quadrant of right female breast: Secondary | ICD-10-CM | POA: Diagnosis not present

## 2021-07-29 DIAGNOSIS — K5900 Constipation, unspecified: Secondary | ICD-10-CM | POA: Diagnosis not present

## 2021-07-29 DIAGNOSIS — Z862 Personal history of diseases of the blood and blood-forming organs and certain disorders involving the immune mechanism: Secondary | ICD-10-CM | POA: Diagnosis not present

## 2021-07-29 DIAGNOSIS — D696 Thrombocytopenia, unspecified: Secondary | ICD-10-CM | POA: Diagnosis not present

## 2021-07-29 DIAGNOSIS — C50919 Malignant neoplasm of unspecified site of unspecified female breast: Secondary | ICD-10-CM | POA: Diagnosis not present

## 2021-07-29 DIAGNOSIS — K5730 Diverticulosis of large intestine without perforation or abscess without bleeding: Secondary | ICD-10-CM | POA: Diagnosis not present

## 2021-07-29 DIAGNOSIS — R202 Paresthesia of skin: Secondary | ICD-10-CM | POA: Diagnosis not present

## 2021-07-29 DIAGNOSIS — Z888 Allergy status to other drugs, medicaments and biological substances status: Secondary | ICD-10-CM | POA: Diagnosis not present

## 2021-07-29 DIAGNOSIS — N3000 Acute cystitis without hematuria: Secondary | ICD-10-CM | POA: Diagnosis not present

## 2021-07-29 DIAGNOSIS — E871 Hypo-osmolality and hyponatremia: Secondary | ICD-10-CM | POA: Diagnosis not present

## 2021-07-29 DIAGNOSIS — K760 Fatty (change of) liver, not elsewhere classified: Secondary | ICD-10-CM | POA: Diagnosis not present

## 2021-07-29 DIAGNOSIS — B370 Candidal stomatitis: Secondary | ICD-10-CM | POA: Diagnosis not present

## 2021-07-29 DIAGNOSIS — Z5111 Encounter for antineoplastic chemotherapy: Secondary | ICD-10-CM | POA: Diagnosis not present

## 2021-07-29 DIAGNOSIS — Z20822 Contact with and (suspected) exposure to covid-19: Secondary | ICD-10-CM | POA: Diagnosis not present

## 2021-07-29 DIAGNOSIS — Z881 Allergy status to other antibiotic agents status: Secondary | ICD-10-CM | POA: Diagnosis not present

## 2021-07-29 DIAGNOSIS — B3781 Candidal esophagitis: Secondary | ICD-10-CM | POA: Diagnosis not present

## 2021-07-29 DIAGNOSIS — Z6833 Body mass index (BMI) 33.0-33.9, adult: Secondary | ICD-10-CM | POA: Diagnosis not present

## 2021-07-29 DIAGNOSIS — M069 Rheumatoid arthritis, unspecified: Secondary | ICD-10-CM | POA: Diagnosis not present

## 2021-07-29 DIAGNOSIS — E785 Hyperlipidemia, unspecified: Secondary | ICD-10-CM | POA: Diagnosis present

## 2021-07-29 DIAGNOSIS — Z5189 Encounter for other specified aftercare: Secondary | ICD-10-CM | POA: Diagnosis not present

## 2021-07-29 DIAGNOSIS — M549 Dorsalgia, unspecified: Secondary | ICD-10-CM | POA: Diagnosis not present

## 2021-07-29 DIAGNOSIS — K5732 Diverticulitis of large intestine without perforation or abscess without bleeding: Secondary | ICD-10-CM | POA: Diagnosis not present

## 2021-07-29 DIAGNOSIS — K2100 Gastro-esophageal reflux disease with esophagitis, without bleeding: Secondary | ICD-10-CM | POA: Diagnosis not present

## 2021-07-29 MED ORDER — PEGFILGRASTIM-CBQV 6 MG/0.6ML ~~LOC~~ SOSY
6.0000 mg | PREFILLED_SYRINGE | Freq: Once | SUBCUTANEOUS | Status: AC
  Administered 2021-07-29: 6 mg via SUBCUTANEOUS
  Filled 2021-07-29: qty 0.6

## 2021-07-29 NOTE — Patient Instructions (Signed)

## 2021-08-01 ENCOUNTER — Inpatient Hospital Stay (HOSPITAL_BASED_OUTPATIENT_CLINIC_OR_DEPARTMENT_OTHER)
Admission: EM | Admit: 2021-08-01 | Discharge: 2021-08-07 | DRG: 872 | Disposition: A | Discharge status: Home / Self Care | Payer: BC Managed Care – PPO | Attending: Internal Medicine | Admitting: Internal Medicine

## 2021-08-01 ENCOUNTER — Other Ambulatory Visit: Payer: Self-pay

## 2021-08-01 ENCOUNTER — Emergency Department (HOSPITAL_BASED_OUTPATIENT_CLINIC_OR_DEPARTMENT_OTHER): Payer: BC Managed Care – PPO

## 2021-08-01 ENCOUNTER — Encounter (HOSPITAL_BASED_OUTPATIENT_CLINIC_OR_DEPARTMENT_OTHER): Payer: Self-pay | Admitting: Emergency Medicine

## 2021-08-01 ENCOUNTER — Inpatient Hospital Stay (HOSPITAL_COMMUNITY): Payer: BC Managed Care – PPO

## 2021-08-01 DIAGNOSIS — G8929 Other chronic pain: Secondary | ICD-10-CM | POA: Diagnosis present

## 2021-08-01 DIAGNOSIS — G4733 Obstructive sleep apnea (adult) (pediatric): Secondary | ICD-10-CM | POA: Diagnosis present

## 2021-08-01 DIAGNOSIS — E876 Hypokalemia: Secondary | ICD-10-CM | POA: Diagnosis present

## 2021-08-01 DIAGNOSIS — Z20822 Contact with and (suspected) exposure to covid-19: Secondary | ICD-10-CM | POA: Diagnosis present

## 2021-08-01 DIAGNOSIS — N390 Urinary tract infection, site not specified: Secondary | ICD-10-CM | POA: Diagnosis present

## 2021-08-01 DIAGNOSIS — Z6833 Body mass index (BMI) 33.0-33.9, adult: Secondary | ICD-10-CM

## 2021-08-01 DIAGNOSIS — Z888 Allergy status to other drugs, medicaments and biological substances status: Secondary | ICD-10-CM | POA: Diagnosis not present

## 2021-08-01 DIAGNOSIS — Z881 Allergy status to other antibiotic agents status: Secondary | ICD-10-CM

## 2021-08-01 DIAGNOSIS — B3781 Candidal esophagitis: Secondary | ICD-10-CM | POA: Diagnosis present

## 2021-08-01 DIAGNOSIS — D709 Neutropenia, unspecified: Secondary | ICD-10-CM | POA: Diagnosis present

## 2021-08-01 DIAGNOSIS — R202 Paresthesia of skin: Secondary | ICD-10-CM | POA: Diagnosis not present

## 2021-08-01 DIAGNOSIS — N3000 Acute cystitis without hematuria: Secondary | ICD-10-CM | POA: Diagnosis not present

## 2021-08-01 DIAGNOSIS — I509 Heart failure, unspecified: Secondary | ICD-10-CM | POA: Diagnosis not present

## 2021-08-01 DIAGNOSIS — R5081 Fever presenting with conditions classified elsewhere: Secondary | ICD-10-CM | POA: Diagnosis present

## 2021-08-01 DIAGNOSIS — R6520 Severe sepsis without septic shock: Secondary | ICD-10-CM | POA: Diagnosis not present

## 2021-08-01 DIAGNOSIS — D701 Agranulocytosis secondary to cancer chemotherapy: Secondary | ICD-10-CM | POA: Diagnosis present

## 2021-08-01 DIAGNOSIS — Z83438 Family history of other disorder of lipoprotein metabolism and other lipidemia: Secondary | ICD-10-CM

## 2021-08-01 DIAGNOSIS — G40909 Epilepsy, unspecified, not intractable, without status epilepticus: Secondary | ICD-10-CM | POA: Diagnosis present

## 2021-08-01 DIAGNOSIS — E669 Obesity, unspecified: Secondary | ICD-10-CM | POA: Diagnosis present

## 2021-08-01 DIAGNOSIS — D696 Thrombocytopenia, unspecified: Secondary | ICD-10-CM | POA: Diagnosis present

## 2021-08-01 DIAGNOSIS — K5792 Diverticulitis of intestine, part unspecified, without perforation or abscess without bleeding: Secondary | ICD-10-CM | POA: Diagnosis not present

## 2021-08-01 DIAGNOSIS — J45909 Unspecified asthma, uncomplicated: Secondary | ICD-10-CM | POA: Diagnosis present

## 2021-08-01 DIAGNOSIS — E871 Hypo-osmolality and hyponatremia: Secondary | ICD-10-CM | POA: Diagnosis present

## 2021-08-01 DIAGNOSIS — M069 Rheumatoid arthritis, unspecified: Secondary | ICD-10-CM | POA: Diagnosis present

## 2021-08-01 DIAGNOSIS — K5790 Diverticulosis of intestine, part unspecified, without perforation or abscess without bleeding: Secondary | ICD-10-CM

## 2021-08-01 DIAGNOSIS — E7800 Pure hypercholesterolemia, unspecified: Secondary | ICD-10-CM | POA: Diagnosis not present

## 2021-08-01 DIAGNOSIS — R197 Diarrhea, unspecified: Secondary | ICD-10-CM | POA: Diagnosis not present

## 2021-08-01 DIAGNOSIS — C50211 Malignant neoplasm of upper-inner quadrant of right female breast: Secondary | ICD-10-CM | POA: Diagnosis present

## 2021-08-01 DIAGNOSIS — Z809 Family history of malignant neoplasm, unspecified: Secondary | ICD-10-CM

## 2021-08-01 DIAGNOSIS — B370 Candidal stomatitis: Secondary | ICD-10-CM | POA: Diagnosis not present

## 2021-08-01 DIAGNOSIS — E785 Hyperlipidemia, unspecified: Secondary | ICD-10-CM | POA: Diagnosis present

## 2021-08-01 DIAGNOSIS — T373X5A Adverse effect of other antiprotozoal drugs, initial encounter: Secondary | ICD-10-CM | POA: Diagnosis not present

## 2021-08-01 DIAGNOSIS — U071 COVID-19: Secondary | ICD-10-CM | POA: Diagnosis not present

## 2021-08-01 DIAGNOSIS — T451X5A Adverse effect of antineoplastic and immunosuppressive drugs, initial encounter: Secondary | ICD-10-CM | POA: Diagnosis not present

## 2021-08-01 DIAGNOSIS — D72829 Elevated white blood cell count, unspecified: Secondary | ICD-10-CM | POA: Diagnosis not present

## 2021-08-01 DIAGNOSIS — K219 Gastro-esophageal reflux disease without esophagitis: Secondary | ICD-10-CM | POA: Diagnosis present

## 2021-08-01 DIAGNOSIS — R109 Unspecified abdominal pain: Secondary | ICD-10-CM | POA: Diagnosis not present

## 2021-08-01 DIAGNOSIS — Z91013 Allergy to seafood: Secondary | ICD-10-CM

## 2021-08-01 DIAGNOSIS — Z9221 Personal history of antineoplastic chemotherapy: Secondary | ICD-10-CM

## 2021-08-01 DIAGNOSIS — K2100 Gastro-esophageal reflux disease with esophagitis, without bleeding: Secondary | ICD-10-CM | POA: Diagnosis not present

## 2021-08-01 DIAGNOSIS — Z862 Personal history of diseases of the blood and blood-forming organs and certain disorders involving the immune mechanism: Secondary | ICD-10-CM | POA: Diagnosis not present

## 2021-08-01 DIAGNOSIS — K760 Fatty (change of) liver, not elsewhere classified: Secondary | ICD-10-CM | POA: Diagnosis present

## 2021-08-01 DIAGNOSIS — A419 Sepsis, unspecified organism: Principal | ICD-10-CM | POA: Diagnosis present

## 2021-08-01 DIAGNOSIS — C50911 Malignant neoplasm of unspecified site of right female breast: Secondary | ICD-10-CM | POA: Diagnosis not present

## 2021-08-01 DIAGNOSIS — K5732 Diverticulitis of large intestine without perforation or abscess without bleeding: Secondary | ICD-10-CM | POA: Diagnosis present

## 2021-08-01 DIAGNOSIS — J4520 Mild intermittent asthma, uncomplicated: Secondary | ICD-10-CM | POA: Diagnosis not present

## 2021-08-01 DIAGNOSIS — K5900 Constipation, unspecified: Secondary | ICD-10-CM | POA: Diagnosis present

## 2021-08-01 DIAGNOSIS — M549 Dorsalgia, unspecified: Secondary | ICD-10-CM | POA: Diagnosis not present

## 2021-08-01 DIAGNOSIS — Z885 Allergy status to narcotic agent status: Secondary | ICD-10-CM

## 2021-08-01 DIAGNOSIS — J45901 Unspecified asthma with (acute) exacerbation: Secondary | ICD-10-CM | POA: Diagnosis not present

## 2021-08-01 DIAGNOSIS — K3184 Gastroparesis: Secondary | ICD-10-CM | POA: Diagnosis present

## 2021-08-01 LAB — COMPREHENSIVE METABOLIC PANEL
ALT: 35 U/L (ref 0–44)
AST: 31 U/L (ref 15–41)
Albumin: 3.6 g/dL (ref 3.5–5.0)
Alkaline Phosphatase: 59 U/L (ref 38–126)
Anion gap: 8 (ref 5–15)
BUN: 33 mg/dL — ABNORMAL HIGH (ref 8–23)
CO2: 26 mmol/L (ref 22–32)
Calcium: 8.8 mg/dL — ABNORMAL LOW (ref 8.9–10.3)
Chloride: 102 mmol/L (ref 98–111)
Creatinine, Ser: 0.76 mg/dL (ref 0.44–1.00)
GFR, Estimated: >60 mL/min (ref >60)
Glucose, Bld: 135 mg/dL — ABNORMAL HIGH (ref 70–99)
Potassium: 4.2 mmol/L (ref 3.5–5.1)
Sodium: 136 mmol/L (ref 135–145)
Total Bilirubin: 2.1 mg/dL — ABNORMAL HIGH (ref 0.3–1.2)
Total Protein: 6.9 g/dL (ref 6.5–8.1)

## 2021-08-01 LAB — PROTIME-INR
INR: 1.4 — ABNORMAL HIGH (ref 0.8–1.2)
Prothrombin Time: 16.8 seconds — ABNORMAL HIGH (ref 11.4–15.2)

## 2021-08-01 LAB — CBC WITH DIFFERENTIAL/PLATELET
Abs Immature Granulocytes: 0.02 10*3/uL (ref 0.00–0.07)
Basophils Absolute: 0 10*3/uL (ref 0.0–0.1)
Basophils Relative: 3 %
Eosinophils Absolute: 0 10*3/uL (ref 0.0–0.5)
Eosinophils Relative: 0 %
HCT: 44.3 % (ref 36.0–46.0)
Hemoglobin: 14.9 g/dL (ref 12.0–15.0)
Immature Granulocytes: 3 %
Lymphocytes Relative: 64 %
Lymphs Abs: 0.4 10*3/uL — ABNORMAL LOW (ref 0.7–4.0)
MCH: 30.1 pg (ref 26.0–34.0)
MCHC: 33.6 g/dL (ref 30.0–36.0)
MCV: 89.5 fL (ref 80.0–100.0)
Monocytes Absolute: 0.1 10*3/uL (ref 0.1–1.0)
Monocytes Relative: 11 %
Neutro Abs: 0.1 10*3/uL — CL (ref 1.7–7.7)
Neutrophils Relative %: 19 %
Platelets: 189 10*3/uL (ref 150–400)
RBC: 4.95 MIL/uL (ref 3.87–5.11)
RDW: 12.5 % (ref 11.5–15.5)
Smear Review: NORMAL
WBC: 0.6 10*3/uL — CL (ref 4.0–10.5)
nRBC: 0 % (ref 0.0–0.2)

## 2021-08-01 LAB — LACTIC ACID, PLASMA
Lactic Acid, Venous: 5.7 mmol/L (ref 0.5–1.9)
Lactic Acid, Venous: 1.8 mmol/L (ref 0.5–1.9)

## 2021-08-01 LAB — RESP PANEL BY RT-PCR (FLU A&B, COVID) ARPGX2
Influenza A by PCR: NEGATIVE
Influenza B by PCR: NEGATIVE
SARS Coronavirus 2 by RT PCR: POSITIVE — AB

## 2021-08-01 LAB — BLOOD GAS, VENOUS
Acid-Base Excess: 3.3 mmol/L — ABNORMAL HIGH (ref 0.0–2.0)
Bicarbonate: 27.8 mmol/L (ref 20.0–28.0)
O2 Saturation: 74 %
Patient temperature: 37
pCO2, Ven: 41 mmHg — ABNORMAL LOW (ref 44–60)
pH, Ven: 7.44 — ABNORMAL HIGH (ref 7.25–7.43)
pO2, Ven: 40 mmHg (ref 32–45)

## 2021-08-01 LAB — URINALYSIS, ROUTINE W REFLEX MICROSCOPIC
Glucose, UA: NEGATIVE mg/dL
Hgb urine dipstick: NEGATIVE
Ketones, ur: NEGATIVE mg/dL
Leukocytes,Ua: NEGATIVE
Nitrite: POSITIVE — AB
Protein, ur: NEGATIVE mg/dL
Specific Gravity, Urine: 1.025 (ref 1.005–1.030)
pH: 5.5 (ref 5.0–8.0)

## 2021-08-01 LAB — FIBRINOGEN: Fibrinogen: 235 mg/dL (ref 210–475)

## 2021-08-01 LAB — SODIUM, URINE, RANDOM: Sodium, Ur: 97 mmol/L

## 2021-08-01 LAB — PHOSPHORUS: Phosphorus: 1.9 mg/dL — ABNORMAL LOW (ref 2.5–4.6)

## 2021-08-01 LAB — URINALYSIS, MICROSCOPIC (REFLEX)

## 2021-08-01 LAB — CREATININE, URINE, RANDOM: Creatinine, Urine: 90.18 mg/dL

## 2021-08-01 LAB — LIPASE, BLOOD: Lipase: 28 U/L (ref 11–51)

## 2021-08-01 LAB — CK: Total CK: 32 U/L — ABNORMAL LOW (ref 38–234)

## 2021-08-01 LAB — MAGNESIUM: Magnesium: 2.1 mg/dL (ref 1.7–2.4)

## 2021-08-01 MED ORDER — ROSUVASTATIN CALCIUM 10 MG PO TABS
10.0000 mg | ORAL_TABLET | Freq: Every day | ORAL | Status: DC
  Administered 2021-08-01 – 2021-08-06 (×6): 10 mg via ORAL
  Filled 2021-08-01 (×6): qty 1

## 2021-08-01 MED ORDER — SODIUM CHLORIDE 0.9 % IV SOLN: 2.0000 g | Freq: Once | INTRAVENOUS | Status: DC

## 2021-08-01 MED ORDER — SODIUM CHLORIDE 0.9 % IV BOLUS
500.0000 mL | Freq: Once | INTRAVENOUS | Status: AC
  Administered 2021-08-01: 500 mL via INTRAVENOUS

## 2021-08-01 MED ORDER — FENOFIBRATE 160 MG PO TABS
160.0000 mg | ORAL_TABLET | Freq: Every day | ORAL | Status: DC
  Administered 2021-08-01 – 2021-08-06 (×6): 160 mg via ORAL
  Filled 2021-08-01 (×7): qty 1

## 2021-08-01 MED ORDER — VANCOMYCIN HCL 1250 MG/250ML IV SOLN
1250.0000 mg | INTRAVENOUS | Status: DC
  Administered 2021-08-02: 1250 mg via INTRAVENOUS
  Filled 2021-08-01 (×2): qty 250

## 2021-08-01 MED ORDER — ACETAMINOPHEN 650 MG RE SUPP: 650.0000 mg | Freq: Four times a day (QID) | RECTAL | Status: DC | PRN

## 2021-08-01 MED ORDER — ACETAMINOPHEN 500 MG PO TABS: 1000.0000 mg | ORAL_TABLET | Freq: Once | ORAL | Status: AC

## 2021-08-01 MED ORDER — ALUM & MAG HYDROXIDE-SIMETH 200-200-20 MG/5ML PO SUSP
30.0000 mL | Freq: Once | ORAL | Status: AC
  Administered 2021-08-01: 30 mL via ORAL
  Filled 2021-08-01: qty 30

## 2021-08-01 MED ORDER — METRONIDAZOLE 500 MG/100ML IV SOLN
500.0000 mg | Freq: Once | INTRAVENOUS | Status: AC
Start: 2021-08-01 — End: 2021-08-01
  Administered 2021-08-01: 500 mg via INTRAVENOUS
  Filled 2021-08-01: qty 100

## 2021-08-01 MED ORDER — ACETAMINOPHEN 500 MG PO TABS
ORAL_TABLET | ORAL | Status: AC
  Administered 2021-08-01: 1000 mg via ORAL
  Filled 2021-08-01: qty 2

## 2021-08-01 MED ORDER — MORPHINE SULFATE (PF) 4 MG/ML IV SOLN
4.0000 mg | Freq: Once | INTRAVENOUS | Status: AC
  Administered 2021-08-01: 4 mg via INTRAVENOUS
  Filled 2021-08-01: qty 1

## 2021-08-01 MED ORDER — CLONAZEPAM 0.125 MG PO TBDP
0.2500 mg | ORAL_TABLET | Freq: Every day | ORAL | Status: DC
  Administered 2021-08-01 – 2021-08-06 (×6): 0.25 mg via ORAL
  Filled 2021-08-01 (×6): qty 2

## 2021-08-01 MED ORDER — POLYETHYLENE GLYCOL 3350 17 G PO PACK
17.0000 g | PACK | Freq: Every day | ORAL | Status: DC | PRN
  Administered 2021-08-02: 17 g via ORAL
  Filled 2021-08-01: qty 1

## 2021-08-01 MED ORDER — KETOROLAC TROMETHAMINE 15 MG/ML IJ SOLN
15.0000 mg | Freq: Once | INTRAMUSCULAR | Status: AC
  Administered 2021-08-01: 15 mg via INTRAVENOUS
  Filled 2021-08-01: qty 1

## 2021-08-01 MED ORDER — SODIUM CHLORIDE 0.9 % IV SOLN
2.0000 g | Freq: Once | INTRAVENOUS | Status: AC
  Administered 2021-08-01: 2 g via INTRAVENOUS
  Filled 2021-08-01: qty 12.5

## 2021-08-01 MED ORDER — SODIUM CHLORIDE 0.9 % IV BOLUS
1000.0000 mL | Freq: Once | INTRAVENOUS | Status: AC
Start: 2021-08-01 — End: 2021-08-01
  Administered 2021-08-01: 1000 mL via INTRAVENOUS

## 2021-08-01 MED ORDER — SODIUM CHLORIDE 0.9 % IV SOLN
2.0000 g | Freq: Three times a day (TID) | INTRAVENOUS | Status: DC
  Administered 2021-08-02 – 2021-08-03 (×6): 2 g via INTRAVENOUS
  Filled 2021-08-01 (×8): qty 12.5

## 2021-08-01 MED ORDER — HYDROCODONE-ACETAMINOPHEN 5-325 MG PO TABS
1.0000 | ORAL_TABLET | ORAL | Status: DC | PRN
  Filled 2021-08-01 (×2): qty 1

## 2021-08-01 MED ORDER — METRONIDAZOLE 500 MG/100ML IV SOLN
500.0000 mg | Freq: Two times a day (BID) | INTRAVENOUS | Status: DC
  Administered 2021-08-02 – 2021-08-04 (×5): 500 mg via INTRAVENOUS
  Filled 2021-08-01 (×5): qty 100

## 2021-08-01 MED ORDER — FENTANYL CITRATE PF 50 MCG/ML IJ SOSY
50.0000 ug | PREFILLED_SYRINGE | Freq: Once | INTRAMUSCULAR | Status: AC
  Administered 2021-08-01: 50 ug via INTRAVENOUS
  Filled 2021-08-01: qty 1

## 2021-08-01 MED ORDER — SODIUM CHLORIDE 0.9 % IV SOLN
Freq: Once | INTRAVENOUS | Status: DC
Start: 2021-08-01 — End: 2021-08-01

## 2021-08-01 MED ORDER — DOCUSATE SODIUM 100 MG PO CAPS
100.0000 mg | ORAL_CAPSULE | Freq: Two times a day (BID) | ORAL | Status: DC
  Administered 2021-08-01 – 2021-08-02 (×2): 100 mg via ORAL
  Filled 2021-08-01 (×4): qty 1

## 2021-08-01 MED ORDER — LIDOCAINE VISCOUS HCL 2 % MT SOLN
15.0000 mL | Freq: Once | OROMUCOSAL | Status: AC
  Administered 2021-08-01: 15 mL via ORAL
  Filled 2021-08-01: qty 15

## 2021-08-01 MED ORDER — METRONIDAZOLE 500 MG/100ML IV SOLN: 500.0000 mg | Freq: Two times a day (BID) | INTRAVENOUS | Status: DC

## 2021-08-01 MED ORDER — ACETAMINOPHEN 325 MG PO TABS
650.0000 mg | ORAL_TABLET | Freq: Four times a day (QID) | ORAL | Status: DC | PRN
  Administered 2021-08-03 – 2021-08-04 (×5): 650 mg via ORAL
  Filled 2021-08-01 (×5): qty 2

## 2021-08-01 MED ORDER — SODIUM CHLORIDE 0.9 % IV SOLN
75.0000 mL/h | INTRAVENOUS | Status: AC
  Administered 2021-08-02: 75 mL/h via INTRAVENOUS

## 2021-08-01 MED ORDER — ALBUTEROL SULFATE HFA 108 (90 BASE) MCG/ACT IN AERS: 2.0000 | INHALATION_SPRAY | RESPIRATORY_TRACT | Status: DC | PRN

## 2021-08-01 MED ORDER — SODIUM CHLORIDE 0.9 % IV SOLN: INTRAVENOUS | Status: DC | PRN

## 2021-08-01 MED ORDER — SODIUM CHLORIDE 0.9 % IV SOLN: INTRAVENOUS | Status: DC

## 2021-08-01 MED ORDER — VANCOMYCIN HCL 1750 MG/350ML IV SOLN
1750.0000 mg | Freq: Once | INTRAVENOUS | Status: AC
  Administered 2021-08-01: 1750 mg via INTRAVENOUS
  Filled 2021-08-01 (×2): qty 350

## 2021-08-01 MED ORDER — SENNA 8.6 MG PO TABS
1.0000 | ORAL_TABLET | Freq: Two times a day (BID) | ORAL | Status: DC
  Administered 2021-08-01 – 2021-08-02 (×2): 8.6 mg via ORAL
  Filled 2021-08-01 (×4): qty 1

## 2021-08-01 MED ORDER — BISACODYL 5 MG PO TBEC
5.0000 mg | DELAYED_RELEASE_TABLET | Freq: Once | ORAL | Status: AC
  Administered 2021-08-01: 5 mg via ORAL
  Filled 2021-08-01: qty 1

## 2021-08-01 MED ORDER — TRAMADOL HCL 50 MG PO TABS
50.0000 mg | ORAL_TABLET | Freq: Four times a day (QID) | ORAL | Status: DC | PRN
  Administered 2021-08-01 – 2021-08-02 (×3): 50 mg via ORAL
  Filled 2021-08-01 (×4): qty 1

## 2021-08-01 NOTE — ED Provider Notes (Signed)
?[facility] EMERGENCY DEPARTMENT ?Provider Note ? ? ?CSN: [id number] ?Arrival date & time: 08/01/21  1256 ? ?  ? ?History ? ?Chief Complaint  ?Patient presents with  ? Back Pain  ? ? ?Christie Thomas is a 66 y.o. female with a past medical history significant for hyperlipidemia, gastroparesis, cystitis, IBS, and recently stage Ib breast cancer who just began chemotherapy on Tuesday who presents with constipation, dysuria, decreased ability to urinate, low back pain, malaise, occasional chills with no objective fever.  Patient reports that she has been taking tramadol, has not had much to drink secondary to nausea and general malaise.  No previous history with constipation.  She has had C-section, cholecystectomy in the past, denies other intra-abdominal surgeries. ? ?Recently came back from a trip in the [place], was COVID-positive, she reports symptoms are improved at this time, she did not take any antivirals or other treatment.  She denies any ongoing shortness of breath, fever, chills, cough, sore throat. ? ?Back Pain ? ?  ? ?Home Medications ?Prior to Admission medications   ?Medication Sig Start Date End Date Taking? Authorizing Provider  ?albuterol (PROVENTIL HFA;VENTOLIN HFA) 108 (90 Base) MCG/ACT inhaler Inhale 2 puffs into the lungs every 4 (four) hours as needed. ?Patient not taking: Reported on 07/27/2021 12/07/15   Midge Minium, MD  ?beclomethasone (QVAR) 40 MCG/ACT inhaler Inhale 1 puff into the lungs 2 (two) times daily as needed (shortness of breath). ?Patient not taking: Reported on 07/27/2021    [provider]  ?clonazePAM (KLONOPIN) 0.5 MG tablet TAKE 1 TABLET 3 TIMES DAILY AS NEEDED FOR ANXIETY. ?Patient not taking: Reported on 07/27/2021 06/01/21   Midge Minium, MD  ?dexamethasone (DECADRON) 4 MG tablet Take 2 tablets (8 mg total) by mouth 2 (two) times daily. Start the day before Taxotere. Then again the day after chemo for 3 days. 07/16/21   Volanda Napoleon, MD  ?fenofibrate 160 MG tablet TAKE 1 TABLET ONCE DAILY WITH FOOD. 05/19/21   Midge Minium, MD  ?fluticasone (FLONASE) 50 MCG/ACT nasal spray instill 1 to 2 sprays into each nostril daily ?Patient taking differently: Place 1-2 sprays into both nostrils daily as needed for allergies. 02/15/13   Midge Minium, MD  ?lidocaine-prilocaine (EMLA) cream Apply to affected area once 07/16/21   Volanda Napoleon, MD  ?ondansetron (ZOFRAN) 8 MG tablet Take 1 tablet (8 mg total) by mouth 2 (two) times daily as needed for refractory nausea / vomiting. Start on day 3 after chemo. ?Patient not taking: Reported on 07/27/2021 07/16/21   Volanda Napoleon, MD  ?oxyCODONE (ROXICODONE) 5 MG immediate release tablet Take 1 tablet (5 mg total) by mouth every 6 (six) hours as needed for severe pain. 07/26/21   Jovita Kussmaul, MD  ?Polyethyl Glycol-Propyl Glycol (SYSTANE OP) Place 1 drop into both eyes 2 (two) times daily as needed (dry/irritated eyes).    [provider]  ?prochlorperazine (COMPAZINE) 10 MG tablet Take 1 tablet (10 mg total) by mouth every 6 (six) hours as needed (Nausea or vomiting). ?Patient not taking: Reported on 07/27/2021 07/16/21   Volanda Napoleon, MD  ?rosuvastatin (CRESTOR) 10 MG tablet Take 1 tablet (10 mg total) by mouth daily. 07/20/21   Midge Minium, MD  ?traMADol (ULTRAM) 50 MG tablet Take 50 mg by mouth every 6 (six) hours as needed.    [provider]  ?   ? ?Allergies    ?Iodine, Penicillins, Shellfish allergy, Cephalexin, Ciprofloxacin,  and Metoclopramide   ? ?Review of Systems   ?Review of Systems  ?Musculoskeletal:  Positive for back pain.  ?All other systems reviewed and are negative. ? ?Physical Exam ?Updated Vital Signs ?BP 119/71   Pulse 79   Temp 98.6 ?F (37 ?C) (Oral)   Resp 20   SpO2 100%  ?Physical Exam ?Vitals and nursing note reviewed.  ?Constitutional:   ?   General: She is not in acute distress. ?   Appearance: Normal appearance. She is ill-appearing.  ?HENT:   ?   Head: Normocephalic and atraumatic.  ?Eyes:  ?   General:     ?   Right eye: No discharge.     ?   Left eye: No discharge.  ?Cardiovascular:  ?   Rate and Rhythm: Normal rate and regular rhythm.  ?   Heart sounds: No murmur heard. ?  No friction rub. No gallop.  ?Pulmonary:  ?   Effort: Pulmonary effort is normal.  ?   Breath sounds: Normal breath sounds.  ?Abdominal:  ?   General: Bowel sounds are normal.  ?   Palpations: Abdomen is soft.  ?   Comments: Somewhat distended, tender to palpation, radiation of pain to the flanks, without CVA tenderness  ?Skin: ?   General: Skin is warm and dry.  ?   Capillary Refill: Capillary refill takes less than 2 seconds.  ?Neurological:  ?   Mental Status: She is alert and oriented to person, place, and time.  ?Psychiatric:     ?   Mood and Affect: Mood normal.     ?   Behavior: Behavior normal.  ? ? ?ED Results / Procedures / Treatments   ?Labs ?(all labs ordered are listed, but only abnormal results are displayed) ?Labs Reviewed  ?URINALYSIS, ROUTINE W REFLEX MICROSCOPIC - Abnormal; Notable for the following components:  ?    Result Value  ? Bilirubin Urine SMALL (*)   ? Nitrite POSITIVE (*)   ? All other components within normal limits  ?CBC WITH DIFFERENTIAL/PLATELET - Abnormal; Notable for the following components:  ? WBC 0.6 (*)   ? Neutro Abs 0.1 (*)   ? Lymphs Abs 0.4 (*)   ? All other components within normal limits  ?COMPREHENSIVE METABOLIC PANEL - Abnormal; Notable for the following components:  ? Glucose, Bld 135 (*)   ? BUN 33 (*)   ? Calcium 8.8 (*)   ? Total Bilirubin 2.1 (*)   ? All other components within normal limits  ?URINALYSIS, MICROSCOPIC (REFLEX) - Abnormal; Notable for the following components:  ? Bacteria, UA MANY (*)   ? All other components within normal limits  ?RESP PANEL BY RT-PCR (FLU A&B, COVID) [id number]  ?GASTROINTESTINAL PANEL BY PCR, STOOL (REPLACES STOOL CULTURE)  ?C DIFFICILE QUICK SCREEN W PCR REFLEX    ?LIPASE, BLOOD   ? ? ?EKG ?None ? ?Radiology ?CT ABDOMEN PELVIS WO CONTRAST ? ?Result Date: 08/01/2021 ?CLINICAL DATA:  Abdominal pain, recently initiated chemotherapy for breast cancer * Tracking Code: BO * EXAM: CT ABDOMEN AND PELVIS WITHOUT CONTRAST TECHNIQUE: Multidetector CT imaging of the abdomen and pelvis was performed following the standard protocol without IV contrast. RADIATION DOSE REDUCTION: This exam was performed according to the departmental dose-optimization program which includes automated exposure control, adjustment of the mA and/or kV according to patient size and/or use of iterative reconstruction technique. COMPARISON:  11/27/2007 FINDINGS: Lower chest: No acute abnormality.  Bilateral breast implants. Hepatobiliary: No focal liver abnormality is seen.  Status post cholecystectomy. No biliary dilatation. Pancreas: Unremarkable. No pancreatic ductal dilatation or surrounding inflammatory changes. Spleen: Normal in size without significant abnormality. Adrenals/Urinary Tract: Adrenal glands are unremarkable. Simple, benign exophytic cyst of the anterior superior pole of the right kidney, for which no further follow-up or characterization is required (series 2, image 32). Kidneys are otherwise normal, without renal calculi, solid lesion, or hydronephrosis. Bladder is unremarkable. Stomach/Bowel: Stomach is within normal limits. Appendix appears normal. Descending and sigmoid diverticulosis. Large burden of stool in the colon proximal to the rectosigmoid junction. Fat stranding and inflammatory fluid about the diverticular proximal sigmoid (series 2, image 66). Vascular/Lymphatic: Aortic atherosclerosis. No enlarged abdominal or pelvic lymph nodes. Stable, benign subcentimeter retroperitoneal lymph nodes (series 2, image 29, 32). Reproductive: No mass or other significant abnormality. Other: No abdominal wall hernia or abnormality. No ascites. Musculoskeletal: No acute or significant osseous findings. IMPRESSION:  1. Descending and sigmoid diverticulosis. Fat stranding and inflammatory fluid about the diverticular proximal sigmoid, consistent with acute diverticulitis. No evidence of perforation or abscess. 2. Large burden of stool in t

## 2021-08-01 NOTE — ED Triage Notes (Signed)
Pt reports starting chemo on Tuesday. Now c/o lower back pain, constipation and dysuria. Pt reports "trickling" and burning with urination. Last [facility] Wednesday.  ?

## 2021-08-01 NOTE — ED Notes (Signed)
Port was accessed at 1755; unable to back-time on assessment ?

## 2021-08-01 NOTE — Assessment & Plan Note (Signed)
Continue PPI ?

## 2021-08-01 NOTE — Assessment & Plan Note (Addendum)
? -  SIRS criteria met with neutropenia ?   ?Component Value Date/Time  ? WBC 0.6 (LL) 08/01/2021 1600  ? LYMPHSABS 0.4 (L) 08/01/2021 1600  ? ? ? tachycardia   ,  fever   RR >20 ?Today's Vitals  ? 08/01/21 1945 08/01/21 1945 08/01/21 2015 08/01/21 2116  ?BP: 136/64  132/65 122/66  ?Pulse: (!) 127  (!) 119 100  ?Resp: (!) 28  (!) 28 20  ?Temp:    (!) 100.6 ?F (38.1 ?C)  ?TempSrc:    Oral  ?SpO2: 100%  100% 97%  ?Weight:   87.5 kg   ?Height:   5' 4" (1.626 m)   ?PainSc:  1     ? ?  ?  ?The recent clinical data is shown below. ?Vitals:  ? 08/01/21 1910 08/01/21 1945 08/01/21 2015 08/01/21 2116  ?BP: (!) 147/63 136/64 132/65 122/66  ?Pulse: (!) 132 (!) 127 (!) 119 100  ?Resp: (!) 54 (!) 28 (!) 28 20  ?Temp:    (!) 100.6 ?F (38.1 ?C)  ?TempSrc:    Oral  ?SpO2: 100% 100% 100% 97%  ?Weight:   87.5 kg   ?Height:   5' 4" (1.626 m)   ? ? ? -Most likely source being:  Urinary, , intra-abdominal, neutropenia ? Patient meeting criteria for Severe sepsis with  ?  evidence of end organ damage/organ dysfunction such as  ?  elevated lactic acid >2  ?   ?Component Value Date/Time  ? LATICACIDVEN 5.7 ([place]) 08/01/2021 1940  ?  - Obtain serial lactic acid and procalcitonin level. ? - Initiated IV antibiotics in ER: ?Antibiotics Given (last 72 hours)   ? Date/Time Action Medication Dose Rate  ? 08/01/21 1740 New Bag/Given  ? ceFEPIme (MAXIPIME) 2 g in sodium chloride 0.9 % 100 mL IVPB 2 g 200 mL/hr  ? 08/01/21 1832 New Bag/Given  ? metroNIDAZOLE (FLAGYL) IVPB 500 mg 500 mg 100 mL/hr  ? 08/01/21 2138 New Bag/Given  ? vancomycin (VANCOREADY) IVPB 1750 mg/350 mL 1,750 mg 175 mL/hr  ?  ? ? ?Will continue  on : Cefepime vancomycin, falgyl ? ? - await results of blood and urine culture ? - Rehydrate aggressively  ?Intravenous fluids were administered, ? ?9:46 PM ? ? ?

## 2021-08-01 NOTE — ED Notes (Addendum)
NRB decreased to 10L/min by EDP ?

## 2021-08-01 NOTE — ED Notes (Signed)
Spoke with pharmacist at [facility]; ok to hold Vancomycin here since we do not have the 1750 mg dose; [facility] is here to transport pt ?

## 2021-08-01 NOTE — Assessment & Plan Note (Signed)
Chronic stable continue home medications ?

## 2021-08-01 NOTE — ED Notes (Signed)
O2 dc'd; will cont to monitor ? ?

## 2021-08-01 NOTE — ED Provider Notes (Addendum)
Update note ? ?Christie Thomas is a 66 year old lady with breast cancer who was recently started on chemotherapy.  See Darrick Meigs PA note for full ER documentation.  Work-up was concerning for diverticulitis, UTI, neutropenia.  No fever and well-appearing with normal vitals.  Started on cefepime and Flagyl for diverticulitis and UTI.  Consulted hospitalist and oncology.  While she was awaiting admission, I was called to bedside.  Patient was tachycardic, shaking, alert, appeared to be uncomfortable.  Her axillary temperature was 100.3 ?F.  Concern for sepsis, febrile neutropenia.  Provided antipyretic, pain medicine, IV fluids, ordered blood cultures, lactic acid.  I updated admitting hospitalist team, discussed with Dr. Marlowe Sax to request pt be upgraded to higher level of care (was originally assigned med surg). She will change to progressive bed.  After receiving those initial therapies, patient appears much improved but still is somewhat tachycardic.  BP is still stable.  Dr. Marlowe Sax suggested adding vanc for now as well, placed consult to pharm for vanc dosing. Husband at bedside updated.   Note blood cultures or lactic acid were not originally ordered because patient was well appearing, had normal vitals and had lower suspicion for sepsis.  ? ?CRITICAL CARE ?Performed by: Lucrezia Starch ? ? ?Total critical care time: 39 minutes ? ?Critical care time was exclusive of separately billable procedures and treating other patients. ? ?Critical care was necessary to treat or prevent imminent or life-threatening deterioration. ? ?Critical care was time spent personally by me on the following activities: development of treatment plan with patient and/or surrogate as well as nursing, discussions with consultants, evaluation of patient's response to treatment, examination of patient, obtaining history from patient or surrogate, ordering and performing treatments and interventions, ordering and review of laboratory  studies, ordering and review of radiographic studies, pulse oximetry and re-evaluation of patient's condition. ? ?  ?Lucrezia Starch, MD ?08/01/21 1936 ? ?  ?Lucrezia Starch, MD ?08/01/21 1938 ? ?

## 2021-08-01 NOTE — Assessment & Plan Note (Addendum)
Broad-spectrum antibiotic coverage cefepime and vancomycin ?Flagyl was DC'd when patient was having rigors. ?Oncology aware defer to oncology if Granix would be indicated ?

## 2021-08-01 NOTE — Assessment & Plan Note (Addendum)
Patient reports recent infection 1 m ago currently feeling better ?Positive test but currently symptoms more consistent with diverticulitis UTI rather than COVID ?Test done at [facility] at this point unable to obtain cyclic threshold ?Check chest x-ray ?No respiratory complaints doubt active infection. ?Possibly residual trace COVID particles in the setting of neutropenia and difficulty in clearance ?

## 2021-08-01 NOTE — Subjective & Objective (Addendum)
Patient has history of breast cancer recently started on chemotherapy reports dysuria and burning with urination.  Denies any fever patient was endorsing constipation has been using tramadol for pain had a lot of nausea and malaise recent COVID infection but symptoms have improved since then no ongoing shortness of breath ? ?In emergency department and at [facility] patient was diagnosed with UTI and diverticulitis she was started on cefepime and Flagyl was ready to be admitted was also found to be neutropenic ?Patient suddenly developed fever up to 100.3 was noted to be having rigors tachycardic shaking her antibiotic coverage was broadened and she was changed to progressive care ?

## 2021-08-01 NOTE — Progress Notes (Signed)
Pharmacy Antibiotic Note ? ?Christie Thomas is a 66 y.o. female admitted on 08/01/2021 with  febrile neutropenia .  Pharmacy has been consulted for vancomycin dosing. ? ?Of note, patient is leukopenia and neutropenic. SCr is wnl. CrCl ~ 75 mL/min ? ?08/01/2021 @ 22:09 - Patient transferred to [facility] and pharmacy consulted to dose Cefepime as well. ? ?Plan: ?-Continue Vancomycin 1250 mg IV Q 24 hrs. Goal AUC 400-550.  Expected AUC: 520  SCr used: 0.8  ?- Cefepime 2gm IV q8h ?-Monitor CBC, renal fx, cultures and clinical progress ?-Vanc levels as indicated  ? ? ? ?Height: '5\' 4"'$  (162.6 cm) ?Weight: 87.5 kg (192 lb 14.4 oz) ?IBW/kg (Calculated) : 54.7 ? ?Temp (24hrs), Avg:99.8 ?F (37.7 ?C), Min:98.6 ?F (37 ?C), Max:100.6 ?F (38.1 ?C) ? ?Recent Labs  ?Lab 07/27/21 ?0901 08/01/21 ?1600 08/01/21 ?1940  ?WBC 18.5* 0.6*  --   ?CREATININE 0.96 0.76  --   ?LATICACIDVEN  --   --  5.7*  ? ?  ?Estimated Creatinine Clearance: 74 mL/min (by C-G formula based on SCr of 0.76 mg/dL).   ? ?Allergies  ?Allergen Reactions  ? Iodine Anaphylaxis  ? Penicillins Anaphylaxis  ?  Throat swelling ?  ? Shellfish Allergy Anaphylaxis  ? Cephalexin   ?  Other reaction(s): Unknown  ? Ciprofloxacin   ?  Other reaction(s): Unknown  ? Codeine Other (See Comments)  ?  headache  ? Metoclopramide Other (See Comments)  ? ? ?Antimicrobials this admission: ?Cefepime 5/14 >>  ?Vancomycin 5/14 >>  ? ?Dose adjustments this admission: ? ? ?Microbiology results: ?5/14 BCx:  ?5/14 Covid: POSITIVE ? ? ?Thank you for allowing pharmacy to be a part of this patient?s care. ? ?Leone Haven, PharmD ?08/01/2021 @ 22:11 ? ?

## 2021-08-01 NOTE — Assessment & Plan Note (Signed)
-   treat with cefepime ?      await results of urine culture and adjust antibiotic coverage as needed ? ?

## 2021-08-01 NOTE — ED Notes (Signed)
Pt called out and said she was in severe pain; EDP notified; orders received ?

## 2021-08-01 NOTE — Assessment & Plan Note (Addendum)
ForContinue cefepime we will discuss with patient if can try again flagyl ?Her reaction more consistent with rigors and a fever rather than a true allergic reaction. ?Pt agreeable to try Flagyl one more time ?

## 2021-08-01 NOTE — Assessment & Plan Note (Signed)
Chronic continue home meds ?

## 2021-08-01 NOTE — Assessment & Plan Note (Signed)
Followed by Dr. Marin Olp has been recently receiving chemotherapy ?Oncology will consult on her in the morning during her admission. ?

## 2021-08-01 NOTE — Progress Notes (Signed)
Pharmacy Antibiotic Note ? ?Christie Thomas is a 66 y.o. female admitted on 08/01/2021 with  febrile neutropenia .  Pharmacy has been consulted for vancomycin dosing. ? ?Of note, patient is leukopenia and neutropenic. SCr is wnl. CrCl ~ 75 mL/min ? ?Plan: ?-Vancomycin 1750 mg IV load followed by Vancomycin 1250 mg IV Q 24 hrs. Goal AUC 400-550. ?Expected AUC: 520 ?SCr used: 0.8  ?-Monitor CBC, renal fx, cultures and clinical progress ?-Vanc levels as indicated  ?-F/u maintenance gram negative therapy  ? ? ?  ? ?Temp (24hrs), Avg:99.5 ?F (37.5 ?C), Min:98.6 ?F (37 ?C), Max:100.3 ?F (37.9 ?C) ? ?Recent Labs  ?Lab 07/27/21 ?0901 08/01/21 ?1600  ?WBC 18.5* 0.6*  ?CREATININE 0.96 0.76  ?  ?Estimated Creatinine Clearance: 74.9 mL/min (by C-G formula based on SCr of 0.76 mg/dL).   ? ?Allergies  ?Allergen Reactions  ? Iodine Anaphylaxis  ? Penicillins Anaphylaxis  ?  Throat swelling ?  ? Shellfish Allergy Anaphylaxis  ? Cephalexin   ?  Other reaction(s): Unknown  ? Ciprofloxacin   ?  Other reaction(s): Unknown  ? Metoclopramide Other (See Comments)  ? ? ?Antimicrobials this admission: ?Cefepime 5/14 >>  ?Vancomycin 5/14 >>  ? ?Dose adjustments this admission: ? ? ?Microbiology results: ?5/14 BCx:  ? ? ?Thank you for allowing pharmacy to be a part of this patient?s care. ? ?Albertina Parr, PharmD., BCCCP ?Clinical Pharmacist ?Please refer to [facility] for unit-specific pharmacist  ? ?

## 2021-08-01 NOTE — ED Notes (Signed)
Pt noted to be shaking vigorously, having difficulty speaking and appears somewhat cyanotic; EDP called to bedside; other staff in to assist; pt placed on NRB; difficult to obtain sat s/t tremors; ST at 156 on monitor; Flagyl dc'd, port flushed w/ NS; axillary temp 100.3 ?

## 2021-08-01 NOTE — H&P (Signed)
? ? ?Christie Thomas TKP:[id number] DOB: 1955/05/19 DOA: 08/01/2021 ? ? ?  ?PCP: Midge Minium, MD   ?Outpatient Specialists:   ?  ? Oncology  Dr. Marin Olp ?GI Dr.    Juanita Craver, MD ?  ? ?Patient arrived to ER on 08/01/21 at 1256 ?Referred by Attending Toy Baker, MD ? ? ?Patient coming from:   ? home Lives  With family ?  ? ?Chief Complaint:   ?Chief Complaint  ?Patient presents with  ? Back Pain  ? ? ?HPI: ?Christie Thomas is a 66 y.o. female with medical history significant of  breast cancer recent chemotherapy, ?Rheumatoid arthritis, gastroparesis, HLD, fatty liver, ? ?Presented with   diverticulitis, her back was hurting her abdomen was hurting ?Patient has history of breast cancer recently started on chemotherapy reports dysuria and burning with urination.  Denies any fever patient was endorsing constipation has been using tramadol for pain had a lot of nausea and malaise recent COVID infection but symptoms have improved since then no ongoing shortness of breath ? ?In emergency department and at [facility] patient was diagnosed with UTI and diverticulitis she was started on cefepime and Flagyl was ready to be admitted was also found to be neutropenic ?Patient suddenly developed fever up to 100.3 was noted to be having rigors tachycardic shaking her antibiotic coverage was broadened and she was changed to progressive care ? She reports she had an allergic reaction to Flagyl ?She reports she was on a Cruise started to have symptoms  ?On 17th of April ?Denies any cough or runny nose ?She is feeling better now ? ? ?No ETOH no tobacco ? ? Initial COVID TEST  ?  POSITIVE,   ?Lab Results  ?Component Value Date  ? SARSCOV2NAA POSITIVE (A) 08/01/2021  ? [place] Not Detected 04/16/2020  ? ?  ?Regarding pertinent Chronic problems:   ? ? Hyperlipidemia -  on statins Crestor fenofibrate  ?Lipid Panel  ?   ?Component Value Date/Time  ? CHOL 131 08/11/2020 1102  ? TRIG 114.0  08/11/2020 1102  ? HDL 49.70 08/11/2020 1102  ? CHOLHDL 3 08/11/2020 1102  ? VLDL 22.8 08/11/2020 1102  ? LDLCALC 59 08/11/2020 1102  ? LDLDIRECT 96.2 02/26/2014 0953  ?  obesity-   ?BMI Readings from Last 1 Encounters:  ?08/01/21 33.11 kg/m?  ? ?  ?  Asthma -well   controlled on home inhalers/ nebs f ?              ?  Liver disease  - fatty liver disease followed by GI ?  ?While in ER: ?  ?Found to be meeting sepsis criteria and neutropenia starting broad-spectrum antibiotics source possible UTI ?CT also positive for acute diverticulitis constipation ? ? ?Ordered ?   ?CXR -  NON acute ? ?CTabd/pelvis -   ?1. Descending and sigmoid diverticulosis. Fat stranding and ?inflammatory fluid about the diverticular proximal sigmoid, ?consistent with acute diverticulitis. No evidence of perforation or ?abscess. ?2. Large burden of stool in the colon proximal to the rectosigmoid ?junction. ?3. No noncontrast evidence of lymphadenopathy or metastatic disease ?in the abdomen or pelvis. ?  ? ?Following Medications were ordered in ER: ?Medications  ?0.9 %  sodium chloride infusion ( Intravenous Paused 08/01/21 1740)  ?0.9 %  sodium chloride infusion ( Intravenous Stopped 08/01/21 1832)  ?vancomycin (VANCOREADY) IVPB 1750 mg/350 mL (1,750 mg Intravenous New Bag/Given 08/01/21 2138)  ?vancomycin (VANCOREADY) IVPB 1250 mg/250 mL (has no administration in time  range)  ?ketorolac (TORADOL) 15 MG/ML injection 15 mg (15 mg Intravenous Given 08/01/21 1557)  ?bisacodyl (DULCOLAX) EC tablet 5 mg (5 mg Oral Given 08/01/21 1604)  ?morphine (PF) 4 MG/ML injection 4 mg (4 mg Intravenous Given 08/01/21 1559)  ?ceFEPIme (MAXIPIME) 2 g in sodium chloride 0.9 % 100 mL IVPB (0 g Intravenous Stopped 08/01/21 1810)  ?metroNIDAZOLE (FLAGYL) IVPB 500 mg (0 mg Intravenous Stopped 08/01/21 1854)  ?morphine (PF) 4 MG/ML injection 4 mg (4 mg Intravenous Given 08/01/21 1826)  ?fentaNYL (SUBLIMAZE) injection 50 mcg (50 mcg Intravenous Given 08/01/21 1906)   ?acetaminophen (TYLENOL) tablet 1,000 mg (1,000 mg Oral Given 08/01/21 1905)  ?sodium chloride 0.9 % bolus 1,000 mL (1,000 mLs Intravenous New Bag/Given 08/01/21 1905)  ?alum & mag hydroxide-simeth (MAALOX/MYLANTA) 200-200-20 MG/5ML suspension 30 mL (30 mLs Oral Given 08/01/21 1942)  ?  And  ?lidocaine (XYLOCAINE) 2 % viscous mouth solution 15 mL (15 mLs Oral Given 08/01/21 1942)  ?sodium chloride 0.9 % bolus 1,000 mL (1,000 mLs Intravenous New Bag/Given 08/01/21 2021)  ?  ?_______________________________________________________ ?ER Provider Called:   ONCOLOGY   Dr. Marin Olp ?They Recommend admit to medicine   ?Will see in AM    ?  ?ED Triage Vitals  ?Enc Vitals Group  ?   BP 08/01/21 1307 130/69  ?   Pulse Rate 08/01/21 1307 76  ?   Resp 08/01/21 1307 17  ?   Temp 08/01/21 1307 98.6 ?F (37 ?C)  ?   Temp Source 08/01/21 1307 Oral  ?   SpO2 08/01/21 1307 98 %  ?   Weight 08/01/21 2015 192 lb 14.4 oz (87.5 kg)  ?   Height 08/01/21 2015 '5\' 4"'$  (1.626 m)  ?   Head Circumference --   ?   Peak Flow --   ?   Pain Score 08/01/21 1310 5  ?   Pain Loc --   ?   Pain Edu? --   ?   Excl. in [place]? --   ?XNAT(55)@    ? _________________________________________ ?Significant initial  Findings: ?Abnormal Labs Reviewed  ?RESP PANEL BY RT-PCR (FLU A&B, COVID) ARPGX2 - Abnormal; Notable for the following components:  ?    Result Value  ? SARS Coronavirus 2 by RT PCR POSITIVE (*)   ? All other components within normal limits  ?URINALYSIS, ROUTINE W REFLEX MICROSCOPIC - Abnormal; Notable for the following components:  ? Bilirubin Urine SMALL (*)   ? Nitrite POSITIVE (*)   ? All other components within normal limits  ?CBC WITH DIFFERENTIAL/PLATELET - Abnormal; Notable for the following components:  ? WBC 0.6 (*)   ? Neutro Abs 0.1 (*)   ? Lymphs Abs 0.4 (*)   ? All other components within normal limits  ?COMPREHENSIVE METABOLIC PANEL - Abnormal; Notable for the following components:  ? Glucose, Bld 135 (*)   ? BUN 33 (*)   ? Calcium 8.8 (*)   ?  Total Bilirubin 2.1 (*)   ? All other components within normal limits  ?URINALYSIS, MICROSCOPIC (REFLEX) - Abnormal; Notable for the following components:  ? Bacteria, UA MANY (*)   ? All other components within normal limits  ?LACTIC ACID, PLASMA - Abnormal; Notable for the following components:  ? Lactic Acid, Venous 5.7 (*)   ? All other components within normal limits  ? ?  ?ECG: Ordered ?  ?  ?____________________ ?This patient meets SIRS Criteria and may be septic. ?   ?The recent clinical data is  shown below. ?Vitals:  ? 08/01/21 1910 08/01/21 1945 08/01/21 2015 08/01/21 2116  ?BP: (!) 147/63 136/64 132/65 122/66  ?Pulse: (!) 132 (!) 127 (!) 119 100  ?Resp: (!) 54 (!) 28 (!) 28 20  ?Temp:    (!) 100.6 ?F (38.1 ?C)  ?TempSrc:    Oral  ?SpO2: 100% 100% 100% 97%  ?Weight:   87.5 kg   ?Height:   '5\' 4"'$  (1.626 m)   ?  ?WBC ? ?   ?Component Value Date/Time  ? WBC 0.6 (LL) 08/01/2021 1600  ? LYMPHSABS 0.4 (L) 08/01/2021 1600  ? MONOABS 0.1 08/01/2021 1600  ? EOSABS 0.0 08/01/2021 1600  ? BASOSABS 0.0 08/01/2021 1600  ?  ?Lactic Acid, Venous ?   ?Component Value Date/Time  ? LATICACIDVEN 5.7 ([place]) 08/01/2021 1940  ?  ?Procalcitonin   Ordered ?Lactic Acid, Venous ?   ?Component Value Date/Time  ? LATICACIDVEN 1.8 08/01/2021 2300  ?  ? ? UA  evidence of UTI    ?  ?Urine analysis: ?   ?Component Value Date/Time  ? COLORURINE YELLOW 08/01/2021 1339  ? APPEARANCEUR CLEAR 08/01/2021 1339  ? LABSPEC 1.025 08/01/2021 1339  ? PHURINE 5.5 08/01/2021 1339  ? GLUCOSEU NEGATIVE 08/01/2021 1339  ? GLUCOSEU NEGATIVE 03/21/2019 1042  ? HGBUR NEGATIVE 08/01/2021 1339  ? BILIRUBINUR SMALL (A) 08/01/2021 1339  ? BILIRUBINUR negative 02/19/2019 1104  ? KETONESUR NEGATIVE 08/01/2021 1339  ? PROTEINUR NEGATIVE 08/01/2021 1339  ? UROBILINOGEN 0.2 03/21/2019 1042  ? NITRITE POSITIVE (A) 08/01/2021 1339  ? LEUKOCYTESUR NEGATIVE 08/01/2021 1339  ? ? ?Results for orders placed or performed during the hospital encounter of 08/01/21  ?Resp Panel  by RT-PCR (Flu A&B, Covid) Nasopharyngeal Swab     Status: Abnormal  ? Collection Time: 08/01/21  7:26 PM  ? Specimen: Nasopharyngeal Swab; Nasopharyngeal(NP) swabs in vial transport medium  ?Result Value Ref Range Status

## 2021-08-01 NOTE — ED Notes (Signed)
Lactic acid reported while transport on site to transfer patient. EDP notified and gave V/O for 1L NS bolus. Orders initiated prior to pt leaving facility. ?

## 2021-08-02 ENCOUNTER — Other Ambulatory Visit: Payer: Self-pay

## 2021-08-02 DIAGNOSIS — D701 Agranulocytosis secondary to cancer chemotherapy: Secondary | ICD-10-CM | POA: Diagnosis not present

## 2021-08-02 DIAGNOSIS — E7800 Pure hypercholesterolemia, unspecified: Secondary | ICD-10-CM

## 2021-08-02 DIAGNOSIS — R109 Unspecified abdominal pain: Secondary | ICD-10-CM | POA: Diagnosis not present

## 2021-08-02 DIAGNOSIS — J4520 Mild intermittent asthma, uncomplicated: Secondary | ICD-10-CM

## 2021-08-02 DIAGNOSIS — C50911 Malignant neoplasm of unspecified site of right female breast: Secondary | ICD-10-CM

## 2021-08-02 DIAGNOSIS — M549 Dorsalgia, unspecified: Secondary | ICD-10-CM

## 2021-08-02 DIAGNOSIS — D709 Neutropenia, unspecified: Secondary | ICD-10-CM

## 2021-08-02 DIAGNOSIS — K5792 Diverticulitis of intestine, part unspecified, without perforation or abscess without bleeding: Secondary | ICD-10-CM | POA: Diagnosis not present

## 2021-08-02 DIAGNOSIS — C50211 Malignant neoplasm of upper-inner quadrant of right female breast: Secondary | ICD-10-CM | POA: Diagnosis not present

## 2021-08-02 DIAGNOSIS — R197 Diarrhea, unspecified: Secondary | ICD-10-CM

## 2021-08-02 DIAGNOSIS — R5081 Fever presenting with conditions classified elsewhere: Secondary | ICD-10-CM

## 2021-08-02 DIAGNOSIS — K5900 Constipation, unspecified: Secondary | ICD-10-CM

## 2021-08-02 DIAGNOSIS — K2100 Gastro-esophageal reflux disease with esophagitis, without bleeding: Secondary | ICD-10-CM

## 2021-08-02 DIAGNOSIS — B370 Candidal stomatitis: Secondary | ICD-10-CM

## 2021-08-02 LAB — C-REACTIVE PROTEIN: CRP: 15.2 mg/dL — ABNORMAL HIGH (ref <1.0)

## 2021-08-02 LAB — PROCALCITONIN: Procalcitonin: 8.59 ng/mL

## 2021-08-02 LAB — CBC WITH DIFFERENTIAL/PLATELET
Abs Immature Granulocytes: 0.01 10*3/uL (ref 0.00–0.07)
Basophils Absolute: 0 10*3/uL (ref 0.0–0.1)
Basophils Relative: 1 %
Eosinophils Absolute: 0 10*3/uL (ref 0.0–0.5)
Eosinophils Relative: 1 %
HCT: 40.5 % (ref 36.0–46.0)
Hemoglobin: 13.8 g/dL (ref 12.0–15.0)
Immature Granulocytes: 1 %
Lymphocytes Relative: 57 %
Lymphs Abs: 0.4 10*3/uL — ABNORMAL LOW (ref 0.7–4.0)
MCH: 31.2 pg (ref 26.0–34.0)
MCHC: 34.1 g/dL (ref 30.0–36.0)
MCV: 91.6 fL (ref 80.0–100.0)
Monocytes Absolute: 0.2 10*3/uL (ref 0.1–1.0)
Monocytes Relative: 31 %
Neutro Abs: 0.1 10*3/uL — CL (ref 1.7–7.7)
Neutrophils Relative %: 9 %
Platelets: 123 10*3/uL — ABNORMAL LOW (ref 150–400)
RBC: 4.42 MIL/uL (ref 3.87–5.11)
RDW: 12.7 % (ref 11.5–15.5)
WBC: 0.7 10*3/uL — CL (ref 4.0–10.5)
nRBC: 0 % (ref 0.0–0.2)

## 2021-08-02 LAB — RESP PANEL BY RT-PCR (FLU A&B, COVID) ARPGX2
Influenza A by PCR: NEGATIVE
Influenza B by PCR: NEGATIVE
SARS Coronavirus 2 by RT PCR: NEGATIVE

## 2021-08-02 LAB — CORTISOL: Cortisol, Plasma: 7.2 ug/dL

## 2021-08-02 LAB — LACTIC ACID, PLASMA
Lactic Acid, Venous: 1.6 mmol/L (ref 0.5–1.9)
Lactic Acid, Venous: 2.4 mmol/L (ref 0.5–1.9)

## 2021-08-02 LAB — TSH: TSH: 0.572 u[IU]/mL (ref 0.350–4.500)

## 2021-08-02 LAB — FERRITIN: Ferritin: 338 ng/mL — ABNORMAL HIGH (ref 11–307)

## 2021-08-02 LAB — COMPREHENSIVE METABOLIC PANEL
ALT: 37 U/L (ref 0–44)
AST: 29 U/L (ref 15–41)
Albumin: 2.9 g/dL — ABNORMAL LOW (ref 3.5–5.0)
Alkaline Phosphatase: 47 U/L (ref 38–126)
Anion gap: 7 (ref 5–15)
BUN: 27 mg/dL — ABNORMAL HIGH (ref 8–23)
CO2: 22 mmol/L (ref 22–32)
Calcium: 7.6 mg/dL — ABNORMAL LOW (ref 8.9–10.3)
Chloride: 105 mmol/L (ref 98–111)
Creatinine, Ser: 0.64 mg/dL (ref 0.44–1.00)
GFR, Estimated: >60 mL/min (ref >60)
Glucose, Bld: 150 mg/dL — ABNORMAL HIGH (ref 70–99)
Potassium: 4.1 mmol/L (ref 3.5–5.1)
Sodium: 134 mmol/L — ABNORMAL LOW (ref 135–145)
Total Bilirubin: 2.1 mg/dL — ABNORMAL HIGH (ref 0.3–1.2)
Total Protein: 5.8 g/dL — ABNORMAL LOW (ref 6.5–8.1)

## 2021-08-02 LAB — PREALBUMIN: Prealbumin: 15.9 mg/dL — ABNORMAL LOW (ref 18–38)

## 2021-08-02 LAB — MAGNESIUM: Magnesium: 2.3 mg/dL (ref 1.7–2.4)

## 2021-08-02 LAB — D-DIMER, QUANTITATIVE: D-Dimer, Quant: 0.58 ug/mL-FEU — ABNORMAL HIGH (ref 0.00–0.50)

## 2021-08-02 LAB — HIV ANTIBODY (ROUTINE TESTING W REFLEX): HIV Screen 4th Generation wRfx: NONREACTIVE

## 2021-08-02 LAB — PHOSPHORUS: Phosphorus: 2.4 mg/dL — ABNORMAL LOW (ref 2.5–4.6)

## 2021-08-02 LAB — LACTATE DEHYDROGENASE: LDH: 133 U/L (ref 98–192)

## 2021-08-02 MED ORDER — MORPHINE SULFATE (PF) 2 MG/ML IV SOLN
2.0000 mg | INTRAVENOUS | Status: DC | PRN
  Administered 2021-08-02: 2 mg via INTRAVENOUS
  Filled 2021-08-02: qty 1

## 2021-08-02 MED ORDER — BIOTENE DRY MOUTH MT LIQD
15.0000 mL | Freq: Four times a day (QID) | OROMUCOSAL | Status: DC
  Administered 2021-08-02 – 2021-08-03 (×6): 15 mL via OROMUCOSAL

## 2021-08-02 MED ORDER — CHLORHEXIDINE GLUCONATE CLOTH 2 % EX PADS
6.0000 | MEDICATED_PAD | Freq: Every day | CUTANEOUS | Status: DC
  Administered 2021-08-02 – 2021-08-07 (×6): 6 via TOPICAL

## 2021-08-02 MED ORDER — FENTANYL CITRATE PF 50 MCG/ML IJ SOSY
25.0000 ug | PREFILLED_SYRINGE | INTRAMUSCULAR | Status: DC | PRN
  Administered 2021-08-02: 25 ug via INTRAVENOUS
  Filled 2021-08-02: qty 1

## 2021-08-02 MED ORDER — SODIUM CHLORIDE 0.9 % IV SOLN
75.0000 mL/h | INTRAVENOUS | Status: DC
  Administered 2021-08-02: 75 mL/h via INTRAVENOUS

## 2021-08-02 MED ORDER — SODIUM CHLORIDE 0.9% FLUSH
10.0000 mL | Freq: Two times a day (BID) | INTRAVENOUS | Status: DC
  Administered 2021-08-02 – 2021-08-05 (×4): 10 mL
  Administered 2021-08-06: 40 mL

## 2021-08-02 MED ORDER — NYSTATIN 100000 UNIT/ML MT SUSP
5.0000 mL | Freq: Four times a day (QID) | OROMUCOSAL | Status: DC
  Administered 2021-08-02 (×4): 500000 [IU] via OROMUCOSAL
  Filled 2021-08-02 (×4): qty 5

## 2021-08-02 MED ORDER — SORBITOL 70 % SOLN
300.0000 mL | TOPICAL_OIL | Freq: Once | ORAL | Status: AC
  Administered 2021-08-02: 300 mL via RECTAL
  Filled 2021-08-02: qty 90

## 2021-08-02 MED ORDER — ENOXAPARIN SODIUM 40 MG/0.4ML IJ SOSY
40.0000 mg | PREFILLED_SYRINGE | INTRAMUSCULAR | Status: DC
  Administered 2021-08-02 – 2021-08-06 (×5): 40 mg via SUBCUTANEOUS
  Filled 2021-08-02 (×5): qty 0.4

## 2021-08-02 MED ORDER — SODIUM CHLORIDE 0.9% FLUSH: 10.0000 mL | INTRAVENOUS | Status: DC | PRN

## 2021-08-02 MED ORDER — SODIUM CHLORIDE 0.9 % IV SOLN: INTRAVENOUS | Status: DC

## 2021-08-02 MED ORDER — PROCHLORPERAZINE EDISYLATE 10 MG/2ML IJ SOLN
10.0000 mg | Freq: Once | INTRAMUSCULAR | Status: AC
  Administered 2021-08-02: 10 mg via INTRAVENOUS
  Filled 2021-08-02: qty 2

## 2021-08-02 NOTE — Consult Note (Signed)
Referral MD ? ?Reason for Referral: Febrile neutropenia-acute diverticulitis; stage I high risk lobular carcinoma of the right breast ? ?Chief Complaint  ?Patient presents with  ? Back Pain  ?: I have a lot of abdominal pain. ? ?HPI: Christie Thomas is a 66 year old white female.  She is well-known to me.  She has a diagnosis of stage Ib infiltrating lobular of the right breast.  She had a very high Oncotype score.  We started her on adjuvant chemotherapy on 07/27/2021.  She Taxotere/Cytoxan. ? ?She did well with this.  She did get Neulasta. ? ?On Sunday she began to have abdominal pain.  She has some diarrhea before hand.  Since then, she has had no bowel movement. ? ?She went to the emergency room.  She had a CT scan done.  It looks like this was consistent with diverticulitis.  She has had diverticulitis in the past. ? ?Unfortunately, she is quite neutropenic.  White cell count 0.6.  Hemoglobin 14.9.  Platelet count 189,000.  Her ANC is 100. ? ?She had cultures taken.  So far they are normal. ? ?She did test positive for COVID.  She had COVID on her [place] cruise.  However, she tested negative afterwards. ? ?She has had no cough.  There is no sinus issues.  She has had a temperature. ? ?There is no bleeding.  She has had no urinary issues.  Urinalysis that was done did show bacteria and nitrite.  We will have to see what the cultures show. ? ?She is on broad-spectrum antibiotics. ? ?Her brother now is abdominal pain.  She has constipation.  On the CT scan, she had a lot of stool in the colon. ? ?There is no rash. ? ?I think there may have been an issue with Flagyl that she had. ? ?She has had no mouth sores.  She may have little bit of oral thrush. ? ?Overall, I would say performance status is probably ECOG 1. ? ? ?Past Medical History:  ?Diagnosis Date  ? Anxiety 01/20/2016  ? Asthma   ? Cancer ([place]) 02/09/2021  ? right breast  ? Diverticulitis 01/20/2016  ? Diverticulosis 01/20/2016  ? Epilepsy ([place])  01/20/2016  ? Adolescent   ? Fatty liver 01/20/2016  ? GERD (gastroesophageal reflux disease)   ? Hay fever   ? Hyperlipidemia   ? IBS (irritable bowel syndrome) 01/20/2016  ? Obesity 01/20/2016  ? OSA (obstructive sleep apnea) 09/07/2017  ? Osteoarthritis of both hands 01/20/2016  ? PONV (postoperative nausea and vomiting)   ? Swelling of ankle joint, left 01/20/2016  ? Trochanteric bursitis 01/20/2016  ?: ? ? ?Past Surgical History:  ?Procedure Laterality Date  ? AUGMENTATION MAMMAPLASTY    ? BACK SURGERY    ? x2  ? BREAST BIOPSY    ? fatty tumor  ? BREAST ENHANCEMENT SURGERY    ? BREAST LUMPECTOMY WITH RADIOACTIVE SEED AND SENTINEL LYMPH NODE BIOPSY Right 05/17/2021  ? Procedure: RIGHT BREAST LUMPECTOMY WITH RADIOACTIVE SEED;  Surgeon: Jovita Kussmaul, MD;  Location: [place];  Service: General;  Laterality: Right;  ? BREAST RECONSTRUCTION Bilateral 05/17/2021  ? Procedure: BREAST RECONSTRUCTION REMOVAL IMPLANTS AND REPLACEMENT OF IMPLANTS;  Surgeon: Wallace Going, DO;  Location: [place];  Service: Plastics;  Laterality: Bilateral;  ? BREAST REDUCTION SURGERY Bilateral 05/17/2021  ? Procedure: ONCOPLASTIC BREAST REDUCTION;  Surgeon: Wallace Going, DO;  Location: [place];  Service: Plastics;  Laterality: Bilateral;  ? CARDIAC CATHETERIZATION  07/2010  ? CARPAL  TUNNEL RELEASE    ? CATARACT EXTRACTION Bilateral   ? 2023  ? CESAREAN SECTION    ? CHOLECYSTECTOMY    ? HAND SURGERY    ? carpal tunnel release, cyst excision  ? PORTACATH PLACEMENT N/A 07/26/2021  ? Procedure: INSERTION PORT-A-CATH;  Surgeon: Jovita Kussmaul, MD;  Location: [place];  Service: General;  Laterality: N/A;  ? SENTINEL NODE BIOPSY Right 05/17/2021  ? Procedure: RIGHT SENTINEL LYMPH NODE BIOPSY WITH MAGTRACE INJECTION;  Surgeon: Jovita Kussmaul, MD;  Location: [place];  Service: General;  Laterality: Right;  ? SHOULDER SURGERY    ? right  ? TONSILLECTOMY AND ADENOIDECTOMY    ?: ? ? ?Current Facility-Administered Medications:  ?  0.9 %  sodium chloride  infusion, , Intravenous, PRN, Toy Baker, MD, Paused at 08/01/21 1740 ?  0.9 %  sodium chloride infusion, 75 mL/hr, Intravenous, Continuous, Doutova, Anastassia, MD, Last Rate: 75 mL/hr at 08/02/21 0344, 75 mL/hr at 08/02/21 0344 ?  acetaminophen (TYLENOL) tablet 650 mg, 650 mg, Oral, Q6H PRN **OR** acetaminophen (TYLENOL) suppository 650 mg, 650 mg, Rectal, Q6H PRN, Doutova, Anastassia, MD ?  ceFEPIme (MAXIPIME) 2 g in sodium chloride 0.9 % 100 mL IVPB, 2 g, Intravenous, Q8H, Doutova, Anastassia, MD, Last Rate: 200 mL/hr at 08/02/21 0336, 2 g at 08/02/21 0336 ?  Chlorhexidine Gluconate Cloth 2 % PADS 6 each, 6 each, Topical, Daily, Doutova, Anastassia, MD ?  clonazepam (KLONOPIN) disintegrating tablet 0.25 mg, 0.25 mg, Oral, QHS, Doutova, Anastassia, MD, 0.25 mg at 08/01/21 2349 ?  docusate sodium (COLACE) capsule 100 mg, 100 mg, Oral, BID, Doutova, Anastassia, MD, 100 mg at 08/01/21 2353 ?  fenofibrate tablet 160 mg, 160 mg, Oral, QHS, Doutova, Anastassia, MD, 160 mg at 08/01/21 2353 ?  HYDROcodone-acetaminophen (NORCO/VICODIN) 5-325 MG per tablet 1-2 tablet, 1-2 tablet, Oral, Q4H PRN, Doutova, Anastassia, MD ?  metroNIDAZOLE (FLAGYL) IVPB 500 mg, 500 mg, Intravenous, Q12H, Doutova, Anastassia, MD, Last Rate: 100 mL/hr at 08/02/21 0615, 500 mg at 08/02/21 0615 ?  morphine (PF) 2 MG/ML injection 2 mg, 2 mg, Intravenous, Q4H PRN, Irene Pap N, DO, 2 mg at 08/02/21 0350 ?  nystatin (MYCOSTATIN) 100000 UNIT/ML suspension 500,000 Units, 5 mL, Mouth/Throat, QID, Doutova, Anastassia, MD ?  polyethylene glycol (MIRALAX / GLYCOLAX) packet 17 g, 17 g, Oral, Daily PRN, Roel Cluck, Anastassia, MD, 17 g at 08/02/21 0635 ?  rosuvastatin (CRESTOR) tablet 10 mg, 10 mg, Oral, QHS, Doutova, Anastassia, MD, 10 mg at 08/01/21 2350 ?  senna (SENOKOT) tablet 8.6 mg, 1 tablet, Oral, BID, Doutova, Anastassia, MD, 8.6 mg at 08/01/21 2350 ?  traMADol (ULTRAM) tablet 50 mg, 50 mg, Oral, Q6H PRN, Doutova, Anastassia, MD, 50 mg at  08/02/21 2376 ?  vancomycin (VANCOREADY) IVPB 1250 mg/250 mL, 1,250 mg, Intravenous, Q24H, Doutova, Anastassia, MD: ? ? Chlorhexidine Gluconate Cloth  6 each Topical Daily  ? clonazepam  0.25 mg Oral QHS  ? docusate sodium  100 mg Oral BID  ? fenofibrate  160 mg Oral QHS  ? nystatin  5 mL Mouth/Throat QID  ? rosuvastatin  10 mg Oral QHS  ? senna  1 tablet Oral BID  ?: ? ? ?Allergies  ?Allergen Reactions  ? Iodine Anaphylaxis  ? Penicillins Anaphylaxis  ?  Throat swelling ?  ? Shellfish Allergy Anaphylaxis  ? Cephalexin   ?  Other reaction(s): Unknown  ? Ciprofloxacin   ?  Other reaction(s): Unknown  ? Codeine Other (See Comments)  ?  headache  ?  Metoclopramide Other (See Comments)  ?: ? ? ?Family History  ?Problem Relation Age of Onset  ? Stroke Mother   ? Hypertension Mother   ? Hyperlipidemia Mother   ? Diabetes Mother   ? Cancer Mother   ? Sudden death Paternal Grandfather 81  ? Alzheimer's disease Father   ? Heart attack Neg Hx   ?: ? ? ?Social History  ? ?Socioeconomic History  ? Marital status: Married  ?  Spouse name: Not on file  ? Number of children: 1  ? Years of education: Not on file  ? Highest education level: Not on file  ?Occupational History  ? Not on file  ?Tobacco Use  ? Smoking status: Never  ? Smokeless tobacco: Never  ?Vaping Use  ? Vaping Use: Never used  ?Substance and Sexual Activity  ? Alcohol use: Not Currently  ? Drug use: No  ? Sexual activity: Not Currently  ?  Birth control/protection: Post-menopausal  ?Other Topics Concern  ? Not on file  ?Social History Narrative  ? Not on file  ? ?Social Determinants of Health  ? ?Financial Resource Strain: Not on file  ?Food Insecurity: Not on file  ?Transportation Needs: Not on file  ?Physical Activity: Not on file  ?Stress: Not on file  ?Social Connections: Not on file  ?Intimate Partner Violence: Not on file  ?: ? ?Review of Systems  ?Constitutional:  Positive for malaise/fatigue.  ?HENT: Negative.    ?Eyes: Negative.   ?Respiratory: Negative.     ?Cardiovascular: Negative.   ?Gastrointestinal:  Positive for abdominal pain and constipation.  ?Genitourinary: Negative.   ?Musculoskeletal: Negative.   ?Skin: Negative.   ?Neurological: Negative.   ?Endo/

## 2021-08-02 NOTE — TOC Initial Note (Signed)
Transition of Care (TOC) - Initial/Assessment Note  ? ? ?Patient Details  ?Name: Christie Thomas ?MRN: [id number] ?Date of Birth: 06/20/1955 ? ?Transition of Care [facility]) CM/SW Contact:    ?Leeroy Cha, RN ?Phone Number: ?08/02/2021, 9:09 AM ? ?Clinical Narrative:                 ? ?Transition of Care (TOC) Screening Note ? ? ?Patient Details  ?Name: Christie Thomas ?Date of Birth: February 07, 1956 ? ? ?Transition of Care [facility]) CM/SW Contact:    ?Leeroy Cha, RN ?Phone Number: ?08/02/2021, 9:09 AM ? ? ? ?Transition of Care Department [facility]) has reviewed patient and no TOC needs have been identified at this time. We will continue to monitor patient advancement through interdisciplinary progression rounds. If new patient transition needs arise, please place a TOC consult. ? ? ? ?Expected Discharge Plan: Home/Self Care ?Barriers to Discharge: No Barriers Identified ? ? ?Patient Goals and CMS Choice ?Patient states their goals for this hospitalization and ongoing recovery are:: to return to my home ?CMS Medicare.gov Compare Post Acute Care list provided to:: Patient ?  ? ?Expected Discharge Plan and Services ?Expected Discharge Plan: Home/Self Care ?  ?Discharge Planning Services: CM Consult ?  ?Living arrangements for the past 2 months: [place] ?                ?  ?  ?  ?  ?  ?  ?  ?  ?  ?  ? ?Prior Living Arrangements/Services ?Living arrangements for the past 2 months: [place] ?Lives with:: Spouse ?Patient language and need for interpreter reviewed:: Yes ?Do you feel safe going back to the place where you live?: Yes      ?  ?  ?  ?Criminal Activity/Legal Involvement Pertinent to Current Situation/Hospitalization: No - Comment as needed ? ?Activities of Daily Living ?Home Assistive Devices/Equipment: CBG Meter, Walker (specify type) ?ADL Screening (condition at time of admission) ?Patient's cognitive ability adequate to safely complete daily activities?: Yes ?Is the patient deaf or  have difficulty hearing?: No ?Does the patient have difficulty seeing, even when wearing glasses/contacts?: No ?Does the patient have difficulty concentrating, remembering, or making decisions?: No ?Patient able to express need for assistance with ADLs?: Yes ?Does the patient have difficulty dressing or bathing?: No ?Independently performs ADLs?: Yes (appropriate for developmental age) ?Does the patient have difficulty walking or climbing stairs?: No ?Weakness of Legs: None ?Weakness of Arms/Hands: None ? ?Permission Sought/Granted ?  ?  ?   ?   ?   ?   ? ?Emotional Assessment ?Appearance:: Appears stated age ?Attitude/Demeanor/Rapport: Engaged ?Affect (typically observed): Calm ?Orientation: : Oriented to Place, Oriented to Self, Oriented to  Time, Oriented to Situation ?Alcohol / Substance Use: Not Applicable ?Psych Involvement: No (comment) ? ?Admission diagnosis:  Neutropenia ([place]) [D70.9] ?Patient Active Problem List  ? Diagnosis Date Noted  ? Neutropenia ([place]) 08/01/2021  ? Sepsis secondary to UTI ([place]) 08/01/2021  ? UTI (urinary tract infection) 08/01/2021  ? Sepsis ([place]) 08/01/2021  ? COVID-19 virus infection 08/01/2021  ? Diverticulitis 08/01/2021  ? Breast cancer of upper-inner quadrant of right female breast ([place]) 03/05/2021  ? Rheumatoid arthritis of multiple sites with negative rheumatoid factor ([place]) 11/17/2016  ? High risk medication use 11/17/2016  ? Elevated LFTs/ Dr Collene Mares follows  11/17/2016  ? Anosmia 08/02/2016  ? High risk medications (not anticoagulants) long-term use 06/15/2016  ? ANA positive 01/21/2016  ? Swelling of  ankle joint, left 01/20/2016  ? Osteoarthritis of both hands 01/20/2016  ? GERD (gastroesophageal reflux disease) 01/20/2016  ? Diverticulosis 01/20/2016  ? Fatty liver 01/20/2016  ? IBS (irritable bowel syndrome) 01/20/2016  ? Obesity 01/20/2016  ? History of epilepsy 01/20/2016  ? Cystitis 11/01/2014  ? Routine general medical examination at a health care facility 05/29/2012  ?  Caregiver stress 01/12/2011  ? Hyperlipidemia 08/10/2010  ? Gastroparesis 08/10/2010  ? Asthma 08/10/2010  ? Allergic rhinitis 08/10/2010  ? ?PCP:  Midge Minium, MD ?Pharmacy:   ?[facility] [place] [address] ?[address] ?[place] [address] ?Phone: [phone] Fax: [phone] ? ?[id number] - [place], [place] ?[address] ?[place] [address] ?Phone: [phone] Fax: [phone] ? ? ? ? ?Social Determinants of Health (SDOH) Interventions ?  ? ?Readmission Risk Interventions ?   ? View : No data to display.  ?  ?  ?  ? ? ? ?

## 2021-08-02 NOTE — Progress Notes (Signed)
Pharmacy: [facility] for VTE px ? ?Patient is a 66 y.o F with breast cancer currently undergoing chemotherapy treatment who presented to the ED on 08/01/21 with c/o back pain and dysuria.  Pharmacy has been consulted to dose lovenox for VTE prophylaxis. ? ?- hgb pk ?- plts down 123K. (Had chemo on 07/27/21) ?- no bleeding documented ?- scr 0.64 ? ?Plan: ?- lovenox '40mg'$  SQ q24h ?- pharmacy will sign off for lovenox consult. ? ?Dia Sitter, PharmD, BCPS ?08/02/2021 9:01 AM ? ?

## 2021-08-02 NOTE — Progress Notes (Signed)
?PROGRESS NOTE ? ? ? ?Christie Thomas  QHU:[id number] DOB: 1955/08/06 DOA: 08/01/2021 ?PCP: Midge Minium, MD  ? ? ? ?Brief Narrative:  ?66 y.o. WF PMHx  breast cancer recent chemotherapy,Rheumatoid arthritis, gastroparesis, HLD, fatty liver, ?  ?Presented with   diverticulitis, her back was hurting her abdomen was hurting ?Patient has history of breast cancer recently started on chemotherapy reports dysuria and burning with urination.  Denies any fever patient was endorsing constipation has been using tramadol for pain had a lot of nausea and malaise recent COVID infection but symptoms have improved since then no ongoing shortness of breath ?  ?In emergency department and at [facility] patient was diagnosed with UTI and diverticulitis she was started on cefepime and Flagyl was ready to be admitted was also found to be neutropenic ?Patient suddenly developed fever up to 100.3 was noted to be having rigors tachycardic shaking her antibiotic coverage was broadened and she was changed to progressive care ? She reports she had an allergic reaction to Flagyl ?She reports she was on a Cruise started to have symptoms  ?On 17th of April ?Denies any cough or runny nose ?She is feeling better now ? ? ?Subjective: ?Tmax overnight 38.1 ?C, A/O x4, negative nausea, negative vomiting, positive large BM, states feels improved. ? ? ?Assessment & Plan: ?Covid vaccination; ?  ?Principal Problem: ?  Neutropenia ([place]) ?Active Problems: ?  Hyperlipidemia ?  Asthma ?  GERD (gastroesophageal reflux disease) ?  Breast cancer of upper-inner quadrant of right female breast ([place]) ?  UTI (urinary tract infection) ?  Sepsis ([place]) ?  COVID-19 virus infection ?  Diverticulitis ? ? ?Sepsis (HCC)/Neutropenic Fever ?-Continue broad-spectrum antibiotics ?- Oncology on board Dr. Marin Olp ?-Per 5/15 Dr. Marin Olp note patient received Neulasta after her last chemo treatment ?-Will trend WBCs and if they do not begin to trend up will  discuss with Dr. Marin Olp about giving patient another dose of Neulasta ? ?Positive COVID-19 virus infection ?Patient reports recent infection 1 m ago currently feeling better ?Positive test but currently symptoms more consistent with diverticulitis UTI rather than COVID ?Test done at [facility] at this point unable to obtain cyclic threshold ?Check chest x-ray ?No respiratory complaints doubt active infection. ?Possibly residual trace COVID particles in the setting of neutropenia and difficulty in clearance ?COVID-19 Labs ? ?Recent Labs  ?  08/02/21 ?0823  ?DDIMER 0.58*  ?FERRITIN 338*  ?LDH 133  ?CRP 15.2*  ? ? ?Lab Results  ?Component Value Date  ? SARSCOV2NAA POSITIVE (A) 08/01/2021  ? [place] Not Detected 04/16/2020  ? -5/15 per oncology recommendation we will repeat COVID test.  ADDENDUM negative ?-5/15 Echocardiogram pending ?- 5/15 normal saline 119m/hr ?  ?Diverticulitis ?-5/15 continue current antibiotics for 7 to 10 days.   ? ? ?Hyperlipidemia ?-Chronic stable continue home medications ?  ?Asthma ?-Chronic continue home meds ?  ?GERD (gastroesophageal reflux disease) ?-Continue PPI ?  ?Breast cancer of upper-inner quadrant of right female breast ([place] ?-Followed by Dr. EMarin Olphas been recently receiving chemotherapy ?Oncology will consult on her in the morning during her admission. ?  ?UTI (urinary tract infection) ?-Covered by current antibiotics. ?- Urine culture pending ? ?Obesity (BMI 33.11 kg/m?). ? ?  ? ? ?  ? ? ? ?Mobility Assessment (last 72 hours)   ? ? Mobility Assessment   ? ? [place] 08/01/21 21:16:23  ?  ?  ?  ?  ? Does patient have an order for  bedrest or is patient medically unstable No - Continue assessment      ? What is the highest level of mobility based on the progressive mobility assessment? Level 5 (Walks with assist in room/hall) - Balance while stepping forward/back and can walk in room with assist - Complete      ? ?  ?  ? ?  ? ? ? ? ?Interdisciplinary Goals of  Care Family Meeting ? ? ?Date carried out: 08/02/2021 ? ?Location of the meeting:  ? ?Member's involved:  ? ?Durable Power of Tour manager:    ? ?Discussion: We discussed goals of care for Christie Thomas .   ? ?Code status:  ? ?Disposition:  ? ?Time spent for the meeting:  ? ? ? ?Donella Pascarella, Geraldo Docker, MD ? ?08/02/2021, 7:46 AM ? ? ?  ? ?   ?DVT prophylaxis: Lovenox ?Code Status: Full ?Family Communication: 5/15 husband at bedside for discussion of plan of care all questions answered ?Status is: Inpatient ? ? ? ?Dispo: The patient is from: Home ?             Anticipated d/c is to: Home ?             Anticipated d/c date is: > 3 days ?             Patient currently is not medically stable to d/c. ? ? ? ? ? ?Consultants:  ?Dr. Marin Olp oncology ? ? ?Procedures/Significant Events:  ? ? ?I have personally reviewed and interpreted all radiology studies and my findings are as above. ? ?VENTILATOR SETTINGS: ? ? ? ? ?Cultures ?5/14 influenza A/B negative ?5/14 positive SARS coronavirus ?5/14 blood LEFT wrist NGTD ?5/14 urine pending ?5/15 repeat SARS coronavirus negative ?5/15 GI panel pending ?5/15 respiratory virus panel ? ? ? ?Antimicrobials: ?Anti-infectives (From admission, onward)  ? ? Start     Ordered Stop  ? 08/02/21 2100  vancomycin (VANCOREADY) IVPB 1250 mg/250 mL       ? 08/01/21 1957    ? 08/02/21 0700  metroNIDAZOLE (FLAGYL) IVPB 500 mg       ? 08/01/21 2359    ? 08/02/21 0600  metroNIDAZOLE (FLAGYL) IVPB 500 mg  Status:  Discontinued       ? 08/01/21 2157 08/01/21 2223  ? 08/02/21 0400  ceFEPIme (MAXIPIME) 2 g in sodium chloride 0.9 % 100 mL IVPB       ? 08/01/21 2208    ? 08/01/21 2245  ceFEPIme (MAXIPIME) 2 g in sodium chloride 0.9 % 100 mL IVPB  Status:  Discontinued       ? 08/01/21 2157 08/01/21 2206  ? 08/01/21 2000  vancomycin (VANCOREADY) IVPB 1750 mg/350 mL       ? 08/01/21 1957 08/01/21 2338  ? 08/01/21 1730  ceFEPIme (MAXIPIME) 2 g in sodium chloride 0.9 % 100 mL IVPB        ? 08/01/21 1720 08/01/21 1810  ? 08/01/21 1730  metroNIDAZOLE (FLAGYL) IVPB 500 mg       ? 08/01/21 1720 08/01/21 1854  ? ?  ?  ? ? ?Devices ?  ? ?LINES / TUBES:  ? ? ? ? ?Continuous Infusions: ? sodium chloride Stopped (08/01/21 1740)  ? sodium chloride 75 mL/hr (08/02/21 0344)  ? ceFEPime (MAXIPIME) IV 2 g (08/02/21 0336)  ? metronidazole 500 mg (08/02/21 0615)  ? vancomycin    ? ? ? ?Objective: ?Vitals:  ? 08/02/21 0300 08/02/21 0400 08/02/21 0500  08/02/21 0600  ?BP: 123/67 115/61 109/63 115/76  ?Pulse: 89 81 81 90  ?Resp: '16  17 19  '$ ?Temp:  98.1 ?F (36.7 ?C)  97.7 ?F (36.5 ?C)  ?TempSrc:  Oral    ?SpO2: 96% 94% 94% 97%  ?Weight:      ?Height:      ? ? ?Intake/Output Summary (Last 24 hours) at 08/02/2021 0746 ?Last data filed at 08/02/2021 0615 ?Gross per 24 hour  ?Intake 1114.73 ml  ?Output 800 ml  ?Net 314.73 ml  ? ?Filed Weights  ? 08/01/21 2015  ?Weight: 87.5 kg  ? ? ?Examination: ? ?General: A/O x4 No acute respiratory distress ?Eyes: negative scleral hemorrhage, negative anisocoria, negative icterus ?ENT: Negative Runny nose, negative gingival bleeding, ?Neck:  Negative scars, masses, torticollis, lymphadenopathy, JVD ?Lungs: Clear to auscultation bilaterally without wheezes or crackles, LEFT chest wall port currently accessed, negative sign of infection ?Cardiovascular: Regular rate and rhythm without murmur gallop or rub normal S1 and S2 ?Abdomen: negative abdominal pain, nondistended, positive soft, bowel sounds, no rebound, no ascites, no appreciable mass ?Extremities: No significant cyanosis, clubbing, or edema bilateral lower extremities ?Skin: Negative rashes, lesions, ulcers ?Psychiatric:  Negative depression, negative anxiety, negative fatigue, negative mania  ?Central nervous system:  Cranial nerves II through XII intact, tongue/uvula midline, all extremities muscle strength 5/5, sensation intact throughout, negative dysarthria, negative expressive aphasia, negative receptive aphasia. ? ?.   ? ? ? ?Data Reviewed: Care during the described time interval was provided by me .  I have reviewed this patient's available data, including medical history, events of note, physical examination, and all tes

## 2021-08-03 ENCOUNTER — Inpatient Hospital Stay (HOSPITAL_COMMUNITY): Payer: BC Managed Care – PPO

## 2021-08-03 DIAGNOSIS — Z862 Personal history of diseases of the blood and blood-forming organs and certain disorders involving the immune mechanism: Secondary | ICD-10-CM

## 2021-08-03 DIAGNOSIS — B3781 Candidal esophagitis: Secondary | ICD-10-CM | POA: Diagnosis present

## 2021-08-03 DIAGNOSIS — K5792 Diverticulitis of intestine, part unspecified, without perforation or abscess without bleeding: Secondary | ICD-10-CM | POA: Diagnosis not present

## 2021-08-03 DIAGNOSIS — B370 Candidal stomatitis: Secondary | ICD-10-CM | POA: Diagnosis present

## 2021-08-03 DIAGNOSIS — T451X5A Adverse effect of antineoplastic and immunosuppressive drugs, initial encounter: Secondary | ICD-10-CM | POA: Diagnosis not present

## 2021-08-03 DIAGNOSIS — J4520 Mild intermittent asthma, uncomplicated: Secondary | ICD-10-CM | POA: Diagnosis not present

## 2021-08-03 DIAGNOSIS — D701 Agranulocytosis secondary to cancer chemotherapy: Secondary | ICD-10-CM | POA: Diagnosis not present

## 2021-08-03 DIAGNOSIS — C50911 Malignant neoplasm of unspecified site of right female breast: Secondary | ICD-10-CM | POA: Diagnosis not present

## 2021-08-03 DIAGNOSIS — C50211 Malignant neoplasm of upper-inner quadrant of right female breast: Secondary | ICD-10-CM | POA: Diagnosis not present

## 2021-08-03 LAB — CBC WITH DIFFERENTIAL/PLATELET
Abs Immature Granulocytes: 0.3 10*3/uL — ABNORMAL HIGH (ref 0.00–0.07)
Basophils Absolute: 0 10*3/uL (ref 0.0–0.1)
Basophils Relative: 0 %
Eosinophils Absolute: 0 10*3/uL (ref 0.0–0.5)
Eosinophils Relative: 1 %
HCT: 37.6 % (ref 36.0–46.0)
Hemoglobin: 12.7 g/dL (ref 12.0–15.0)
Lymphocytes Relative: 44 %
Lymphs Abs: 1.5 10*3/uL (ref 0.7–4.0)
MCH: 30.9 pg (ref 26.0–34.0)
MCHC: 33.8 g/dL (ref 30.0–36.0)
MCV: 91.5 fL (ref 80.0–100.0)
Metamyelocytes Relative: 6 %
Monocytes Absolute: 0.2 10*3/uL (ref 0.1–1.0)
Monocytes Relative: 6 %
Myelocytes: 4 %
Neutro Abs: 1.3 10*3/uL — ABNORMAL LOW (ref 1.7–7.7)
Neutrophils Relative %: 39 %
Platelets: 106 10*3/uL — ABNORMAL LOW (ref 150–400)
RBC: 4.11 MIL/uL (ref 3.87–5.11)
RDW: 13 % (ref 11.5–15.5)
WBC: 3.4 10*3/uL — ABNORMAL LOW (ref 4.0–10.5)
nRBC: 0 % (ref 0.0–0.2)

## 2021-08-03 LAB — COMPREHENSIVE METABOLIC PANEL
ALT: 33 U/L (ref 0–44)
AST: 34 U/L (ref 15–41)
Albumin: 2.6 g/dL — ABNORMAL LOW (ref 3.5–5.0)
Alkaline Phosphatase: 50 U/L (ref 38–126)
Anion gap: 8 (ref 5–15)
BUN: 30 mg/dL — ABNORMAL HIGH (ref 8–23)
CO2: 20 mmol/L — ABNORMAL LOW (ref 22–32)
Calcium: 7.4 mg/dL — ABNORMAL LOW (ref 8.9–10.3)
Chloride: 110 mmol/L (ref 98–111)
Creatinine, Ser: 0.78 mg/dL (ref 0.44–1.00)
GFR, Estimated: >60 mL/min (ref >60)
Glucose, Bld: 127 mg/dL — ABNORMAL HIGH (ref 70–99)
Potassium: 3.6 mmol/L (ref 3.5–5.1)
Sodium: 138 mmol/L (ref 135–145)
Total Bilirubin: 2.1 mg/dL — ABNORMAL HIGH (ref 0.3–1.2)
Total Protein: 5.5 g/dL — ABNORMAL LOW (ref 6.5–8.1)

## 2021-08-03 LAB — GASTROINTESTINAL PANEL BY PCR, STOOL (REPLACES STOOL CULTURE)

## 2021-08-03 LAB — URINE CULTURE: Culture: <10000 — AB

## 2021-08-03 LAB — PHOSPHORUS: Phosphorus: 1.3 mg/dL — ABNORMAL LOW (ref 2.5–4.6)

## 2021-08-03 LAB — MAGNESIUM: Magnesium: 2.7 mg/dL — ABNORMAL HIGH (ref 1.7–2.4)

## 2021-08-03 MED ORDER — FLUCONAZOLE IN SODIUM CHLORIDE 200-0.9 MG/100ML-% IV SOLN
200.0000 mg | Freq: Once | INTRAVENOUS | Status: AC
  Administered 2021-08-03: 200 mg via INTRAVENOUS
  Filled 2021-08-03: qty 100

## 2021-08-03 MED ORDER — FLUCONAZOLE 100 MG PO TABS
100.0000 mg | ORAL_TABLET | Freq: Every day | ORAL | Status: DC
  Administered 2021-08-03: 100 mg via ORAL
  Filled 2021-08-03: qty 1

## 2021-08-03 MED ORDER — ONDANSETRON HCL 4 MG/2ML IJ SOLN
4.0000 mg | Freq: Four times a day (QID) | INTRAMUSCULAR | Status: DC | PRN
  Administered 2021-08-03 – 2021-08-06 (×7): 4 mg via INTRAVENOUS
  Filled 2021-08-03 (×7): qty 2

## 2021-08-03 MED ORDER — DEXTROSE 5 % IV SOLN
30.0000 mmol | Freq: Once | INTRAVENOUS | Status: AC
  Administered 2021-08-03: 30 mmol via INTRAVENOUS
  Filled 2021-08-03: qty 10

## 2021-08-03 MED ORDER — CALCIUM GLUCONATE-NACL 2-0.675 GM/100ML-% IV SOLN
2.0000 g | Freq: Once | INTRAVENOUS | Status: AC
  Administered 2021-08-03: 2000 mg via INTRAVENOUS
  Filled 2021-08-03: qty 100

## 2021-08-03 MED ORDER — FLUCONAZOLE 100MG IVPB
100.0000 mg | INTRAVENOUS | Status: DC
  Administered 2021-08-04: 100 mg via INTRAVENOUS
  Filled 2021-08-03 (×3): qty 50

## 2021-08-03 MED ORDER — FLUCONAZOLE 100 MG PO TABS: 100.0000 mg | ORAL_TABLET | Freq: Every day | ORAL | Status: DC

## 2021-08-03 NOTE — Progress Notes (Signed)
Ms. Marseille is doing a whole lot better.  She was sitting up in bed.  She feels better because she finally went to the bathroom.  She has gone many times.  She feels so much better. ? ?I think she also feels better because her white cell count is up to 3.4 now.  This to come up quite quickly.  I am very happy that this is recovered well.  She is not neutropenic. ? ?Not surprisingly, the repeat COVID test is negative. ? ?Cultures are not yet back.  I do believe that her cultures will be negative. ? ?Her electrolytes show BUN of 30 creatinine 0.78.  Her calcium is 7.4 with an albumin of 2.6. ? ?White count 3.4.  Hemoglobin 12.7.  Platelet count 106,000. ? ?She is on some ice chips right now.  Hopefully, she will be advanced to clear liquids. ? ?Her vital signs show temperature 99.5.  Pulse 110.  Blood pressure 140/64.  Her lungs are clear.  Cardiac exam is tachycardic but regular.  Oral exam shows some slight thrush.  Her abdomen is soft.  Bowel sounds are present.  There is not any obvious tenderness.  Extremities shows no clubbing, cyanosis or edema.  Neurological exam is nonfocal. ? ?Again, her white cell count is recovered very nicely.  Hopefully, antibiotics can be pulled back if her cultures are negative.  She is going to the bathroom. ? ?It will be nice to move her to a full liquids. ? ?Hopefully, she will be able to go home soon.  Again her white cell count has recovered.  She is going to the bathroom.  She just looks great.  We had to see what her urine culture shows. ? ?I appreciate the great care from the staff on 4 E. ? ?Lattie Haw, MD ? ?Psalm 119:105 ?

## 2021-08-03 NOTE — Progress Notes (Signed)
Patient refused 2D Echocardiogram. ? ? ?Christie Thomas  [name] ?08/03/2021, 1:11 PM ?

## 2021-08-03 NOTE — Progress Notes (Signed)
?PROGRESS NOTE ? ? ? ?Christie Thomas  JOA:[id number] DOB: [birth year] DOA: 08/01/2021 ?PCP: Midge Minium, MD  ? ? ? ?Brief Narrative:  ?66 y.o. WF PMHx  breast cancer recent chemotherapy,Rheumatoid arthritis, gastroparesis, HLD, fatty liver, ?  ?Presented with   diverticulitis, her back was hurting her abdomen was hurting ?Patient has history of breast cancer recently started on chemotherapy reports dysuria and burning with urination.  Denies any fever patient was endorsing constipation has been using tramadol for pain had a lot of nausea and malaise recent COVID infection but symptoms have improved since then no ongoing shortness of breath ?  ?In emergency department and at [facility] patient was diagnosed with UTI and diverticulitis she was started on cefepime and Flagyl was ready to be admitted was also found to be neutropenic ?Patient suddenly developed fever up to 100.3 was noted to be having rigors tachycardic shaking her antibiotic coverage was broadened and she was changed to progressive care ? She reports she had an allergic reaction to Flagyl ?She reports she was on a Cruise started to have symptoms  ?On 17th of April ?Denies any cough or runny nose ?She is feeling better now ? ? ?Subjective: ?5/16 afebrile overnight, A/O x4 positive nausea,  positivediarrhea. Positive fatigue ? ? ?Assessment & Plan: ?Covid vaccination; ?  ?Principal Problem: ?  Neutropenia ([place]) ?Active Problems: ?  Hyperlipidemia ?  Asthma ?  GERD (gastroesophageal reflux disease) ?  Breast cancer of upper-inner quadrant of right female breast ([place]) ?  UTI (urinary tract infection) ?  Sepsis ([place]) ?  COVID-19 virus infection ?  Diverticulitis ?  Esophageal thrush ([place]) ?  Oral thrush ? ? ?Sepsis (HCC)/Neutropenic Fever ?-Continue broad-spectrum antibiotics ?- Oncology on board Dr. Marin Olp ?-Per 5/15 Dr. Marin Olp note patient received Neulasta after her last chemo treatment ?-Will trend WBCs and if they do not begin  to trend up will discuss with Dr. Marin Olp about giving patient another dose of Neulasta ?-5/16 neutropenia resolved. ?-5/16 narrow antibiotics to cover diverticulitis when blood cultures finalized ? ?Positive COVID-19 virus infection ?Patient reports recent infection 1 m ago currently feeling better ?Positive test but currently symptoms more consistent with diverticulitis UTI rather than COVID ?Test done at [facility] at this point unable to obtain cyclic threshold ?Check chest x-ray ?No respiratory complaints doubt active infection. ?Possibly residual trace COVID particles in the setting of neutropenia and difficulty in clearance ?COVID-19 Labs ? ?Recent Labs  ?  08/02/21 ?0823  ?DDIMER 0.58*  ?FERRITIN 338*  ?LDH 133  ?CRP 15.2*  ? ? ?Lab Results  ?Component Value Date  ? [place] NEGATIVE 08/02/2021  ? SARSCOV2NAA POSITIVE (A) 08/01/2021  ? [place] Not Detected 04/16/2020  ? -5/15 per oncology recommendation we will repeat COVID test.  ADDENDUM negative ?-5/15 Echocardiogram pending ?- 5/15 normal saline 142m/hr ?-5/16 change D5-0.9% saline 762mhr (patient unable to take oral nutrition/fluids) ?  ?Acute Diverticulitis ?-Patient is high risk patient secondary to being immunocompromised. ?-5/15 complete course appropriate antibiotics for 10 to 14 days ?-5/15 stool culture negative ? ?Thrush oral/Esophageal thrush  ?- 5/16 Fluconazole IV 200 mg x 1---> Fluconazole IV 100 mg x 6 doses ?-5/16 patient with significant thrush unable to tolerate p.o. medication. ? ?Hyperlipidemia ?-Chronic stable continue home medications ?  ?Asthma ?-Chronic continue home meds ?  ?GERD (gastroesophageal reflux disease) ?-Continue PPI ?  ?Breast cancer of upper-inner quadrant of right female breast ([place]?-Followed by Dr. EnMarin Olpas been recently receiving chemotherapy ?  Oncology will consult on her in the morning during her admission. ?- ?  ?UTI (urinary tract infection) ?-Covered by current antibiotics. ?- Urine  culture positive insignificant growth ? ?Hypophosphatemia ?- Phosphorus goal> 2.5 ?- 5/16 K-Phos 30 mmol ? ?Hypocalcemia ?- Goal calcium> 8.9 ?-5/16 corrected calcium= 8.5 ?- 5/16 calcium gluconate 2 g ? ?Obesity (BMI 33.11 kg/m?). ? ?  ? ? ?  ? ? ? ?Mobility Assessment (last 72 hours)   ? ? Mobility Assessment   ? ? [place] Name 08/03/21 0300 08/01/21 21:16:23  ?  ?  ?  ? Does patient have an order for bedrest or is patient medically unstable No - Continue assessment No - Continue assessment     ? What is the highest level of mobility based on the progressive mobility assessment? Level 5 (Walks with assist in room/hall) - Balance while stepping forward/back and can walk in room with assist - Complete Level 5 (Walks with assist in room/hall) - Balance while stepping forward/back and can walk in room with assist - Complete     ? ?  ?  ? ?  ? ? ? ? ?  ? ?   ?DVT prophylaxis: Lovenox ?Code Status: Full ?Family Communication: 5/16 spoke to husband over phone discussed of plan of care all questions answered ?Status is: Inpatient ? ? ? ?Dispo: The patient is from: Home ?             Anticipated d/c is to: Home ?             Anticipated d/c date is: > 3 days ?             Patient currently is not medically stable to d/c. ? ? ? ? ? ?Consultants:  ?Dr. Marin Olp oncology ? ? ?Procedures/Significant Events:  ?5/14 CT Abdomen pelvis W0 contrast ?- Descending and sigmoid diverticulosis.  ?-Fat stranding and inflammatory fluid about the diverticular proximal sigmoid, ?consistent with acute diverticulitis. No evidence of perforation or ?abscess. ?-Large burden of stool in the colon proximal to the rectosigmoid ?junction. ?-No noncontrast evidence of lymphadenopathy or metastatic disease in the abdomen or pelvis. ? ? ?I have personally reviewed and interpreted all radiology studies and my findings are as above. ? ?VENTILATOR SETTINGS: ? ? ? ? ?Cultures ?5/14 influenza A/B negative ?5/14 positive SARS coronavirus ?5/14 blood LEFT wrist  NGTD ?5/14 urine positive insignificant growth ?5/15 repeat SARS coronavirus negative ?5/15 GI panel negative ?5/15 respiratory virus panel ? ? ? ?Antimicrobials: ?Anti-infectives (From admission, onward)  ? ? Start     Ordered Stop  ? 08/02/21 2100  vancomycin (VANCOREADY) IVPB 1250 mg/250 mL       ? 08/01/21 1957    ? 08/02/21 0700  metroNIDAZOLE (FLAGYL) IVPB 500 mg       ? 08/01/21 2359    ? 08/02/21 0600  metroNIDAZOLE (FLAGYL) IVPB 500 mg  Status:  Discontinued       ? 08/01/21 2157 08/01/21 2223  ? 08/02/21 0400  ceFEPIme (MAXIPIME) 2 g in sodium chloride 0.9 % 100 mL IVPB       ? 08/01/21 2208    ? 08/01/21 2245  ceFEPIme (MAXIPIME) 2 g in sodium chloride 0.9 % 100 mL IVPB  Status:  Discontinued       ? 08/01/21 2157 08/01/21 2206  ? 08/01/21 2000  vancomycin (VANCOREADY) IVPB 1750 mg/350 mL       ? 08/01/21 1957 08/01/21 2338  ? 08/01/21 1730  ceFEPIme (MAXIPIME) 2  g in sodium chloride 0.9 % 100 mL IVPB       ? 08/01/21 1720 08/01/21 1810  ? 08/01/21 1730  metroNIDAZOLE (FLAGYL) IVPB 500 mg       ? 08/01/21 1720 08/01/21 1854  ? ?  ?  ? ? ?Devices ?  ? ?LINES / TUBES:  ? ? ? ? ?Continuous Infusions: ? sodium chloride Stopped (08/01/21 1740)  ? sodium chloride 75 mL/hr at 08/03/21 0846  ? ceFEPime (MAXIPIME) IV 2 g (08/03/21 1424)  ? [START ON 08/04/2021] fluconazole (DIFLUCAN) IV    ? metronidazole 100 mL/hr at 08/03/21 0640  ? ? ? ?Objective: ?Vitals:  ? 08/02/21 1427 08/02/21 1500 08/02/21 2029 08/03/21 0448  ?BP:   127/65 140/64  ?Pulse: (!) 107  (!) 108 (!) 110  ?Resp: '20  19 18  '$ ?Temp:  98.7 ?F (37.1 ?C) 98.6 ?F (37 ?C) 99.5 ?F (37.5 ?C)  ?TempSrc:   Oral Oral  ?SpO2:   94% 93%  ?Weight:      ?Height:      ? ? ?Intake/Output Summary (Last 24 hours) at 08/03/2021 1937 ?Last data filed at 08/03/2021 1500 ?Gross per 24 hour  ?Intake 3966.62 ml  ?Output --  ?Net 3966.62 ml  ? ?Filed Weights  ? 08/01/21 2015  ?Weight: 87.5 kg  ? ? ?Examination: ? ?General: A/O x4 No acute respiratory distress ?Eyes: negative  scleral hemorrhage, negative anisocoria, negative icterus ?ENT: Thrush present. ?Neck:  Negative scars, masses, torticollis, lymphadenopathy, JVD ?Lungs: Clear to auscultation bilaterally without wheezes o

## 2021-08-04 ENCOUNTER — Inpatient Hospital Stay (HOSPITAL_COMMUNITY): Payer: BC Managed Care – PPO

## 2021-08-04 DIAGNOSIS — C50911 Malignant neoplasm of unspecified site of right female breast: Secondary | ICD-10-CM | POA: Diagnosis not present

## 2021-08-04 DIAGNOSIS — Z862 Personal history of diseases of the blood and blood-forming organs and certain disorders involving the immune mechanism: Secondary | ICD-10-CM | POA: Diagnosis not present

## 2021-08-04 DIAGNOSIS — I509 Heart failure, unspecified: Secondary | ICD-10-CM | POA: Diagnosis not present

## 2021-08-04 DIAGNOSIS — K5792 Diverticulitis of intestine, part unspecified, without perforation or abscess without bleeding: Secondary | ICD-10-CM | POA: Diagnosis not present

## 2021-08-04 DIAGNOSIS — T451X5A Adverse effect of antineoplastic and immunosuppressive drugs, initial encounter: Secondary | ICD-10-CM | POA: Diagnosis not present

## 2021-08-04 DIAGNOSIS — C50211 Malignant neoplasm of upper-inner quadrant of right female breast: Secondary | ICD-10-CM | POA: Diagnosis not present

## 2021-08-04 DIAGNOSIS — B3781 Candidal esophagitis: Secondary | ICD-10-CM | POA: Diagnosis not present

## 2021-08-04 DIAGNOSIS — U071 COVID-19: Secondary | ICD-10-CM | POA: Diagnosis not present

## 2021-08-04 DIAGNOSIS — R202 Paresthesia of skin: Secondary | ICD-10-CM

## 2021-08-04 DIAGNOSIS — D701 Agranulocytosis secondary to cancer chemotherapy: Secondary | ICD-10-CM | POA: Diagnosis not present

## 2021-08-04 LAB — C DIFFICILE (CDIFF) QUICK SCRN (NO PCR REFLEX)
C Diff antigen: NEGATIVE
C Diff interpretation: NOT DETECTED
C Diff toxin: NEGATIVE

## 2021-08-04 LAB — ECHOCARDIOGRAM COMPLETE
AR max vel: 2.65 cm2
AV Area VTI: 2.79 cm2
AV Area mean vel: 2.45 cm2
AV Mean grad: 4 mmHg
AV Peak grad: 7.1 mmHg
Ao pk vel: 1.33 m/s
Area-P 1/2: 4.21 cm2
Calc EF: 70.5 %
Height: 64 in
S' Lateral: 2.1 cm
Single Plane A2C EF: 71.9 %
Single Plane A4C EF: 68 %
Weight: 3086.44 oz

## 2021-08-04 LAB — PHOSPHORUS: Phosphorus: 1.9 mg/dL — ABNORMAL LOW (ref 2.5–4.6)

## 2021-08-04 LAB — COMPREHENSIVE METABOLIC PANEL
ALT: 31 U/L (ref 0–44)
AST: 42 U/L — ABNORMAL HIGH (ref 15–41)
Albumin: 2.7 g/dL — ABNORMAL LOW (ref 3.5–5.0)
Alkaline Phosphatase: 81 U/L (ref 38–126)
Anion gap: 7 (ref 5–15)
BUN: 20 mg/dL (ref 8–23)
CO2: 22 mmol/L (ref 22–32)
Calcium: 8.1 mg/dL — ABNORMAL LOW (ref 8.9–10.3)
Chloride: 110 mmol/L (ref 98–111)
Creatinine, Ser: 0.67 mg/dL (ref 0.44–1.00)
GFR, Estimated: >60 mL/min (ref >60)
Glucose, Bld: 96 mg/dL (ref 70–99)
Potassium: 3.6 mmol/L (ref 3.5–5.1)
Sodium: 139 mmol/L (ref 135–145)
Total Bilirubin: 1.2 mg/dL (ref 0.3–1.2)
Total Protein: 5.8 g/dL — ABNORMAL LOW (ref 6.5–8.1)

## 2021-08-04 LAB — CBC WITH DIFFERENTIAL/PLATELET
Abs Immature Granulocytes: 1.4 10*3/uL — ABNORMAL HIGH (ref 0.00–0.07)
Band Neutrophils: 1 %
Basophils Absolute: 0.2 10*3/uL — ABNORMAL HIGH (ref 0.0–0.1)
Basophils Relative: 1 %
Eosinophils Absolute: 0 10*3/uL (ref 0.0–0.5)
Eosinophils Relative: 0 %
HCT: 39.2 % (ref 36.0–46.0)
Hemoglobin: 12.9 g/dL (ref 12.0–15.0)
Lymphocytes Relative: 18 %
Lymphs Abs: 3.2 10*3/uL (ref 0.7–4.0)
MCH: 30 pg (ref 26.0–34.0)
MCHC: 32.9 g/dL (ref 30.0–36.0)
MCV: 91.2 fL (ref 80.0–100.0)
Metamyelocytes Relative: 5 %
Monocytes Absolute: 0.5 10*3/uL (ref 0.1–1.0)
Monocytes Relative: 3 %
Myelocytes: 3 %
Neutro Abs: 12.5 10*3/uL — ABNORMAL HIGH (ref 1.7–7.7)
Neutrophils Relative %: 69 %
Platelets: 137 10*3/uL — ABNORMAL LOW (ref 150–400)
RBC: 4.3 MIL/uL (ref 3.87–5.11)
RDW: 12.9 % (ref 11.5–15.5)
WBC: 17.8 10*3/uL — ABNORMAL HIGH (ref 4.0–10.5)
nRBC: 0.6 % — ABNORMAL HIGH (ref 0.0–0.2)

## 2021-08-04 LAB — MAGNESIUM: Magnesium: 2.5 mg/dL — ABNORMAL HIGH (ref 1.7–2.4)

## 2021-08-04 MED ORDER — SODIUM CHLORIDE 0.9 % IV SOLN
1.0000 g | Freq: Three times a day (TID) | INTRAVENOUS | Status: DC
  Administered 2021-08-04 – 2021-08-07 (×10): 1 g via INTRAVENOUS
  Filled 2021-08-04 (×3): qty 20
  Filled 2021-08-04: qty 1
  Filled 2021-08-04 (×6): qty 20

## 2021-08-04 MED ORDER — SODIUM CHLORIDE 0.9 % IV SOLN
40.0000 mg | Freq: Two times a day (BID) | INTRAVENOUS | Status: DC
  Administered 2021-08-04 – 2021-08-05 (×4): 40 mg via INTRAVENOUS
  Filled 2021-08-04 (×5): qty 4

## 2021-08-04 MED ORDER — LOPERAMIDE HCL 2 MG PO CAPS: 2.0000 mg | ORAL_CAPSULE | Freq: Three times a day (TID) | ORAL | Status: DC | PRN

## 2021-08-04 MED ORDER — POTASSIUM PHOSPHATES 15 MMOLE/5ML IV SOLN
15.0000 mmol | Freq: Once | INTRAVENOUS | Status: AC
  Administered 2021-08-04: 15 mmol via INTRAVENOUS
  Filled 2021-08-04: qty 5

## 2021-08-04 NOTE — Progress Notes (Signed)
It is hard to figure out what happened last night.  I will know if she had any count of reaction to the antibiotics.  She says she has some swelling and some numbness around the lips.  This happened with the Maxipime according to her nurse.  She appeared to also be reactive to Flagyl.  Patient still going to the bathroom a lot.  She says she is having "green diarrhea." ? ?Her cultures are negative to date. ? ?She is having no temperature. ? ?White cell count clearly has come back.  Her white cell count is 17.8.  Hemoglobin 12.9.  Platelet count 137,000. ? ?Her BUN is 20 creatinine 0.67.  Calcium 8.1 with an albumin of 2.7.  Magnesium 2.5 with an a phosphorus of 1.9. ? ?She is really not eating much.  I think she is having some liquids right now. ? ?She has had no rash. ? ?Her vital signs are all stable.  Temperature 98.7.  Blood pressure 127/64.  Pulse is 92.  Her lungs are clear.  Cardiac exam regular rate and rhythm.  Abdomen is soft.  Bowel sounds are present.  There is little bit of tenderness in the lower abdomen. ? ?She has acute diverticulitis.  She is going to the bathroom quite a bit now. ? ?I appreciate the great care she is getting from all the staff on 4 E. ? ?Lattie Haw, MD ? ?James 1:5 ? ? ?

## 2021-08-04 NOTE — Progress Notes (Signed)
?PROGRESS NOTE ? ? ? ?Christie Thomas  VVZ:[id number] DOB: 08/06/1955 DOA: 08/01/2021 ?PCP: Midge Minium, MD  ? ?Brief Narrative:  ?66 year old female with history of breast cancer treated with chemotherapy, rheumatoid arthritis, gastroparesis, hyperlipidemia, fatty liver presented with worsening abdominal pain.  She was diagnosed with UTI and acute diverticulitis and started on broad-spectrum antibiotics.  She was also found to be neutropenic.  Oncology was consulted. ? ?Assessment & Plan: ?  ?Sepsis: Present on admission ?Neutropenic fever ?Acute diverticulitis ?Possible UTI ?-Patient was on cefepime and Flagyl but had acute lip swelling/numbness with chest pain/epigastric pain on 08/03/2021 and subsequently cefepime was held.  We will switch to meropenem.  DC Flagyl. ?-Currently having significant diarrhea.  Stool for GI PCR negative; will check stool for C. difficile. ?-Currently afebrile and hemodynamically stable. ?-Blood cultures negative so far.  Urine culture had insignificant growth ?-Continue IV fluids.  Oral intake not that good yet. ? ?Leukocytosis ?-Neutropenia has resolved.  WBC 17.8 today.  Monitor. ? ?Hypophosphatemia ?-replace ? ?Hyponatremia ?-Resolved ? ?Hypocalcemia ?-Improving ? ?Thrombocytopenia ?-Questionable cause.  Monitor.  No signs of bleeding ? ?COVID-19 infection ?-Apparently had COVID-19 infection a month ago.  COVID testing done on presentation was initially positive but repeat was negative.  No further isolation needed. ? ?Possible oral/esophageal thrush ?-Continue IV Diflucan. ? ?Hyperlipidemia ?-Continue statin and fenofibrate ? ?Breast cancer undergoing chemotherapy ?-Oncology following. ? ?Obesity ?-Outpatient follow-up ? ? ?DVT prophylaxis: Lovenox ?Code Status: Full ?Family Communication: Husband at bedside ?Disposition Plan: ?Status is: Inpatient ?Remains inpatient appropriate because: Of severity of illness ? ? ? ?Consultants: Oncology ? ?Procedures:  None ? ?Antimicrobials:  ?Anti-infectives (From admission, onward)  ? ? Start     Dose/Rate Route Frequency Ordered Stop  ? 08/04/21 1000  fluconazole (DIFLUCAN) tablet 100 mg  Status:  Discontinued       ? 100 mg Oral Daily 08/03/21 0859 08/03/21 1532  ? 08/04/21 1000  fluconazole (DIFLUCAN) IVPB 100 mg       ? 100 mg ?50 mL/hr over 60 Minutes Intravenous Every 24 hours 08/03/21 1532    ? 08/04/21 1000  meropenem (MERREM) 1 g in sodium chloride 0.9 % 100 mL IVPB       ? 1 g ?200 mL/hr over 30 Minutes Intravenous Every 8 hours 08/04/21 0931    ? 08/03/21 1000  fluconazole (DIFLUCAN) tablet 100 mg  Status:  Discontinued       ? 100 mg Oral Daily 08/03/21 0719 08/03/21 0859  ? 08/03/21 1000  fluconazole (DIFLUCAN) IVPB 200 mg       ? 200 mg ?100 mL/hr over 60 Minutes Intravenous  Once 08/03/21 0859 08/03/21 1300  ? 08/02/21 2100  vancomycin (VANCOREADY) IVPB 1250 mg/250 mL  Status:  Discontinued       ? 1,250 mg ?166.7 mL/hr over 90 Minutes Intravenous Every 24 hours 08/01/21 1957 08/03/21 0719  ? 08/02/21 0700  metroNIDAZOLE (FLAGYL) IVPB 500 mg  Status:  Discontinued       ? 500 mg ?100 mL/hr over 60 Minutes Intravenous Every 12 hours 08/01/21 2359 08/04/21 0931  ? 08/02/21 0600  metroNIDAZOLE (FLAGYL) IVPB 500 mg  Status:  Discontinued       ? 500 mg ?100 mL/hr over 60 Minutes Intravenous Every 12 hours 08/01/21 2157 08/01/21 2223  ? 08/02/21 0400  ceFEPIme (MAXIPIME) 2 g in sodium chloride 0.9 % 100 mL IVPB  Status:  Discontinued       ? 2 g ?200 mL/hr over  30 Minutes Intravenous Every 8 hours 08/01/21 2208 08/04/21 0931  ? 08/01/21 2245  ceFEPIme (MAXIPIME) 2 g in sodium chloride 0.9 % 100 mL IVPB  Status:  Discontinued       ? 2 g ?200 mL/hr over 30 Minutes Intravenous  Once 08/01/21 2157 08/01/21 2206  ? 08/01/21 2000  vancomycin (VANCOREADY) IVPB 1750 mg/350 mL       ? 1,750 mg ?175 mL/hr over 120 Minutes Intravenous  Once 08/01/21 1957 08/01/21 2338  ? 08/01/21 1730  ceFEPIme (MAXIPIME) 2 g in sodium chloride  0.9 % 100 mL IVPB       ? 2 g ?200 mL/hr over 30 Minutes Intravenous  Once 08/01/21 1720 08/01/21 1810  ? 08/01/21 1730  metroNIDAZOLE (FLAGYL) IVPB 500 mg       ? 500 mg ?100 mL/hr over 60 Minutes Intravenous  Once 08/01/21 1720 08/01/21 1854  ? ?  ? ? ? ? ?Subjective: ?Patient seen and examined at bedside.  Had lip swelling/numbness with chest pain and abdominal pain yesterday with cefepime use.  Currently feels better.  Having significant diarrhea, mostly green watery stool.  Denies worsening abdominal pain. ? ?Objective: ?Vitals:  ? 08/03/21 0448 08/03/21 2050 08/04/21 0006 08/04/21 0015  ?BP: 140/64 133/77 127/64 127/64  ?Pulse: (!) 110 (!) 107 91 92  ?Resp: '18 20 18 18  '$ ?Temp: 99.5 ?F (37.5 ?C) 99.2 ?F (37.3 ?C) 98.7 ?F (37.1 ?C) 98.7 ?F (37.1 ?C)  ?TempSrc: Oral Oral Oral Oral  ?SpO2: 93% 97%  96%  ?Weight:      ?Height:      ? ? ?Intake/Output Summary (Last 24 hours) at 08/04/2021 1109 ?Last data filed at 08/04/2021 0915 ?Gross per 24 hour  ?Intake 1395.71 ml  ?Output --  ?Net 1395.71 ml  ? ?Filed Weights  ? 08/01/21 2015  ?Weight: 87.5 kg  ? ? ?Examination: ? ?General exam: Appears calm and comfortable.  Currently on room air. ?Respiratory system: Bilateral decreased breath sounds at bases ?Cardiovascular system: S1 & S2 heard, tachycardic  ?gastrointestinal system: Abdomen is slightly distended, soft and mildly tender in the lower quadrant.  Normal bowel sounds heard. ?Extremities: No cyanosis, clubbing; trace lower extremity edema present  ?Central nervous system: Alert and oriented. No focal neurological deficits. Moving extremities ?Skin: No rashes, lesions or ulcers ?Psychiatry: Judgement and insight appear normal. Mood & affect appropriate.  ? ? ? ?Data Reviewed: I have personally reviewed following labs and imaging studies ? ?CBC: ?Recent Labs  ?Lab 08/01/21 ?1600 08/02/21 ?0823 08/03/21 ?0342 08/04/21 ?0337  ?WBC 0.6* 0.7* 3.4* 17.8*  ?NEUTROABS 0.1* 0.1* 1.3* 12.5*  ?HGB 14.9 13.8 12.7 12.9  ?HCT  44.3 40.5 37.6 39.2  ?MCV 89.5 91.6 91.5 91.2  ?PLT 189 123* 106* 137*  ? ?Basic Metabolic Panel: ?Recent Labs  ?Lab 08/01/21 ?1600 08/01/21 ?2300 08/02/21 ?0823 08/03/21 ?0342 08/04/21 ?7494  ?NA 136  --  134* 138 139  ?K 4.2  --  4.1 3.6 3.6  ?CL 102  --  105 110 110  ?CO2 26  --  22 20* 22  ?GLUCOSE 135*  --  150* 127* 96  ?BUN 33*  --  27* 30* 20  ?CREATININE 0.76  --  0.64 0.78 0.67  ?CALCIUM 8.8*  --  7.6* 7.4* 8.1*  ?MG  --  2.1 2.3 2.7* 2.5*  ?PHOS  --  1.9* 2.4* 1.3* 1.9*  ? ?GFR: ?Estimated Creatinine Clearance: 74 mL/min (by C-G formula based on SCr of 0.67 mg/dL). ?  Liver Function Tests: ?Recent Labs  ?Lab 08/01/21 ?1600 08/02/21 ?0823 08/03/21 ?0342 08/04/21 ?0337  ?AST 31 29 34 42*  ?ALT 35 37 33 31  ?ALKPHOS 91 63 84 66  ?BILITOT 2.1* 2.1* 2.1* 1.2  ?PROT 6.9 5.8* 5.5* 5.8*  ?ALBUMIN 3.6 2.9* 2.6* 2.7*  ? ?Recent Labs  ?Lab 08/01/21 ?1600  ?LIPASE 28  ? ?No results for input(s): AMMONIA in the last 168 hours. ?Coagulation Profile: ?Recent Labs  ?Lab 08/01/21 ?2300  ?INR 1.4*  ? ?Cardiac Enzymes: ?Recent Labs  ?Lab 08/01/21 ?2300  ?CKTOTAL 32*  ? ?BNP (last 3 results) ?No results for input(s): PROBNP in the last 8760 hours. ?HbA1C: ?No results for input(s): HGBA1C in the last 72 hours. ?CBG: ?No results for input(s): GLUCAP in the last 168 hours. ?Lipid Profile: ?No results for input(s): CHOL, HDL, LDLCALC, TRIG, CHOLHDL, LDLDIRECT in the last 72 hours. ?Thyroid Function Tests: ?Recent Labs  ?  08/01/21 ?2300  ?TSH 0.572  ? ?Anemia Panel: ?Recent Labs  ?  08/02/21 ?0823  ?FERRITIN 338*  ? ?Sepsis Labs: ?Recent Labs  ?Lab 08/01/21 ?1940 08/01/21 ?2300 08/02/21 ?5993 08/02/21 ?1330  ?PROCALCITON  --  8.59  --   --   ?LATICACIDVEN 5.7* 1.8 1.6 2.4*  ? ? ?Recent Results (from the past 240 hour(s))  ?Resp Panel by RT-PCR (Flu A&B, Covid) Nasopharyngeal Swab     Status: Abnormal  ? Collection Time: 08/01/21  7:26 PM  ? Specimen: Nasopharyngeal Swab; Nasopharyngeal(NP) swabs in vial transport medium  ?Result  Value Ref Range Status  ? SARS Coronavirus 2 by RT PCR POSITIVE (A) NEGATIVE Final  ?  Comment: (NOTE) ?SARS-CoV-2 target nucleic acids are DETECTED. ? ?The SARS-CoV-2 RNA is generally detectable in upper respiratory

## 2021-08-05 DIAGNOSIS — C50211 Malignant neoplasm of upper-inner quadrant of right female breast: Secondary | ICD-10-CM | POA: Diagnosis not present

## 2021-08-05 DIAGNOSIS — Z862 Personal history of diseases of the blood and blood-forming organs and certain disorders involving the immune mechanism: Secondary | ICD-10-CM | POA: Diagnosis not present

## 2021-08-05 DIAGNOSIS — D701 Agranulocytosis secondary to cancer chemotherapy: Secondary | ICD-10-CM | POA: Diagnosis not present

## 2021-08-05 DIAGNOSIS — T451X5A Adverse effect of antineoplastic and immunosuppressive drugs, initial encounter: Secondary | ICD-10-CM | POA: Diagnosis not present

## 2021-08-05 DIAGNOSIS — K5792 Diverticulitis of intestine, part unspecified, without perforation or abscess without bleeding: Secondary | ICD-10-CM | POA: Diagnosis not present

## 2021-08-05 DIAGNOSIS — D72829 Elevated white blood cell count, unspecified: Secondary | ICD-10-CM

## 2021-08-05 DIAGNOSIS — U071 COVID-19: Secondary | ICD-10-CM | POA: Diagnosis not present

## 2021-08-05 DIAGNOSIS — C50911 Malignant neoplasm of unspecified site of right female breast: Secondary | ICD-10-CM | POA: Diagnosis not present

## 2021-08-05 LAB — CBC WITH DIFFERENTIAL/PLATELET
Abs Immature Granulocytes: 4.15 10*3/uL — ABNORMAL HIGH (ref 0.00–0.07)
Basophils Absolute: 0 10*3/uL (ref 0.0–0.1)
Basophils Relative: 0 %
Eosinophils Absolute: 0 10*3/uL (ref 0.0–0.5)
Eosinophils Relative: 0 %
HCT: 35.5 % — ABNORMAL LOW (ref 36.0–46.0)
Hemoglobin: 12.2 g/dL (ref 12.0–15.0)
Immature Granulocytes: 22 %
Lymphocytes Relative: 8 %
Lymphs Abs: 1.5 10*3/uL (ref 0.7–4.0)
MCH: 31 pg (ref 26.0–34.0)
MCHC: 34.4 g/dL (ref 30.0–36.0)
MCV: 90.1 fL (ref 80.0–100.0)
Monocytes Absolute: 1.7 10*3/uL — ABNORMAL HIGH (ref 0.1–1.0)
Monocytes Relative: 9 %
Neutro Abs: 11.9 10*3/uL — ABNORMAL HIGH (ref 1.7–7.7)
Neutrophils Relative %: 61 %
Platelets: 120 10*3/uL — ABNORMAL LOW (ref 150–400)
RBC: 3.94 MIL/uL (ref 3.87–5.11)
RDW: 13.1 % (ref 11.5–15.5)
WBC: 19.2 10*3/uL — ABNORMAL HIGH (ref 4.0–10.5)
nRBC: 0.4 % — ABNORMAL HIGH (ref 0.0–0.2)

## 2021-08-05 LAB — COMPREHENSIVE METABOLIC PANEL
ALT: 29 U/L (ref 0–44)
AST: 42 U/L — ABNORMAL HIGH (ref 15–41)
Albumin: 2.7 g/dL — ABNORMAL LOW (ref 3.5–5.0)
Alkaline Phosphatase: 101 U/L (ref 38–126)
Anion gap: 8 (ref 5–15)
BUN: 15 mg/dL (ref 8–23)
CO2: 23 mmol/L (ref 22–32)
Calcium: 7.8 mg/dL — ABNORMAL LOW (ref 8.9–10.3)
Chloride: 111 mmol/L (ref 98–111)
Creatinine, Ser: 0.59 mg/dL (ref 0.44–1.00)
GFR, Estimated: >60 mL/min (ref >60)
Glucose, Bld: 83 mg/dL (ref 70–99)
Potassium: 3.2 mmol/L — ABNORMAL LOW (ref 3.5–5.1)
Sodium: 142 mmol/L (ref 135–145)
Total Bilirubin: 1 mg/dL (ref 0.3–1.2)
Total Protein: 5.6 g/dL — ABNORMAL LOW (ref 6.5–8.1)

## 2021-08-05 LAB — MAGNESIUM: Magnesium: 2 mg/dL (ref 1.7–2.4)

## 2021-08-05 LAB — PHOSPHORUS: Phosphorus: 2.5 mg/dL (ref 2.5–4.6)

## 2021-08-05 MED ORDER — POTASSIUM CHLORIDE 10 MEQ/50ML IV SOLN
10.0000 meq | INTRAVENOUS | Status: AC
  Administered 2021-08-05 (×2): 10 meq via INTRAVENOUS
  Filled 2021-08-05 (×4): qty 50

## 2021-08-05 MED ORDER — TRAMADOL HCL 50 MG PO TABS
25.0000 mg | ORAL_TABLET | Freq: Four times a day (QID) | ORAL | Status: DC | PRN
  Administered 2021-08-05 – 2021-08-06 (×5): 25 mg via ORAL
  Filled 2021-08-05 (×6): qty 1

## 2021-08-05 NOTE — Progress Notes (Signed)
Christie Thomas is feeling a lot better.  She is not having diarrhea when she is.  She is now on Merrem.  She seems to be tolerating this okay.  Her blood counts look all right.  She has not neutropenic.  I suspect the high white cell count is from her Neulasta injection.  All of her cultures have been negative.  The urine shows less than 10,000 units of growth.  She is starting to eat a little bit more.  She is able to swallow better.  There does not appear to be much oral pain or throat pain.  She is not having nearly as much abdominal pain.  Her labs show white cell count of 19.2.  Hemoglobin 12.2.  Platelet count 120,000.  Her potassium is little bit low at 3.2.  I probably would try to replace this with IV potassium.  Her albumin is 2.7.  She has had no cough.  There is no numbness in the face.  She had this when she took Maxipime.  She does have little bit of discomfort in the legs which might be marrow activity from the Neulasta.  Her vital signs show temperature of 98.6.  Pulse 78.  Blood pressure 142/76.  Oral exam shows no thrush.  Lungs are clear bilaterally.  Cardiac exam regular rate and rhythm.  There are no murmurs.  Abdomen is soft.  Bowel sounds are still somewhat decreased.  There is no guarding or rebound tenderness.  Extremity shows no clubbing, cyanosis or edema.  Neurological exam is nonfocal.  Again, I probably would replace the potassium with IV potassium.  She has a Port-A-Cath in.  Hopefully, she will be able to go home soon.  Again she is not neutropenic.  She is not having diarrhea.  Cultures were all negative.  She had this diverticulitis.  I appreciate the wonderful care that she got up from the staff on 4 E.  Lattie Haw, MD  [facility] [phone]

## 2021-08-05 NOTE — Progress Notes (Signed)
PROGRESS NOTE    Christie Thomas  UYQ:[id number] DOB: 08-May-1955 DOA: 08/01/2021 PCP: Midge Minium, MD   Brief Narrative:  66 year old female with history of breast cancer treated with chemotherapy, rheumatoid arthritis, gastroparesis, hyperlipidemia, fatty liver presented with worsening abdominal pain.  She was diagnosed with UTI and acute diverticulitis and started on broad-spectrum antibiotics.  She was also found to be neutropenic.  Oncology was consulted.  Assessment & Plan:   Sepsis: Present on admission Neutropenic fever Acute diverticulitis Possible UTI -Patient was on cefepime and Flagyl but had acute lip swelling/numbness with chest pain/epigastric pain on 08/03/2021 and subsequently cefepime was held.  Flagyl discontinued.  Currently on meropenem since 08/04/2021. -Diarrhea has much improved.  Stool for GI PCR negative; stool for C. difficile negative as well. -Currently afebrile and hemodynamically stable. -Blood cultures negative so far.  Urine culture had insignificant growth -DC IV fluids.  Oral intake is improving.  Leukocytosis -Neutropenia has resolved.  WBC 19.2 today.  Monitor.  Hypokalemia -Replace.  Repeat a.m. labs  Hypophosphatemia -Improved  Hyponatremia -Resolved  Hypocalcemia -Improving  Thrombocytopenia -Questionable cause.  Monitor.  No signs of bleeding  Mildly elevated AST -Only slightly elevated.  Monitor intermittently.  COVID-19 infection -Apparently had COVID-19 infection a month ago.  COVID testing done on presentation was initially positive but repeat was negative.  No further isolation needed.  Possible oral/esophageal thrush -Continue IV Diflucan.  Swallowing is improving.  Hyperlipidemia -Continue statin and fenofibrate  Breast cancer undergoing chemotherapy -Oncology following.  Obesity -Outpatient follow-up   DVT prophylaxis: Lovenox Code Status: Full Family Communication: Husband at bedside Disposition  Plan: Status is: Inpatient Remains inpatient appropriate because: Of severity of illness    Consultants: Oncology  Procedures: None  Antimicrobials:  Anti-infectives (From admission, onward)    Start     Dose/Rate Route Frequency Ordered Stop   08/04/21 1000  fluconazole (DIFLUCAN) tablet 100 mg  Status:  Discontinued        100 mg Oral Daily 08/03/21 0859 08/03/21 1532   08/04/21 1000  fluconazole (DIFLUCAN) IVPB 100 mg  Status:  Discontinued        100 mg 50 mL/hr over 60 Minutes Intravenous Every 24 hours 08/03/21 1532 08/05/21 0647   08/04/21 1000  meropenem (MERREM) 1 g in sodium chloride 0.9 % 100 mL IVPB        1 g 200 mL/hr over 30 Minutes Intravenous Every 8 hours 08/04/21 0931     08/03/21 1000  fluconazole (DIFLUCAN) tablet 100 mg  Status:  Discontinued        100 mg Oral Daily 08/03/21 0719 08/03/21 0859   08/03/21 1000  fluconazole (DIFLUCAN) IVPB 200 mg        200 mg 100 mL/hr over 60 Minutes Intravenous  Once 08/03/21 0859 08/03/21 1300   08/02/21 2100  vancomycin (VANCOREADY) IVPB 1250 mg/250 mL  Status:  Discontinued        1,250 mg 166.7 mL/hr over 90 Minutes Intravenous Every 24 hours 08/01/21 1957 08/03/21 0719   08/02/21 0700  metroNIDAZOLE (FLAGYL) IVPB 500 mg  Status:  Discontinued        500 mg 100 mL/hr over 60 Minutes Intravenous Every 12 hours 08/01/21 2359 08/04/21 0931   08/02/21 0600  metroNIDAZOLE (FLAGYL) IVPB 500 mg  Status:  Discontinued        500 mg 100 mL/hr over 60 Minutes Intravenous Every 12 hours 08/01/21 2157 08/01/21 2223   08/02/21 0400  ceFEPIme (MAXIPIME) 2 g  in sodium chloride 0.9 % 100 mL IVPB  Status:  Discontinued        2 g 200 mL/hr over 30 Minutes Intravenous Every 8 hours 08/01/21 2208 08/04/21 0931   08/01/21 2245  ceFEPIme (MAXIPIME) 2 g in sodium chloride 0.9 % 100 mL IVPB  Status:  Discontinued        2 g 200 mL/hr over 30 Minutes Intravenous  Once 08/01/21 2157 08/01/21 2206   08/01/21 2000  vancomycin (VANCOREADY)  IVPB 1750 mg/350 mL        1,750 mg 175 mL/hr over 120 Minutes Intravenous  Once 08/01/21 1957 08/01/21 2338   08/01/21 1730  ceFEPIme (MAXIPIME) 2 g in sodium chloride 0.9 % 100 mL IVPB        2 g 200 mL/hr over 30 Minutes Intravenous  Once 08/01/21 1720 08/01/21 1810   08/01/21 1730  metroNIDAZOLE (FLAGYL) IVPB 500 mg        500 mg 100 mL/hr over 60 Minutes Intravenous  Once 08/01/21 1720 08/01/21 1854         Subjective: Patient seen and examined at bedside.  Feels much better today.  Oral intake is slightly improving.  Diarrhea has much improved.  Swallowing is improving.  No overnight fever, chest pain or worsening shortness of breath reported. Objective: Vitals:   08/04/21 1321 08/04/21 1701 08/04/21 2153 08/05/21 0536  BP: 128/81 105/73 (!) 158/77 (!) 142/76  Pulse: 86 96 83 78  Resp: '16 18 18 16  '$ Temp: 98.9 F (37.2 C) 99.1 F (37.3 C) 99.1 F (37.3 C) 98.6 F (37 C)  TempSrc: Oral Oral Oral Oral  SpO2: 98% 96% 97% 97%  Weight:      Height:        Intake/Output Summary (Last 24 hours) at 08/05/2021 0745 Last data filed at 08/04/2021 2328 Gross per 24 hour  Intake 1035.33 ml  Output --  Net 1035.33 ml    Filed Weights   08/01/21 2015  Weight: 87.5 kg    Examination:  General: On room air.  No distress ENT/neck: No thyromegaly.  JVD is not elevated  respiratory: Decreased breath sounds at bases bilaterally with some crackles; no wheezing  CVS: S1-S2 heard, rate controlled Abdominal: Soft, nontender, distended mildly; no organomegaly, normal bowel sounds are heard Extremities: Mild lower extremity edema; no cyanosis  CNS: Awake and alert.  No focal neurologic deficit.  Moves extremities Lymph: No obvious lymphadenopathy Skin: No obvious ecchymosis/lesions  psych: Affect, judgment and mood are normal  musculoskeletal: No obvious joint swelling/deformity     Data Reviewed: I have personally reviewed following labs and imaging studies  CBC: Recent  Labs  Lab 08/01/21 1600 08/02/21 0823 08/03/21 0342 08/04/21 0337 08/05/21 0334  WBC 0.6* 0.7* 3.4* 17.8* 19.2*  NEUTROABS 0.1* 0.1* 1.3* 12.5* 11.9*  HGB 14.9 13.8 12.7 12.9 12.2  HCT 44.3 40.5 37.6 39.2 35.5*  MCV 89.5 91.6 91.5 91.2 90.1  PLT 189 123* 106* 137* 120*    Basic Metabolic Panel: Recent Labs  Lab 08/01/21 1600 08/01/21 2300 08/02/21 0823 08/03/21 0342 08/04/21 0337 08/05/21 0334  NA 136  --  134* 138 139 142  K 4.2  --  4.1 3.6 3.6 3.2*  CL 102  --  105 110 110 111  CO2 26  --  22 20* 22 23  GLUCOSE 135*  --  150* 127* 96 83  BUN 33*  --  27* 30* 20 15  CREATININE 0.76  --  0.64  0.78 0.67 0.59  CALCIUM 8.8*  --  7.6* 7.4* 8.1* 7.8*  MG  --  2.1 2.3 2.7* 2.5* 2.0  PHOS  --  1.9* 2.4* 1.3* 1.9* 2.5    GFR: Estimated Creatinine Clearance: 74 mL/min (by C-G formula based on SCr of 0.59 mg/dL). Liver Function Tests: Recent Labs  Lab 08/01/21 1600 08/02/21 0823 08/03/21 0342 08/04/21 0337 08/05/21 0334  AST 31 29 34 42* 42*  ALT 35 37 33 31 29  ALKPHOS 59 47 50 81 101  BILITOT 2.1* 2.1* 2.1* 1.2 1.0  PROT 6.9 5.8* 5.5* 5.8* 5.6*  ALBUMIN 3.6 2.9* 2.6* 2.7* 2.7*    Recent Labs  Lab 08/01/21 1600  LIPASE 28    No results for input(s): AMMONIA in the last 168 hours. Coagulation Profile: Recent Labs  Lab 08/01/21 2300  INR 1.4*    Cardiac Enzymes: Recent Labs  Lab 08/01/21 2300  CKTOTAL 32*    BNP (last 3 results) No results for input(s): PROBNP in the last 8760 hours. HbA1C: No results for input(s): HGBA1C in the last 72 hours. CBG: No results for input(s): GLUCAP in the last 168 hours. Lipid Profile: No results for input(s): CHOL, HDL, LDLCALC, TRIG, CHOLHDL, LDLDIRECT in the last 72 hours. Thyroid Function Tests: No results for input(s): TSH, T4TOTAL, FREET4, T3FREE, THYROIDAB in the last 72 hours.  Anemia Panel: Recent Labs    08/02/21 0823  FERRITIN 338*    Sepsis Labs: Recent Labs  Lab 08/01/21 1940  08/01/21 2300 08/02/21 0823 08/02/21 1330  PROCALCITON  --  8.59  --   --   LATICACIDVEN 5.7* 1.8 1.6 2.4*     Recent Results (from the past 240 hour(s))  Resp Panel by RT-PCR (Flu A&B, Covid) Nasopharyngeal Swab     Status: Abnormal   Collection Time: 08/01/21  7:26 PM   Specimen: Nasopharyngeal Swab; Nasopharyngeal(NP) swabs in vial transport medium  Result Value Ref Range Status   SARS Coronavirus 2 by RT PCR POSITIVE (A) NEGATIVE Final    Comment: (NOTE) SARS-CoV-2 target nucleic acids are DETECTED.  The SARS-CoV-2 RNA is generally detectable in upper respiratory specimens during the acute phase of infection. Positive results are indicative of the presence of the identified virus, but do not rule out bacterial infection or co-infection with other pathogens not detected by the test. Clinical correlation with patient history and other diagnostic information is necessary to determine patient infection status. The expected result is Negative.  Fact Sheet for Patients: EntrepreneurPulse.com.au  Fact Sheet for Healthcare Providers: IncredibleEmployment.be  This test is not yet approved or cleared by the [place] FDA and  has been authorized for detection and/or diagnosis of SARS-CoV-2 by FDA under an Emergency Use Authorization (EUA).  This EUA will remain in effect (meaning this test can be used) for the duration of  the COVID-19 declaration under Section 564(b)(1) of the A ct, 21 U.S.C. section 360bbb-3(b)(1), unless the authorization is terminated or revoked sooner.     Influenza A by PCR NEGATIVE NEGATIVE Final   Influenza B by PCR NEGATIVE NEGATIVE Final    Comment: (NOTE) The Xpert Xpress SARS-CoV-2/FLU/RSV plus assay is intended as an aid in the diagnosis of influenza from Nasopharyngeal swab specimens and should not be used as a sole basis for treatment. Nasal washings and aspirates are unacceptable for Xpert Xpress  SARS-CoV-2/FLU/RSV testing.  Fact Sheet for Patients: EntrepreneurPulse.com.au  Fact Sheet for Healthcare Providers: IncredibleEmployment.be  This test is not yet approved or cleared by  the [employer] and has been authorized for detection and/or diagnosis of SARS-CoV-2 by FDA under an Emergency Use Authorization (EUA). This EUA will remain in effect (meaning this test can be used) for the duration of the COVID-19 declaration under Section 564(b)(1) of the Act, 21 U.S.C. section 360bbb-3(b)(1), unless the authorization is terminated or revoked.  Performed at [facility], [address] [place], [place] [address]   Blood culture (routine x 2)     Status: None (Preliminary result)   Collection Time: 08/01/21  7:32 PM   Specimen: BLOOD LEFT WRIST  Result Value Ref Range Status   Specimen Description   Final    BLOOD LEFT WRIST Performed at [facility], [address], [place], [place] [address]    Special Requests   Final    Blood Culture adequate volume BOTTLES DRAWN AEROBIC AND ANAEROBIC Performed at [facility], [address] [place], [place] [address]    Culture   Final    NO GROWTH 4 DAYS Performed at [facility], [place] [address] [place], [place] [address]    Report Status PENDING  Incomplete  Urine Culture     Status: Abnormal   Collection Time: 08/01/21 11:17 PM   Specimen: Urine, Catheterized  Result Value Ref Range Status   Specimen Description   Final    URINE, CATHETERIZED Performed at [facility] [address], [place], [place] [address]    Special Requests   Final    Immunocompromised Performed at [facility], [place] [address] [place], [place] [address]    Culture (A)  Final    <10,000 COLONIES/mL INSIGNIFICANT GROWTH Performed at [facility] [address] [place],  [address]    Report Status 08/03/2021 FINAL  Final   Resp Panel by RT-PCR (Flu A&B, Covid) Nasopharyngeal Swab     Status: None   Collection Time: 08/02/21  2:00 PM   Specimen: Nasopharyngeal Swab; Nasopharyngeal(NP) swabs in vial transport medium  Result Value Ref Range Status   SARS Coronavirus 2 by RT PCR NEGATIVE NEGATIVE Final    Comment: (NOTE) SARS-CoV-2 target nucleic acids are NOT DETECTED.  The SARS-CoV-2 RNA is generally detectable in upper respiratory specimens during the acute phase of infection. The lowest concentration of SARS-CoV-2 viral copies this assay can detect is 138 copies/mL. A negative result does not preclude SARS-Cov-2 infection and should not be used as the sole basis for treatment or other patient management decisions. A negative result may occur with  improper specimen collection/handling, submission of specimen other than nasopharyngeal swab, presence of viral mutation(s) within the areas targeted by this assay, and inadequate number of viral copies(<138 copies/mL). A negative result must be combined with clinical observations, patient history, and epidemiological information. The expected result is Negative.  Fact Sheet for Patients:  EntrepreneurPulse.com.au  Fact Sheet for Healthcare Providers:  IncredibleEmployment.be  This test is no t yet approved or cleared by the [place] FDA and  has been authorized for detection and/or diagnosis of SARS-CoV-2 by FDA under an Emergency Use Authorization (EUA). This EUA will remain  in effect (meaning this test can be used) for the duration of the COVID-19 declaration under Section 564(b)(1) of the Act, 21 U.S.C.section 360bbb-3(b)(1), unless the authorization is terminated  or revoked sooner.       Influenza A by PCR NEGATIVE NEGATIVE Final   Influenza B by PCR NEGATIVE NEGATIVE Final  Comment: (NOTE) The Xpert Xpress SARS-CoV-2/FLU/RSV plus assay is intended as an aid in the diagnosis of influenza from  Nasopharyngeal swab specimens and should not be used as a sole basis for treatment. Nasal washings and aspirates are unacceptable for Xpert Xpress SARS-CoV-2/FLU/RSV testing.  Fact Sheet for Patients: EntrepreneurPulse.com.au  Fact Sheet for Healthcare Providers: IncredibleEmployment.be  This test is not yet approved or cleared by the [place] FDA and has been authorized for detection and/or diagnosis of SARS-CoV-2 by FDA under an Emergency Use Authorization (EUA). This EUA will remain in effect (meaning this test can be used) for the duration of the COVID-19 declaration under Section 564(b)(1) of the Act, 21 U.S.C. section 360bbb-3(b)(1), unless the authorization is terminated or revoked.  Performed at [facility], [place] [address], [place], [place] [address]   Gastrointestinal Panel by PCR , Stool     Status: None   Collection Time: 08/02/21  3:49 PM   Specimen: Stool  Result Value Ref Range Status   Campylobacter species NOT DETECTED NOT DETECTED Final   Plesimonas shigelloides NOT DETECTED NOT DETECTED Final   Salmonella species NOT DETECTED NOT DETECTED Final   Yersinia enterocolitica NOT DETECTED NOT DETECTED Final   Vibrio species NOT DETECTED NOT DETECTED Final   Vibrio cholerae NOT DETECTED NOT DETECTED Final   Enteroaggregative E coli (EAEC) NOT DETECTED NOT DETECTED Final   Enteropathogenic E coli (EPEC) NOT DETECTED NOT DETECTED Final   Enterotoxigenic E coli (ETEC) NOT DETECTED NOT DETECTED Final   Shiga like toxin producing E coli (STEC) NOT DETECTED NOT DETECTED Final   Shigella/Enteroinvasive E coli (EIEC) NOT DETECTED NOT DETECTED Final   Cryptosporidium NOT DETECTED NOT DETECTED Final   Cyclospora cayetanensis NOT DETECTED NOT DETECTED Final   Entamoeba histolytica NOT DETECTED NOT DETECTED Final   Giardia lamblia NOT DETECTED NOT DETECTED Final   Adenovirus F40/41 NOT DETECTED NOT DETECTED Final    Astrovirus NOT DETECTED NOT DETECTED Final   Norovirus GI/GII NOT DETECTED NOT DETECTED Final   Rotavirus A NOT DETECTED NOT DETECTED Final   Sapovirus (I, II, IV, and V) NOT DETECTED NOT DETECTED Final    Comment: Performed at [facility], [address] [place], [place] [address]  C Difficile Quick Screen (NO PCR Reflex)     Status: None   Collection Time: 08/04/21  9:29 AM   Specimen: STOOL  Result Value Ref Range Status   C Diff antigen NEGATIVE NEGATIVE Final   C Diff toxin NEGATIVE NEGATIVE Final   C Diff interpretation No C. difficile detected.  Final    Comment: Performed at [facility], [place] [address] [place], [place] [address]          Radiology Studies: ECHOCARDIOGRAM COMPLETE  Result Date: 08/04/2021    ECHOCARDIOGRAM REPORT   Patient Name:   LYNNDA WIERSMA Date of Exam: 08/04/2021 Medical Rec #:  [id number]             Height:       64.0 in Accession #:    [id number]            Weight:       192.9 lb Date of Birth:  10-14-1955            BSA:          1.927 m Patient Age:    75 years              BP:  127/64 mmHg Patient Gender: F                     HR:           79 bpm. Exam Location:  Inpatient Procedure: 2D Echo, Cardiac Doppler and Color Doppler Indications:    CHF  History:        Patient has no prior history of Echocardiogram examinations.                 Risk Factors:Dyslipidemia.  Sonographer:    Luisa Hart RDCS Referring Phys: [id number] Antonito Comments: Technically difficult study due to poor echo windows. Image acquisition challenging due to patient body habitus. IMPRESSIONS  1. Left ventricular ejection fraction, by estimation, is 70 to 75%. The left ventricle has hyperdynamic function. The left ventricle has no regional wall motion abnormalities. Left ventricular diastolic parameters were normal.  2. Right ventricular systolic function is normal. The right ventricular size is normal.  3. Left atrial  size was mildly dilated.  4. The mitral valve is grossly normal. No evidence of mitral valve regurgitation. No evidence of mitral stenosis.  5. The aortic valve is grossly normal. Aortic valve regurgitation is not visualized. No aortic stenosis is present.  6. The inferior vena cava is normal in size with greater than 50% respiratory variability, suggesting right atrial pressure of 3 mmHg. Comparison(s): No prior Echocardiogram. Conclusion(s)/Recommendation(s): Normal biventricular function without evidence of hemodynamically significant valvular heart disease. FINDINGS  Left Ventricle: Left ventricular ejection fraction, by estimation, is 70 to 75%. The left ventricle has hyperdynamic function. The left ventricle has no regional wall motion abnormalities. The left ventricular internal cavity size was normal in size. There is no left ventricular hypertrophy. Left ventricular diastolic parameters were normal. Right Ventricle: The right ventricular size is normal. No increase in right ventricular wall thickness. Right ventricular systolic function is normal. Left Atrium: Left atrial size was mildly dilated. Right Atrium: Right atrial size was normal in size. Pericardium: Trivial pericardial effusion is present. Mitral Valve: The mitral valve is grossly normal. No evidence of mitral valve regurgitation. No evidence of mitral valve stenosis. Tricuspid Valve: The tricuspid valve is grossly normal. Tricuspid valve regurgitation is not demonstrated. No evidence of tricuspid stenosis. Aortic Valve: The aortic valve is grossly normal. Aortic valve regurgitation is not visualized. No aortic stenosis is present. Aortic valve mean gradient measures 4.0 mmHg. Aortic valve peak gradient measures 7.1 mmHg. Aortic valve area, by VTI measures 2.79 cm. Pulmonic Valve: The pulmonic valve was not well visualized. Pulmonic valve regurgitation is not visualized. No evidence of pulmonic stenosis. Aorta: The aortic root, ascending aorta  and aortic arch are all structurally normal, with no evidence of dilitation or obstruction. Venous: The inferior vena cava is normal in size with greater than 50% respiratory variability, suggesting right atrial pressure of 3 mmHg. IAS/Shunts: The atrial septum is grossly normal.  LEFT VENTRICLE PLAX 2D LVIDd:         4.50 cm     Diastology LVIDs:         2.10 cm     LV e' medial:    6.64 cm/s LV PW:         1.10 cm     LV E/e' medial:  10.3 LV IVS:        0.60 cm     LV e' lateral:   9.57 cm/s LVOT diam:     2.00 cm  LV E/e' lateral: 7.1 LV SV:         77 LV SV Index:   40 LVOT Area:     3.14 cm  LV Volumes (MOD) LV vol d, MOD A2C: 39.8 ml LV vol d, MOD A4C: 33.4 ml LV vol s, MOD A2C: 11.2 ml LV vol s, MOD A4C: 10.7 ml LV SV MOD A2C:     28.6 ml LV SV MOD A4C:     33.4 ml LV SV MOD BP:      26.6 ml RIGHT VENTRICLE RV Basal diam:  1.90 cm RV Mid diam:    1.50 cm RV S prime:     13.80 cm/s TAPSE (M-mode): 1.9 cm LEFT ATRIUM             Index        RIGHT ATRIUM           Index LA diam:        3.10 cm 1.61 cm/m   RA Area:     15.20 cm LA Vol (A2C):   48.3 ml 25.07 ml/m  RA Volume:   38.20 ml  19.83 ml/m LA Vol (A4C):   73.0 ml 37.89 ml/m LA Biplane Vol: 70.0 ml 36.33 ml/m  AORTIC VALVE                    PULMONIC VALVE AV Area (Vmax):    2.65 cm     PV Vmax:       0.98 m/s AV Area (Vmean):   2.45 cm     PV Vmean:      71.000 cm/s AV Area (VTI):     2.79 cm     PV VTI:        0.197 m AV Vmax:           133.00 cm/s  PV Peak grad:  3.8 mmHg AV Vmean:          96.600 cm/s  PV Mean grad:  2.0 mmHg AV VTI:            0.277 m AV Peak Grad:      7.1 mmHg AV Mean Grad:      4.0 mmHg LVOT Vmax:         112.00 cm/s LVOT Vmean:        75.200 cm/s LVOT VTI:          0.246 m LVOT/AV VTI ratio: 0.89  AORTA Ao Root diam: 2.70 cm Ao Asc diam:  2.70 cm MITRAL VALVE MV Area (PHT): 4.21 cm     SHUNTS MV Decel Time: 180 msec     Systemic VTI:  0.25 m MV E velocity: 68.40 cm/s   Systemic Diam: 2.00 cm MV A velocity: 111.00  cm/s MV E/A ratio:  0.62 Buford Dresser MD Electronically signed by Buford Dresser MD Signature Date/Time: 08/04/2021/1:01:21 PM    Final         Scheduled Meds:  Chlorhexidine Gluconate Cloth  6 each Topical Daily   clonazepam  0.25 mg Oral QHS   enoxaparin (LOVENOX) injection  40 mg Subcutaneous Q24H   fenofibrate  160 mg Oral QHS   rosuvastatin  10 mg Oral QHS   sodium chloride flush  10-40 mL Intracatheter Q12H   Continuous Infusions:  sodium chloride Stopped (08/01/21 1740)   sodium chloride 75 mL/hr at 08/04/21 1715   famotidine (PEPCID) IV 40 mg (08/04/21 2321)   meropenem (MERREM) IV 1 g (08/05/21 0202)  potassium chloride            Aline August, MD [facility] 08/05/2021, 7:45 AM

## 2021-08-06 DIAGNOSIS — K5792 Diverticulitis of intestine, part unspecified, without perforation or abscess without bleeding: Secondary | ICD-10-CM | POA: Diagnosis not present

## 2021-08-06 DIAGNOSIS — B370 Candidal stomatitis: Secondary | ICD-10-CM | POA: Diagnosis not present

## 2021-08-06 DIAGNOSIS — D701 Agranulocytosis secondary to cancer chemotherapy: Secondary | ICD-10-CM | POA: Diagnosis not present

## 2021-08-06 DIAGNOSIS — T451X5A Adverse effect of antineoplastic and immunosuppressive drugs, initial encounter: Secondary | ICD-10-CM | POA: Diagnosis not present

## 2021-08-06 DIAGNOSIS — U071 COVID-19: Secondary | ICD-10-CM | POA: Diagnosis not present

## 2021-08-06 LAB — CBC WITH DIFFERENTIAL/PLATELET
Abs Immature Granulocytes: 1.01 10*3/uL — ABNORMAL HIGH (ref 0.00–0.07)
Band Neutrophils: 2 %
Basophils Absolute: 0 10*3/uL (ref 0.0–0.1)
Basophils Relative: 0 %
Blasts: 0 %
Eosinophils Absolute: 0 10*3/uL (ref 0.0–0.5)
Eosinophils Relative: 0 %
HCT: 34.2 % — ABNORMAL LOW (ref 36.0–46.0)
Hemoglobin: 11.3 g/dL — ABNORMAL LOW (ref 12.0–15.0)
Lymphocytes Relative: 15 %
Lymphs Abs: 2.5 10*3/uL (ref 0.7–4.0)
MCH: 30.1 pg (ref 26.0–34.0)
MCHC: 33 g/dL (ref 30.0–36.0)
MCV: 91 fL (ref 80.0–100.0)
Metamyelocytes Relative: 3 %
Monocytes Absolute: 0 10*3/uL — ABNORMAL LOW (ref 0.1–1.0)
Monocytes Relative: 0 %
Myelocytes: 3 %
Neutro Abs: 13.4 10*3/uL — ABNORMAL HIGH (ref 1.7–7.7)
Neutrophils Relative %: 77 %
Other: 0 %
Platelets: 97 10*3/uL — ABNORMAL LOW (ref 150–400)
Promyelocytes Relative: 0 %
RBC: 3.76 MIL/uL — ABNORMAL LOW (ref 3.87–5.11)
RDW: 13.2 % (ref 11.5–15.5)
WBC: 16.9 10*3/uL — ABNORMAL HIGH (ref 4.0–10.5)
nRBC: 0 /100 WBC
nRBC: 0.5 % — ABNORMAL HIGH (ref 0.0–0.2)

## 2021-08-06 LAB — COMPREHENSIVE METABOLIC PANEL
ALT: 28 U/L (ref 0–44)
AST: 39 U/L (ref 15–41)
Albumin: 2.5 g/dL — ABNORMAL LOW (ref 3.5–5.0)
Alkaline Phosphatase: 91 U/L (ref 38–126)
Anion gap: 7 (ref 5–15)
BUN: 13 mg/dL (ref 8–23)
CO2: 26 mmol/L (ref 22–32)
Calcium: 8.2 mg/dL — ABNORMAL LOW (ref 8.9–10.3)
Chloride: 107 mmol/L (ref 98–111)
Creatinine, Ser: 0.7 mg/dL (ref 0.44–1.00)
GFR, Estimated: >60 mL/min (ref >60)
Glucose, Bld: 106 mg/dL — ABNORMAL HIGH (ref 70–99)
Potassium: 3.3 mmol/L — ABNORMAL LOW (ref 3.5–5.1)
Sodium: 140 mmol/L (ref 135–145)
Total Bilirubin: 1 mg/dL (ref 0.3–1.2)
Total Protein: 5.2 g/dL — ABNORMAL LOW (ref 6.5–8.1)

## 2021-08-06 LAB — CULTURE, BLOOD (ROUTINE X 2)
Culture: NO GROWTH
Special Requests: ADEQUATE

## 2021-08-06 LAB — MAGNESIUM: Magnesium: 1.8 mg/dL (ref 1.7–2.4)

## 2021-08-06 MED ORDER — ENSURE ENLIVE PO LIQD
237.0000 mL | Freq: Two times a day (BID) | ORAL | Status: DC
  Administered 2021-08-06 – 2021-08-07 (×3): 237 mL via ORAL

## 2021-08-06 MED ORDER — FAMOTIDINE 20 MG PO TABS
40.0000 mg | ORAL_TABLET | Freq: Two times a day (BID) | ORAL | Status: DC
  Administered 2021-08-06 – 2021-08-07 (×3): 40 mg via ORAL
  Filled 2021-08-06 (×3): qty 2

## 2021-08-06 MED ORDER — RESOURCE INSTANT PROTEIN PO PWD PACKET
1.0000 | Freq: Three times a day (TID) | ORAL | Status: DC
  Administered 2021-08-07 (×3): 6 g via ORAL
  Filled 2021-08-06 (×3): qty 6

## 2021-08-06 MED ORDER — SENNOSIDES-DOCUSATE SODIUM 8.6-50 MG PO TABS
1.0000 | ORAL_TABLET | Freq: Two times a day (BID) | ORAL | Status: DC
  Administered 2021-08-06: 1 via ORAL
  Filled 2021-08-06 (×3): qty 1

## 2021-08-06 NOTE — Progress Notes (Addendum)
Nutrition Note  RD consulted for diet education.  Pt in room, pt's husband at bedside. Pt with diet questions regarding how to control constipation/diarrhea and to prevent diverticulitis flares.  Pt is s/p chemotherapy for breast cancer 5/11. Recently had COVID-19 a month ago. Has really struggled with loose stools, now states she feels she is getting constipated.  Reviewed low fiber diet with patient and how to slowly introduce fiber into diet. Reviewed soluble and insoluble fiber.  Placed handouts in AVS.  Will place order for Ensure and Beneprotein supplements for pt to try. Reports she is likely to go home tomorrow.     Wt Readings from Last 15 Encounters:  08/01/21 87.5 kg  07/27/21 89.5 kg  07/26/21 87.1 kg  07/22/21 87.5 kg  06/08/21 88.1 kg  05/10/21 87.5 kg  05/05/21 87.7 kg  03/08/21 88.9 kg  03/05/21 89.4 kg  03/03/21 88.9 kg  08/11/20 88.5 kg  02/11/20 87.5 kg  08/06/19 87.1 kg  02/19/19 85.5 kg  02/07/19 84.9 kg    Body mass index is 33.11 kg/m. Patient meets criteria for obesity based on current BMI.   Current diet order is soft, patient is consuming approximately 50% of meals at this time. Labs and medications reviewed.   If nutrition issues arise, please consult RD.   Clayton Bibles, MS, RD, LDN Inpatient Clinical Dietitian Contact information available via Amion

## 2021-08-06 NOTE — Progress Notes (Signed)
PROGRESS NOTE    Christie Thomas  FKC:[id number] DOB: 25-Mar-1955 DOA: 08/01/2021 PCP: Midge Minium, MD   Brief Narrative:  66 year old female with history of breast cancer treated with chemotherapy, rheumatoid arthritis, gastroparesis, hyperlipidemia, fatty liver presented with worsening abdominal pain.  She was diagnosed with UTI and acute diverticulitis and started on broad-spectrum antibiotics.  She was also found to be neutropenic.  Oncology was consulted.  Assessment & Plan:   Sepsis: Present on admission Neutropenic fever Acute diverticulitis Possible UTI -Patient was on cefepime and Flagyl but had acute lip swelling/numbness with chest pain/epigastric pain on 08/03/2021 and subsequently cefepime was held.  Flagyl discontinued.  Currently on meropenem since 08/04/2021. -Diarrhea has much improved.  Feels constipated this morning with worsening abdominal pain.  Will add scheduled Senokot.  Does not feel ready to go home today.  Stool for GI PCR negative; stool for C. difficile negative as well. -Currently afebrile and hemodynamically stable. -Blood cultures negative so far.  Urine culture had insignificant growth -Off IV fluids.  Oral intake is improving.  Leukocytosis -Neutropenia has resolved.  WBC improving to 16.9 today.  Monitor.  Hypokalemia -Replace.  Repeat a.m. labs  Hypophosphatemia -Improved  Hyponatremia -Resolved  Hypocalcemia -Improving  Thrombocytopenia -Questionable cause.  Monitor.  No signs of bleeding  Mildly elevated AST -Resolved.  COVID-19 infection -Apparently had COVID-19 infection a month ago.  COVID testing done on presentation was initially positive but repeat was negative.  No further isolation needed.  Possible oral/esophageal thrush -Continue IV Diflucan.  Swallowing is improving.  Hyperlipidemia -Continue statin and fenofibrate  Breast cancer undergoing chemotherapy -Oncology following.  Obesity -Outpatient  follow-up   DVT prophylaxis: Lovenox Code Status: Full Family Communication: Husband at bedside Disposition Plan: Status is: Inpatient Remains inpatient appropriate because: Of severity of illness    Consultants: Oncology  Procedures: None  Antimicrobials:  Anti-infectives (From admission, onward)    Start     Dose/Rate Route Frequency Ordered Stop   08/04/21 1000  fluconazole (DIFLUCAN) tablet 100 mg  Status:  Discontinued        100 mg Oral Daily 08/03/21 0859 08/03/21 1532   08/04/21 1000  fluconazole (DIFLUCAN) IVPB 100 mg  Status:  Discontinued        100 mg 50 mL/hr over 60 Minutes Intravenous Every 24 hours 08/03/21 1532 08/05/21 0647   08/04/21 1000  meropenem (MERREM) 1 g in sodium chloride 0.9 % 100 mL IVPB        1 g 200 mL/hr over 30 Minutes Intravenous Every 8 hours 08/04/21 0931     08/03/21 1000  fluconazole (DIFLUCAN) tablet 100 mg  Status:  Discontinued        100 mg Oral Daily 08/03/21 0719 08/03/21 0859   08/03/21 1000  fluconazole (DIFLUCAN) IVPB 200 mg        200 mg 100 mL/hr over 60 Minutes Intravenous  Once 08/03/21 0859 08/03/21 1300   08/02/21 2100  vancomycin (VANCOREADY) IVPB 1250 mg/250 mL  Status:  Discontinued        1,250 mg 166.7 mL/hr over 90 Minutes Intravenous Every 24 hours 08/01/21 1957 08/03/21 0719   08/02/21 0700  metroNIDAZOLE (FLAGYL) IVPB 500 mg  Status:  Discontinued        500 mg 100 mL/hr over 60 Minutes Intravenous Every 12 hours 08/01/21 2359 08/04/21 0931   08/02/21 0600  metroNIDAZOLE (FLAGYL) IVPB 500 mg  Status:  Discontinued        500 mg 100 mL/hr  over 60 Minutes Intravenous Every 12 hours 08/01/21 2157 08/01/21 2223   08/02/21 0400  ceFEPIme (MAXIPIME) 2 g in sodium chloride 0.9 % 100 mL IVPB  Status:  Discontinued        2 g 200 mL/hr over 30 Minutes Intravenous Every 8 hours 08/01/21 2208 08/04/21 0931   08/01/21 2245  ceFEPIme (MAXIPIME) 2 g in sodium chloride 0.9 % 100 mL IVPB  Status:  Discontinued        2  g 200 mL/hr over 30 Minutes Intravenous  Once 08/01/21 2157 08/01/21 2206   08/01/21 2000  vancomycin (VANCOREADY) IVPB 1750 mg/350 mL        1,750 mg 175 mL/hr over 120 Minutes Intravenous  Once 08/01/21 1957 08/01/21 2338   08/01/21 1730  ceFEPIme (MAXIPIME) 2 g in sodium chloride 0.9 % 100 mL IVPB        2 g 200 mL/hr over 30 Minutes Intravenous  Once 08/01/21 1720 08/01/21 1810   08/01/21 1730  metroNIDAZOLE (FLAGYL) IVPB 500 mg        500 mg 100 mL/hr over 60 Minutes Intravenous  Once 08/01/21 1720 08/01/21 1854         Subjective: Patient seen and examined at bedside.  Complains of intermittent left lower abdominal pain with constipation.  Does not feel ready to go home today.  No overnight fever or vomiting reported.  Denies any chest pain or shortness of breath.   Objective: Vitals:   08/05/21 0536 08/05/21 1227 08/05/21 1918 08/06/21 0359  BP: (!) 142/76 (!) 141/69 137/70 133/69  Pulse: 78 71 81 72  Resp: '16  20 18  '$ Temp: 98.6 F (37 C) 98.9 F (37.2 C) 98.7 F (37.1 C) 98.2 F (36.8 C)  TempSrc: Oral Oral Oral Oral  SpO2: 97% 96% 97% 97%  Weight:      Height:        Intake/Output Summary (Last 24 hours) at 08/06/2021 0953 Last data filed at 08/06/2021 0122 Gross per 24 hour  Intake 802 ml  Output --  Net 802 ml    Filed Weights   08/01/21 2015  Weight: 87.5 kg    Examination:  General: No acute distress, currently on room air.   ENT/neck: No elevated JVD.  No obvious masses  respiratory: Bilateral decreased breath sounds at bases with some crackles; no wheezing CVS: Rate controlled currently; S1 and S2 are heard  abdominal: Soft, mildly tender in the left lower quadrant, distended slightly no organomegaly, bowel sounds are heard Extremities: Mild lower extremity edema present; no cyanosis  CNS: Alert, awake and oriented.  No focal neurologic deficit.  Moves all extremities  lymph: No palpable cervical lymphadenopathy Skin: No obvious  ecchymosis/petechiae  psych: Normal mood, affect and judgment Musculoskeletal: No obvious joint deformity/tenderness/swelling      Data Reviewed: I have personally reviewed following labs and imaging studies  CBC: Recent Labs  Lab 08/02/21 0823 08/03/21 0342 08/04/21 0337 08/05/21 0334 08/06/21 0433  WBC 0.7* 3.4* 17.8* 19.2* 16.9*  NEUTROABS 0.1* 1.3* 12.5* 11.9* 13.4*  HGB 13.8 12.7 12.9 12.2 11.3*  HCT 40.5 37.6 39.2 35.5* 34.2*  MCV 91.6 91.5 91.2 90.1 91.0  PLT 123* 106* 137* 120* 97*    Basic Metabolic Panel: Recent Labs  Lab 08/01/21 2300 08/02/21 0823 08/03/21 0342 08/04/21 0337 08/05/21 0334 08/06/21 0433  NA  --  134* 138 139 142 140  K  --  4.1 3.6 3.6 3.2* 3.3*  CL  --  105  110 110 111 107  CO2  --  22 20* '22 23 26  '$ GLUCOSE  --  150* 127* 96 83 106*  BUN  --  27* 30* '20 15 13  '$ CREATININE  --  0.64 0.78 0.67 0.59 0.70  CALCIUM  --  7.6* 7.4* 8.1* 7.8* 8.2*  MG 2.1 2.3 2.7* 2.5* 2.0 1.8  PHOS 1.9* 2.4* 1.3* 1.9* 2.5  --     GFR: Estimated Creatinine Clearance: 74 mL/min (by C-G formula based on SCr of 0.7 mg/dL). Liver Function Tests: Recent Labs  Lab 08/02/21 0823 08/03/21 0342 08/04/21 0337 08/05/21 0334 08/06/21 0433  AST 29 34 42* 42* 39  ALT 37 33 '31 29 28  '$ ALKPHOS 47 50 81 101 91  BILITOT 2.1* 2.1* 1.2 1.0 1.0  PROT 5.8* 5.5* 5.8* 5.6* 5.2*  ALBUMIN 2.9* 2.6* 2.7* 2.7* 2.5*    Recent Labs  Lab 08/01/21 1600  LIPASE 28    No results for input(s): AMMONIA in the last 168 hours. Coagulation Profile: Recent Labs  Lab 08/01/21 2300  INR 1.4*    Cardiac Enzymes: Recent Labs  Lab 08/01/21 2300  CKTOTAL 32*    BNP (last 3 results) No results for input(s): PROBNP in the last 8760 hours. HbA1C: No results for input(s): HGBA1C in the last 72 hours. CBG: No results for input(s): GLUCAP in the last 168 hours. Lipid Profile: No results for input(s): CHOL, HDL, LDLCALC, TRIG, CHOLHDL, LDLDIRECT in the last 72  hours. Thyroid Function Tests: No results for input(s): TSH, T4TOTAL, FREET4, T3FREE, THYROIDAB in the last 72 hours.  Anemia Panel: No results for input(s): VITAMINB12, FOLATE, FERRITIN, TIBC, IRON, RETICCTPCT in the last 72 hours.  Sepsis Labs: Recent Labs  Lab 08/01/21 1940 08/01/21 2300 08/02/21 0823 08/02/21 1330  PROCALCITON  --  8.59  --   --   LATICACIDVEN 5.7* 1.8 1.6 2.4*     Recent Results (from the past 240 hour(s))  Resp Panel by RT-PCR (Flu A&B, Covid) Nasopharyngeal Swab     Status: Abnormal   Collection Time: 08/01/21  7:26 PM   Specimen: Nasopharyngeal Swab; Nasopharyngeal(NP) swabs in vial transport medium  Result Value Ref Range Status   SARS Coronavirus 2 by RT PCR POSITIVE (A) NEGATIVE Final    Comment: (NOTE) SARS-CoV-2 target nucleic acids are DETECTED.  The SARS-CoV-2 RNA is generally detectable in upper respiratory specimens during the acute phase of infection. Positive results are indicative of the presence of the identified virus, but do not rule out bacterial infection or co-infection with other pathogens not detected by the test. Clinical correlation with patient history and other diagnostic information is necessary to determine patient infection status. The expected result is Negative.  Fact Sheet for Patients: EntrepreneurPulse.com.au  Fact Sheet for Healthcare Providers: IncredibleEmployment.be  This test is not yet approved or cleared by the [place] FDA and  has been authorized for detection and/or diagnosis of SARS-CoV-2 by FDA under an Emergency Use Authorization (EUA).  This EUA will remain in effect (meaning this test can be used) for the duration of  the COVID-19 declaration under Section 564(b)(1) of the A ct, 21 U.S.C. section 360bbb-3(b)(1), unless the authorization is terminated or revoked sooner.     Influenza A by PCR NEGATIVE NEGATIVE Final   Influenza B by PCR NEGATIVE NEGATIVE  Final    Comment: (NOTE) The Xpert Xpress SARS-CoV-2/FLU/RSV plus assay is intended as an aid in the diagnosis of influenza from Nasopharyngeal swab specimens and should not  be used as a sole basis for treatment. Nasal washings and aspirates are unacceptable for Xpert Xpress SARS-CoV-2/FLU/RSV testing.  Fact Sheet for Patients: EntrepreneurPulse.com.au  Fact Sheet for Healthcare Providers: IncredibleEmployment.be  This test is not yet approved or cleared by the [place] FDA and has been authorized for detection and/or diagnosis of SARS-CoV-2 by FDA under an Emergency Use Authorization (EUA). This EUA will remain in effect (meaning this test can be used) for the duration of the COVID-19 declaration under Section 564(b)(1) of the Act, 21 U.S.C. section 360bbb-3(b)(1), unless the authorization is terminated or revoked.  Performed at [facility], [address] [place], [place] [address]   Blood culture (routine x 2)     Status: None (Preliminary result)   Collection Time: 08/01/21  7:32 PM   Specimen: BLOOD LEFT WRIST  Result Value Ref Range Status   Specimen Description   Final    BLOOD LEFT WRIST Performed at [facility], [address] [place], [place] [address]    Special Requests   Final    Blood Culture adequate volume BOTTLES DRAWN AEROBIC AND ANAEROBIC Performed at [facility], [address] [place], [place] [address]    Culture   Final    NO GROWTH 4 DAYS Performed at [facility], [place] [address] [place], [place] [address]    Report Status PENDING  Incomplete  Urine Culture     Status: Abnormal   Collection Time: 08/01/21 11:17 PM   Specimen: Urine, Catheterized  Result Value Ref Range Status   Specimen Description   Final    URINE, CATHETERIZED Performed at [facility] [address], [place], [place] [address]    Special Requests   Final     Immunocompromised Performed at [facility], [place] [address], [place], [place] [address]    Culture (A)  Final    <10,000 COLONIES/mL INSIGNIFICANT GROWTH Performed at [facility] [address], [place], [place] [address]    Report Status 08/03/2021 FINAL  Final  Resp Panel by RT-PCR (Flu A&B, Covid) Nasopharyngeal Swab     Status: None   Collection Time: 08/02/21  2:00 PM   Specimen: Nasopharyngeal Swab; Nasopharyngeal(NP) swabs in vial transport medium  Result Value Ref Range Status   SARS Coronavirus 2 by RT PCR NEGATIVE NEGATIVE Final    Comment: (NOTE) SARS-CoV-2 target nucleic acids are NOT DETECTED.  The SARS-CoV-2 RNA is generally detectable in upper respiratory specimens during the acute phase of infection. The lowest concentration of SARS-CoV-2 viral copies this assay can detect is 138 copies/mL. A negative result does not preclude SARS-Cov-2 infection and should not be used as the sole basis for treatment or other patient management decisions. A negative result may occur with  improper specimen collection/handling, submission of specimen other than nasopharyngeal swab, presence of viral mutation(s) within the areas targeted by this assay, and inadequate number of viral copies(<138 copies/mL). A negative result must be combined with clinical observations, patient history, and epidemiological information. The expected result is Negative.  Fact Sheet for Patients:  EntrepreneurPulse.com.au  Fact Sheet for Healthcare Providers:  IncredibleEmployment.be  This test is no t yet approved or cleared by the [place] FDA and  has been authorized for detection and/or diagnosis of SARS-CoV-2 by FDA under an Emergency Use Authorization (EUA). This EUA will remain  in effect (meaning this test can be used) for the duration of the COVID-19  declaration under Section 564(b)(1) of the Act, 21 U.S.C.section  360bbb-3(b)(1), unless the authorization is terminated  or revoked sooner.       Influenza A by PCR NEGATIVE NEGATIVE Final   Influenza B by PCR NEGATIVE NEGATIVE Final    Comment: (NOTE) The Xpert Xpress SARS-CoV-2/FLU/RSV plus assay is intended as an aid in the diagnosis of influenza from Nasopharyngeal swab specimens and should not be used as a sole basis for treatment. Nasal washings and aspirates are unacceptable for Xpert Xpress SARS-CoV-2/FLU/RSV testing.  Fact Sheet for Patients: EntrepreneurPulse.com.au  Fact Sheet for Healthcare Providers: IncredibleEmployment.be  This test is not yet approved or cleared by the [place] FDA and has been authorized for detection and/or diagnosis of SARS-CoV-2 by FDA under an Emergency Use Authorization (EUA). This EUA will remain in effect (meaning this test can be used) for the duration of the COVID-19 declaration under Section 564(b)(1) of the Act, 21 U.S.C. section 360bbb-3(b)(1), unless the authorization is terminated or revoked.  Performed at [facility], [place] [address], [place], [place] [address]   Gastrointestinal Panel by PCR , Stool     Status: None   Collection Time: 08/02/21  3:49 PM   Specimen: Stool  Result Value Ref Range Status   Campylobacter species NOT DETECTED NOT DETECTED Final   Plesimonas shigelloides NOT DETECTED NOT DETECTED Final   Salmonella species NOT DETECTED NOT DETECTED Final   Yersinia enterocolitica NOT DETECTED NOT DETECTED Final   Vibrio species NOT DETECTED NOT DETECTED Final   Vibrio cholerae NOT DETECTED NOT DETECTED Final   Enteroaggregative E coli (EAEC) NOT DETECTED NOT DETECTED Final   Enteropathogenic E coli (EPEC) NOT DETECTED NOT DETECTED Final   Enterotoxigenic E coli (ETEC) NOT DETECTED NOT DETECTED Final   Shiga like toxin producing E coli (STEC) NOT DETECTED NOT DETECTED Final   Shigella/Enteroinvasive E coli (EIEC) NOT  DETECTED NOT DETECTED Final   Cryptosporidium NOT DETECTED NOT DETECTED Final   Cyclospora cayetanensis NOT DETECTED NOT DETECTED Final   Entamoeba histolytica NOT DETECTED NOT DETECTED Final   Giardia lamblia NOT DETECTED NOT DETECTED Final   Adenovirus F40/41 NOT DETECTED NOT DETECTED Final   Astrovirus NOT DETECTED NOT DETECTED Final   Norovirus GI/GII NOT DETECTED NOT DETECTED Final   Rotavirus A NOT DETECTED NOT DETECTED Final   Sapovirus (I, II, IV, and V) NOT DETECTED NOT DETECTED Final    Comment: Performed at [facility], [address] [place], [place] [address]  C Difficile Quick Screen (NO PCR Reflex)     Status: None   Collection Time: 08/04/21  9:29 AM   Specimen: STOOL  Result Value Ref Range Status   C Diff antigen NEGATIVE NEGATIVE Final   C Diff toxin NEGATIVE NEGATIVE Final   C Diff interpretation No C. difficile detected.  Final    Comment: Performed at [facility], [place] [address] [place], [place] [address]          Radiology Studies: ECHOCARDIOGRAM COMPLETE  Result Date: 08/04/2021    ECHOCARDIOGRAM REPORT   Patient Name:   MONNIE GUDGEL Date of Exam: 08/04/2021 Medical Rec #:  [id number]             Height:       64.0 in Accession #:    [id number]            Weight:       192.9 lb Date of Birth:  [birth year]  BSA:          1.927 m Patient Age:    61 years              BP:           127/64 mmHg Patient Gender: F                     HR:           79 bpm. Exam Location:  Inpatient Procedure: 2D Echo, Cardiac Doppler and Color Doppler Indications:    CHF  History:        Patient has no prior history of Echocardiogram examinations.                 Risk Factors:Dyslipidemia.  Sonographer:    Luisa Hart RDCS Referring Phys: [id number] [place] Comments: Technically difficult study due to poor echo windows. Image acquisition challenging due to patient body habitus. IMPRESSIONS  1. Left ventricular ejection  fraction, by estimation, is 70 to 75%. The left ventricle has hyperdynamic function. The left ventricle has no regional wall motion abnormalities. Left ventricular diastolic parameters were normal.  2. Right ventricular systolic function is normal. The right ventricular size is normal.  3. Left atrial size was mildly dilated.  4. The mitral valve is grossly normal. No evidence of mitral valve regurgitation. No evidence of mitral stenosis.  5. The aortic valve is grossly normal. Aortic valve regurgitation is not visualized. No aortic stenosis is present.  6. The inferior vena cava is normal in size with greater than 50% respiratory variability, suggesting right atrial pressure of 3 mmHg. Comparison(s): No prior Echocardiogram. Conclusion(s)/Recommendation(s): Normal biventricular function without evidence of hemodynamically significant valvular heart disease. FINDINGS  Left Ventricle: Left ventricular ejection fraction, by estimation, is 70 to 75%. The left ventricle has hyperdynamic function. The left ventricle has no regional wall motion abnormalities. The left ventricular internal cavity size was normal in size. There is no left ventricular hypertrophy. Left ventricular diastolic parameters were normal. Right Ventricle: The right ventricular size is normal. No increase in right ventricular wall thickness. Right ventricular systolic function is normal. Left Atrium: Left atrial size was mildly dilated. Right Atrium: Right atrial size was normal in size. Pericardium: Trivial pericardial effusion is present. Mitral Valve: The mitral valve is grossly normal. No evidence of mitral valve regurgitation. No evidence of mitral valve stenosis. Tricuspid Valve: The tricuspid valve is grossly normal. Tricuspid valve regurgitation is not demonstrated. No evidence of tricuspid stenosis. Aortic Valve: The aortic valve is grossly normal. Aortic valve regurgitation is not visualized. No aortic stenosis is present. Aortic valve mean  gradient measures 4.0 mmHg. Aortic valve peak gradient measures 7.1 mmHg. Aortic valve area, by VTI measures 2.79 cm. Pulmonic Valve: The pulmonic valve was not well visualized. Pulmonic valve regurgitation is not visualized. No evidence of pulmonic stenosis. Aorta: The aortic root, ascending aorta and aortic arch are all structurally normal, with no evidence of dilitation or obstruction. Venous: The inferior vena cava is normal in size with greater than 50% respiratory variability, suggesting right atrial pressure of 3 mmHg. IAS/Shunts: The atrial septum is grossly normal.  LEFT VENTRICLE PLAX 2D LVIDd:         4.50 cm     Diastology LVIDs:         2.10 cm     LV e' medial:    6.64 cm/s LV PW:         1.10  cm     LV E/e' medial:  10.3 LV IVS:        0.60 cm     LV e' lateral:   9.57 cm/s LVOT diam:     2.00 cm     LV E/e' lateral: 7.1 LV SV:         77 LV SV Index:   40 LVOT Area:     3.14 cm  LV Volumes (MOD) LV vol d, MOD A2C: 39.8 ml LV vol d, MOD A4C: 33.4 ml LV vol s, MOD A2C: 11.2 ml LV vol s, MOD A4C: 10.7 ml LV SV MOD A2C:     28.6 ml LV SV MOD A4C:     33.4 ml LV SV MOD BP:      26.6 ml RIGHT VENTRICLE RV Basal diam:  1.90 cm RV Mid diam:    1.50 cm RV S prime:     13.80 cm/s TAPSE (M-mode): 1.9 cm LEFT ATRIUM             Index        RIGHT ATRIUM           Index LA diam:        3.10 cm 1.61 cm/m   RA Area:     15.20 cm LA Vol (A2C):   48.3 ml 25.07 ml/m  RA Volume:   38.20 ml  19.83 ml/m LA Vol (A4C):   73.0 ml 37.89 ml/m LA Biplane Vol: 70.0 ml 36.33 ml/m  AORTIC VALVE                    PULMONIC VALVE AV Area (Vmax):    2.65 cm     PV Vmax:       0.98 m/s AV Area (Vmean):   2.45 cm     PV Vmean:      71.000 cm/s AV Area (VTI):     2.79 cm     PV VTI:        0.197 m AV Vmax:           133.00 cm/s  PV Peak grad:  3.8 mmHg AV Vmean:          96.600 cm/s  PV Mean grad:  2.0 mmHg AV VTI:            0.277 m AV Peak Grad:      7.1 mmHg AV Mean Grad:      4.0 mmHg LVOT Vmax:         112.00 cm/s LVOT  Vmean:        75.200 cm/s LVOT VTI:          0.246 m LVOT/AV VTI ratio: 0.89  AORTA Ao Root diam: 2.70 cm Ao Asc diam:  2.70 cm MITRAL VALVE MV Area (PHT): 4.21 cm     SHUNTS MV Decel Time: 180 msec     Systemic VTI:  0.25 m MV E velocity: 68.40 cm/s   Systemic Diam: 2.00 cm MV A velocity: 111.00 cm/s MV E/A ratio:  0.62 Buford Dresser MD Electronically signed by Buford Dresser MD Signature Date/Time: 08/04/2021/1:01:21 PM    Final         Scheduled Meds:  Chlorhexidine Gluconate Cloth  6 each Topical Daily   clonazepam  0.25 mg Oral QHS   enoxaparin (LOVENOX) injection  40 mg Subcutaneous Q24H   famotidine  40 mg Oral BID   fenofibrate  160 mg Oral QHS   rosuvastatin  10  mg Oral QHS   senna-docusate  1 tablet Oral BID   sodium chloride flush  10-40 mL Intracatheter Q12H   Continuous Infusions:  sodium chloride Stopped (08/01/21 1740)   meropenem (MERREM) IV 1 g (08/06/21 0122)          Aline August, MD [facility] 08/06/2021, 9:53 AM

## 2021-08-06 NOTE — Evaluation (Signed)
Physical Therapy Evaluation Patient Details Name: Christie Thomas MRN: [id number] DOB: Aug 08, 1955 Today's Date: 08/06/2021  History of Present Illness  66 year old female with history of breast cancer treated with chemotherapy, rheumatoid arthritis, gastroparesis, hyperlipidemia, fatty liver presented with worsening abdominal pain.  She was diagnosed with UTI and acute diverticulitis and started on broad-spectrum antibiotics.  She was also found to be neutropenic.  Oncology was consulted.  Clinical Impression  Patient evaluated by Physical Therapy with no further acute PT needs identified. All education has been completed and the patient has no further questions.   Reviewed and educated pt on sit<> stand x5-10 reps at time 3+x/day, importance of balancing rest with light to moderate activity, increasing activity slowly and  with regard for RPE before/during/after activity. Pt verbalizes understanding.   No need for DME or f/u PT at this time.  See below for any follow-up Physical Therapy or equipment needs. PT is signing off. Thank you for this referral.        Recommendations for follow up therapy are one component of a multi-disciplinary discharge planning process, led by the attending physician.  Recommendations may be updated based on patient status, additional functional criteria and insurance authorization.  Follow Up Recommendations No PT follow up    Assistance Recommended at Discharge PRN  Patient can return home with the following  Assistance with cooking/housework    Equipment Recommendations None recommended by PT  Recommendations for Other Services       Functional Status Assessment Patient has had a recent decline in their functional status and demonstrates the ability to make significant improvements in function in a reasonable and predictable amount of time.     Precautions / Restrictions Precautions Precautions: None Restrictions Weight Bearing  Restrictions: No      Mobility  Bed Mobility               General bed mobility comments: NT-pt independent in room    Transfers Overall transfer level: Needs assistance   Transfers: Sit to/from Stand Sit to Stand: Independent                Ambulation/Gait Ambulation/Gait assistance: Modified independent (Device/Increase time) Gait Distance (Feet): 180 Feet Assistive device: None Gait Pattern/deviations: Decreased stride length, Step-through pattern, Decreased dorsiflexion - right, Decreased dorsiflexion - left, Wide base of support       General Gait Details: gait deviations exacerbated with fatigue, pt with slower gaitspeed last ~ 40' d/t fatigue. pt without overt LOB  Stairs            Wheelchair Mobility    Modified Rankin (Stroke Patients Only)       Balance     Sitting balance-Leahy Scale: Normal         Standing balance comment: Fair to good             High level balance activites: Direction changes, Turns, Head turns High Level Balance Comments: no  LOB with above             Pertinent Vitals/Pain Pain Assessment Pain Assessment: No/denies pain    Home Living Family/patient expects to be discharged to:: Private residence Living Arrangements: Spouse/significant other Available Help at Discharge: Family Type of Home: House         Home Layout: One level Home Equipment: None      Prior Function Prior Level of Function : Independent/Modified Independent  Hand Dominance        Extremity/Trunk Assessment   Upper Extremity Assessment Upper Extremity Assessment: Overall WFL for tasks assessed    Lower Extremity Assessment Lower Extremity Assessment: Overall WFL for tasks assessed       Communication   Communication: No difficulties  Cognition Arousal/Alertness: Awake/alert Behavior During Therapy: WFL for tasks assessed/performed Overall Cognitive Status: Within Functional  Limits for tasks assessed                                          General Comments      Exercises     Assessment/Plan    PT Assessment Patient does not need any further PT services  PT Problem List         PT Treatment Interventions      PT Goals (Current goals can be found in the Care Plan section)  Acute Rehab PT Goals PT Goal Formulation: All assessment and education complete, DC therapy    Frequency       Co-evaluation               AM-PAC PT "6 Clicks" Mobility  Outcome Measure Help needed turning from your back to your side while in a flat bed without using bedrails?: None Help needed moving from lying on your back to sitting on the side of a flat bed without using bedrails?: None Help needed moving to and from a bed to a chair (including a wheelchair)?: None Help needed standing up from a chair using your arms (e.g., wheelchair or bedside chair)?: None Help needed to walk in hospital room?: None Help needed climbing 3-5 steps with a railing? : None 6 Click Score: 24    End of Session   Activity Tolerance: Patient tolerated treatment well Patient left: in chair;with call bell/phone within reach   PT Visit Diagnosis: Difficulty in walking, not elsewhere classified (R26.2)    Time: 3007-6226 PT Time Calculation (min) (ACUTE ONLY): 19 min   Charges:              Baxter Flattery, PT  Acute Rehab Dept ([place]) [phone] Pager [phone]  08/06/2021   [facility] 08/06/2021, 4:36 PM

## 2021-08-06 NOTE — Discharge Instructions (Signed)

## 2021-08-06 NOTE — TOC Progression Note (Signed)
Transition of Care [facility]) - Progression Note    Patient Details  Name: PRINCES FINGER MRN: [id number] Date of Birth: September 23, 1955  Transition of Care [facility] CM/SW Contact  Leeroy Cha, RN Phone Number: 08/06/2021, 8:47 AM  Clinical Narrative:    Following for toc needs   Expected Discharge Plan: Home/Self Care Barriers to Discharge: No Barriers Identified  Expected Discharge Plan and Services Expected Discharge Plan: Home/Self Care   Discharge Planning Services: CM Consult   Living arrangements for the past 2 months: Single Family Home                                       Social Determinants of Health (SDOH) Interventions    Readmission Risk Interventions     View : No data to display.

## 2021-08-06 NOTE — Progress Notes (Signed)
[name] is slowly improving.  She still has some issues with the antibiotics.  I am not sure exactly why should be having issues.  She says that when she gets antibiotics she seems to swell up in the abdomen.  She says that Pepcid helps this.  Her labs look okay.  Her white cell count 16.9.  Hemoglobin 11.3.  Platelet count 97,000.  Her potassium is 3.3.  She got some supplemental potassium yesterday today.  She has had no diarrhea.  She is having increased intake with her food.  She was ambulating yesterday.  She got quite tired.  She has had no problems with fever.  I do not think there is any urinary tract infection.  I would like to think that this diverticulitis is improving.  Her vital signs are temperature of 98.2.  Pulse 72.  Blood pressure 133/69.  Her abdomen is soft.  Bowel sounds are present.  There is no guarding or rebound tenderness.  Lungs are clear.  Cardiac exam regular rate and rhythm.  I would like to think that Christie Thomas can go home soon.  Her potassium is still little on the low side.  I probably would give her some more IV potassium.  I just do not think she could tolerate oral potassium.  She seems to have a very sensitive intestinal system.  She would like to see a dietitian to try to give her some guidance as to what she can eat.  She is not neutropenic.  I am not too worried about the platelet count being down a little bit.  Hopefully, again, she will be able to go home at some point over the weekend.  Lattie Haw, MD  [facility] [phone]

## 2021-08-07 DIAGNOSIS — B3781 Candidal esophagitis: Secondary | ICD-10-CM | POA: Diagnosis not present

## 2021-08-07 DIAGNOSIS — C50211 Malignant neoplasm of upper-inner quadrant of right female breast: Secondary | ICD-10-CM | POA: Diagnosis not present

## 2021-08-07 DIAGNOSIS — D701 Agranulocytosis secondary to cancer chemotherapy: Secondary | ICD-10-CM | POA: Diagnosis not present

## 2021-08-07 DIAGNOSIS — K5792 Diverticulitis of intestine, part unspecified, without perforation or abscess without bleeding: Secondary | ICD-10-CM | POA: Diagnosis not present

## 2021-08-07 LAB — CBC WITH DIFFERENTIAL/PLATELET
Abs Immature Granulocytes: 1.87 10*3/uL — ABNORMAL HIGH (ref 0.00–0.07)
Basophils Absolute: 0 10*3/uL (ref 0.0–0.1)
Basophils Relative: 0 %
Eosinophils Absolute: 0 10*3/uL (ref 0.0–0.5)
Eosinophils Relative: 0 %
HCT: 34.5 % — ABNORMAL LOW (ref 36.0–46.0)
Hemoglobin: 11.2 g/dL — ABNORMAL LOW (ref 12.0–15.0)
Immature Granulocytes: 16 %
Lymphocytes Relative: 14 %
Lymphs Abs: 1.6 10*3/uL (ref 0.7–4.0)
MCH: 29.9 pg (ref 26.0–34.0)
MCHC: 32.5 g/dL (ref 30.0–36.0)
MCV: 92 fL (ref 80.0–100.0)
Monocytes Absolute: 1.1 10*3/uL — ABNORMAL HIGH (ref 0.1–1.0)
Monocytes Relative: 9 %
Neutro Abs: 7 10*3/uL (ref 1.7–7.7)
Neutrophils Relative %: 61 %
Platelets: 94 10*3/uL — ABNORMAL LOW (ref 150–400)
RBC: 3.75 MIL/uL — ABNORMAL LOW (ref 3.87–5.11)
RDW: 13.2 % (ref 11.5–15.5)
WBC: 11.5 10*3/uL — ABNORMAL HIGH (ref 4.0–10.5)
nRBC: 0.5 % — ABNORMAL HIGH (ref 0.0–0.2)

## 2021-08-07 LAB — COMPREHENSIVE METABOLIC PANEL
ALT: 29 U/L (ref 0–44)
AST: 37 U/L (ref 15–41)
Albumin: 2.6 g/dL — ABNORMAL LOW (ref 3.5–5.0)
Alkaline Phosphatase: 97 U/L (ref 38–126)
Anion gap: 4 — ABNORMAL LOW (ref 5–15)
BUN: 11 mg/dL (ref 8–23)
CO2: 30 mmol/L (ref 22–32)
Calcium: 8.7 mg/dL — ABNORMAL LOW (ref 8.9–10.3)
Chloride: 108 mmol/L (ref 98–111)
Creatinine, Ser: 0.6 mg/dL (ref 0.44–1.00)
GFR, Estimated: >60 mL/min (ref >60)
Glucose, Bld: 105 mg/dL — ABNORMAL HIGH (ref 70–99)
Potassium: 3.7 mmol/L (ref 3.5–5.1)
Sodium: 142 mmol/L (ref 135–145)
Total Bilirubin: 0.7 mg/dL (ref 0.3–1.2)
Total Protein: 5.4 g/dL — ABNORMAL LOW (ref 6.5–8.1)

## 2021-08-07 LAB — MAGNESIUM: Magnesium: 2 mg/dL (ref 1.7–2.4)

## 2021-08-07 MED ORDER — LIDOCAINE-PRILOCAINE 2.5-2.5 % EX CREA: 1.0000 "application " | TOPICAL_CREAM | Freq: Every day | CUTANEOUS | Status: AC | PRN

## 2021-08-07 MED ORDER — CLONAZEPAM 0.5 MG PO TABS: 0.2500 mg | ORAL_TABLET | Freq: Every day | ORAL | Status: AC

## 2021-08-07 MED ORDER — FLUTICASONE PROPIONATE 50 MCG/ACT NA SUSP: 1.0000 | Freq: Every day | NASAL | Status: AC | PRN

## 2021-08-07 MED ORDER — FLUCONAZOLE 100 MG PO TABS: 100.0000 mg | ORAL_TABLET | Freq: Every day | ORAL | 0 refills | Status: AC

## 2021-08-07 MED ORDER — FAMOTIDINE 40 MG PO TABS: 40.0000 mg | ORAL_TABLET | Freq: Two times a day (BID) | ORAL | 0 refills | Status: DC

## 2021-08-07 MED ORDER — ROSUVASTATIN CALCIUM 10 MG PO TABS: 10.0000 mg | ORAL_TABLET | Freq: Every day | ORAL | Status: AC

## 2021-08-07 MED ORDER — SODIUM CHLORIDE 0.9 % IV SOLN
1.0000 g | Freq: Three times a day (TID) | INTRAVENOUS | Status: DC
  Administered 2021-08-07: 1 g via INTRAVENOUS
  Filled 2021-08-07: qty 1
  Filled 2021-08-07 (×2): qty 20

## 2021-08-07 MED ORDER — AMOXICILLIN-POT CLAVULANATE 875-125 MG PO TABS
1.0000 | ORAL_TABLET | Freq: Two times a day (BID) | ORAL | 0 refills | Status: AC
Start: 2021-08-08 — End: 2021-08-11

## 2021-08-07 MED ORDER — HEPARIN SOD (PORK) LOCK FLUSH 100 UNIT/ML IV SOLN: 500.0000 [IU] | INTRAVENOUS | Status: DC | PRN

## 2021-08-07 NOTE — Progress Notes (Signed)
RN provided and explained discharge instructions. All questions answered. Port deaccessed by IV team. Pt dressed in personal clothing and taken to the main entrance via wheelchair.

## 2021-08-07 NOTE — Discharge Summary (Signed)
Physician Discharge Summary  Christie Thomas SHF:[id number] DOB: 05/09/1955 DOA: 08/01/2021  PCP: Midge Minium, MD  Admit date: 08/01/2021 Discharge date: 08/07/2021  Admitted From: Home Disposition: Home  Recommendations for Outpatient Follow-up:  Follow up with PCP in 1 week with repeat CBC/BMP Outpatient follow-up with oncology Follow up in ED if symptoms worsen or new appear   Home Health: No Equipment/Devices: None  Discharge Condition: Stable CODE STATUS: Full Diet recommendation: Heart healthy/soft diet Brief/Interim Summary: 66 year old female with history of breast cancer treated with chemotherapy, rheumatoid arthritis, gastroparesis, hyperlipidemia, fatty liver presented with worsening abdominal pain.  She was diagnosed with UTI and acute diverticulitis and started on broad-spectrum antibiotics.  She was also found to be neutropenic.  Oncology was consulted.  During the hospitalization, her antibiotics were switched to meropenem.  Subsequently, her condition has gradually improved.  She is tolerating diet, hemodynamically stable with improving leukocytosis.  She feels much better today.  She will be discharged home on 3 more days of Augmentin.  Outpatient follow-up with oncology and PCP.  Discharge Diagnoses:   Sepsis: Present on admission: Resolved Neutropenic fever Acute diverticulitis Possible UTI -Patient was on cefepime and Flagyl but had acute lip swelling/numbness with chest pain/epigastric pain on 08/03/2021 and subsequently cefepime was held.  Flagyl discontinued.  Currently on meropenem since 08/04/2021. -Diarrhea has much improved.  Stool for GI PCR negative; stool for C. difficile negative as well. -Currently afebrile and hemodynamically stable. -Blood cultures negative so far.  Urine culture had insignificant growth -Off IV fluids.  Oral intake is improving. -Feels much better today with improving abdominal pain.  Feels okay to go home today.   Discharge home on 3 more days of oral Augmentin after getting 2 more doses of IV meropenem today. -Outpatient follow-up with PCP and oncology   Leukocytosis -Neutropenia has resolved.  WBC improving to 11.5 today.  Monitor.   Hypokalemia -Improved  Hypophosphatemia -Improved   Hyponatremia -Resolved   Hypocalcemia -Improving   Thrombocytopenia -Questionable cause.  Monitor.  No signs of bleeding.  Outpatient follow-up   Mildly elevated AST -Resolved.   COVID-19 infection -Apparently had COVID-19 infection a month ago.  COVID testing done on presentation was initially positive but repeat was negative.  No further isolation needed.   Possible oral/esophageal thrush -Treated with IV Diflucan.  Swallowing is improving.  Discharge home on Diflucan 100 mg daily for 5 days.    Hyperlipidemia -Continue statin and fenofibrate   Breast cancer undergoing chemotherapy -Oncology following.  Outpatient follow-up with oncology   Obesity -Outpatient follow-up     Discharge Instructions  Discharge Instructions     Diet general   Complete by: As directed    Soft diet   Increase activity slowly   Complete by: As directed    No wound care   Complete by: As directed       Allergies as of 08/07/2021       Reactions   Iodine Anaphylaxis   Penicillins Anaphylaxis   Throat swelling   Shellfish Allergy Anaphylaxis   Cefepime Swelling   Lips swelling and numbness; chest tightness   Cephalexin    Other reaction(s): Unknown   Ciprofloxacin    Other reaction(s): Unknown   Codeine Other (See Comments)   headache   Metoclopramide Other (See Comments)        Medication List     STOP taking these medications    oxyCODONE 5 MG immediate release tablet Commonly known as: Roxicodone  TAKE these medications    acetaminophen 500 MG tablet Commonly known as: TYLENOL Take 500 mg by mouth every 6 (six) hours as needed for moderate pain.   albuterol 108 (90 Base)  MCG/ACT inhaler Commonly known as: VENTOLIN HFA Inhale 2 puffs into the lungs every 4 (four) hours as needed.   amoxicillin-clavulanate 875-125 MG tablet Commonly known as: AUGMENTIN Take 1 tablet by mouth 2 (two) times daily for 3 days. Start taking on: Aug 08, 2021   clonazePAM 0.5 MG tablet Commonly known as: KLONOPIN Take 0.5 tablets (0.25 mg total) by mouth at bedtime.   dexamethasone 4 MG tablet Commonly known as: DECADRON Take 2 tablets (8 mg total) by mouth 2 (two) times daily. Start the day before Taxotere. Then again the day after chemo for 3 days.   famotidine 40 MG tablet Commonly known as: PEPCID Take 1 tablet (40 mg total) by mouth 2 (two) times daily.   fenofibrate 160 MG tablet TAKE 1 TABLET ONCE DAILY WITH FOOD. What changed:  how much to take how to take this when to take this additional instructions   fluticasone 50 MCG/ACT nasal spray Commonly known as: FLONASE Place 1-2 sprays into both nostrils daily as needed for allergies.   lidocaine-prilocaine cream Commonly known as: EMLA Apply 1 application. topically daily as needed. Apply to affected area once What changed:  how much to take how to take this when to take this reasons to take this   naproxen sodium 220 MG tablet Commonly known as: ALEVE Take 220 mg by mouth daily as needed (pain).   ondansetron 8 MG tablet Commonly known as: Zofran Take 1 tablet (8 mg total) by mouth 2 (two) times daily as needed for refractory nausea / vomiting. Start on day 3 after chemo.   prochlorperazine 10 MG tablet Commonly known as: COMPAZINE Take 1 tablet (10 mg total) by mouth every 6 (six) hours as needed (Nausea or vomiting). What changed: reasons to take this   rosuvastatin 10 MG tablet Commonly known as: CRESTOR Take 1 tablet (10 mg total) by mouth at bedtime.   SYSTANE COMPLETE OP Place 1 drop into both eyes daily as needed (dry eyes).   traMADol 50 MG tablet Commonly known as: ULTRAM Take 50  mg by mouth every 6 (six) hours as needed for severe pain.        Follow-up Information     Midge Minium, MD. Schedule an appointment as soon as possible for a visit in 1 week(s).   Specialty: Family Medicine Contact information: [phone] [place] [address] [phone]                Allergies  Allergen Reactions   Iodine Anaphylaxis   Penicillins Anaphylaxis    Throat swelling    Shellfish Allergy Anaphylaxis   Cefepime Swelling    Lips swelling and numbness; chest tightness   Cephalexin     Other reaction(s): Unknown   Ciprofloxacin     Other reaction(s): Unknown   Codeine Other (See Comments)    headache   Metoclopramide Other (See Comments)    Consultations: Oncology   Procedures/Studies: CT ABDOMEN PELVIS WO CONTRAST  Result Date: 08/01/2021 CLINICAL DATA:  Abdominal pain, recently initiated chemotherapy for breast cancer * Tracking Code: BO * EXAM: CT ABDOMEN AND PELVIS WITHOUT CONTRAST TECHNIQUE: Multidetector CT imaging of the abdomen and pelvis was performed following the standard protocol without IV contrast. RADIATION DOSE REDUCTION: This exam was performed according to the  departmental dose-optimization program which includes automated exposure control, adjustment of the mA and/or kV according to patient size and/or use of iterative reconstruction technique. COMPARISON:  11/27/2007 FINDINGS: Lower chest: No acute abnormality.  Bilateral breast implants. Hepatobiliary: No focal liver abnormality is seen. Status post cholecystectomy. No biliary dilatation. Pancreas: Unremarkable. No pancreatic ductal dilatation or surrounding inflammatory changes. Spleen: Normal in size without significant abnormality. Adrenals/Urinary Tract: Adrenal glands are unremarkable. Simple, benign exophytic cyst of the anterior superior pole of the right kidney, for which no further follow-up or characterization is required (series 2, image 32). Kidneys are  otherwise normal, without renal calculi, solid lesion, or hydronephrosis. Bladder is unremarkable. Stomach/Bowel: Stomach is within normal limits. Appendix appears normal. Descending and sigmoid diverticulosis. Large burden of stool in the colon proximal to the rectosigmoid junction. Fat stranding and inflammatory fluid about the diverticular proximal sigmoid (series 2, image 66). Vascular/Lymphatic: Aortic atherosclerosis. No enlarged abdominal or pelvic lymph nodes. Stable, benign subcentimeter retroperitoneal lymph nodes (series 2, image 29, 32). Reproductive: No mass or other significant abnormality. Other: No abdominal wall hernia or abnormality. No ascites. Musculoskeletal: No acute or significant osseous findings. IMPRESSION: 1. Descending and sigmoid diverticulosis. Fat stranding and inflammatory fluid about the diverticular proximal sigmoid, consistent with acute diverticulitis. No evidence of perforation or abscess. 2. Large burden of stool in the colon proximal to the rectosigmoid junction. 3. No noncontrast evidence of lymphadenopathy or metastatic disease in the abdomen or pelvis. Aortic Atherosclerosis (ICD10-I70.0). Electronically Signed   By: Delanna Ahmadi M.D.   On: 08/01/2021 16:33   DG Chest Port 1 View  Result Date: 08/01/2021 CLINICAL DATA:  COVID. Patient recently started on chemo for breast cancer. EXAM: PORTABLE CHEST 1 VIEW COMPARISON:  Aug 15, 2021 FINDINGS: There is stable left-sided venous Port-A-Cath positioning. The heart size and mediastinal contours are within normal limits. Mildly decreased lung volumes are seen. Both lungs are clear. The visualized skeletal structures are unremarkable. IMPRESSION: Low lung volumes without active cardiopulmonary disease. Electronically Signed   By: Virgina Norfolk M.D.   On: 08/01/2021 22:29   DG Chest Port 1 View  Result Date: 07/26/2021 CLINICAL DATA:  Port-A-Cath EXAM: PORTABLE CHEST 1 VIEW COMPARISON:  Radiograph 08/22/2016 FINDINGS:  There is a new left chest port with catheter tip overlying the distal superior vena cava. Unchanged cardiomediastinal silhouette. No focal airspace consolidation. There is no large pleural effusion. No visible pneumothorax. No acute osseous abnormality. IMPRESSION: Left chest port catheter tip overlies the distal superior vena cava. Electronically Signed   By: Maurine Simmering M.D.   On: 07/26/2021 09:40   DG C-Arm 1-60 Min-No Report  Result Date: 07/26/2021 Fluoroscopy was utilized by the requesting physician.  No radiographic interpretation.   ECHOCARDIOGRAM COMPLETE  Result Date: 08/04/2021    ECHOCARDIOGRAM REPORT   Patient Name:   Christie Thomas Date of Exam: 08/04/2021 Medical Rec #:  [id number]             Height:       64.0 in Accession #:    [id number]            Weight:       192.9 lb Date of Birth:  01-23-1956            BSA:          1.927 m Patient Age:    24 years              BP:  127/64 mmHg Patient Gender: F                     HR:           79 bpm. Exam Location:  Inpatient Procedure: 2D Echo, Cardiac Doppler and Color Doppler Indications:    CHF  History:        Patient has no prior history of Echocardiogram examinations.                 Risk Factors:Dyslipidemia.  Sonographer:    Luisa Hart RDCS Referring Phys: [id number] [place] Comments: Technically difficult study due to poor echo windows. Image acquisition challenging due to patient body habitus. IMPRESSIONS  1. Left ventricular ejection fraction, by estimation, is 70 to 75%. The left ventricle has hyperdynamic function. The left ventricle has no regional wall motion abnormalities. Left ventricular diastolic parameters were normal.  2. Right ventricular systolic function is normal. The right ventricular size is normal.  3. Left atrial size was mildly dilated.  4. The mitral valve is grossly normal. No evidence of mitral valve regurgitation. No evidence of mitral stenosis.  5. The aortic valve is grossly  normal. Aortic valve regurgitation is not visualized. No aortic stenosis is present.  6. The inferior vena cava is normal in size with greater than 50% respiratory variability, suggesting right atrial pressure of 3 mmHg. Comparison(s): No prior Echocardiogram. Conclusion(s)/Recommendation(s): Normal biventricular function without evidence of hemodynamically significant valvular heart disease. FINDINGS  Left Ventricle: Left ventricular ejection fraction, by estimation, is 70 to 75%. The left ventricle has hyperdynamic function. The left ventricle has no regional wall motion abnormalities. The left ventricular internal cavity size was normal in size. There is no left ventricular hypertrophy. Left ventricular diastolic parameters were normal. Right Ventricle: The right ventricular size is normal. No increase in right ventricular wall thickness. Right ventricular systolic function is normal. Left Atrium: Left atrial size was mildly dilated. Right Atrium: Right atrial size was normal in size. Pericardium: Trivial pericardial effusion is present. Mitral Valve: The mitral valve is grossly normal. No evidence of mitral valve regurgitation. No evidence of mitral valve stenosis. Tricuspid Valve: The tricuspid valve is grossly normal. Tricuspid valve regurgitation is not demonstrated. No evidence of tricuspid stenosis. Aortic Valve: The aortic valve is grossly normal. Aortic valve regurgitation is not visualized. No aortic stenosis is present. Aortic valve mean gradient measures 4.0 mmHg. Aortic valve peak gradient measures 7.1 mmHg. Aortic valve area, by VTI measures 2.79 cm. Pulmonic Valve: The pulmonic valve was not well visualized. Pulmonic valve regurgitation is not visualized. No evidence of pulmonic stenosis. Aorta: The aortic root, ascending aorta and aortic arch are all structurally normal, with no evidence of dilitation or obstruction. Venous: The inferior vena cava is normal in size with greater than 50%  respiratory variability, suggesting right atrial pressure of 3 mmHg. IAS/Shunts: The atrial septum is grossly normal.  LEFT VENTRICLE PLAX 2D LVIDd:         4.50 cm     Diastology LVIDs:         2.10 cm     LV e' medial:    6.64 cm/s LV PW:         1.10 cm     LV E/e' medial:  10.3 LV IVS:        0.60 cm     LV e' lateral:   9.57 cm/s LVOT diam:     2.00 cm  LV E/e' lateral: 7.1 LV SV:         77 LV SV Index:   40 LVOT Area:     3.14 cm  LV Volumes (MOD) LV vol d, MOD A2C: 39.8 ml LV vol d, MOD A4C: 33.4 ml LV vol s, MOD A2C: 11.2 ml LV vol s, MOD A4C: 10.7 ml LV SV MOD A2C:     28.6 ml LV SV MOD A4C:     33.4 ml LV SV MOD BP:      26.6 ml RIGHT VENTRICLE RV Basal diam:  1.90 cm RV Mid diam:    1.50 cm RV S prime:     13.80 cm/s TAPSE (M-mode): 1.9 cm LEFT ATRIUM             Index        RIGHT ATRIUM           Index LA diam:        3.10 cm 1.61 cm/m   RA Area:     15.20 cm LA Vol (A2C):   48.3 ml 25.07 ml/m  RA Volume:   38.20 ml  19.83 ml/m LA Vol (A4C):   73.0 ml 37.89 ml/m LA Biplane Vol: 70.0 ml 36.33 ml/m  AORTIC VALVE                    PULMONIC VALVE AV Area (Vmax):    2.65 cm     PV Vmax:       0.98 m/s AV Area (Vmean):   2.45 cm     PV Vmean:      71.000 cm/s AV Area (VTI):     2.79 cm     PV VTI:        0.197 m AV Vmax:           133.00 cm/s  PV Peak grad:  3.8 mmHg AV Vmean:          96.600 cm/s  PV Mean grad:  2.0 mmHg AV VTI:            0.277 m AV Peak Grad:      7.1 mmHg AV Mean Grad:      4.0 mmHg LVOT Vmax:         112.00 cm/s LVOT Vmean:        75.200 cm/s LVOT VTI:          0.246 m LVOT/AV VTI ratio: 0.89  AORTA Ao Root diam: 2.70 cm Ao Asc diam:  2.70 cm MITRAL VALVE MV Area (PHT): 4.21 cm     SHUNTS MV Decel Time: 180 msec     Systemic VTI:  0.25 m MV E velocity: 68.40 cm/s   Systemic Diam: 2.00 cm MV A velocity: 111.00 cm/s MV E/A ratio:  0.62 Buford Dresser MD Electronically signed by Buford Dresser MD Signature Date/Time: 08/04/2021/1:01:21 PM    Final        Subjective: Patient seen and examined at bedside.  Feels much better and tolerating diet and having bowel movements.  Feels ready to go home today.  Denies worsening abdominal pain, fever or vomiting.  Discharge Exam: Vitals:   08/06/21 2044 08/07/21 0544  BP: 136/74 128/64  Pulse: 83 77  Resp: 16 18  Temp: 98.4 F (36.9 C) 98.2 F (36.8 C)  SpO2: 97% 97%    General: Pt is alert, awake, not in acute distress.  Currently on room air. Cardiovascular: rate controlled, S1/S2 + Respiratory: bilateral decreased breath sounds  at bases Abdominal: Soft, obese, NT, ND, bowel sounds + Extremities: Trace lower extremity edema, no cyanosis    The results of significant diagnostics from this hospitalization (including imaging, microbiology, ancillary and laboratory) are listed below for reference.     Microbiology: Recent Results (from the past 240 hour(s))  Resp Panel by RT-PCR (Flu A&B, Covid) Nasopharyngeal Swab     Status: Abnormal   Collection Time: 08/01/21  7:26 PM   Specimen: Nasopharyngeal Swab; Nasopharyngeal(NP) swabs in vial transport medium  Result Value Ref Range Status   SARS Coronavirus 2 by RT PCR POSITIVE (A) NEGATIVE Final    Comment: (NOTE) SARS-CoV-2 target nucleic acids are DETECTED.  The SARS-CoV-2 RNA is generally detectable in upper respiratory specimens during the acute phase of infection. Positive results are indicative of the presence of the identified virus, but do not rule out bacterial infection or co-infection with other pathogens not detected by the test. Clinical correlation with patient history and other diagnostic information is necessary to determine patient infection status. The expected result is Negative.  Fact Sheet for Patients: EntrepreneurPulse.com.au  Fact Sheet for Healthcare Providers: IncredibleEmployment.be  This test is not yet approved or cleared by the [place] FDA and  has been  authorized for detection and/or diagnosis of SARS-CoV-2 by FDA under an Emergency Use Authorization (EUA).  This EUA will remain in effect (meaning this test can be used) for the duration of  the COVID-19 declaration under Section 564(b)(1) of the A ct, 21 U.S.C. section 360bbb-3(b)(1), unless the authorization is terminated or revoked sooner.     Influenza A by PCR NEGATIVE NEGATIVE Final   Influenza B by PCR NEGATIVE NEGATIVE Final    Comment: (NOTE) The Xpert Xpress SARS-CoV-2/FLU/RSV plus assay is intended as an aid in the diagnosis of influenza from Nasopharyngeal swab specimens and should not be used as a sole basis for treatment. Nasal washings and aspirates are unacceptable for Xpert Xpress SARS-CoV-2/FLU/RSV testing.  Fact Sheet for Patients: EntrepreneurPulse.com.au  Fact Sheet for Healthcare Providers: IncredibleEmployment.be  This test is not yet approved or cleared by the [place] FDA and has been authorized for detection and/or diagnosis of SARS-CoV-2 by FDA under an Emergency Use Authorization (EUA). This EUA will remain in effect (meaning this test can be used) for the duration of the COVID-19 declaration under Section 564(b)(1) of the Act, 21 U.S.C. section 360bbb-3(b)(1), unless the authorization is terminated or revoked.  Performed at [facility], [address] [place], [place] [address]   Blood culture (routine x 2)     Status: None   Collection Time: 08/01/21  7:32 PM   Specimen: BLOOD LEFT WRIST  Result Value Ref Range Status   Specimen Description   Final    BLOOD LEFT WRIST Performed at [facility], [address] [place], [place] [address]    Special Requests   Final    Blood Culture adequate volume BOTTLES DRAWN AEROBIC AND ANAEROBIC Performed at [facility], [address] [place], [place] [address]    Culture   Final    NO GROWTH 5 DAYS Performed at [facility], [place] [address] [place], [place] [address]    Report Status 08/06/2021 FINAL  Final  Urine Culture     Status: Abnormal   Collection Time: 08/01/21 11:17 PM   Specimen: Urine, Catheterized  Result Value Ref Range Status   Specimen Description   Final    URINE, CATHETERIZED  Performed at [facility], [place] [address] [place], [place] [address]    Special Requests   Final    Immunocompromised Performed at [facility], [place] [address], [place], [place] [address]    Culture (A)  Final    <10,000 COLONIES/mL INSIGNIFICANT GROWTH Performed at [place] [address], [place], [place] [address]    Report Status 08/03/2021 FINAL  Final  Resp Panel by RT-PCR (Flu A&B, Covid) Nasopharyngeal Swab     Status: None   Collection Time: 08/02/21  2:00 PM   Specimen: Nasopharyngeal Swab; Nasopharyngeal(NP) swabs in vial transport medium  Result Value Ref Range Status   SARS Coronavirus 2 by RT PCR NEGATIVE NEGATIVE Final    Comment: (NOTE) SARS-CoV-2 target nucleic acids are NOT DETECTED.  The SARS-CoV-2 RNA is generally detectable in upper respiratory specimens during the acute phase of infection. The lowest concentration of SARS-CoV-2 viral copies this assay can detect is 138 copies/mL. A negative result does not preclude SARS-Cov-2 infection and should not be used as the sole basis for treatment or other patient management decisions. A negative result may occur with  improper specimen collection/handling, submission of specimen other than nasopharyngeal swab, presence of viral mutation(s) within the areas targeted by this assay, and inadequate number of viral copies(<138 copies/mL). A negative result must be combined with clinical observations, patient history, and epidemiological information. The expected result is Negative.  Fact Sheet for Patients:  EntrepreneurPulse.com.au  Fact Sheet for Healthcare Providers:   IncredibleEmployment.be  This test is no t yet approved or cleared by the [place] FDA and  has been authorized for detection and/or diagnosis of SARS-CoV-2 by FDA under an Emergency Use Authorization (EUA). This EUA will remain  in effect (meaning this test can be used) for the duration of the COVID-19 declaration under Section 564(b)(1) of the Act, 21 U.S.C.section 360bbb-3(b)(1), unless the authorization is terminated  or revoked sooner.       Influenza A by PCR NEGATIVE NEGATIVE Final   Influenza B by PCR NEGATIVE NEGATIVE Final    Comment: (NOTE) The Xpert Xpress SARS-CoV-2/FLU/RSV plus assay is intended as an aid in the diagnosis of influenza from Nasopharyngeal swab specimens and should not be used as a sole basis for treatment. Nasal washings and aspirates are unacceptable for Xpert Xpress SARS-CoV-2/FLU/RSV testing.  Fact Sheet for Patients: EntrepreneurPulse.com.au  Fact Sheet for Healthcare Providers: IncredibleEmployment.be  This test is not yet approved or cleared by the [place] FDA and has been authorized for detection and/or diagnosis of SARS-CoV-2 by FDA under an Emergency Use Authorization (EUA). This EUA will remain in effect (meaning this test can be used) for the duration of the COVID-19 declaration under Section 564(b)(1) of the Act, 21 U.S.C. section 360bbb-3(b)(1), unless the authorization is terminated or revoked.  Performed at [facility], [place] [address], [place], [place] [address]   Gastrointestinal Panel by PCR , Stool     Status: None   Collection Time: 08/02/21  3:49 PM   Specimen: Stool  Result Value Ref Range Status   Campylobacter species NOT DETECTED NOT DETECTED Final   Plesimonas shigelloides NOT DETECTED NOT DETECTED Final   Salmonella species NOT DETECTED NOT DETECTED Final   Yersinia enterocolitica NOT DETECTED NOT DETECTED Final   Vibrio species  NOT DETECTED NOT DETECTED Final   Vibrio cholerae NOT DETECTED NOT DETECTED Final   Enteroaggregative E coli (EAEC) NOT DETECTED NOT DETECTED Final   Enteropathogenic  E coli (EPEC) NOT DETECTED NOT DETECTED Final   Enterotoxigenic E coli (ETEC) NOT DETECTED NOT DETECTED Final   Shiga like toxin producing E coli (STEC) NOT DETECTED NOT DETECTED Final   Shigella/Enteroinvasive E coli (EIEC) NOT DETECTED NOT DETECTED Final   Cryptosporidium NOT DETECTED NOT DETECTED Final   Cyclospora cayetanensis NOT DETECTED NOT DETECTED Final   Entamoeba histolytica NOT DETECTED NOT DETECTED Final   Giardia lamblia NOT DETECTED NOT DETECTED Final   Adenovirus F40/41 NOT DETECTED NOT DETECTED Final   Astrovirus NOT DETECTED NOT DETECTED Final   Norovirus GI/GII NOT DETECTED NOT DETECTED Final   Rotavirus A NOT DETECTED NOT DETECTED Final   Sapovirus (I, II, IV, and V) NOT DETECTED NOT DETECTED Final    Comment: Performed at [facility], [address] [place], [place] [address]  C Difficile Quick Screen (NO PCR Reflex)     Status: None   Collection Time: 08/04/21  9:29 AM   Specimen: STOOL  Result Value Ref Range Status   C Diff antigen NEGATIVE NEGATIVE Final   C Diff toxin NEGATIVE NEGATIVE Final   C Diff interpretation No C. difficile detected.  Final    Comment: Performed at [facility], [place] [address] [place], [place] [address]     Labs: BNP (last 3 results) No results for input(s): BNP in the last 8760 hours. Basic Metabolic Panel: Recent Labs  Lab 08/01/21 2300 08/02/21 0823 08/03/21 0342 08/04/21 0337 08/05/21 0334 08/06/21 0433 08/07/21 0348  NA  --  134* 138 139 142 140 142  K  --  4.1 3.6 3.6 3.2* 3.3* 3.7  CL  --  105 110 110 111 107 108  CO2  --  22 20* '22 23 26 30  '$ GLUCOSE  --  150* 127* 96 83 106* 105*  BUN  --  27* 30* '20 15 13 11  '$ CREATININE  --  0.64 0.78 0.67 0.59 0.70 0.60  CALCIUM  --  7.6* 7.4* 8.1* 7.8* 8.2* 8.7*  MG 2.1 2.3 2.7*  2.5* 2.0 1.8 2.0  PHOS 1.9* 2.4* 1.3* 1.9* 2.5  --   --    Liver Function Tests: Recent Labs  Lab 08/03/21 0342 08/04/21 0337 08/05/21 0334 08/06/21 0433 08/07/21 0348  AST 34 42* 42* 39 37  ALT 33 '31 29 28 29  '$ ALKPHOS 50 81 101 91 97  BILITOT 2.1* 1.2 1.0 1.0 0.7  PROT 5.5* 5.8* 5.6* 5.2* 5.4*  ALBUMIN 2.6* 2.7* 2.7* 2.5* 2.6*   Recent Labs  Lab 08/01/21 1600  LIPASE 28   No results for input(s): AMMONIA in the last 168 hours. CBC: Recent Labs  Lab 08/03/21 0342 08/04/21 0337 08/05/21 0334 08/06/21 0433 08/07/21 0348  WBC 3.4* 17.8* 19.2* 16.9* 11.5*  NEUTROABS 1.3* 12.5* 11.9* 13.4* 7.0  HGB 12.7 12.9 12.2 11.3* 11.2*  HCT 37.6 39.2 35.5* 34.2* 34.5*  MCV 91.5 91.2 90.1 91.0 92.0  PLT 106* 137* 120* 97* 94*   Cardiac Enzymes: Recent Labs  Lab 08/01/21 2300  CKTOTAL 32*   BNP: Invalid input(s): POCBNP CBG: No results for input(s): GLUCAP in the last 168 hours. D-Dimer No results for input(s): DDIMER in the last 72 hours. Hgb A1c No results for input(s): HGBA1C in the last 72 hours. Lipid Profile No results for input(s): CHOL, HDL, LDLCALC, TRIG, CHOLHDL, LDLDIRECT in the last 72 hours. Thyroid function studies No results for input(s): TSH, T4TOTAL, T3FREE, THYROIDAB in the last 72 hours.  Invalid input(s):  FREET3 Anemia work up No results for input(s): VITAMINB12, FOLATE, FERRITIN, TIBC, IRON, RETICCTPCT in the last 72 hours. Urinalysis    Component Value Date/Time   COLORURINE YELLOW 08/01/2021 1339   APPEARANCEUR CLEAR 08/01/2021 1339   LABSPEC 1.025 08/01/2021 1339   PHURINE 5.5 08/01/2021 1339   GLUCOSEU NEGATIVE 08/01/2021 1339   GLUCOSEU NEGATIVE 03/21/2019 1042   HGBUR NEGATIVE 08/01/2021 1339   BILIRUBINUR SMALL (A) 08/01/2021 1339   BILIRUBINUR negative 02/19/2019 1104   KETONESUR NEGATIVE 08/01/2021 1339   PROTEINUR NEGATIVE 08/01/2021 1339   UROBILINOGEN 0.2 03/21/2019 1042   NITRITE POSITIVE (A) 08/01/2021 1339   LEUKOCYTESUR  NEGATIVE 08/01/2021 1339   Sepsis Labs Invalid input(s): PROCALCITONIN,  WBC,  LACTICIDVEN Microbiology Recent Results (from the past 240 hour(s))  Resp Panel by RT-PCR (Flu A&B, Covid) Nasopharyngeal Swab     Status: Abnormal   Collection Time: 08/01/21  7:26 PM   Specimen: Nasopharyngeal Swab; Nasopharyngeal(NP) swabs in vial transport medium  Result Value Ref Range Status   SARS Coronavirus 2 by RT PCR POSITIVE (A) NEGATIVE Final    Comment: (NOTE) SARS-CoV-2 target nucleic acids are DETECTED.  The SARS-CoV-2 RNA is generally detectable in upper respiratory specimens during the acute phase of infection. Positive results are indicative of the presence of the identified virus, but do not rule out bacterial infection or co-infection with other pathogens not detected by the test. Clinical correlation with patient history and other diagnostic information is necessary to determine patient infection status. The expected result is Negative.  Fact Sheet for Patients: EntrepreneurPulse.com.au  Fact Sheet for Healthcare Providers: IncredibleEmployment.be  This test is not yet approved or cleared by the [place] FDA and  has been authorized for detection and/or diagnosis of SARS-CoV-2 by FDA under an Emergency Use Authorization (EUA).  This EUA will remain in effect (meaning this test can be used) for the duration of  the COVID-19 declaration under Section 564(b)(1) of the A ct, 21 U.S.C. section 360bbb-3(b)(1), unless the authorization is terminated or revoked sooner.     Influenza A by PCR NEGATIVE NEGATIVE Final   Influenza B by PCR NEGATIVE NEGATIVE Final    Comment: (NOTE) The Xpert Xpress SARS-CoV-2/FLU/RSV plus assay is intended as an aid in the diagnosis of influenza from Nasopharyngeal swab specimens and should not be used as a sole basis for treatment. Nasal washings and aspirates are unacceptable for Xpert Xpress  SARS-CoV-2/FLU/RSV testing.  Fact Sheet for Patients: EntrepreneurPulse.com.au  Fact Sheet for Healthcare Providers: IncredibleEmployment.be  This test is not yet approved or cleared by the [place] FDA and has been authorized for detection and/or diagnosis of SARS-CoV-2 by FDA under an Emergency Use Authorization (EUA). This EUA will remain in effect (meaning this test can be used) for the duration of the COVID-19 declaration under Section 564(b)(1) of the Act, 21 U.S.C. section 360bbb-3(b)(1), unless the authorization is terminated or revoked.  Performed at [facility], [address] [place], [place] [address]   Blood culture (routine x 2)     Status: None   Collection Time: 08/01/21  7:32 PM   Specimen: BLOOD LEFT WRIST  Result Value Ref Range Status   Specimen Description   Final    BLOOD LEFT WRIST Performed at [facility], [address] [place], [place] [address]    Special Requests   Final    Blood Culture adequate volume BOTTLES DRAWN AEROBIC AND ANAEROBIC Performed at [facility], [address] [place], [place] [address]    Culture   Final    NO GROWTH 5 DAYS Performed at [facility], [place] [address], [place], [place] [address]    Report Status 08/06/2021 FINAL  Final  Urine Culture     Status: Abnormal   Collection Time: 08/01/21 11:17 PM   Specimen: Urine, Catheterized  Result Value Ref Range Status   Specimen Description   Final    URINE, CATHETERIZED Performed at [place] [address] [place], [place] [address]    Special Requests   Final    Immunocompromised Performed at [facility], [place] [address] [place], [place] [address]    Culture (A)  Final    <10,000 COLONIES/mL INSIGNIFICANT GROWTH Performed at [place] [address] [place], [place] [address]    Report Status 08/03/2021 FINAL  Final  Resp Panel by  RT-PCR (Flu A&B, Covid) Nasopharyngeal Swab     Status: None   Collection Time: 08/02/21  2:00 PM   Specimen: Nasopharyngeal Swab; Nasopharyngeal(NP) swabs in vial transport medium  Result Value Ref Range Status   SARS Coronavirus 2 by RT PCR NEGATIVE NEGATIVE Final    Comment: (NOTE) SARS-CoV-2 target nucleic acids are NOT DETECTED.  The SARS-CoV-2 RNA is generally detectable in upper respiratory specimens during the acute phase of infection. The lowest concentration of SARS-CoV-2 viral copies this assay can detect is 138 copies/mL. A negative result does not preclude SARS-Cov-2 infection and should not be used as the sole basis for treatment or other patient management decisions. A negative result may occur with  improper specimen collection/handling, submission of specimen other than nasopharyngeal swab, presence of viral mutation(s) within the areas targeted by this assay, and inadequate number of viral copies(<138 copies/mL). A negative result must be combined with clinical observations, patient history, and epidemiological information. The expected result is Negative.  Fact Sheet for Patients:  EntrepreneurPulse.com.au  Fact Sheet for Healthcare Providers:  IncredibleEmployment.be  This test is no t yet approved or cleared by the [place] FDA and  has been authorized for detection and/or diagnosis of SARS-CoV-2 by FDA under an Emergency Use Authorization (EUA). This EUA will remain  in effect (meaning this test can be used) for the duration of the COVID-19 declaration under Section 564(b)(1) of the Act, 21 U.S.C.section 360bbb-3(b)(1), unless the authorization is terminated  or revoked sooner.       Influenza A by PCR NEGATIVE NEGATIVE Final   Influenza B by PCR NEGATIVE NEGATIVE Final    Comment: (NOTE) The Xpert Xpress SARS-CoV-2/FLU/RSV plus assay is intended as an aid in the diagnosis of influenza from Nasopharyngeal swab  specimens and should not be used as a sole basis for treatment. Nasal washings and aspirates are unacceptable for Xpert Xpress SARS-CoV-2/FLU/RSV testing.  Fact Sheet for Patients: EntrepreneurPulse.com.au  Fact Sheet for Healthcare Providers: IncredibleEmployment.be  This test is not yet approved or cleared by the [place] FDA and has been authorized for detection and/or diagnosis of SARS-CoV-2 by FDA under an Emergency Use Authorization (EUA). This EUA will remain in effect (meaning this test can be used) for the duration of the COVID-19 declaration under Section 564(b)(1) of the Act, 21 U.S.C. section 360bbb-3(b)(1), unless the authorization is terminated or revoked.  Performed at [facility], [place] [address] [place],  [address]   Gastrointestinal Panel by PCR , Stool     Status: None   Collection Time: 08/02/21  3:49 PM   Specimen: Stool  Result Value Ref Range Status   Campylobacter species NOT DETECTED NOT DETECTED Final   Plesimonas shigelloides NOT DETECTED NOT DETECTED Final   Salmonella species NOT DETECTED NOT DETECTED Final   Yersinia enterocolitica NOT DETECTED NOT DETECTED Final   Vibrio species NOT DETECTED NOT DETECTED Final   Vibrio cholerae NOT DETECTED NOT DETECTED Final   Enteroaggregative E coli (EAEC) NOT DETECTED NOT DETECTED Final   Enteropathogenic E coli (EPEC) NOT DETECTED NOT DETECTED Final   Enterotoxigenic E coli (ETEC) NOT DETECTED NOT DETECTED Final   Shiga like toxin producing E coli (STEC) NOT DETECTED NOT DETECTED Final   Shigella/Enteroinvasive E coli (EIEC) NOT DETECTED NOT DETECTED Final   Cryptosporidium NOT DETECTED NOT DETECTED Final   Cyclospora cayetanensis NOT DETECTED NOT DETECTED Final   Entamoeba histolytica NOT DETECTED NOT DETECTED Final   Giardia lamblia NOT DETECTED NOT DETECTED Final   Adenovirus F40/41 NOT DETECTED NOT DETECTED Final   Astrovirus NOT  DETECTED NOT DETECTED Final   Norovirus GI/GII NOT DETECTED NOT DETECTED Final   Rotavirus A NOT DETECTED NOT DETECTED Final   Sapovirus (I, II, IV, and V) NOT DETECTED NOT DETECTED Final    Comment: Performed at [facility], [address] [place], [place] [address]  C Difficile Quick Screen (NO PCR Reflex)     Status: None   Collection Time: 08/04/21  9:29 AM   Specimen: STOOL  Result Value Ref Range Status   C Diff antigen NEGATIVE NEGATIVE Final   C Diff toxin NEGATIVE NEGATIVE Final   C Diff interpretation No C. difficile detected.  Final    Comment: Performed at [facility], [place] [address], [place], [place] [address]     Time coordinating discharge: 35 minutes  SIGNED:   Aline August, MD  [facility] 08/07/2021, 11:00 AM

## 2021-08-07 NOTE — TOC CM/SW Note (Signed)
  Transition of Care [facility]) Screening Note   Patient Details  Name: Christie Thomas Date of Birth: May 02, 1955   Transition of Care [facility]) CM/SW Contact:    Ross Ludwig, LCSW Phone Number: 08/07/2021, 12:48 PM    Transition of Care Department [facility]) has reviewed patient and no TOC needs have been identified at this time. We will continue to monitor patient advancement through interdisciplinary progression rounds. If new patient transition needs arise, please place a TOC consult.

## 2021-08-13 ENCOUNTER — Other Ambulatory Visit: Payer: Self-pay

## 2021-08-13 ENCOUNTER — Telehealth: Payer: Self-pay | Admitting: *Deleted

## 2021-08-13 ENCOUNTER — Telehealth: Payer: Self-pay

## 2021-08-13 ENCOUNTER — Inpatient Hospital Stay: Payer: BC Managed Care – PPO | Admitting: Family Medicine

## 2021-08-13 DIAGNOSIS — E785 Hyperlipidemia, unspecified: Secondary | ICD-10-CM

## 2021-08-13 MED ORDER — FENOFIBRATE 160 MG PO TABS: ORAL_TABLET | ORAL | 0 refills | Status: AC

## 2021-08-13 NOTE — Telephone Encounter (Signed)
Pt called stating she is still having intermittent diarrhea and is fatigued and wanted to know if she should hold off on tx next week.  Called patient back and informed her Dr.Ennever wants to hold tx and will get her scheduled for the week of the 5th. She inquired if she could restart her probiotics and states she is still having a little bit of a rash, abx are finished. States she is using benadryl cream and benadyrl tabs as needed but doesn't seem to be doing much. Inquired if she was still taking her pepcid, she states she stopped that as well. Informed her she can take that for her itching as well. Dr.Ennever okayed restarting probiotics and for pt to take '20mg'$  of pepcid BID until it clears up. Pt aware and declines any other questions or concerns at this time.

## 2021-08-13 NOTE — Telephone Encounter (Signed)
Per 08/13/21 los - gave upcoming appointments

## 2021-08-17 ENCOUNTER — Inpatient Hospital Stay: Payer: BC Managed Care – PPO

## 2021-08-17 ENCOUNTER — Inpatient Hospital Stay: Payer: BC Managed Care – PPO | Admitting: Hematology & Oncology

## 2021-08-17 ENCOUNTER — Encounter: Payer: Self-pay | Admitting: *Deleted

## 2021-08-17 NOTE — Progress Notes (Signed)
Patient hospitalized after cycle one with diverticulitis. She is still having some symptoms so cycle two pushed back one week.   Oncology Nurse Navigator Documentation     08/17/2021   10:15 AM  Oncology Nurse Navigator Flowsheets  Navigator Follow Up Date: 08/24/2021  Navigator Follow Up Reason: Follow-up Appointment;Chemotherapy  Navigator Location [facility]  Navigator Encounter Type Appt/Treatment Plan Review  Patient Visit Type MedOnc  Treatment Phase Active Tx  Barriers/Navigation Needs Coordination of Care;Education  Interventions None Required  Acuity Level 2-Minimal Needs (1-2 Barriers Identified)  Support Groups/Services Friends and Family  Time Spent with Patient 15

## 2021-08-18 ENCOUNTER — Encounter: Payer: Self-pay | Admitting: Family Medicine

## 2021-08-18 ENCOUNTER — Ambulatory Visit: Payer: BC Managed Care – PPO | Admitting: Family Medicine

## 2021-08-18 VITALS — BP 153/53 | HR 80 | Temp 98.1°F | Ht 64.0 in | Wt 185.4 lb

## 2021-08-18 DIAGNOSIS — B370 Candidal stomatitis: Secondary | ICD-10-CM

## 2021-08-18 DIAGNOSIS — D701 Agranulocytosis secondary to cancer chemotherapy: Secondary | ICD-10-CM

## 2021-08-18 DIAGNOSIS — K5792 Diverticulitis of intestine, part unspecified, without perforation or abscess without bleeding: Secondary | ICD-10-CM

## 2021-08-18 DIAGNOSIS — T451X5A Adverse effect of antineoplastic and immunosuppressive drugs, initial encounter: Secondary | ICD-10-CM

## 2021-08-18 LAB — CBC WITH DIFFERENTIAL/PLATELET
Basophils Absolute: 0.1 10*3/uL (ref 0.0–0.1)
Basophils Relative: 2.5 % (ref 0.0–3.0)
Eosinophils Absolute: 0.2 10*3/uL (ref 0.0–0.7)
Eosinophils Relative: 3.6 % (ref 0.0–5.0)
HCT: 37.5 % (ref 36.0–46.0)
Hemoglobin: 12.5 g/dL (ref 12.0–15.0)
Lymphocytes Relative: 29.9 % (ref 12.0–46.0)
Lymphs Abs: 1.7 10*3/uL (ref 0.7–4.0)
MCHC: 33.4 g/dL (ref 30.0–36.0)
MCV: 90.1 fl (ref 78.0–100.0)
Monocytes Absolute: 0.7 10*3/uL (ref 0.1–1.0)
Monocytes Relative: 12.3 % — ABNORMAL HIGH (ref 3.0–12.0)
Neutro Abs: 2.9 10*3/uL (ref 1.4–7.7)
Neutrophils Relative %: 51.7 % (ref 43.0–77.0)
Platelets: 313 10*3/uL (ref 150.0–400.0)
RBC: 4.16 Mil/uL (ref 3.87–5.11)
RDW: 14.6 % (ref 11.5–15.5)
WBC: 5.6 10*3/uL (ref 4.0–10.5)

## 2021-08-18 LAB — BASIC METABOLIC PANEL
BUN: 16 mg/dL (ref 6–23)
CO2: 25 mEq/L (ref 19–32)
Calcium: 9.7 mg/dL (ref 8.4–10.5)
Chloride: 107 mEq/L (ref 96–112)
Creatinine, Ser: 0.77 mg/dL (ref 0.40–1.20)
GFR: 80.33 mL/min (ref >60.00)
Glucose, Bld: 89 mg/dL (ref 70–99)
Potassium: 4.1 mEq/L (ref 3.5–5.1)
Sodium: 140 mEq/L (ref 135–145)

## 2021-08-18 LAB — HEPATIC FUNCTION PANEL
ALT: 34 U/L (ref 0–35)
AST: 40 U/L — ABNORMAL HIGH (ref 0–37)
Albumin: 3.9 g/dL (ref 3.5–5.2)
Alkaline Phosphatase: 81 U/L (ref 39–117)
Bilirubin, Direct: 0.2 mg/dL (ref 0.0–0.3)
Total Bilirubin: 0.7 mg/dL (ref 0.2–1.2)
Total Protein: 6.6 g/dL (ref 6.0–8.3)

## 2021-08-18 NOTE — Assessment & Plan Note (Signed)
Resolving.  Pt has completed her course of Augmentin.  She is advancing her diet and stools are becoming more formed.  She still has mild abd tenderness but she feels that this is residual pain from all the recent inflammation/infection.  Check CBC to assess WBC- although she was neutropenic from chemo.  Reviewed supportive care and red flags that should prompt return.  Pt expressed understanding and is in agreement w/ plan.

## 2021-08-18 NOTE — Assessment & Plan Note (Signed)
New.  Due to chemo.  This complicated her recent hospital course and made her diverticulitis, UTI, and subsequent sepsis more difficult to treat.  Oncology is aware.  Will recheck CBC today.

## 2021-08-18 NOTE — Assessment & Plan Note (Signed)
Pt has completed her course of Diflucan.  Currently asymptomatic.  Will follow due to ongoing chemo

## 2021-08-18 NOTE — Progress Notes (Signed)
   Subjective:    Patient ID: Christie Thomas, female    DOB: 01-Mar-1956, 66 y.o.   MRN: [id number]  [facility] f/u- pt was admitted 5/14-5/20 w/ UTI and acute diverticulitis.  She was also found to be neutropenic.  Her abx were switched from Cefepime and Flagyl to Meropenem due to lip swelling/numbness/epigastric pain.  Stool cxs were negative and diarrhea improved.  She was d/c'd on 3 additional days of Augmentin.  She developed oral/esophageal thrush and was treated w/ IV Diflucan.  She was d/c'd on Diflucan '100mg'$  daily x5 additional days.  Pt feels she now has an allergy to Latex as she has multiple burn like rashes where tubing came in contact w/ her.  Pt reports diarrhea has improved.  Stool is firming up.  Food continues to go 'right through me'.  Drinking a lot of liquids.  Advancing diet slowly.  Energy is improving.  Continues to have some abd tenderness.  No bleeding since she has left the hospital.   Review of Systems For ROS see HPI     Objective:   Physical Exam Vitals reviewed.  Constitutional:      General: She is not in acute distress.    Appearance: Normal appearance. She is well-developed. She is not ill-appearing.  HENT:     Head: Normocephalic and atraumatic.     Comments: Has lost her hair 2/2 chemo Eyes:     Conjunctiva/sclera: Conjunctivae normal.     Pupils: Pupils are equal, round, and reactive to light.  Neck:     Thyroid: No thyromegaly.  Cardiovascular:     Rate and Rhythm: Normal rate and regular rhythm.     Heart sounds: Normal heart sounds. No murmur heard. Pulmonary:     Effort: Pulmonary effort is normal. No respiratory distress.     Breath sounds: Normal breath sounds.  Abdominal:     General: There is no distension.     Palpations: Abdomen is soft.     Tenderness: There is abdominal tenderness (very mild TTP over L side). There is no guarding or rebound.  Musculoskeletal:     Cervical back: Normal range of motion and neck supple.      Right lower leg: No edema.     Left lower leg: No edema.  Lymphadenopathy:     Cervical: No cervical adenopathy.  Skin:    General: Skin is warm and dry.  Neurological:     General: No focal deficit present.     Mental Status: She is alert and oriented to person, place, and time.  Psychiatric:        Mood and Affect: Mood normal.        Behavior: Behavior normal.          Assessment & Plan:

## 2021-08-18 NOTE — Patient Instructions (Signed)
Follow up as needed or as scheduled We'll notify you of your lab results and make any changes if needed Continue to drink a lot of fluids and advance your diet as you are able to tolerate Call with any questions or concerns Hang in there!  You are amazing!!!

## 2021-08-19 ENCOUNTER — Ambulatory Visit: Payer: BC Managed Care – PPO

## 2021-08-23 ENCOUNTER — Ambulatory Visit: Payer: BC Managed Care – PPO | Attending: General Surgery

## 2021-08-23 VITALS — Wt 186.0 lb

## 2021-08-23 DIAGNOSIS — Z483 Aftercare following surgery for neoplasm: Secondary | ICD-10-CM | POA: Insufficient documentation

## 2021-08-23 NOTE — Therapy (Signed)
OUTPATIENT PHYSICAL THERAPY [facility] SCREENING NOTE   Patient Name: Christie Thomas MRN: [id number] DOB:10-15-1955, 66 y.o., female Today's Date: 08/23/2021  PCP: Midge Minium, MD REFERRING PROVIDER: Jovita Kussmaul, MD   PT End of Session - 08/23/21 [phone]     Visit Number 2   # unchanged due to screen only   PT Start Time 0956    PT Stop Time 1001    PT Time Calculation (min) 5 min    Activity Tolerance Patient tolerated treatment well    Behavior During Therapy [facility] for tasks assessed/performed             Past Medical History:  Diagnosis Date   Anxiety 01/20/2016   Asthma    Cancer ([place]) 02/09/2021   right breast   Diverticulitis 01/20/2016   Diverticulosis 01/20/2016   Epilepsy ([place]) 01/20/2016   Adolescent    Fatty liver 01/20/2016   GERD (gastroesophageal reflux disease)    Hay fever    Hyperlipidemia    IBS (irritable bowel syndrome) 01/20/2016   Obesity 01/20/2016   OSA (obstructive sleep apnea) 09/07/2017   Osteoarthritis of both hands 01/20/2016   PONV (postoperative nausea and vomiting)    Swelling of ankle joint, left 01/20/2016   Trochanteric bursitis 01/20/2016   Past Surgical History:  Procedure Laterality Date   AUGMENTATION MAMMAPLASTY     BACK SURGERY     x2   BREAST BIOPSY     fatty tumor   BREAST ENHANCEMENT SURGERY     BREAST LUMPECTOMY WITH RADIOACTIVE SEED AND SENTINEL LYMPH NODE BIOPSY Right 05/17/2021   Procedure: RIGHT BREAST LUMPECTOMY WITH RADIOACTIVE SEED;  Surgeon: Jovita Kussmaul, MD;  Location: [facility];  Service: General;  Laterality: Right;   BREAST RECONSTRUCTION Bilateral 05/17/2021   Procedure: BREAST RECONSTRUCTION REMOVAL IMPLANTS AND REPLACEMENT OF IMPLANTS;  Surgeon: Wallace Going, DO;  Location: [place];  Service: Plastics;  Laterality: Bilateral;   BREAST REDUCTION SURGERY Bilateral 05/17/2021   Procedure: ONCOPLASTIC BREAST REDUCTION;  Surgeon: Wallace Going, DO;  Location: [facility];  Service: Plastics;   Laterality: Bilateral;   CARDIAC CATHETERIZATION  07/2010   CARPAL TUNNEL RELEASE     CATARACT EXTRACTION Bilateral    2023   CESAREAN SECTION     CHOLECYSTECTOMY     HAND SURGERY     carpal tunnel release, cyst excision   PORTACATH PLACEMENT N/A 07/26/2021   Procedure: INSERTION PORT-A-CATH;  Surgeon: Jovita Kussmaul, MD;  Location: [facility];  Service: General;  Laterality: N/A;   SENTINEL NODE BIOPSY Right 05/17/2021   Procedure: RIGHT SENTINEL LYMPH NODE BIOPSY WITH MAGTRACE INJECTION;  Surgeon: Jovita Kussmaul, MD;  Location: [facility];  Service: General;  Laterality: Right;   SHOULDER SURGERY     right   TONSILLECTOMY AND ADENOIDECTOMY     Patient Active Problem List   Diagnosis Date Noted   Esophageal thrush ([place]) 08/03/2021   Oral thrush 08/03/2021   Neutropenia ([place]) 08/01/2021   Sepsis secondary to UTI ([place]) 08/01/2021   UTI (urinary tract infection) 08/01/2021   Sepsis ([place]) 08/01/2021   COVID-19 virus infection 08/01/2021   Diverticulitis 08/01/2021   Breast cancer of upper-inner quadrant of right female breast ([place]) 03/05/2021   Rheumatoid arthritis of multiple sites with negative rheumatoid factor ([place]) 11/17/2016   High risk medication use 11/17/2016   Elevated LFTs/ Dr Collene Mares follows  11/17/2016   Anosmia 08/02/2016   High risk medications (not anticoagulants) long-term use 06/15/2016   ANA  positive 01/21/2016   Swelling of ankle joint, left 01/20/2016   Osteoarthritis of both hands 01/20/2016   GERD (gastroesophageal reflux disease) 01/20/2016   Diverticulosis 01/20/2016   Fatty liver 01/20/2016   IBS (irritable bowel syndrome) 01/20/2016   Obesity 01/20/2016   History of epilepsy 01/20/2016   Cystitis 11/01/2014   Routine general medical examination at a health care facility 05/29/2012   Caregiver stress 01/12/2011   Hyperlipidemia 08/10/2010   Gastroparesis 08/10/2010   Asthma 08/10/2010   Allergic rhinitis 08/10/2010    REFERRING DIAG: right breast cancer at  risk for lymphedema  THERAPY DIAG:  Aftercare following surgery for neoplasm  PERTINENT HISTORY: Pt was diagnosed with right breast CA by screening mammogram. Biopsy revealed Invasive lobular carcinoma ER+, PR-, Her 2 - with proliferation marker of 1%. pt has breast implants. Pt is now s/p Right lumpectomy with SLNB on 05/17/2021 with removal of ruptured implants  and replacement.   Had a prior right shoulder fx 15 yrs ago and motion has been limited since then   PRECAUTIONS: right UE Lymphedema risk, None  SUBJECTIVE: Pt returns for her 3 month L-Dex screen.   PAIN:  Are you having pain? No  SOZO SCREENING: Patient was assessed today using the SOZO machine to determine the lymphedema index score. This was compared to her baseline score. It was determined that she is within the recommended range when compared to her baseline and no further action is needed at this time. She will continue SOZO screenings. These are done every 3 months for 2 years post operatively followed by every 6 months for 2 years, and then annually.    Otelia Limes, PTA 08/23/2021, 10:01 AM

## 2021-08-24 ENCOUNTER — Inpatient Hospital Stay: Payer: BC Managed Care – PPO | Attending: Hematology & Oncology

## 2021-08-24 ENCOUNTER — Inpatient Hospital Stay: Payer: BC Managed Care – PPO

## 2021-08-24 ENCOUNTER — Encounter: Payer: Self-pay | Admitting: *Deleted

## 2021-08-24 ENCOUNTER — Inpatient Hospital Stay: Payer: BC Managed Care – PPO | Admitting: Hematology & Oncology

## 2021-08-24 ENCOUNTER — Encounter: Payer: Self-pay | Admitting: Hematology & Oncology

## 2021-08-24 ENCOUNTER — Other Ambulatory Visit: Payer: Self-pay | Admitting: *Deleted

## 2021-08-24 VITALS — BP 130/55 | HR 80 | Temp 98.0°F | Resp 20 | Wt 187.1 lb

## 2021-08-24 DIAGNOSIS — Z5111 Encounter for antineoplastic chemotherapy: Secondary | ICD-10-CM | POA: Insufficient documentation

## 2021-08-24 DIAGNOSIS — C50211 Malignant neoplasm of upper-inner quadrant of right female breast: Secondary | ICD-10-CM

## 2021-08-24 DIAGNOSIS — Z170 Estrogen receptor positive status [ER+]: Secondary | ICD-10-CM | POA: Insufficient documentation

## 2021-08-24 DIAGNOSIS — D701 Agranulocytosis secondary to cancer chemotherapy: Secondary | ICD-10-CM | POA: Insufficient documentation

## 2021-08-24 LAB — CMP (CANCER CENTER ONLY)
ALT: 33 U/L (ref 0–44)
AST: 42 U/L — ABNORMAL HIGH (ref 15–41)
Albumin: 3.9 g/dL (ref 3.5–5.0)
Alkaline Phosphatase: 70 U/L (ref 38–126)
Anion gap: 7 (ref 5–15)
BUN: 16 mg/dL (ref 8–23)
CO2: 25 mmol/L (ref 22–32)
Calcium: 9.5 mg/dL (ref 8.9–10.3)
Chloride: 110 mmol/L (ref 98–111)
Creatinine: 0.88 mg/dL (ref 0.44–1.00)
GFR, Estimated: >60 mL/min (ref >60)
Glucose, Bld: 107 mg/dL — ABNORMAL HIGH (ref 70–99)
Potassium: 3.9 mmol/L (ref 3.5–5.1)
Sodium: 142 mmol/L (ref 135–145)
Total Bilirubin: 0.7 mg/dL (ref 0.3–1.2)
Total Protein: 6.4 g/dL — ABNORMAL LOW (ref 6.5–8.1)

## 2021-08-24 LAB — CBC WITH DIFFERENTIAL (CANCER CENTER ONLY)
Abs Immature Granulocytes: 0.02 10*3/uL (ref 0.00–0.07)
Basophils Absolute: 0.1 10*3/uL (ref 0.0–0.1)
Basophils Relative: 2 %
Eosinophils Absolute: 0.5 10*3/uL (ref 0.0–0.5)
Eosinophils Relative: 10 %
HCT: 37.8 % (ref 36.0–46.0)
Hemoglobin: 12.4 g/dL (ref 12.0–15.0)
Immature Granulocytes: 0 %
Lymphocytes Relative: 32 %
Lymphs Abs: 1.7 10*3/uL (ref 0.7–4.0)
MCH: 30.5 pg (ref 26.0–34.0)
MCHC: 32.8 g/dL (ref 30.0–36.0)
MCV: 92.9 fL (ref 80.0–100.0)
Monocytes Absolute: 0.7 10*3/uL (ref 0.1–1.0)
Monocytes Relative: 14 %
Neutro Abs: 2.1 10*3/uL (ref 1.7–7.7)
Neutrophils Relative %: 42 %
Platelet Count: 234 10*3/uL (ref 150–400)
RBC: 4.07 MIL/uL (ref 3.87–5.11)
RDW: 14.3 % (ref 11.5–15.5)
WBC Count: 5.2 10*3/uL (ref 4.0–10.5)
nRBC: 0 % (ref 0.0–0.2)

## 2021-08-24 LAB — LACTATE DEHYDROGENASE: LDH: 231 U/L — ABNORMAL HIGH (ref 98–192)

## 2021-08-24 MED ORDER — HEPARIN SOD (PORK) LOCK FLUSH 100 UNIT/ML IV SOLN
500.0000 [IU] | Freq: Once | INTRAVENOUS | Status: AC | PRN
  Administered 2021-08-24: 500 [IU]

## 2021-08-24 MED ORDER — PALONOSETRON HCL INJECTION 0.25 MG/5ML
0.2500 mg | Freq: Once | INTRAVENOUS | Status: AC
  Administered 2021-08-24: 0.25 mg via INTRAVENOUS
  Filled 2021-08-24: qty 5

## 2021-08-24 MED ORDER — SODIUM CHLORIDE 0.9 % IV SOLN: Freq: Once | INTRAVENOUS | Status: AC

## 2021-08-24 MED ORDER — SODIUM CHLORIDE 0.9 % IV SOLN
10.0000 mg | Freq: Once | INTRAVENOUS | Status: AC
  Administered 2021-08-24: 10 mg via INTRAVENOUS
  Filled 2021-08-24: qty 10

## 2021-08-24 MED ORDER — SODIUM CHLORIDE 0.9 % IV SOLN
60.0000 mg/m2 | Freq: Once | INTRAVENOUS | Status: AC
  Administered 2021-08-24: 120 mg via INTRAVENOUS
  Filled 2021-08-24: qty 12

## 2021-08-24 MED ORDER — FLUCONAZOLE 100 MG PO TABS: 100.0000 mg | ORAL_TABLET | Freq: Every day | ORAL | 3 refills | Status: DC

## 2021-08-24 MED ORDER — SULFAMETHOXAZOLE-TRIMETHOPRIM 800-160 MG PO TABS: 1.0000 | ORAL_TABLET | Freq: Every day | ORAL | 2 refills | Status: DC

## 2021-08-24 MED ORDER — SODIUM CHLORIDE 0.9 % IV SOLN
600.0000 mg/m2 | Freq: Once | INTRAVENOUS | Status: AC
  Administered 2021-08-24: 1200 mg via INTRAVENOUS
  Filled 2021-08-24: qty 17

## 2021-08-24 MED ORDER — FAMOTIDINE 40 MG PO TABS
40.0000 mg | ORAL_TABLET | Freq: Two times a day (BID) | ORAL | 3 refills | Status: AC
Start: 2021-08-24 — End: ?

## 2021-08-24 MED ORDER — SODIUM CHLORIDE 0.9% FLUSH
10.0000 mL | INTRAVENOUS | Status: DC | PRN
  Administered 2021-08-24: 10 mL

## 2021-08-24 NOTE — Progress Notes (Signed)
Visited with patient in infusion. She had a difficult experience after her first cycle. She was admitted for 7 days with diverticulitis after cycle one. She states she is feeling better, but still not at baseline. She was due to get cycle two last week but felt that he needed an additional week to feel stronger. Dr Marin Olp has made adjustments to her cycle.   Oncology Nurse Navigator Documentation     08/24/2021    9:30 AM  Oncology Nurse Navigator Flowsheets  Navigator Follow Up Date: 09/14/2021  Navigator Follow Up Reason: Follow-up Appointment;Chemotherapy  Navigator Location [facility]  Navigator Encounter Type Treatment;Appt/Treatment Plan Review  Patient Visit Type MedOnc  Treatment Phase Active Tx  Barriers/Navigation Needs Coordination of Care;Education  Interventions Psycho-Social Support  Acuity Level 2-Minimal Needs (1-2 Barriers Identified)  Support Groups/Services Friends and Family  Time Spent with Patient 15

## 2021-08-24 NOTE — Progress Notes (Signed)
Hematology and Oncology Follow Up Visit  Christie Thomas [id number] October 22, 1955 66 y.o. 08/24/2021   Principle Diagnosis:  Stage Ib ([id number]) infiltrating lobular carcinoma of the right breast -- ER+/PR-/HER2- --Oncotype score = 28  Current Therapy:   status post lumpectomy on 05/17/2021 Taxotere/Cytoxan -- s/p cyle #1/4  -- start on 07/27/2021     Interim History:  Christie Thomas is back for follow-up.  She really had a tough time with the first cycle of chemotherapy.  She was in the hospital because of neutropenia.  She really had a hard time.  She had diarrhea.  Thankfully, all of her cultures were negative.  She is really had a tough time.  She did get Neulasta.  She feels okay right now.  She is active.  She does walk.  She does do gardening.  Her appetite has been okay.  She try to watch what she eats.  She is trying to take all natural and organic products.  She has had no rashes.  She does have a latex allergy and you can see where tape was on him when she was in the hospital.  She has had no fever.  There is been no bleeding.  Overall, I would say performance status for now is ECOG 1.  Medications:  Current Outpatient Medications:    acetaminophen (TYLENOL) 500 MG tablet, Take 500 mg by mouth every 6 (six) hours as needed for moderate pain., Disp: , Rfl:    clonazePAM (KLONOPIN) 0.5 MG tablet, Take 0.5 tablets (0.25 mg total) by mouth at bedtime., Disp: , Rfl:    famotidine (PEPCID) 40 MG tablet, Take 1 tablet (40 mg total) by mouth 2 (two) times daily., Disp: 30 tablet, Rfl: 0   fenofibrate 160 MG tablet, TAKE 1 TABLET ONCE DAILY WITH FOOD., Disp: 90 tablet, Rfl: 0   fluticasone (FLONASE) 50 MCG/ACT nasal spray, Place 1-2 sprays into both nostrils daily as needed for allergies., Disp: , Rfl:    lidocaine-prilocaine (EMLA) cream, Apply 1 application. topically daily as needed. Apply to affected area once, Disp: , Rfl:    ondansetron (ZOFRAN) 8 MG tablet, Take 1  tablet (8 mg total) by mouth 2 (two) times daily as needed for refractory nausea / vomiting. Start on day 3 after chemo., Disp: 30 tablet, Rfl: 1   prochlorperazine (COMPAZINE) 10 MG tablet, Take 1 tablet (10 mg total) by mouth every 6 (six) hours as needed (Nausea or vomiting). (Patient taking differently: Take 10 mg by mouth every 6 (six) hours as needed for vomiting or nausea (Nausea or vomiting).), Disp: 30 tablet, Rfl: 1   Propylene Glycol (SYSTANE COMPLETE OP), Place 1 drop into both eyes daily as needed (dry eyes)., Disp: , Rfl:    rosuvastatin (CRESTOR) 10 MG tablet, Take 1 tablet (10 mg total) by mouth at bedtime., Disp: , Rfl:    traMADol (ULTRAM) 50 MG tablet, Take 50 mg by mouth every 6 (six) hours as needed for severe pain., Disp: , Rfl:    dexamethasone (DECADRON) 4 MG tablet, Take 2 tablets (8 mg total) by mouth 2 (two) times daily. Start the day before Taxotere. Then again the day after chemo for 3 days. (Patient not taking: Reported on 08/24/2021), Disp: 30 tablet, Rfl: 1   naproxen sodium (ALEVE) 220 MG tablet, Take 220 mg by mouth daily as needed (pain). (Patient not taking: Reported on 08/24/2021), Disp: , Rfl:   Allergies:  Allergies  Allergen Reactions   Iodine Anaphylaxis   Penicillins Anaphylaxis  Throat swelling    Shellfish Allergy Anaphylaxis   Cefepime Swelling    Lips swelling and numbness; chest tightness   Cephalexin     Other reaction(s): Unknown   Ciprofloxacin     Other reaction(s): Unknown   Codeine Other (See Comments)    headache   Metoclopramide Other (See Comments)   Latex Rash    Skin appears to be burned    Past Medical History, Surgical history, Social history, and Family History were reviewed and updated.  Review of Systems: Review of Systems  Constitutional: Negative.   HENT:  Negative.    Eyes: Negative.   Respiratory: Negative.    Cardiovascular: Negative.   Gastrointestinal: Negative.   Endocrine: Negative.   Genitourinary:  Negative.    Musculoskeletal: Negative.   Skin: Negative.   Neurological: Negative.   Hematological: Negative.   Psychiatric/Behavioral: Negative.     Physical Exam:  weight is 187 lb 1.3 oz (84.9 kg). Her tympanic temperature is 98 F (36.7 C). Her blood pressure is 130/55 (abnormal) and her pulse is 80. Her respiration is 20 and oxygen saturation is 99%.   Wt Readings from Last 3 Encounters:  08/24/21 187 lb 1.3 oz (84.9 kg)  08/23/21 186 lb (84.4 kg)  08/18/21 185 lb 6.4 oz (84.1 kg)    Physical Exam Vitals reviewed.  Constitutional:      Comments: Breast exam shows surgical scar with the left breast related to some augmentation.  There is no left axillary adenopathy.  The right breast also has the surgical scar.  This is a healing.  There is a healing right lymphadenectomy scar.  There is no erythema or warmth of either breast.  HENT:     Head: Normocephalic and atraumatic.  Eyes:     Pupils: Pupils are equal, round, and reactive to light.  Cardiovascular:     Rate and Rhythm: Normal rate and regular rhythm.     Heart sounds: Normal heart sounds.  Pulmonary:     Effort: Pulmonary effort is normal.     Breath sounds: Normal breath sounds.  Abdominal:     General: Bowel sounds are normal.     Palpations: Abdomen is soft.  Musculoskeletal:        General: No tenderness or deformity. Normal range of motion.     Cervical back: Normal range of motion.  Lymphadenopathy:     Cervical: No cervical adenopathy.  Skin:    General: Skin is warm and dry.     Findings: No erythema or rash.  Neurological:     Mental Status: She is alert and oriented to person, place, and time.  Psychiatric:        Behavior: Behavior normal.        Thought Content: Thought content normal.        Judgment: Judgment normal.     Lab Results  Component Value Date   WBC 5.2 08/24/2021   HGB 12.4 08/24/2021   HCT 37.8 08/24/2021   MCV 92.9 08/24/2021   PLT 234 08/24/2021     Chemistry       Component Value Date/Time   NA 140 08/18/2021 1139   NA 142 11/18/2015 0000   K 4.1 08/18/2021 1139   CL 107 08/18/2021 1139   CO2 25 08/18/2021 1139   BUN 16 08/18/2021 1139   BUN 17 11/18/2015 0000   CREATININE 0.77 08/18/2021 1139   CREATININE 0.96 07/27/2021 0901   CREATININE 1.03 (H) 06/21/2016 1011   GLU 102  11/18/2015 0000      Component Value Date/Time   CALCIUM 9.7 08/18/2021 1139   ALKPHOS 81 08/18/2021 1139   AST 40 (H) 08/18/2021 1139   AST 38 07/27/2021 0901   ALT 34 08/18/2021 1139   ALT 51 (H) 07/27/2021 0901   BILITOT 0.7 08/18/2021 1139   BILITOT 0.6 07/27/2021 0901      Impression and Plan: Christie Thomas is a very charming 66 year old postmenopausal white female.  She has a stage Ib lobular carcinoma of the right breast.  She clearly has aggressive cancer because of the high Oncotype score.  I will have to make a dosage adjustment with the Taxotere.  We will have to dose adjust this down.  We will try to get her on antibiotics after chemotherapy.  We will try to put her on some Bactrim.  She has quite a few allergies.  We will plan to get her back in 3 weeks for her third cycle of treatment.  After she completes her chemotherapy, she will need radiation therapy.  She will also need antiestrogen therapy.   Volanda Napoleon, MD 6/6/20238:49 AM

## 2021-08-24 NOTE — Patient Instructions (Signed)
Cyclophosphamide Injection What is this medication? CYCLOPHOSPHAMIDE (sye kloe FOSS fa mide) is a chemotherapy drug. It slows the growth of cancer cells. This medicine is used to treat many types of cancer like lymphoma, myeloma, leukemia, breast cancer, and ovarian cancer, to name a few. This medicine may be used for other purposes; ask your health care provider or pharmacist if you have questions. COMMON BRAND NAME(S): Cyclophosphamide, Cytoxan, Neosar What should I tell my care team before I take this medication? They need to know if you have any of these conditions: heart disease history of irregular heartbeat infection kidney disease liver disease low blood counts, like white cells, platelets, or red blood cells on hemodialysis recent or ongoing radiation therapy scarring or thickening of the lungs trouble passing urine an unusual or allergic reaction to cyclophosphamide, other medicines, foods, dyes, or preservatives pregnant or trying to get pregnant breast-feeding How should I use this medication? This drug is usually given as an injection into a vein or muscle or by infusion into a vein. It is administered in a hospital or clinic by a specially trained health care professional. Talk to your pediatrician regarding the use of this medicine in children. Special care may be needed. Overdosage: If you think you have taken too much of this medicine contact a poison control center or emergency room at once. NOTE: This medicine is only for you. Do not share this medicine with others. What if I miss a dose? It is important not to miss your dose. Call your doctor or health care professional if you are unable to keep an appointment. What may interact with this medication? amphotericin B azathioprine certain antivirals for HIV or hepatitis certain medicines for blood pressure, heart disease, irregular heart beat certain medicines that treat or prevent blood clots like warfarin certain  other medicines for cancer cyclosporine etanercept indomethacin medicines that relax muscles for surgery medicines to increase blood counts metronidazole This list may not describe all possible interactions. Give your health care provider a list of all the medicines, herbs, non-prescription drugs, or dietary supplements you use. Also tell them if you smoke, drink alcohol, or use illegal drugs. Some items may interact with your medicine. What should I watch for while using this medication? Your condition will be monitored carefully while you are receiving this medicine. You may need blood work done while you are taking this medicine. Drink water or other fluids as directed. Urinate often, even at night. Some products may contain alcohol. Ask your health care professional if this medicine contains alcohol. Be sure to tell all health care professionals you are taking this medicine. Certain medicines, like metronidazole and disulfiram, can cause an unpleasant reaction when taken with alcohol. The reaction includes flushing, headache, nausea, vomiting, sweating, and increased thirst. The reaction can last from 30 minutes to several hours. Do not become pregnant while taking this medicine or for 1 year after stopping it. Women should inform their health care professional if they wish to become pregnant or think they might be pregnant. Men should not father a child while taking this medicine and for 4 months after stopping it. There is potential for serious side effects to an unborn child. Talk to your health care professional for more information. Do not breast-feed an infant while taking this medicine or for 1 week after stopping it. This medicine has caused ovarian failure in some women. This medicine may make it more difficult to get pregnant. Talk to your health care professional if you are   concerned about your fertility. This medicine has caused decreased sperm counts in some men. This may make it  more difficult to father a child. Talk to your health care professional if you are concerned about your fertility. Call your health care professional for advice if you get a fever, chills, or sore throat, or other symptoms of a cold or flu. Do not treat yourself. This medicine decreases your body's ability to fight infections. Try to avoid being around people who are sick. Avoid taking medicines that contain aspirin, acetaminophen, ibuprofen, naproxen, or ketoprofen unless instructed by your health care professional. These medicines may hide a fever. Talk to your health care professional about your risk of cancer. You may be more at risk for certain types of cancer if you take this medicine. If you are going to need surgery or other procedure, tell your health care professional that you are using this medicine. Be careful brushing or flossing your teeth or using a toothpick because you may get an infection or bleed more easily. If you have any dental work done, tell your dentist you are receiving this medicine. What side effects may I notice from receiving this medication? Side effects that you should report to your doctor or health care professional as soon as possible: allergic reactions like skin rash, itching or hives, swelling of the face, lips, or tongue breathing problems nausea, vomiting signs and symptoms of bleeding such as bloody or black, tarry stools; red or dark brown urine; spitting up blood or brown material that looks like coffee grounds; red spots on the skin; unusual bruising or bleeding from the eyes, gums, or nose signs and symptoms of heart failure like fast, irregular heartbeat, sudden weight gain; swelling of the ankles, feet, hands signs and symptoms of infection like fever; chills; cough; sore throat; pain or trouble passing urine signs and symptoms of kidney injury like trouble passing urine or change in the amount of urine signs and symptoms of liver injury like dark yellow  or brown urine; general ill feeling or flu-like symptoms; light-colored stools; loss of appetite; nausea; right upper belly pain; unusually weak or tired; yellowing of the eyes or skin Side effects that usually do not require medical attention (report to your doctor or health care professional if they continue or are bothersome): confusion decreased hearing diarrhea facial flushing hair loss headache loss of appetite missed menstrual periods signs and symptoms of low red blood cells or anemia such as unusually weak or tired; feeling faint or lightheaded; falls skin discoloration This list may not describe all possible side effects. Call your doctor for medical advice about side effects. You may report side effects to FDA at 1-800-FDA-1088. Where should I keep my medication? This drug is given in a hospital or clinic and will not be stored at home. NOTE: This sheet is a summary. It may not cover all possible information. If you have questions about this medicine, talk to your doctor, pharmacist, or health care provider.  2023 Elsevier/Gold Standard (2021-02-05 00:00:00) Docetaxel injection What is this medication? DOCETAXEL (doe se TAX el) is a chemotherapy drug. It targets fast dividing cells, like cancer cells, and causes these cells to die. This medicine is used to treat many types of cancers like breast cancer, certain stomach cancers, head and neck cancer, lung cancer, and prostate cancer. This medicine may be used for other purposes; ask your health care provider or pharmacist if you have questions. COMMON BRAND NAME(S): Docefrez, Taxotere What should I tell my  care team before I take this medication? They need to know if you have any of these conditions: infection (especially a virus infection such as chickenpox, cold sores, or herpes) liver disease low blood counts, like low white cell, platelet, or red cell counts an unusual or allergic reaction to docetaxel, polysorbate 80, other  chemotherapy agents, other medicines, foods, dyes, or preservatives pregnant or trying to get pregnant breast-feeding How should I use this medication? This drug is given as an infusion into a vein. It is administered in a hospital or clinic by a specially trained health care professional. Talk to your pediatrician regarding the use of this medicine in children. Special care may be needed. Overdosage: If you think you have taken too much of this medicine contact a poison control center or emergency room at once. NOTE: This medicine is only for you. Do not share this medicine with others. What if I miss a dose? It is important not to miss your dose. Call your doctor or health care professional if you are unable to keep an appointment. What may interact with this medication? Do not take this medicine with any of the following medications: live virus vaccines This medicine may also interact with the following medications: aprepitant certain antibiotics like erythromycin or clarithromycin certain antivirals for HIV or hepatitis certain medicines for fungal infections like fluconazole, itraconazole, ketoconazole, posaconazole, or voriconazole cimetidine ciprofloxacin conivaptan cyclosporine dronedarone fluvoxamine grapefruit juice imatinib verapamil This list may not describe all possible interactions. Give your health care provider a list of all the medicines, herbs, non-prescription drugs, or dietary supplements you use. Also tell them if you smoke, drink alcohol, or use illegal drugs. Some items may interact with your medicine. What should I watch for while using this medication? Your condition will be monitored carefully while you are receiving this medicine. You will need important blood work done while you are taking this medicine. Call your doctor or health care professional for advice if you get a fever, chills or sore throat, or other symptoms of a cold or flu. Do not treat  yourself. This drug decreases your body's ability to fight infections. Try to avoid being around people who are sick. Some products may contain alcohol. Ask your health care professional if this medicine contains alcohol. Be sure to tell all health care professionals you are taking this medicine. Certain medicines, like metronidazole and disulfiram, can cause an unpleasant reaction when taken with alcohol. The reaction includes flushing, headache, nausea, vomiting, sweating, and increased thirst. The reaction can last from 30 minutes to several hours. You may get drowsy or dizzy. Do not drive, use machinery, or do anything that needs mental alertness until you know how this medicine affects you. Do not stand or sit up quickly, especially if you are an older patient. This reduces the risk of dizzy or fainting spells. Alcohol may interfere with the effect of this medicine. Talk to your health care professional about your risk of cancer. You may be more at risk for certain types of cancer if you take this medicine. Do not become pregnant while taking this medicine or for 6 months after stopping it. Women should inform their doctor if they wish to become pregnant or think they might be pregnant. There is a potential for serious side effects to an unborn child. Talk to your health care professional or pharmacist for more information. Do not breast-feed an infant while taking this medicine or for 1 week after stopping it. Males who get this  medicine must use a condom during sex with females who can get pregnant. If you get a woman pregnant, the baby could have birth defects. The baby could die before they are born. You will need to continue wearing a condom for 3 months after stopping the medicine. Tell your health care provider right away if your partner becomes pregnant while you are taking this medicine. This may interfere with the ability to father a child. You should talk to your doctor or health care  professional if you are concerned about your fertility. What side effects may I notice from receiving this medication? Side effects that you should report to your doctor or health care professional as soon as possible: allergic reactions like skin rash, itching or hives, swelling of the face, lips, or tongue blurred vision breathing problems changes in vision low blood counts - This drug may decrease the number of white blood cells, red blood cells and platelets. You may be at increased risk for infections and bleeding. nausea and vomiting pain, redness or irritation at site where injected pain, tingling, numbness in the hands or feet redness, blistering, peeling, or loosening of the skin, including inside the mouth signs of decreased platelets or bleeding - bruising, pinpoint red spots on the skin, black, tarry stools, nosebleeds signs of decreased red blood cells - unusually weak or tired, fainting spells, lightheadedness signs of infection - fever or chills, cough, sore throat, pain or difficulty passing urine swelling of the ankle, feet, hands Side effects that usually do not require medical attention (report to your doctor or health care professional if they continue or are bothersome): constipation diarrhea fingernail or toenail changes hair loss loss of appetite mouth sores muscle pain This list may not describe all possible side effects. Call your doctor for medical advice about side effects. You may report side effects to FDA at 1-800-FDA-1088. Where should I keep my medication? This drug is given in a hospital or clinic and will not be stored at home. NOTE: This sheet is a summary. It may not cover all possible information. If you have questions about this medicine, talk to your doctor, pharmacist, or health care provider.  2023 Elsevier/Gold Standard (2021-02-05 00:00:00)

## 2021-08-26 ENCOUNTER — Encounter: Payer: Self-pay | Admitting: Hematology & Oncology

## 2021-08-26 ENCOUNTER — Inpatient Hospital Stay: Payer: BC Managed Care – PPO

## 2021-08-26 VITALS — BP 138/62 | HR 79 | Temp 98.2°F | Resp 18

## 2021-08-26 DIAGNOSIS — D701 Agranulocytosis secondary to cancer chemotherapy: Secondary | ICD-10-CM | POA: Diagnosis not present

## 2021-08-26 DIAGNOSIS — C50211 Malignant neoplasm of upper-inner quadrant of right female breast: Secondary | ICD-10-CM | POA: Diagnosis not present

## 2021-08-26 DIAGNOSIS — Z170 Estrogen receptor positive status [ER+]: Secondary | ICD-10-CM | POA: Diagnosis not present

## 2021-08-26 DIAGNOSIS — Z5111 Encounter for antineoplastic chemotherapy: Secondary | ICD-10-CM | POA: Diagnosis not present

## 2021-08-26 MED ORDER — PEGFILGRASTIM-CBQV 6 MG/0.6ML ~~LOC~~ SOSY
6.0000 mg | PREFILLED_SYRINGE | Freq: Once | SUBCUTANEOUS | Status: AC
  Administered 2021-08-26: 6 mg via SUBCUTANEOUS
  Filled 2021-08-26: qty 0.6

## 2021-08-26 NOTE — Patient Instructions (Signed)

## 2021-08-30 ENCOUNTER — Other Ambulatory Visit: Payer: Self-pay | Admitting: *Deleted

## 2021-08-30 ENCOUNTER — Telehealth: Payer: Self-pay | Admitting: *Deleted

## 2021-08-30 DIAGNOSIS — R110 Nausea: Secondary | ICD-10-CM

## 2021-08-30 DIAGNOSIS — N3000 Acute cystitis without hematuria: Secondary | ICD-10-CM

## 2021-08-30 DIAGNOSIS — C50211 Malignant neoplasm of upper-inner quadrant of right female breast: Secondary | ICD-10-CM

## 2021-08-30 NOTE — Telephone Encounter (Signed)
This nurse called patient to check on her because the on-call answering service told her to go to the ER for pain, nausea, fever, shaky, constipated, dry heaves and has not been able to eat today. She stated,"I didn't go because the pain I have is constipation pain. I slept off and on all night and today. I don't have any pain, nausea or fevers today. I'm weak and trying to drink fluids but I'm exhausted. I need to come in for IVF because that really helps me get back to baseline. Per Dr. Marin Olp, have her come in for labs and IVF. LOS sent to scheduling. She verbalized understanding of appointments.

## 2021-08-31 ENCOUNTER — Ambulatory Visit: Payer: BC Managed Care – PPO

## 2021-08-31 ENCOUNTER — Telehealth: Payer: Self-pay

## 2021-08-31 ENCOUNTER — Inpatient Hospital Stay: Payer: BC Managed Care – PPO

## 2021-08-31 ENCOUNTER — Other Ambulatory Visit: Payer: Self-pay | Admitting: *Deleted

## 2021-08-31 ENCOUNTER — Other Ambulatory Visit: Payer: Self-pay | Admitting: Oncology

## 2021-08-31 VITALS — BP 118/51 | HR 86

## 2021-08-31 DIAGNOSIS — D701 Agranulocytosis secondary to cancer chemotherapy: Secondary | ICD-10-CM | POA: Diagnosis not present

## 2021-08-31 DIAGNOSIS — N3000 Acute cystitis without hematuria: Secondary | ICD-10-CM

## 2021-08-31 DIAGNOSIS — Z5111 Encounter for antineoplastic chemotherapy: Secondary | ICD-10-CM | POA: Diagnosis not present

## 2021-08-31 DIAGNOSIS — Z170 Estrogen receptor positive status [ER+]: Secondary | ICD-10-CM | POA: Diagnosis not present

## 2021-08-31 DIAGNOSIS — C50211 Malignant neoplasm of upper-inner quadrant of right female breast: Secondary | ICD-10-CM

## 2021-08-31 DIAGNOSIS — R110 Nausea: Secondary | ICD-10-CM

## 2021-08-31 LAB — CMP (CANCER CENTER ONLY)
ALT: 31 U/L (ref 0–44)
AST: 39 U/L (ref 15–41)
Albumin: 4.1 g/dL (ref 3.5–5.0)
Alkaline Phosphatase: 93 U/L (ref 38–126)
Anion gap: 9 (ref 5–15)
BUN: 21 mg/dL (ref 8–23)
CO2: 25 mmol/L (ref 22–32)
Calcium: 9.8 mg/dL (ref 8.9–10.3)
Chloride: 102 mmol/L (ref 98–111)
Creatinine: 1.22 mg/dL — ABNORMAL HIGH (ref 0.44–1.00)
GFR, Estimated: 49 mL/min — ABNORMAL LOW (ref >60)
Glucose, Bld: 115 mg/dL — ABNORMAL HIGH (ref 70–99)
Potassium: 3.7 mmol/L (ref 3.5–5.1)
Sodium: 136 mmol/L (ref 135–145)
Total Bilirubin: 0.9 mg/dL (ref 0.3–1.2)
Total Protein: 6.6 g/dL (ref 6.5–8.1)

## 2021-08-31 LAB — CBC WITH DIFFERENTIAL (CANCER CENTER ONLY)
Abs Immature Granulocytes: 3.03 10*3/uL — ABNORMAL HIGH (ref 0.00–0.07)
Basophils Absolute: 0 10*3/uL (ref 0.0–0.1)
Basophils Relative: 0 %
Eosinophils Absolute: 0.1 10*3/uL (ref 0.0–0.5)
Eosinophils Relative: 1 %
HCT: 40.5 % (ref 36.0–46.0)
Hemoglobin: 13.4 g/dL (ref 12.0–15.0)
Immature Granulocytes: 17 %
Lymphocytes Relative: 11 %
Lymphs Abs: 2 10*3/uL (ref 0.7–4.0)
MCH: 29.9 pg (ref 26.0–34.0)
MCHC: 33.1 g/dL (ref 30.0–36.0)
MCV: 90.4 fL (ref 80.0–100.0)
Monocytes Absolute: 3.8 10*3/uL — ABNORMAL HIGH (ref 0.1–1.0)
Monocytes Relative: 22 %
Neutro Abs: 8.8 10*3/uL — ABNORMAL HIGH (ref 1.7–7.7)
Neutrophils Relative %: 49 %
Platelet Count: 188 10*3/uL (ref 150–400)
RBC: 4.48 MIL/uL (ref 3.87–5.11)
RDW: 14.3 % (ref 11.5–15.5)
WBC Count: 17.9 10*3/uL — ABNORMAL HIGH (ref 4.0–10.5)
nRBC: 0.3 % — ABNORMAL HIGH (ref 0.0–0.2)

## 2021-08-31 MED ORDER — FLUCONAZOLE 200 MG PO TABS: 200.0000 mg | ORAL_TABLET | Freq: Every day | ORAL | 2 refills | Status: AC

## 2021-08-31 MED ORDER — OLANZAPINE 10 MG PO TABS: 10.0000 mg | ORAL_TABLET | Freq: Every day | ORAL | 1 refills | Status: AC

## 2021-08-31 MED ORDER — MAGIC MOUTHWASH
10.0000 mL | Freq: Four times a day (QID) | ORAL | 0 refills | Status: AC | PRN
Start: 2021-08-31 — End: ?

## 2021-08-31 MED ORDER — SODIUM CHLORIDE 0.9% FLUSH
10.0000 mL | Freq: Once | INTRAVENOUS | Status: AC
  Administered 2021-08-31: 10 mL via INTRAVENOUS

## 2021-08-31 MED ORDER — ONDANSETRON HCL 4 MG/2ML IJ SOLN
8.0000 mg | Freq: Once | INTRAMUSCULAR | Status: AC
  Administered 2021-08-31: 8 mg via INTRAVENOUS
  Filled 2021-08-31: qty 4

## 2021-08-31 MED ORDER — ONDANSETRON HCL 4 MG/2ML IJ SOLN: 4.0000 mg | Freq: Four times a day (QID) | INTRAMUSCULAR | Status: DC | PRN

## 2021-08-31 MED ORDER — SODIUM CHLORIDE 0.9 % IV SOLN: Freq: Once | INTRAVENOUS | Status: AC

## 2021-08-31 MED ORDER — HEPARIN SOD (PORK) LOCK FLUSH 100 UNIT/ML IV SOLN
500.0000 [IU] | Freq: Once | INTRAVENOUS | Status: AC
  Administered 2021-08-31: 500 [IU] via INTRAVENOUS

## 2021-08-31 MED ORDER — SODIUM CHLORIDE 0.9 % IV SOLN: 8.0000 mg | Freq: Once | INTRAVENOUS | Status: DC

## 2021-08-31 NOTE — Patient Instructions (Signed)

## 2021-08-31 NOTE — Telephone Encounter (Signed)
Call from [facility], they cannot fill magic mouthwash that was sent in as they cant compound drugs. Called pt and requested top send to [facility]. Faxed rx.

## 2021-08-31 NOTE — Patient Instructions (Signed)

## 2021-09-01 ENCOUNTER — Inpatient Hospital Stay: Payer: BC Managed Care – PPO

## 2021-09-01 ENCOUNTER — Other Ambulatory Visit (HOSPITAL_BASED_OUTPATIENT_CLINIC_OR_DEPARTMENT_OTHER): Payer: Self-pay

## 2021-09-01 ENCOUNTER — Encounter: Payer: Self-pay | Admitting: Hematology & Oncology

## 2021-09-01 DIAGNOSIS — C50211 Malignant neoplasm of upper-inner quadrant of right female breast: Secondary | ICD-10-CM | POA: Diagnosis not present

## 2021-09-01 DIAGNOSIS — N3000 Acute cystitis without hematuria: Secondary | ICD-10-CM

## 2021-09-01 DIAGNOSIS — D701 Agranulocytosis secondary to cancer chemotherapy: Secondary | ICD-10-CM | POA: Diagnosis not present

## 2021-09-01 DIAGNOSIS — Z5111 Encounter for antineoplastic chemotherapy: Secondary | ICD-10-CM | POA: Diagnosis not present

## 2021-09-01 DIAGNOSIS — Z170 Estrogen receptor positive status [ER+]: Secondary | ICD-10-CM

## 2021-09-01 DIAGNOSIS — R110 Nausea: Secondary | ICD-10-CM

## 2021-09-01 MED ORDER — HEPARIN SOD (PORK) LOCK FLUSH 100 UNIT/ML IV SOLN
500.0000 [IU] | Freq: Once | INTRAVENOUS | Status: AC
  Administered 2021-09-01: 500 [IU] via INTRAVENOUS

## 2021-09-01 MED ORDER — SODIUM CHLORIDE 0.9% FLUSH
10.0000 mL | Freq: Once | INTRAVENOUS | Status: AC
  Administered 2021-09-01: 10 mL via INTRAVENOUS

## 2021-09-01 MED ORDER — SODIUM CHLORIDE 0.9 % IV SOLN: Freq: Once | INTRAVENOUS | Status: AC

## 2021-09-01 NOTE — Patient Instructions (Signed)

## 2021-09-02 ENCOUNTER — Ambulatory Visit: Payer: BC Managed Care – PPO

## 2021-09-14 ENCOUNTER — Inpatient Hospital Stay: Payer: BC Managed Care – PPO

## 2021-09-14 ENCOUNTER — Encounter: Payer: Self-pay | Admitting: Hematology & Oncology

## 2021-09-14 ENCOUNTER — Inpatient Hospital Stay (HOSPITAL_BASED_OUTPATIENT_CLINIC_OR_DEPARTMENT_OTHER): Payer: BC Managed Care – PPO | Admitting: Hematology & Oncology

## 2021-09-14 ENCOUNTER — Encounter: Payer: Self-pay | Admitting: *Deleted

## 2021-09-14 VITALS — BP 131/54 | HR 105 | Temp 98.1°F | Resp 20 | Wt 180.0 lb

## 2021-09-14 VITALS — HR 90

## 2021-09-14 DIAGNOSIS — R0602 Shortness of breath: Secondary | ICD-10-CM | POA: Diagnosis not present

## 2021-09-14 DIAGNOSIS — E870 Hyperosmolality and hypernatremia: Secondary | ICD-10-CM | POA: Diagnosis not present

## 2021-09-14 DIAGNOSIS — K5780 Diverticulitis of intestine, part unspecified, with perforation and abscess without bleeding: Secondary | ICD-10-CM | POA: Diagnosis not present

## 2021-09-14 DIAGNOSIS — R140 Abdominal distension (gaseous): Secondary | ICD-10-CM | POA: Diagnosis not present

## 2021-09-14 DIAGNOSIS — Z170 Estrogen receptor positive status [ER+]: Secondary | ICD-10-CM | POA: Diagnosis not present

## 2021-09-14 DIAGNOSIS — R942 Abnormal results of pulmonary function studies: Secondary | ICD-10-CM | POA: Diagnosis not present

## 2021-09-14 DIAGNOSIS — B370 Candidal stomatitis: Secondary | ICD-10-CM | POA: Diagnosis not present

## 2021-09-14 DIAGNOSIS — K668 Other specified disorders of peritoneum: Secondary | ICD-10-CM | POA: Diagnosis not present

## 2021-09-14 DIAGNOSIS — J9811 Atelectasis: Secondary | ICD-10-CM | POA: Diagnosis not present

## 2021-09-14 DIAGNOSIS — T451X5A Adverse effect of antineoplastic and immunosuppressive drugs, initial encounter: Secondary | ICD-10-CM | POA: Diagnosis not present

## 2021-09-14 DIAGNOSIS — D849 Immunodeficiency, unspecified: Secondary | ICD-10-CM | POA: Diagnosis not present

## 2021-09-14 DIAGNOSIS — Z515 Encounter for palliative care: Secondary | ICD-10-CM | POA: Diagnosis not present

## 2021-09-14 DIAGNOSIS — G9341 Metabolic encephalopathy: Secondary | ICD-10-CM | POA: Diagnosis not present

## 2021-09-14 DIAGNOSIS — Z4682 Encounter for fitting and adjustment of non-vascular catheter: Secondary | ICD-10-CM | POA: Diagnosis not present

## 2021-09-14 DIAGNOSIS — K7689 Other specified diseases of liver: Secondary | ICD-10-CM | POA: Diagnosis not present

## 2021-09-14 DIAGNOSIS — R3912 Poor urinary stream: Secondary | ICD-10-CM | POA: Diagnosis not present

## 2021-09-14 DIAGNOSIS — K7460 Unspecified cirrhosis of liver: Secondary | ICD-10-CM | POA: Diagnosis present

## 2021-09-14 DIAGNOSIS — Z5111 Encounter for antineoplastic chemotherapy: Secondary | ICD-10-CM | POA: Diagnosis present

## 2021-09-14 DIAGNOSIS — K5289 Other specified noninfective gastroenteritis and colitis: Secondary | ICD-10-CM | POA: Diagnosis not present

## 2021-09-14 DIAGNOSIS — J9601 Acute respiratory failure with hypoxia: Secondary | ICD-10-CM | POA: Diagnosis not present

## 2021-09-14 DIAGNOSIS — R34 Anuria and oliguria: Secondary | ICD-10-CM | POA: Diagnosis not present

## 2021-09-14 DIAGNOSIS — R933 Abnormal findings on diagnostic imaging of other parts of digestive tract: Secondary | ICD-10-CM | POA: Diagnosis not present

## 2021-09-14 DIAGNOSIS — R1084 Generalized abdominal pain: Secondary | ICD-10-CM | POA: Diagnosis not present

## 2021-09-14 DIAGNOSIS — Z9049 Acquired absence of other specified parts of digestive tract: Secondary | ICD-10-CM | POA: Diagnosis not present

## 2021-09-14 DIAGNOSIS — E876 Hypokalemia: Secondary | ICD-10-CM | POA: Diagnosis not present

## 2021-09-14 DIAGNOSIS — D61810 Antineoplastic chemotherapy induced pancytopenia: Secondary | ICD-10-CM | POA: Diagnosis not present

## 2021-09-14 DIAGNOSIS — K7290 Hepatic failure, unspecified without coma: Secondary | ICD-10-CM | POA: Diagnosis not present

## 2021-09-14 DIAGNOSIS — K5792 Diverticulitis of intestine, part unspecified, without perforation or abscess without bleeding: Secondary | ICD-10-CM | POA: Diagnosis not present

## 2021-09-14 DIAGNOSIS — C50211 Malignant neoplasm of upper-inner quadrant of right female breast: Secondary | ICD-10-CM

## 2021-09-14 DIAGNOSIS — E872 Acidosis, unspecified: Secondary | ICD-10-CM | POA: Diagnosis present

## 2021-09-14 DIAGNOSIS — M1990 Unspecified osteoarthritis, unspecified site: Secondary | ICD-10-CM | POA: Diagnosis not present

## 2021-09-14 DIAGNOSIS — K658 Other peritonitis: Secondary | ICD-10-CM | POA: Diagnosis not present

## 2021-09-14 DIAGNOSIS — K5900 Constipation, unspecified: Secondary | ICD-10-CM | POA: Diagnosis not present

## 2021-09-14 DIAGNOSIS — A09 Infectious gastroenteritis and colitis, unspecified: Secondary | ICD-10-CM | POA: Diagnosis not present

## 2021-09-14 DIAGNOSIS — Z9981 Dependence on supplemental oxygen: Secondary | ICD-10-CM | POA: Diagnosis not present

## 2021-09-14 DIAGNOSIS — D701 Agranulocytosis secondary to cancer chemotherapy: Secondary | ICD-10-CM | POA: Diagnosis not present

## 2021-09-14 DIAGNOSIS — Z9221 Personal history of antineoplastic chemotherapy: Secondary | ICD-10-CM | POA: Diagnosis not present

## 2021-09-14 DIAGNOSIS — N179 Acute kidney failure, unspecified: Secondary | ICD-10-CM | POA: Diagnosis not present

## 2021-09-14 DIAGNOSIS — K5901 Slow transit constipation: Secondary | ICD-10-CM | POA: Diagnosis not present

## 2021-09-14 DIAGNOSIS — G40909 Epilepsy, unspecified, not intractable, without status epilepticus: Secondary | ICD-10-CM | POA: Diagnosis not present

## 2021-09-14 DIAGNOSIS — G4730 Sleep apnea, unspecified: Secondary | ICD-10-CM | POA: Diagnosis not present

## 2021-09-14 DIAGNOSIS — Z7901 Long term (current) use of anticoagulants: Secondary | ICD-10-CM | POA: Diagnosis not present

## 2021-09-14 DIAGNOSIS — K5730 Diverticulosis of large intestine without perforation or abscess without bleeding: Secondary | ICD-10-CM | POA: Diagnosis not present

## 2021-09-14 DIAGNOSIS — K631 Perforation of intestine (nontraumatic): Secondary | ICD-10-CM | POA: Diagnosis not present

## 2021-09-14 DIAGNOSIS — J90 Pleural effusion, not elsewhere classified: Secondary | ICD-10-CM | POA: Diagnosis not present

## 2021-09-14 DIAGNOSIS — D709 Neutropenia, unspecified: Secondary | ICD-10-CM | POA: Diagnosis not present

## 2021-09-14 DIAGNOSIS — R000 Tachycardia, unspecified: Secondary | ICD-10-CM | POA: Diagnosis not present

## 2021-09-14 DIAGNOSIS — R0902 Hypoxemia: Secondary | ICD-10-CM | POA: Diagnosis not present

## 2021-09-14 DIAGNOSIS — R109 Unspecified abdominal pain: Secondary | ICD-10-CM | POA: Diagnosis not present

## 2021-09-14 DIAGNOSIS — K56609 Unspecified intestinal obstruction, unspecified as to partial versus complete obstruction: Secondary | ICD-10-CM | POA: Diagnosis not present

## 2021-09-14 DIAGNOSIS — T40601A Poisoning by unspecified narcotics, accidental (unintentional), initial encounter: Secondary | ICD-10-CM | POA: Diagnosis not present

## 2021-09-14 DIAGNOSIS — R6521 Severe sepsis with septic shock: Secondary | ICD-10-CM | POA: Diagnosis not present

## 2021-09-14 DIAGNOSIS — K529 Noninfective gastroenteritis and colitis, unspecified: Secondary | ICD-10-CM | POA: Diagnosis not present

## 2021-09-14 DIAGNOSIS — I3139 Other pericardial effusion (noninflammatory): Secondary | ICD-10-CM | POA: Diagnosis not present

## 2021-09-14 DIAGNOSIS — I959 Hypotension, unspecified: Secondary | ICD-10-CM | POA: Diagnosis not present

## 2021-09-14 DIAGNOSIS — R799 Abnormal finding of blood chemistry, unspecified: Secondary | ICD-10-CM | POA: Diagnosis not present

## 2021-09-14 DIAGNOSIS — F419 Anxiety disorder, unspecified: Secondary | ICD-10-CM | POA: Diagnosis not present

## 2021-09-14 DIAGNOSIS — E8721 Acute metabolic acidosis: Secondary | ICD-10-CM | POA: Diagnosis not present

## 2021-09-14 DIAGNOSIS — K7581 Nonalcoholic steatohepatitis (NASH): Secondary | ICD-10-CM | POA: Diagnosis not present

## 2021-09-14 DIAGNOSIS — K6389 Other specified diseases of intestine: Secondary | ICD-10-CM | POA: Diagnosis not present

## 2021-09-14 DIAGNOSIS — Z66 Do not resuscitate: Secondary | ICD-10-CM | POA: Diagnosis not present

## 2021-09-14 DIAGNOSIS — E8809 Other disorders of plasma-protein metabolism, not elsewhere classified: Secondary | ICD-10-CM | POA: Diagnosis present

## 2021-09-14 DIAGNOSIS — A419 Sepsis, unspecified organism: Secondary | ICD-10-CM | POA: Diagnosis not present

## 2021-09-14 DIAGNOSIS — R4182 Altered mental status, unspecified: Secondary | ICD-10-CM | POA: Diagnosis not present

## 2021-09-14 DIAGNOSIS — N170 Acute kidney failure with tubular necrosis: Secondary | ICD-10-CM | POA: Diagnosis not present

## 2021-09-14 DIAGNOSIS — D630 Anemia in neoplastic disease: Secondary | ICD-10-CM | POA: Diagnosis present

## 2021-09-14 DIAGNOSIS — C50911 Malignant neoplasm of unspecified site of right female breast: Secondary | ICD-10-CM | POA: Diagnosis not present

## 2021-09-14 DIAGNOSIS — D638 Anemia in other chronic diseases classified elsewhere: Secondary | ICD-10-CM | POA: Diagnosis not present

## 2021-09-14 DIAGNOSIS — D72829 Elevated white blood cell count, unspecified: Secondary | ICD-10-CM | POA: Diagnosis not present

## 2021-09-14 DIAGNOSIS — E669 Obesity, unspecified: Secondary | ICD-10-CM | POA: Diagnosis not present

## 2021-09-14 DIAGNOSIS — I9589 Other hypotension: Secondary | ICD-10-CM | POA: Diagnosis not present

## 2021-09-14 DIAGNOSIS — D6959 Other secondary thrombocytopenia: Secondary | ICD-10-CM | POA: Diagnosis not present

## 2021-09-14 LAB — CBC WITH DIFFERENTIAL (CANCER CENTER ONLY)
Abs Immature Granulocytes: 0.04 10*3/uL (ref 0.00–0.07)
Basophils Absolute: 0 10*3/uL (ref 0.0–0.1)
Basophils Relative: 0 %
Eosinophils Absolute: 0 10*3/uL (ref 0.0–0.5)
Eosinophils Relative: 0 %
HCT: 37.5 % (ref 36.0–46.0)
Hemoglobin: 12.3 g/dL (ref 12.0–15.0)
Immature Granulocytes: 0 %
Lymphocytes Relative: 10 %
Lymphs Abs: 1.1 10*3/uL (ref 0.7–4.0)
MCH: 30.7 pg (ref 26.0–34.0)
MCHC: 32.8 g/dL (ref 30.0–36.0)
MCV: 93.5 fL (ref 80.0–100.0)
Monocytes Absolute: 0.4 10*3/uL (ref 0.1–1.0)
Monocytes Relative: 3 %
Neutro Abs: 9.6 10*3/uL — ABNORMAL HIGH (ref 1.7–7.7)
Neutrophils Relative %: 87 %
Platelet Count: 244 10*3/uL (ref 150–400)
RBC: 4.01 MIL/uL (ref 3.87–5.11)
RDW: 15.9 % — ABNORMAL HIGH (ref 11.5–15.5)
WBC Count: 11.1 10*3/uL — ABNORMAL HIGH (ref 4.0–10.5)
nRBC: 0 % (ref 0.0–0.2)

## 2021-09-14 LAB — CMP (CANCER CENTER ONLY)
ALT: 21 U/L (ref 0–44)
AST: 31 U/L (ref 15–41)
Albumin: 4.3 g/dL (ref 3.5–5.0)
Alkaline Phosphatase: 65 U/L (ref 38–126)
Anion gap: 10 (ref 5–15)
BUN: 17 mg/dL (ref 8–23)
CO2: 22 mmol/L (ref 22–32)
Calcium: 10.2 mg/dL (ref 8.9–10.3)
Chloride: 110 mmol/L (ref 98–111)
Creatinine: 0.92 mg/dL (ref 0.44–1.00)
GFR, Estimated: >60 mL/min (ref >60)
Glucose, Bld: 182 mg/dL — ABNORMAL HIGH (ref 70–99)
Potassium: 4.3 mmol/L (ref 3.5–5.1)
Sodium: 142 mmol/L (ref 135–145)
Total Bilirubin: 0.7 mg/dL (ref 0.3–1.2)
Total Protein: 7.1 g/dL (ref 6.5–8.1)

## 2021-09-14 MED ORDER — HEPARIN SOD (PORK) LOCK FLUSH 100 UNIT/ML IV SOLN
500.0000 [IU] | Freq: Once | INTRAVENOUS | Status: AC | PRN
  Administered 2021-09-14: 500 [IU]

## 2021-09-14 MED ORDER — PALONOSETRON HCL INJECTION 0.25 MG/5ML
0.2500 mg | Freq: Once | INTRAVENOUS | Status: AC
  Administered 2021-09-14: 0.25 mg via INTRAVENOUS
  Filled 2021-09-14: qty 5

## 2021-09-14 MED ORDER — SODIUM CHLORIDE 0.9 % IV SOLN
60.0000 mg/m2 | Freq: Once | INTRAVENOUS | Status: AC
  Administered 2021-09-14: 120 mg via INTRAVENOUS
  Filled 2021-09-14: qty 12

## 2021-09-14 MED ORDER — SODIUM CHLORIDE 0.9% FLUSH
10.0000 mL | INTRAVENOUS | Status: DC | PRN
  Administered 2021-09-14: 10 mL

## 2021-09-14 MED ORDER — LACTULOSE 10 GM/15ML PO SOLN: 20.0000 g | Freq: Two times a day (BID) | ORAL | 3 refills | Status: AC | PRN

## 2021-09-14 MED ORDER — SODIUM CHLORIDE 0.9 % IV SOLN
600.0000 mg/m2 | Freq: Once | INTRAVENOUS | Status: AC
  Administered 2021-09-14: 1200 mg via INTRAVENOUS
  Filled 2021-09-14: qty 60

## 2021-09-14 MED ORDER — SODIUM CHLORIDE 0.9 % IV SOLN: Freq: Once | INTRAVENOUS | Status: AC

## 2021-09-14 MED ORDER — SODIUM CHLORIDE 0.9 % IV SOLN
10.0000 mg | Freq: Once | INTRAVENOUS | Status: AC
  Administered 2021-09-14: 10 mg via INTRAVENOUS
  Filled 2021-09-14: qty 10

## 2021-09-15 ENCOUNTER — Inpatient Hospital Stay (HOSPITAL_COMMUNITY)
Admission: EM | Admit: 2021-09-15 | Discharge: 2021-10-19 | DRG: 329 | Disposition: E | Payer: BC Managed Care – PPO | Attending: Student | Admitting: Student

## 2021-09-15 ENCOUNTER — Encounter (HOSPITAL_COMMUNITY): Payer: Self-pay

## 2021-09-15 ENCOUNTER — Other Ambulatory Visit: Payer: Self-pay

## 2021-09-15 ENCOUNTER — Telehealth: Payer: Self-pay | Admitting: *Deleted

## 2021-09-15 ENCOUNTER — Emergency Department (HOSPITAL_COMMUNITY): Payer: BC Managed Care – PPO

## 2021-09-15 DIAGNOSIS — K658 Other peritonitis: Secondary | ICD-10-CM | POA: Diagnosis present

## 2021-09-15 DIAGNOSIS — Z6830 Body mass index (BMI) 30.0-30.9, adult: Secondary | ICD-10-CM

## 2021-09-15 DIAGNOSIS — Z9221 Personal history of antineoplastic chemotherapy: Secondary | ICD-10-CM

## 2021-09-15 DIAGNOSIS — K5900 Constipation, unspecified: Secondary | ICD-10-CM | POA: Diagnosis not present

## 2021-09-15 DIAGNOSIS — D6481 Anemia due to antineoplastic chemotherapy: Secondary | ICD-10-CM | POA: Diagnosis present

## 2021-09-15 DIAGNOSIS — E7800 Pure hypercholesterolemia, unspecified: Secondary | ICD-10-CM | POA: Diagnosis present

## 2021-09-15 DIAGNOSIS — I3139 Other pericardial effusion (noninflammatory): Secondary | ICD-10-CM | POA: Diagnosis not present

## 2021-09-15 DIAGNOSIS — D849 Immunodeficiency, unspecified: Secondary | ICD-10-CM | POA: Diagnosis present

## 2021-09-15 DIAGNOSIS — T451X5A Adverse effect of antineoplastic and immunosuppressive drugs, initial encounter: Secondary | ICD-10-CM | POA: Diagnosis not present

## 2021-09-15 DIAGNOSIS — Z9049 Acquired absence of other specified parts of digestive tract: Secondary | ICD-10-CM

## 2021-09-15 DIAGNOSIS — K5780 Diverticulitis of intestine, part unspecified, with perforation and abscess without bleeding: Secondary | ICD-10-CM | POA: Diagnosis present

## 2021-09-15 DIAGNOSIS — G40909 Epilepsy, unspecified, not intractable, without status epilepticus: Secondary | ICD-10-CM | POA: Diagnosis present

## 2021-09-15 DIAGNOSIS — Z7901 Long term (current) use of anticoagulants: Secondary | ICD-10-CM | POA: Diagnosis not present

## 2021-09-15 DIAGNOSIS — K6389 Other specified diseases of intestine: Secondary | ICD-10-CM | POA: Diagnosis not present

## 2021-09-15 DIAGNOSIS — D61810 Antineoplastic chemotherapy induced pancytopenia: Secondary | ICD-10-CM | POA: Diagnosis present

## 2021-09-15 DIAGNOSIS — E876 Hypokalemia: Secondary | ICD-10-CM | POA: Diagnosis present

## 2021-09-15 DIAGNOSIS — K529 Noninfective gastroenteritis and colitis, unspecified: Secondary | ICD-10-CM

## 2021-09-15 DIAGNOSIS — R1084 Generalized abdominal pain: Secondary | ICD-10-CM | POA: Diagnosis not present

## 2021-09-15 DIAGNOSIS — E8809 Other disorders of plasma-protein metabolism, not elsewhere classified: Secondary | ICD-10-CM | POA: Diagnosis present

## 2021-09-15 DIAGNOSIS — I9589 Other hypotension: Secondary | ICD-10-CM | POA: Diagnosis not present

## 2021-09-15 DIAGNOSIS — K631 Perforation of intestine (nontraumatic): Secondary | ICD-10-CM | POA: Diagnosis not present

## 2021-09-15 DIAGNOSIS — D701 Agranulocytosis secondary to cancer chemotherapy: Secondary | ICD-10-CM | POA: Diagnosis not present

## 2021-09-15 DIAGNOSIS — K5720 Diverticulitis of large intestine with perforation and abscess without bleeding: Principal | ICD-10-CM | POA: Diagnosis present

## 2021-09-15 DIAGNOSIS — J9601 Acute respiratory failure with hypoxia: Secondary | ICD-10-CM | POA: Diagnosis not present

## 2021-09-15 DIAGNOSIS — R110 Nausea: Secondary | ICD-10-CM | POA: Diagnosis present

## 2021-09-15 DIAGNOSIS — F419 Anxiety disorder, unspecified: Secondary | ICD-10-CM | POA: Diagnosis not present

## 2021-09-15 DIAGNOSIS — G9341 Metabolic encephalopathy: Secondary | ICD-10-CM | POA: Diagnosis not present

## 2021-09-15 DIAGNOSIS — R109 Unspecified abdominal pain: Secondary | ICD-10-CM | POA: Diagnosis not present

## 2021-09-15 DIAGNOSIS — E162 Hypoglycemia, unspecified: Secondary | ICD-10-CM | POA: Diagnosis not present

## 2021-09-15 DIAGNOSIS — E872 Acidosis, unspecified: Secondary | ICD-10-CM | POA: Diagnosis present

## 2021-09-15 DIAGNOSIS — E8770 Fluid overload, unspecified: Secondary | ICD-10-CM | POA: Diagnosis not present

## 2021-09-15 DIAGNOSIS — Z83438 Family history of other disorder of lipoprotein metabolism and other lipidemia: Secondary | ICD-10-CM

## 2021-09-15 DIAGNOSIS — R000 Tachycardia, unspecified: Secondary | ICD-10-CM | POA: Diagnosis not present

## 2021-09-15 DIAGNOSIS — K7460 Unspecified cirrhosis of liver: Secondary | ICD-10-CM | POA: Diagnosis present

## 2021-09-15 DIAGNOSIS — T40601A Poisoning by unspecified narcotics, accidental (unintentional), initial encounter: Secondary | ICD-10-CM | POA: Diagnosis not present

## 2021-09-15 DIAGNOSIS — K56609 Unspecified intestinal obstruction, unspecified as to partial versus complete obstruction: Secondary | ICD-10-CM

## 2021-09-15 DIAGNOSIS — R4182 Altered mental status, unspecified: Secondary | ICD-10-CM | POA: Diagnosis not present

## 2021-09-15 DIAGNOSIS — R34 Anuria and oliguria: Secondary | ICD-10-CM | POA: Diagnosis not present

## 2021-09-15 DIAGNOSIS — N170 Acute kidney failure with tubular necrosis: Secondary | ICD-10-CM | POA: Diagnosis not present

## 2021-09-15 DIAGNOSIS — K7290 Hepatic failure, unspecified without coma: Secondary | ICD-10-CM | POA: Diagnosis not present

## 2021-09-15 DIAGNOSIS — Z880 Allergy status to penicillin: Secondary | ICD-10-CM

## 2021-09-15 DIAGNOSIS — Z9981 Dependence on supplemental oxygen: Secondary | ICD-10-CM | POA: Diagnosis not present

## 2021-09-15 DIAGNOSIS — R791 Abnormal coagulation profile: Secondary | ICD-10-CM | POA: Diagnosis not present

## 2021-09-15 DIAGNOSIS — C50211 Malignant neoplasm of upper-inner quadrant of right female breast: Secondary | ICD-10-CM | POA: Diagnosis present

## 2021-09-15 DIAGNOSIS — T40605A Adverse effect of unspecified narcotics, initial encounter: Secondary | ICD-10-CM | POA: Diagnosis present

## 2021-09-15 DIAGNOSIS — D630 Anemia in neoplastic disease: Secondary | ICD-10-CM | POA: Diagnosis present

## 2021-09-15 DIAGNOSIS — Z515 Encounter for palliative care: Secondary | ICD-10-CM

## 2021-09-15 DIAGNOSIS — R942 Abnormal results of pulmonary function studies: Secondary | ICD-10-CM | POA: Diagnosis not present

## 2021-09-15 DIAGNOSIS — M19041 Primary osteoarthritis, right hand: Secondary | ICD-10-CM | POA: Diagnosis present

## 2021-09-15 DIAGNOSIS — Z885 Allergy status to narcotic agent status: Secondary | ICD-10-CM

## 2021-09-15 DIAGNOSIS — C50911 Malignant neoplasm of unspecified site of right female breast: Secondary | ICD-10-CM | POA: Diagnosis not present

## 2021-09-15 DIAGNOSIS — D638 Anemia in other chronic diseases classified elsewhere: Secondary | ICD-10-CM | POA: Diagnosis present

## 2021-09-15 DIAGNOSIS — Z91013 Allergy to seafood: Secondary | ICD-10-CM

## 2021-09-15 DIAGNOSIS — Z66 Do not resuscitate: Secondary | ICD-10-CM | POA: Diagnosis not present

## 2021-09-15 DIAGNOSIS — D709 Neutropenia, unspecified: Secondary | ICD-10-CM | POA: Diagnosis not present

## 2021-09-15 DIAGNOSIS — D72829 Elevated white blood cell count, unspecified: Secondary | ICD-10-CM

## 2021-09-15 DIAGNOSIS — Z881 Allergy status to other antibiotic agents status: Secondary | ICD-10-CM

## 2021-09-15 DIAGNOSIS — Z79891 Long term (current) use of opiate analgesic: Secondary | ICD-10-CM

## 2021-09-15 DIAGNOSIS — K5792 Diverticulitis of intestine, part unspecified, without perforation or abscess without bleeding: Secondary | ICD-10-CM | POA: Diagnosis present

## 2021-09-15 DIAGNOSIS — R6521 Severe sepsis with septic shock: Secondary | ICD-10-CM | POA: Diagnosis not present

## 2021-09-15 DIAGNOSIS — Z79899 Other long term (current) drug therapy: Secondary | ICD-10-CM

## 2021-09-15 DIAGNOSIS — K5901 Slow transit constipation: Secondary | ICD-10-CM | POA: Diagnosis present

## 2021-09-15 DIAGNOSIS — D6959 Other secondary thrombocytopenia: Secondary | ICD-10-CM | POA: Diagnosis not present

## 2021-09-15 DIAGNOSIS — K219 Gastro-esophageal reflux disease without esophagitis: Secondary | ICD-10-CM | POA: Diagnosis present

## 2021-09-15 DIAGNOSIS — R799 Abnormal finding of blood chemistry, unspecified: Secondary | ICD-10-CM | POA: Diagnosis not present

## 2021-09-15 DIAGNOSIS — Z91040 Latex allergy status: Secondary | ICD-10-CM

## 2021-09-15 DIAGNOSIS — E669 Obesity, unspecified: Secondary | ICD-10-CM | POA: Diagnosis present

## 2021-09-15 DIAGNOSIS — K5289 Other specified noninfective gastroenteritis and colitis: Secondary | ICD-10-CM | POA: Diagnosis present

## 2021-09-15 DIAGNOSIS — E870 Hyperosmolality and hypernatremia: Secondary | ICD-10-CM | POA: Diagnosis present

## 2021-09-15 DIAGNOSIS — Z8249 Family history of ischemic heart disease and other diseases of the circulatory system: Secondary | ICD-10-CM

## 2021-09-15 DIAGNOSIS — R0602 Shortness of breath: Secondary | ICD-10-CM | POA: Diagnosis not present

## 2021-09-15 DIAGNOSIS — G4733 Obstructive sleep apnea (adult) (pediatric): Secondary | ICD-10-CM | POA: Diagnosis present

## 2021-09-15 DIAGNOSIS — M19042 Primary osteoarthritis, left hand: Secondary | ICD-10-CM | POA: Diagnosis present

## 2021-09-15 DIAGNOSIS — Z170 Estrogen receptor positive status [ER+]: Secondary | ICD-10-CM | POA: Diagnosis not present

## 2021-09-15 DIAGNOSIS — E8721 Acute metabolic acidosis: Secondary | ICD-10-CM | POA: Diagnosis not present

## 2021-09-15 DIAGNOSIS — B370 Candidal stomatitis: Secondary | ICD-10-CM | POA: Diagnosis not present

## 2021-09-15 DIAGNOSIS — R3912 Poor urinary stream: Secondary | ICD-10-CM | POA: Diagnosis not present

## 2021-09-15 DIAGNOSIS — A419 Sepsis, unspecified organism: Secondary | ICD-10-CM | POA: Diagnosis not present

## 2021-09-15 DIAGNOSIS — Z888 Allergy status to other drugs, medicaments and biological substances status: Secondary | ICD-10-CM

## 2021-09-15 DIAGNOSIS — K7581 Nonalcoholic steatohepatitis (NASH): Secondary | ICD-10-CM | POA: Diagnosis present

## 2021-09-15 LAB — CBC WITH DIFFERENTIAL/PLATELET
Abs Immature Granulocytes: 0.12 10*3/uL — ABNORMAL HIGH (ref 0.00–0.07)
Basophils Absolute: 0 10*3/uL (ref 0.0–0.1)
Basophils Relative: 0 %
Eosinophils Absolute: 0 10*3/uL (ref 0.0–0.5)
Eosinophils Relative: 0 %
HCT: 41.6 % (ref 36.0–46.0)
Hemoglobin: 13.6 g/dL (ref 12.0–15.0)
Immature Granulocytes: 1 %
Lymphocytes Relative: 6 %
Lymphs Abs: 1 10*3/uL (ref 0.7–4.0)
MCH: 30.4 pg (ref 26.0–34.0)
MCHC: 32.7 g/dL (ref 30.0–36.0)
MCV: 92.9 fL (ref 80.0–100.0)
Monocytes Absolute: 1 10*3/uL (ref 0.1–1.0)
Monocytes Relative: 6 %
Neutro Abs: 15.7 10*3/uL — ABNORMAL HIGH (ref 1.7–7.7)
Neutrophils Relative %: 87 %
Platelets: 315 10*3/uL (ref 150–400)
RBC: 4.48 MIL/uL (ref 3.87–5.11)
RDW: 16.3 % — ABNORMAL HIGH (ref 11.5–15.5)
WBC: 17.8 10*3/uL — ABNORMAL HIGH (ref 4.0–10.5)
nRBC: 0 % (ref 0.0–0.2)

## 2021-09-15 LAB — COMPREHENSIVE METABOLIC PANEL
ALT: 25 U/L (ref 0–44)
AST: 34 U/L (ref 15–41)
Albumin: 4.1 g/dL (ref 3.5–5.0)
Alkaline Phosphatase: 56 U/L (ref 38–126)
Anion gap: 10 (ref 5–15)
BUN: 23 mg/dL (ref 8–23)
CO2: 22 mmol/L (ref 22–32)
Calcium: 9.6 mg/dL (ref 8.9–10.3)
Chloride: 113 mmol/L — ABNORMAL HIGH (ref 98–111)
Creatinine, Ser: 1.01 mg/dL — ABNORMAL HIGH (ref 0.44–1.00)
GFR, Estimated: >60 mL/min (ref >60)
Glucose, Bld: 136 mg/dL — ABNORMAL HIGH (ref 70–99)
Potassium: 4 mmol/L (ref 3.5–5.1)
Sodium: 145 mmol/L (ref 135–145)
Total Bilirubin: 0.9 mg/dL (ref 0.3–1.2)
Total Protein: 7.2 g/dL (ref 6.5–8.1)

## 2021-09-15 LAB — URINALYSIS, ROUTINE W REFLEX MICROSCOPIC
Bilirubin Urine: NEGATIVE
Glucose, UA: NEGATIVE mg/dL
Hgb urine dipstick: NEGATIVE
Ketones, ur: NEGATIVE mg/dL
Leukocytes,Ua: NEGATIVE
Nitrite: NEGATIVE
Protein, ur: NEGATIVE mg/dL
Specific Gravity, Urine: 1.024 (ref 1.005–1.030)
pH: 5 (ref 5.0–8.0)

## 2021-09-15 LAB — LIPASE, BLOOD: Lipase: 45 U/L (ref 11–51)

## 2021-09-15 MED ORDER — ENOXAPARIN SODIUM 40 MG/0.4ML IJ SOSY
40.0000 mg | PREFILLED_SYRINGE | Freq: Every day | INTRAMUSCULAR | Status: DC
  Administered 2021-09-15 – 2021-09-21 (×7): 40 mg via SUBCUTANEOUS
  Filled 2021-09-15 (×7): qty 0.4

## 2021-09-15 MED ORDER — PIPERACILLIN-TAZOBACTAM 3.375 G IVPB 30 MIN
3.3750 g | Freq: Four times a day (QID) | INTRAVENOUS | Status: DC
Start: 2021-09-15 — End: 2021-09-15

## 2021-09-15 MED ORDER — SODIUM CHLORIDE 0.9 % IV SOLN
Freq: Once | INTRAVENOUS | Status: AC
Start: 2021-09-15 — End: 2021-09-16

## 2021-09-15 MED ORDER — TRAMADOL HCL 50 MG PO TABS
50.0000 mg | ORAL_TABLET | Freq: Once | ORAL | Status: AC
  Administered 2021-09-15: 50 mg via ORAL
  Filled 2021-09-15: qty 1

## 2021-09-15 MED ORDER — DEXAMETHASONE SODIUM PHOSPHATE 10 MG/ML IJ SOLN
8.0000 mg | Freq: Two times a day (BID) | INTRAMUSCULAR | Status: DC
  Administered 2021-09-15: 8 mg via INTRAVENOUS
  Filled 2021-09-15: qty 1

## 2021-09-15 MED ORDER — SODIUM CHLORIDE 0.9 % IV SOLN
3.0000 g | Freq: Once | INTRAVENOUS | Status: DC
  Filled 2021-09-15: qty 8

## 2021-09-15 MED ORDER — SODIUM CHLORIDE 0.9 % IV BOLUS
1000.0000 mL | Freq: Once | INTRAVENOUS | Status: AC
  Administered 2021-09-15: 1000 mL via INTRAVENOUS

## 2021-09-15 MED ORDER — DIPHENHYDRAMINE HCL 50 MG/ML IJ SOLN
25.0000 mg | Freq: Once | INTRAMUSCULAR | Status: DC
  Filled 2021-09-15: qty 1

## 2021-09-15 MED ORDER — DOCUSATE SODIUM 100 MG PO CAPS
200.0000 mg | ORAL_CAPSULE | Freq: Every day | ORAL | Status: DC
  Administered 2021-09-15 – 2021-09-17 (×3): 200 mg via ORAL
  Filled 2021-09-15 (×3): qty 2

## 2021-09-15 MED ORDER — PIPERACILLIN-TAZOBACTAM 3.375 G IVPB
3.3750 g | Freq: Three times a day (TID) | INTRAVENOUS | Status: DC
Start: 2021-09-15 — End: 2021-09-23
  Administered 2021-09-15 – 2021-09-23 (×23): 3.375 g via INTRAVENOUS
  Filled 2021-09-15 (×26): qty 50

## 2021-09-15 MED ORDER — LACTATED RINGERS IV SOLN: INTRAVENOUS | Status: DC

## 2021-09-15 MED ORDER — CHLORHEXIDINE GLUCONATE CLOTH 2 % EX PADS
6.0000 | MEDICATED_PAD | Freq: Every day | CUTANEOUS | Status: DC
  Administered 2021-09-16 – 2021-09-22 (×6): 6 via TOPICAL

## 2021-09-15 MED ORDER — TRAMADOL HCL 50 MG PO TABS
50.0000 mg | ORAL_TABLET | Freq: Four times a day (QID) | ORAL | Status: DC | PRN
  Administered 2021-09-15 – 2021-09-21 (×15): 50 mg via ORAL
  Filled 2021-09-15 (×15): qty 1

## 2021-09-15 MED ORDER — MORPHINE SULFATE (PF) 4 MG/ML IV SOLN
4.0000 mg | Freq: Once | INTRAVENOUS | Status: DC
  Filled 2021-09-15: qty 1

## 2021-09-15 MED ORDER — ONDANSETRON HCL 4 MG/2ML IJ SOLN
4.0000 mg | Freq: Once | INTRAMUSCULAR | Status: AC
  Administered 2021-09-15: 4 mg via INTRAVENOUS
  Filled 2021-09-15: qty 2

## 2021-09-15 MED ORDER — PROCHLORPERAZINE EDISYLATE 10 MG/2ML IJ SOLN
10.0000 mg | INTRAMUSCULAR | Status: DC | PRN
Start: 2021-09-15 — End: 2021-09-23
  Administered 2021-09-15 – 2021-09-21 (×6): 10 mg via INTRAVENOUS
  Filled 2021-09-15 (×6): qty 2

## 2021-09-15 MED ORDER — FAMOTIDINE IN NACL 20-0.9 MG/50ML-% IV SOLN
20.0000 mg | Freq: Two times a day (BID) | INTRAVENOUS | Status: DC
  Administered 2021-09-15 – 2021-09-16 (×2): 20 mg via INTRAVENOUS
  Filled 2021-09-15 (×2): qty 50

## 2021-09-15 NOTE — Telephone Encounter (Signed)
Message received from patient's husband to notify Dr. Marin Olp that pt is currently in the [facility] ER and is going to be admitted.  Dr. Marin Olp notified.

## 2021-09-15 NOTE — ED Notes (Signed)
Per pt, pt would like to receive meds before providing a UA sample. RN aware. Will re-attempt at a later time.

## 2021-09-15 NOTE — ED Provider Notes (Signed)
[facility] DEPT Provider Note   CSN: [id number] Arrival date & time: 09/08/2021  9242     History  Chief Complaint  Patient presents with   Abdominal Pain    Christie Thomas is a 66 y.o. female with a past medical history of breast cancer, currently receiving chemotherapy, diverticulitis, who presents to the Emergency Department complaining of right-sided abdominal pain onset 4 days.  She notes that she typically has constipation following each chemo treatment.  Last round of chemo was yesterday, patient is followed by Dr. Marin Olp. Has associated nausea, vomiting, constipation (last bowel movement was 4 days ago).  Tried stool softener and enema at home with no relief of her symptoms.  Also took a tramadol last night for her symptoms. Denies fever, urinary symptoms.    The history is provided by the patient. No language interpreter was used.       Home Medications Prior to Admission medications   Medication Sig Start Date End Date Taking? Authorizing Provider  acetaminophen (TYLENOL) 500 MG tablet Take 500 mg by mouth every 6 (six) hours as needed for moderate pain.    [provider]  clonazePAM (KLONOPIN) 0.5 MG tablet Take 0.5 tablets (0.25 mg total) by mouth at bedtime. 08/07/21   Aline August, MD  dexamethasone (DECADRON) 4 MG tablet Take 2 tablets (8 mg total) by mouth 2 (two) times daily. Start the day before Taxotere. Then again the day after chemo for 3 days. 07/16/21   Volanda Napoleon, MD  famotidine (PEPCID) 40 MG tablet Take 1 tablet (40 mg total) by mouth 2 (two) times daily. 08/24/21   Volanda Napoleon, MD  fenofibrate 160 MG tablet TAKE 1 TABLET ONCE DAILY WITH FOOD. 08/13/21   Midge Minium, MD  fluconazole (DIFLUCAN) 200 MG tablet Take 1 tablet (200 mg total) by mouth daily. Patient not taking: Reported on 09/14/2021 08/31/21   Volanda Napoleon, MD  fluticasone [facility]) 50 MCG/ACT nasal spray Place 1-2 sprays into both  nostrils daily as needed for allergies. 08/07/21   Aline August, MD  lactulose (CHRONULAC) 10 GM/15ML solution Take 30 mLs (20 g total) by mouth 2 (two) times daily as needed for mild constipation. 09/14/21   Volanda Napoleon, MD  lidocaine-prilocaine (EMLA) cream Apply 1 application. topically daily as needed. Apply to affected area once 08/07/21   Aline August, MD  magic mouthwash SOLN Take 10 mLs by mouth 4 (four) times daily as needed for mouth pain. Patient not taking: Reported on 09/14/2021 08/31/21   Volanda Napoleon, MD  naproxen sodium (ALEVE) 220 MG tablet Take 220 mg by mouth daily as needed (pain). Patient not taking: Reported on 08/24/2021    [provider]  OLANZapine (ZYPREXA) 10 MG tablet Take 1 tablet (10 mg total) by mouth at bedtime. Patient not taking: Reported on 09/14/2021 08/31/21   Volanda Napoleon, MD  ondansetron (ZOFRAN) 8 MG tablet Take 1 tablet (8 mg total) by mouth 2 (two) times daily as needed for refractory nausea / vomiting. Start on day 3 after chemo. 07/16/21   Volanda Napoleon, MD  OVER THE COUNTER MEDICATION daily. Ultra Flora IB    [provider]  prochlorperazine (COMPAZINE) 10 MG tablet Take 1 tablet (10 mg total) by mouth every 6 (six) hours as needed (Nausea or vomiting). Patient taking differently: Take 10 mg by mouth every 6 (six) hours as needed for vomiting or nausea (Nausea or vomiting). 07/16/21   Burney Gauze  R, MD  Propylene Glycol (SYSTANE COMPLETE OP) Place 1 drop into both eyes daily as needed (dry eyes).    [provider]  rosuvastatin (CRESTOR) 10 MG tablet Take 1 tablet (10 mg total) by mouth at bedtime. 08/07/21   Aline August, MD  traMADol (ULTRAM) 50 MG tablet Take 50 mg by mouth every 6 (six) hours as needed for severe pain.    [provider]      Allergies    Iodine, Penicillins, Shellfish allergy, Cefepime, Cephalexin, Ciprofloxacin, Codeine, Metoclopramide, and Latex    Review of Systems   Review  of Systems  Constitutional:  Negative for fever.  Gastrointestinal:  Positive for abdominal pain, constipation, nausea and vomiting.  Genitourinary:  Negative for dysuria and hematuria.  All other systems reviewed and are negative.   Physical Exam Updated Vital Signs BP (!) 150/87 (BP Location: Left Arm)   Pulse (!) 103   Temp 98.8 F (37.1 C) (Oral)   Resp 15   Ht '5\' 4"'$  (1.626 m)   Wt 81.6 kg   SpO2 98%   BMI 30.88 kg/m  Physical Exam Vitals and nursing note reviewed.  Constitutional:      General: She is not in acute distress.    Appearance: She is not diaphoretic.  HENT:     Head: Normocephalic and atraumatic.     Mouth/Throat:     Pharynx: No oropharyngeal exudate.  Eyes:     General: No scleral icterus.    Conjunctiva/sclera: Conjunctivae normal.  Cardiovascular:     Rate and Rhythm: Normal rate and regular rhythm.     Pulses: Normal pulses.     Heart sounds: Normal heart sounds.  Pulmonary:     Effort: Pulmonary effort is normal. No respiratory distress.     Breath sounds: Normal breath sounds. No wheezing.  Abdominal:     General: Bowel sounds are normal.     Palpations: Abdomen is soft. There is no mass.     Tenderness: There is no abdominal tenderness. There is no guarding or rebound.     Comments: Diffuse abdominal tenderness to palpation, more on left.  Musculoskeletal:        General: Normal range of motion.     Cervical back: Normal range of motion and neck supple.  Skin:    General: Skin is warm and dry.  Neurological:     Mental Status: She is alert.  Psychiatric:        Behavior: Behavior normal.     ED Results / Procedures / Treatments   Labs (all labs ordered are listed, but only abnormal results are displayed) Labs Reviewed  CBC WITH DIFFERENTIAL/PLATELET - Abnormal; Notable for the following components:      Result Value   WBC 17.8 (*)    RDW 16.3 (*)    Neutro Abs 15.7 (*)    Abs Immature Granulocytes 0.12 (*)    All other  components within normal limits  COMPREHENSIVE METABOLIC PANEL - Abnormal; Notable for the following components:   Chloride 113 (*)    Glucose, Bld 136 (*)    Creatinine, Ser 1.01 (*)    All other components within normal limits  C DIFFICILE QUICK SCREEN W PCR REFLEX    LIPASE, BLOOD  URINALYSIS, ROUTINE W REFLEX MICROSCOPIC    EKG None  Radiology CT ABDOMEN PELVIS WO CONTRAST  Result Date: 08/29/2021 CLINICAL DATA:  Abdominal pain with nausea vomiting and diarrhea in a 66 year old female. History of chemotherapy for breast  cancer. * Tracking Code: BO * EXAM: CT ABDOMEN AND PELVIS WITHOUT CONTRAST TECHNIQUE: Multidetector CT imaging of the abdomen and pelvis was performed following the standard protocol without IV contrast. RADIATION DOSE REDUCTION: This exam was performed according to the departmental dose-optimization program which includes automated exposure control, adjustment of the mA and/or kV according to patient size and/or use of iterative reconstruction technique. COMPARISON:  Aug 01, 2021. FINDINGS: Lower chest: Central venous access device terminates in the caval to atrial junction. Mild basilar atelectasis. Bilateral breast implants. No pericardial effusion. Hepatobiliary: Liver displays a cirrhotic morphology with fissural widening and LEFT lobe hypertrophy. No visible lesion on noncontrast imaging. Post cholecystectomy without gross biliary duct distension. Pancreas: Normal pancreatic contour without signs of inflammation. Spleen: Normal. Adrenals/Urinary Tract: Adrenal glands are normal. Smooth renal contours. Large RIGHT upper pole renal cyst is unchanged and no dedicated imaging follow-up is recommended for this finding or the area of low attenuation in the anterior upper pole LEFT kidney that measures approximately a cm. Urinary bladder is unremarkable. Stomach/Bowel: Moderate distension of the sigmoid filled with formed stool, caliber approaching 6 cm. Adjacent inflammation.  Scattered diverticulosis. Upstream colon is moderately dilated filled with liquid stool and shows pericolonic stranding with loss of haustration. No signs of stranding adjacent to the stomach. No small bowel dilation. Appendix is normal. Vascular/Lymphatic: Aortic atherosclerosis. No sign of aneurysm. Smooth contour of the IVC. There is no gastrohepatic or hepatoduodenal ligament lymphadenopathy. No retroperitoneal or mesenteric lymphadenopathy. No pelvic sidewall lymphadenopathy. Small retroperitoneal lymph nodes less than a cm scattered throughout the retroperitoneum without change. Also vascular assessment limited by lack of intravenous contrast. Reproductive: Unremarkable by CT aside from calcified fibroid in the posterior uterus. Other: No ascites. Stranding about the descending colon. No free air. Musculoskeletal: No acute bone finding. No destructive bone process. Spinal degenerative changes. IMPRESSION: 1. Formed stool in the sigmoid and descending colon is slightly increased and remains associated with moderate colonic distension and pericolonic stranding in the setting of diverticular changes. There is now diffuse colitis with ahaustral appearance of the more proximal colon which is moderately distended suggesting developing obstruction. Findings may represent diverticulitis or stercoral colitis with formed stool in the distal colon leading to upstream obstruction and diffuse colitis. Would correlate with any signs of C diff colitis as well as with lactate and with any risk factors for colitis related to ongoing therapy. 2. GI and or surgical consultation may be helpful with close follow-up suggested due to presence of diffuse colitis. 3. Liver displays a cirrhotic morphology with fissural widening and LEFT lobe hypertrophy. 4. Post cholecystectomy without gross biliary duct distension. 5. Aortic atherosclerosis. Aortic Atherosclerosis (ICD10-I70.0). Electronically Signed   By: Zetta Bills M.D.   On:  08/24/2021 10:02    Procedures Procedures    Medications Ordered in ED Medications  sodium chloride 0.9 % bolus 1,000 mL (1,000 mLs Intravenous New Bag/Given 08/25/2021 0849)  ondansetron (ZOFRAN) injection 4 mg (4 mg Intravenous Given 09/04/2021 0849)  traMADol (ULTRAM) tablet 50 mg (50 mg Oral Given 08/21/2021 0858)    ED Course/ Medical Decision Making/ A&P Clinical Course as of 09/12/2021 1251  Wed Sep 15, 2021  0817 Confirmed with patient that she is able to take morphine. Meds ordered.  [SB]  [id number] Notified by RN that patient doesn't want morphine as it may cause her to be more constipated. Patient notes that she would like her home dose of tramadol at this time.  [SB]  0911 WBC(!): 17.8 [SB]  1018 Patient re-evaluated and noted improvement of symptoms with treatment regimen in the ED. Resting comfortably on stretcher.  Discussed with patient and husband lab and imaging findings.  Discussed treatment plan for admission at this time.  Patient agreeable at this time. [SB]  1032 Consult with General Surgery, Alferd Apa, PA-C who evaluated the scans with their attending and noted that at this time, there is no concern for surgery at this time. Also recommended treatment for colitis.  [SB]  1125 Consult with Gastroenterologist, Dr. Collene Mares who will evaluate the patient in the ED. Also recommends treating for diverticulitis.  [SB]  1142 Discussed with patient antibiotic treatment plan, patient notes that she was able to take augmentin without difficulty.  [SB]  1207 Consult with Hospitalist, Dr. Posey Pronto who recommends admission at this time.  [SB]    Clinical Course User Index [SB] Johnnette Laux A, PA-C                           Medical Decision Making Amount and/or Complexity of Data Reviewed Labs: ordered. Decision-making details documented in ED Course. Radiology: ordered.  Risk Prescription drug management. Decision regarding hospitalization.   Pt presents with generalized abdominal  pain onset 3-4 days. Pt afebrile in the ED. Currently receiving chemotherapy for breast cancer with her last treatment being yesterday. On exam, pt with diffuse abdominal TTP, left>right. No acute cardiovascular, respiratory, exam findings. Differential diagnosis includes SBO, bowel perforation, diverticulitis, constipation, appendicitis, pancreatitis.    Co morbidities that complicate the patient evaluation: Breast cancer  Additional history obtained:  Additional history obtained from Spouse/Significant Other External records from outside source obtained and reviewed including: Pt was evaluated in the ED on May 2023 for similar concerns at that time, the patient was admitted to the hospital.  Labs:  I ordered, and personally interpreted labs.  The pertinent results include:   Lipase unremarkable at 45 Urinalysis unremarkable at this time CBC with leukocytosis at 17.8 otherwise unremarkable CMP with slightly elevated glucose at 136, creatinine slightly elevated at 1.01 however stable from previous value otherwise unremarkable C. difficile ordered with results pending at time of admission  Imaging: I ordered imaging studies including CT abdomen pelvis wo I independently visualized and interpreted imaging which showed:  1. Formed stool in the sigmoid and descending colon is slightly  increased and remains associated with moderate colonic distension  and pericolonic stranding in the setting of diverticular changes.  There is now diffuse colitis with ahaustral appearance of the more  proximal colon which is moderately distended suggesting developing  obstruction. Findings may represent diverticulitis or stercoral  colitis with formed stool in the distal colon leading to upstream  obstruction and diffuse colitis. Would correlate with any signs of C  diff colitis as well as with lactate and with any risk factors for  colitis related to ongoing therapy.  2. GI and or surgical consultation may  be helpful with close  follow-up suggested due to presence of diffuse colitis.  3. Liver displays a cirrhotic morphology with fissural widening and  LEFT lobe hypertrophy.  4. Post cholecystectomy without gross biliary duct distension.  5. Aortic atherosclerosis.   I agree with the radiologist interpretation  Medications:  I ordered medication including tramadol, zofran, IVF for symptom management Reevaluation of the patient after these medicines and interventions, I reevaluated the patient and found that they have improved I have reviewed the patients home medicines and have made adjustments as needed  Consultations: -Consultation with general surgery team, Alferd Apa PA-C who notes that evaluated CT scan with attending at this time.  No concern for surgery needed at this time.  Surgery will be available for consult should that change.  Recommended treatment with antibiotics.  I requested consultation with the Gastroenterologist, Dr. Collene Mares and discussed lab and imaging findings as well as pertinent plan - they recommend: We will evaluate patient in the ED also recommends treatment for diverticulitis and medical admission.  -Requested consult with hospitalist, Dr. Posey Pronto who recommends admission at this time.   Disposition: Presentation suspicious for diverticulitis.  Doubt pancreatitis, appendicitis, acute cystitis.  Doubt small bowel obstruction, bowel perforation at this time. After consideration of the diagnostic results and the patients response to treatment, I feel that the patient would benefit from Admission to the hospital.  Discussed with patient and family at bedside regarding admission plans.  Patient agreeable to admission at this time.  Patient appears safe for admission at this time.   This chart was dictated using voice recognition software, Dragon. Despite the best efforts of this provider to proofread and correct errors, errors may still occur which can change  documentation meaning.   Final Clinical Impression(s) / ED Diagnoses Final diagnoses:  Generalized abdominal pain  Diverticulitis    Rx / DC Orders ED Discharge Orders     None         Trellis Guirguis A, PA-C 09/04/2021 1255    Pattricia Boss, MD 09/07/2021 [phone]

## 2021-09-15 NOTE — H&P (Addendum)
History and Physical    Patient: Christie Thomas WLN:[id number] DOB: [birth year] DOA: 09/01/2021 DOS: the patient was seen and examined on 09/14/2021 PCP: Volanda Napoleon, MD  Patient coming from: Home  Chief Complaint:  Chief Complaint  Patient presents with   Abdominal Pain   HPI: Christie Thomas is a 66 y.o. female with medical history significant of breast cancer currently receiving chemotherapy, diverticulitis, reflux, hyperlipidemia, asthma here for progressively worsening right abdominal pain for the past 4 days.  She reports she has constipation after chemo treatment.  Her last round of chemo was yesterday.  She tried a stool softener and enema at home without relief of her symptoms.  She reports she is still having some gas movement.  Last time she had a bowel movement was 4 days ago.  Denies fever, chills, urinary symptoms, chest pain, shortness of breath, lower extremity IMA, syncope or presyncope.    Review of Systems: As mentioned in the history of present illness. All other systems reviewed and are negative. Past Medical History:  Diagnosis Date   Anxiety 01/20/2016   Asthma    Cancer ([place]) 02/09/2021   right breast   Diverticulitis 01/20/2016   Diverticulosis 01/20/2016   Epilepsy ([place]) 01/20/2016   Adolescent    Fatty liver 01/20/2016   GERD (gastroesophageal reflux disease)    Hay fever    Hyperlipidemia    IBS (irritable bowel syndrome) 01/20/2016   Obesity 01/20/2016   OSA (obstructive sleep apnea) 09/07/2017   Osteoarthritis of both hands 01/20/2016   PONV (postoperative nausea and vomiting)    Swelling of ankle joint, left 01/20/2016   Trochanteric bursitis 01/20/2016   Past Surgical History:  Procedure Laterality Date   AUGMENTATION MAMMAPLASTY     BACK SURGERY     x2   BREAST BIOPSY     fatty tumor   BREAST ENHANCEMENT SURGERY     BREAST LUMPECTOMY WITH RADIOACTIVE SEED AND SENTINEL LYMPH NODE BIOPSY Right 05/17/2021   Procedure: RIGHT  BREAST LUMPECTOMY WITH RADIOACTIVE SEED;  Surgeon: Jovita Kussmaul, MD;  Location: [facility];  Service: General;  Laterality: Right;   BREAST RECONSTRUCTION Bilateral 05/17/2021   Procedure: BREAST RECONSTRUCTION REMOVAL IMPLANTS AND REPLACEMENT OF IMPLANTS;  Surgeon: Wallace Going, DO;  Location: [facility];  Service: Plastics;  Laterality: Bilateral;   BREAST REDUCTION SURGERY Bilateral 05/17/2021   Procedure: ONCOPLASTIC BREAST REDUCTION;  Surgeon: Wallace Going, DO;  Location: [facility];  Service: Plastics;  Laterality: Bilateral;   CARDIAC CATHETERIZATION  07/2010   CARPAL TUNNEL RELEASE     CATARACT EXTRACTION Bilateral    2023   CESAREAN SECTION     CHOLECYSTECTOMY     HAND SURGERY     carpal tunnel release, cyst excision   PORTACATH PLACEMENT N/A 07/26/2021   Procedure: INSERTION PORT-A-CATH;  Surgeon: Jovita Kussmaul, MD;  Location: [facility];  Service: General;  Laterality: N/A;   SENTINEL NODE BIOPSY Right 05/17/2021   Procedure: RIGHT SENTINEL LYMPH NODE BIOPSY WITH MAGTRACE INJECTION;  Surgeon: Jovita Kussmaul, MD;  Location: [facility];  Service: General;  Laterality: Right;   SHOULDER SURGERY     right   TONSILLECTOMY AND ADENOIDECTOMY     Social History:  reports that she has never smoked. She has never used smokeless tobacco. She reports that she does not currently use alcohol. She reports that she does not use drugs.  Allergies  Allergen Reactions   Shellfish Allergy Anaphylaxis   Cefepime Swelling  Lips swelling and numbness; chest tightness   Cephalexin     Other reaction(s): Unknown   Ciprofloxacin     Other reaction(s): Unknown   Codeine Other (See Comments)    headache   Metoclopramide Other (See Comments)   Latex Rash    Skin appears to be burned    Family History  Problem Relation Age of Onset   Stroke Mother    Hypertension Mother    Hyperlipidemia Mother    Diabetes Mother    Cancer Mother    Sudden death Paternal Grandfather 36   Alzheimer's disease Father     Heart attack Neg Hx     Prior to Admission medications   Medication Sig Start Date End Date Taking? Authorizing Provider  acetaminophen (TYLENOL) 500 MG tablet Take 500 mg by mouth every 6 (six) hours as needed for moderate pain.   Yes [provider]  antiseptic oral rinse (BIOTENE) LIQD 15 mLs by Mouth Rinse route 3 (three) times daily as needed for dry mouth.   Yes [provider]  clonazePAM (KLONOPIN) 0.5 MG tablet Take 0.5 tablets (0.25 mg total) by mouth at bedtime. 08/07/21  Yes Aline August, MD  dexamethasone (DECADRON) 4 MG tablet Take 2 tablets (8 mg total) by mouth 2 (two) times daily. Start the day before Taxotere. Then again the day after chemo for 3 days. 07/16/21  Yes Volanda Napoleon, MD  famotidine (PEPCID) 40 MG tablet Take 1 tablet (40 mg total) by mouth 2 (two) times daily. 08/24/21  Yes Volanda Napoleon, MD  fenofibrate 160 MG tablet TAKE 1 TABLET ONCE DAILY WITH FOOD. Patient taking differently: Take 160 mg by mouth daily. 08/13/21  Yes Midge Minium, MD  fluconazole (DIFLUCAN) 200 MG tablet Take 1 tablet (200 mg total) by mouth daily. Patient taking differently: Take 200 mg by mouth daily as needed (yeast). 08/31/21  Yes Volanda Napoleon, MD  fluticasone (FLONASE) 50 MCG/ACT nasal spray Place 1-2 sprays into both nostrils daily as needed for allergies. 08/07/21  Yes Aline August, MD  lactulose (CHRONULAC) 10 GM/15ML solution Take 30 mLs (20 g total) by mouth 2 (two) times daily as needed for mild constipation. 09/14/21  Yes Volanda Napoleon, MD  lidocaine-prilocaine (EMLA) cream Apply 1 application. topically daily as needed. Apply to affected area once 08/07/21  Yes Aline August, MD  naproxen sodium (ALEVE) 220 MG tablet Take 220 mg by mouth daily as needed (pain).   Yes [provider]  ondansetron (ZOFRAN) 8 MG tablet Take 1 tablet (8 mg total) by mouth 2 (two) times daily as needed for refractory nausea / vomiting. Start on day 3 after  chemo. 07/16/21  Yes Volanda Napoleon, MD  OVER THE COUNTER MEDICATION daily. Ultra Flora IB   Yes [provider]  OVER THE COUNTER MEDICATION Take 1 capsule by mouth daily. EMMA for Gut health   Yes [provider]  prochlorperazine (COMPAZINE) 10 MG tablet Take 1 tablet (10 mg total) by mouth every 6 (six) hours as needed (Nausea or vomiting). Patient taking differently: Take 10 mg by mouth every 6 (six) hours as needed for vomiting or nausea (Nausea or vomiting). 07/16/21  Yes Ennever, Rudell Cobb, MD  Propylene Glycol (SYSTANE COMPLETE OP) Place 1 drop into both eyes daily as needed (dry eyes).   Yes [provider]  rosuvastatin (CRESTOR) 10 MG tablet Take 1 tablet (10 mg total) by mouth at bedtime. 08/07/21  Yes Aline August, MD  traMADol (ULTRAM) 50 MG tablet Take 50 mg by mouth every 6 (six) hours as needed for severe pain.   Yes [provider]  magic mouthwash SOLN Take 10 mLs by mouth 4 (four) times daily as needed for mouth pain. Patient not taking: Reported on 08/22/2021 08/31/21   Volanda Napoleon, MD  OLANZapine (ZYPREXA) 10 MG tablet Take 1 tablet (10 mg total) by mouth at bedtime. Patient not taking: Reported on 08/26/2021 08/31/21   Volanda Napoleon, MD    Physical Exam: Vitals:   09/02/2021 1430 08/22/2021 1500 08/20/2021 1530 08/26/2021 1648  BP: (!) 142/77 129/68 131/72 134/71  Pulse: 94 84 85 88  Resp:   16 17  Temp:    98.4 F (36.9 C)  TempSrc:    Oral  SpO2: 97% 97% 96% 98%  Weight:      Height:       Physical Exam Vitals and nursing note reviewed.  Constitutional:      Appearance: Normal appearance. She is obese. She is not ill-appearing.  HENT:     Head: Normocephalic and atraumatic.     Mouth/Throat:     Mouth: Mucous membranes are moist.  Cardiovascular:     Rate and Rhythm: Normal rate and regular rhythm.  Abdominal:     General: Abdomen is flat. Bowel sounds are decreased. There is no distension.     Palpations: Abdomen is  soft. There is no mass.     Tenderness: There is abdominal tenderness. There is no guarding or rebound.  Musculoskeletal:     Right lower leg: No edema.     Left lower leg: No edema.  Skin:    General: Skin is warm and dry.     Capillary Refill: Capillary refill takes less than 2 seconds.  Neurological:     Mental Status: She is alert.  Psychiatric:        Mood and Affect: Mood normal.        Behavior: Behavior normal.     Data Reviewed:     Latest Ref Rng & Units 08/21/2021    8:50 AM 09/14/2021    8:30 AM 08/31/2021    9:37 AM  CBC  WBC 4.0 - 10.5 K/uL 17.8  11.1  17.9   Hemoglobin 12.0 - 15.0 g/dL 13.6  12.3  13.4   Hematocrit 36.0 - 46.0 % 41.6  37.5  40.5   Platelets 150 - 400 K/uL 315  244  188       Latest Ref Rng & Units 09/16/2021    8:50 AM 09/14/2021    8:30 AM 08/31/2021    9:37 AM  BMP  Glucose 70 - 99 mg/dL 136  182  115   BUN 8 - 23 mg/dL '23  17  21   '$ Creatinine 0.44 - 1.00 mg/dL 1.01  0.92  1.22   Sodium 135 - 145 mmol/L 145  142  136   Potassium 3.5 - 5.1 mmol/L 4.0  4.3  3.7   Chloride 98 - 111 mmol/L 113  110  102   CO2 22 - 32 mmol/L '22  22  25   '$ Calcium 8.9 - 10.3 mg/dL 9.6  10.2  9.8    CT: 1. Formed stool in the sigmoid and descending colon is slightly increased and remains associated with moderate colonic distension and pericolonic stranding in the setting of diverticular changes. There is now diffuse colitis with ahaustral appearance of the more proximal colon which is moderately distended suggesting  developing obstruction. Findings may represent diverticulitis or stercoral colitis with formed stool in the distal colon leading to upstream obstruction and diffuse colitis. Would correlate with any signs of C diff colitis as well as with lactate and with any risk factors for colitis related to ongoing therapy. 2. GI and or surgical consultation may be helpful with close follow-up suggested due to presence of diffuse colitis. 3. Liver displays a  cirrhotic morphology with fissural widening and LEFT lobe hypertrophy. 4. Post cholecystectomy without gross biliary duct distension. 5. Aortic atherosclerosis.  Assessment and Plan:  Christie Thomas is a 66 y.o. female with medical history significant of breast cancer currently receiving chemotherapy, diverticulitis, reflux, hyperlipidemia, asthma here for progressively worsening abdominal pain for the past 4 days concerning for diverticulitis.  Abdominal pain Acute colitis w c/f diverticulitis C/f large bowel obstruction Here for progressively worsening abdominal pain for the past 4 days concerning for diverticulitis.  Reports she often gets constipation after chemotherapy which she received yesterday.  She has not had a bowel movement for past 4 days, passing a little bit of gas.  ED had discussed with general surgery who reports they would not intervene at this time recommended conservative therapy with antibiotics, bowel rest, IV fluids.  She reports nausea but no significant vomiting at this time. -IV antibiotics with Zosyn - N.p.o. and bowel rest with LR at 75 cc/h for 12 hours -Nausea controlled with Compazine, pain control with tramadol preferred by patient -Dexamethasone 8 mg IV twice daily --hold lactulose   Breast cancer currently receiving chemotherapy Currently receiving chemotherapy. -Dr. Marin Olp is aware of admission -Patient is supposed to get an infusion on 6/29 does not know the name of it, and cannot find in her home medications or notes---please d/w Dr. Marin Olp  Nausea medication -as above, she also reports takes zyprexa for nausea and will hold this  High cholesterol  hold Crestor  DVT ppx: Lovenox Diet: NPO Advance Care Planning:   Code Status: Prior full Consults: GI Family Communication: d/w husband at bedside  Severity of Illness: The appropriate patient status for this patient is INPATIENT. Inpatient status is judged to be reasonable and necessary  in order to provide the required intensity of service to ensure the patient's safety. The patient's presenting symptoms, physical exam findings, and initial radiographic and laboratory data in the context of their chronic comorbidities is felt to place them at high risk for further clinical deterioration. Furthermore, it is not anticipated that the patient will be medically stable for discharge from the hospital within 2 midnights of admission.   * I certify that at the point of admission it is my clinical judgment that the patient will require inpatient hospital care spanning beyond 2 midnights from the point of admission due to high intensity of service, high risk for further deterioration and high frequency of surveillance required.*  Author: Lorelei Pont, MD 08/29/2021 6:30 PM  For on call review [email or web address].

## 2021-09-15 NOTE — Consult Note (Addendum)
Reason for Consult:Recurrent acute diverticulitis. Referring Physician: ER MD  Christie Thomas is an 66 y.o. female.  HPI: Christie Thomas is a 66 year old  female with multiple medical problems listed below, recently diagnosed with Stage 1b (T1cN0M0) infiltrating lobular carcinoma of the right breast with an Oncotype score of 28. She had a lumpectomy on 05/17/21 along with removal of the ruptured silicone implants and placement of bilateral breast implants along with bilateral mastopexy. On 08/01/21 she was admitted with febrile neutropenia complicating acute diverticulitis. She claims that last 4 days she been having worsening problems constipation abdominal distention.  She saw Dr. Marin Olp yesterday when she received her third dose of chemotherapy and was prescribed Lactulose 30 cc;.after taking the lactulose she had a lot of abdominal cramping but did not have much in the way of BMs.She then took an enema when she passed some stool balls. She claims she was up all night with abdominal pain and cramping, nausea and vomiting and therefore presented to the emergency room today. She has had some chills. CT scan of the abdomen pelvis done in the ER showed formed stool in the sigmoid and descending colon with moderate distention and pericolonic stranding in the setting of diverticular disease consistent with diverticulitis; there is description of a diffuse colitis with a haustral appearance in the proximal colon with moderate distention suggesting a possible  obstruction versus diverticulitis versus stercoral colitis; the liver it seems to be cirrhotic in morphology with left lobe hypertrophy.  Aortic atherosclerosis was also described and postoperative changes consistent with a cholecystectomy were noted. She received Zosyn in the ER for diverticulitis.  He claims she is feeling much better since she received the antibiotics but continues to have some chills.  She has passed some flatus but has not had a  bowel add a bowel movement since she came to the hospital.  Has a history of reflux and has been on PPIs in the past.  Her medication list shows that she is taking Pepcid 40 mg twice daily recently.  Past Medical History:  Diagnosis Date   Anxiety 01/20/2016   Asthma    Cancer ([place]) 02/09/2021   right breast   Diverticulitis 01/20/2016   Diverticulosis 01/20/2016   Epilepsy ([place]) 01/20/2016   Adolescent    Fatty liver 01/20/2016   GERD (gastroesophageal reflux disease)    Hay fever    Hyperlipidemia    IBS (irritable bowel syndrome) 01/20/2016   Obesity 01/20/2016   OSA (obstructive sleep apnea) 09/07/2017   Osteoarthritis of both hands 01/20/2016   PONV (postoperative nausea and vomiting)    Swelling of ankle joint, left 01/20/2016   Trochanteric bursitis 01/20/2016   Past Surgical History:  Procedure Laterality Date   AUGMENTATION MAMMAPLASTY     BACK SURGERY     x2   BREAST BIOPSY     fatty tumor   BREAST ENHANCEMENT SURGERY     BREAST LUMPECTOMY WITH RADIOACTIVE SEED AND SENTINEL LYMPH NODE BIOPSY Right 05/17/2021   Procedure: RIGHT BREAST LUMPECTOMY WITH RADIOACTIVE SEED;  Surgeon: Jovita Kussmaul, MD;  Location: [facility];  Service: General;  Laterality: Right;   BREAST RECONSTRUCTION Bilateral 05/17/2021   Procedure: BREAST RECONSTRUCTION REMOVAL IMPLANTS AND REPLACEMENT OF IMPLANTS;  Surgeon: Wallace Going, DO;  Location: [facility];  Service: Plastics;  Laterality: Bilateral;   BREAST REDUCTION SURGERY Bilateral 05/17/2021   Procedure: ONCOPLASTIC BREAST REDUCTION;  Surgeon: Wallace Going, DO;  Location: [facility];  Service: Plastics;  Laterality: Bilateral;  CARDIAC CATHETERIZATION  07/2010   CARPAL TUNNEL RELEASE     CATARACT EXTRACTION Bilateral    2023   CESAREAN SECTION     CHOLECYSTECTOMY     HAND SURGERY     carpal tunnel release, cyst excision   PORTACATH PLACEMENT N/A 07/26/2021   Procedure: INSERTION PORT-A-CATH;  Surgeon: Jovita Kussmaul, MD;  Location:  [facility];  Service: General;  Laterality: N/A;   SENTINEL NODE BIOPSY Right 05/17/2021   Procedure: RIGHT SENTINEL LYMPH NODE BIOPSY WITH MAGTRACE INJECTION;  Surgeon: Jovita Kussmaul, MD;  Location: [facility];  Service: General;  Laterality: Right;   SHOULDER SURGERY     right   TONSILLECTOMY AND ADENOIDECTOMY     Family History  Problem Relation Age of Onset   Stroke Mother    Hypertension Mother    Hyperlipidemia Mother    Diabetes Mother    Cancer Mother    Sudden death Paternal Grandfather 42   Alzheimer's disease Father    Heart attack Neg Hx    Social History:  reports that she has never smoked. She has never used smokeless tobacco. She reports that she does not currently use alcohol. She reports that she does not use drugs.  Allergies:  Allergies  Allergen Reactions   Shellfish Allergy Anaphylaxis   Cefepime Swelling    Lips swelling and numbness; chest tightness   Cephalexin     Other reaction(s): Unknown   Ciprofloxacin     Other reaction(s): Unknown   Codeine Other (See Comments)    headache   Metoclopramide Other (See Comments)   Latex Rash    Skin appears to be burned   Medications: I have reviewed the patient's current medications. Prior to Admission:  Medications Prior to Admission  Medication Sig Dispense Refill Last Dose   acetaminophen (TYLENOL) 500 MG tablet Take 500 mg by mouth every 6 (six) hours as needed for moderate pain.   unk   antiseptic oral rinse (BIOTENE) LIQD 15 mLs by Mouth Rinse route 3 (three) times daily as needed for dry mouth.   09/14/2021   clonazePAM (KLONOPIN) 0.5 MG tablet Take 0.5 tablets (0.25 mg total) by mouth at bedtime.   09/14/2021   dexamethasone (DECADRON) 4 MG tablet Take 2 tablets (8 mg total) by mouth 2 (two) times daily. Start the day before Taxotere. Then again the day after chemo for 3 days. 30 tablet 1 09/14/2021   famotidine (PEPCID) 40 MG tablet Take 1 tablet (40 mg total) by mouth 2 (two) times daily. 30 tablet 3 09/14/2021    fenofibrate 160 MG tablet TAKE 1 TABLET ONCE DAILY WITH FOOD. (Patient taking differently: Take 160 mg by mouth daily.) 90 tablet 0 09/14/2021   fluconazole (DIFLUCAN) 200 MG tablet Take 1 tablet (200 mg total) by mouth daily. (Patient taking differently: Take 200 mg by mouth daily as needed (yeast).) 30 tablet 2 Past Week   fluticasone (FLONASE) 50 MCG/ACT nasal spray Place 1-2 sprays into both nostrils daily as needed for allergies.   Past Week   lactulose (CHRONULAC) 10 GM/15ML solution Take 30 mLs (20 g total) by mouth 2 (two) times daily as needed for mild constipation. 236 mL 3 09/14/2021   lidocaine-prilocaine (EMLA) cream Apply 1 application. topically daily as needed. Apply to affected area once   unk   naproxen sodium (ALEVE) 220 MG tablet Take 220 mg by mouth daily as needed (pain).   Past Week   ondansetron (ZOFRAN) 8 MG tablet Take 1 tablet (  8 mg total) by mouth 2 (two) times daily as needed for refractory nausea / vomiting. Start on day 3 after chemo. 30 tablet 1 09/14/2021   OVER THE COUNTER MEDICATION daily. Ultra Flora IB   Past Week   OVER THE COUNTER MEDICATION Take 1 capsule by mouth daily. EMMA for Gut health   09/14/2021   prochlorperazine (COMPAZINE) 10 MG tablet Take 1 tablet (10 mg total) by mouth every 6 (six) hours as needed (Nausea or vomiting). (Patient taking differently: Take 10 mg by mouth every 6 (six) hours as needed for vomiting or nausea (Nausea or vomiting).) 30 tablet 1 unk   Propylene Glycol (SYSTANE COMPLETE OP) Place 1 drop into both eyes daily as needed (dry eyes).   Past Week   rosuvastatin (CRESTOR) 10 MG tablet Take 1 tablet (10 mg total) by mouth at bedtime.   09/14/2021   traMADol (ULTRAM) 50 MG tablet Take 50 mg by mouth every 6 (six) hours as needed for severe pain.   09/14/2021   magic mouthwash SOLN Take 10 mLs by mouth 4 (four) times daily as needed for mouth pain. (Patient not taking: Reported on 09/03/2021) 240 mL 0 Not Taking   OLANZapine (ZYPREXA) 10 MG  tablet Take 1 tablet (10 mg total) by mouth at bedtime. (Patient not taking: Reported on 08/19/2021) 30 tablet 1 Not Taking   Scheduled:  diphenhydrAMINE  25 mg Intravenous Once   Continuous:  lactated ringers 75 mL/hr at 09/13/2021 1652   piperacillin-tazobactam (ZOSYN)  IV 3.375 g (09/10/2021 1420)   Results for orders placed or performed during the hospital encounter of 08/22/2021 (from the past 48 hour(s))  CBC with Differential     Status: Abnormal   Collection Time: 08/29/2021  8:50 AM  Result Value Ref Range   WBC 17.8 (H) 4.0 - 10.5 K/uL   RBC 4.48 3.87 - 5.11 MIL/uL   Hemoglobin 13.6 12.0 - 15.0 g/dL   HCT 41.6 36.0 - 46.0 %   MCV 92.9 80.0 - 100.0 fL   MCH 30.4 26.0 - 34.0 pg   MCHC 32.7 30.0 - 36.0 g/dL   RDW 16.3 (H) 11.5 - 15.5 %   Platelets 315 150 - 400 K/uL   nRBC 0.0 0.0 - 0.2 %   Neutrophils Relative % 87 %   Neutro Abs 15.7 (H) 1.7 - 7.7 K/uL   Lymphocytes Relative 6 %   Lymphs Abs 1.0 0.7 - 4.0 K/uL   Monocytes Relative 6 %   Monocytes Absolute 1.0 0.1 - 1.0 K/uL   Eosinophils Relative 0 %   Eosinophils Absolute 0.0 0.0 - 0.5 K/uL   Basophils Relative 0 %   Basophils Absolute 0.0 0.0 - 0.1 K/uL   Immature Granulocytes 1 %   Abs Immature Granulocytes 0.12 (H) 0.00 - 0.07 K/uL    Comment: Performed at [facility], [place] [address] [place], [place] [address]  Comprehensive metabolic panel     Status: Abnormal   Collection Time: 08/25/2021  8:50 AM  Result Value Ref Range   Sodium 145 135 - 145 mmol/L   Potassium 4.0 3.5 - 5.1 mmol/L   Chloride 113 (H) 98 - 111 mmol/L   CO2 22 22 - 32 mmol/L   Glucose, Bld 136 (H) 70 - 99 mg/dL    Comment: Glucose reference range applies only to samples taken after fasting for at least 8 hours.   BUN 23 8 - 23 mg/dL   Creatinine, Ser 1.01 (H) 0.44 -  1.00 mg/dL   Calcium 9.6 8.9 - 10.3 mg/dL   Total Protein 7.2 6.5 - 8.1 g/dL   Albumin 4.1 3.5 - 5.0 g/dL   AST 34 15 - 41 U/L   ALT 25 0 - 44 U/L   Alkaline  Phosphatase 56 38 - 126 U/L   Total Bilirubin 0.9 0.3 - 1.2 mg/dL   GFR, Estimated >60 >60 mL/min    Comment: (NOTE) Calculated using the CKD-EPI Creatinine Equation (2021)    Anion gap 10 5 - 15    Comment: Performed at [facility], [place] [address], [place], [place] [address]  Lipase, blood     Status: None   Collection Time: 08/19/2021  8:50 AM  Result Value Ref Range   Lipase 45 11 - 51 U/L    Comment: Performed at [facility], [place] [address] [place], [place] [address]  Urinalysis, Routine w reflex microscopic Urine, Clean Catch     Status: None   Collection Time: 08/25/2021 10:14 AM  Result Value Ref Range   Color, Urine YELLOW YELLOW   APPearance CLEAR CLEAR   Specific Gravity, Urine 1.024 1.005 - 1.030   pH 5.0 5.0 - 8.0   Glucose, UA NEGATIVE NEGATIVE mg/dL   Hgb urine dipstick NEGATIVE NEGATIVE   Bilirubin Urine NEGATIVE NEGATIVE   Ketones, ur NEGATIVE NEGATIVE mg/dL   Protein, ur NEGATIVE NEGATIVE mg/dL   Nitrite NEGATIVE NEGATIVE   Leukocytes,Ua NEGATIVE NEGATIVE    Comment: Performed at [facility], [place] [address] [place], [place] [address]    CT ABDOMEN PELVIS WO CONTRAST  Result Date: 09/14/2021 CLINICAL DATA:  Abdominal pain with nausea vomiting and diarrhea in a 66 year old female. History of chemotherapy for breast cancer. * Tracking Code: BO * EXAM: CT ABDOMEN AND PELVIS WITHOUT CONTRAST TECHNIQUE: Multidetector CT imaging of the abdomen and pelvis was performed following the standard protocol without IV contrast. RADIATION DOSE REDUCTION: This exam was performed according to the departmental dose-optimization program which includes automated exposure control, adjustment of the mA and/or kV according to patient size and/or use of iterative reconstruction technique. COMPARISON:  Aug 01, 2021. FINDINGS: Lower chest: Central venous access device terminates in the caval to atrial junction. Mild basilar atelectasis.  Bilateral breast implants. No pericardial effusion. Hepatobiliary: Liver displays a cirrhotic morphology with fissural widening and LEFT lobe hypertrophy. No visible lesion on noncontrast imaging. Post cholecystectomy without gross biliary duct distension. Pancreas: Normal pancreatic contour without signs of inflammation. Spleen: Normal. Adrenals/Urinary Tract: Adrenal glands are normal. Smooth renal contours. Large RIGHT upper pole renal cyst is unchanged and no dedicated imaging follow-up is recommended for this finding or the area of low attenuation in the anterior upper pole LEFT kidney that measures approximately a cm. Urinary bladder is unremarkable. Stomach/Bowel: Moderate distension of the sigmoid filled with formed stool, caliber approaching 6 cm. Adjacent inflammation. Scattered diverticulosis. Upstream colon is moderately dilated filled with liquid stool and shows pericolonic stranding with loss of haustration. No signs of stranding adjacent to the stomach. No small bowel dilation. Appendix is normal. Vascular/Lymphatic: Aortic atherosclerosis. No sign of aneurysm. Smooth contour of the IVC. There is no gastrohepatic or hepatoduodenal ligament lymphadenopathy. No retroperitoneal or mesenteric lymphadenopathy. No pelvic sidewall lymphadenopathy. Small retroperitoneal lymph nodes less than a cm scattered throughout the retroperitoneum without change. Also vascular assessment limited by lack of intravenous contrast. Reproductive: Unremarkable by CT aside from calcified fibroid in the posterior uterus. Other: No ascites. Stranding about the descending  colon. No free air. Musculoskeletal: No acute bone finding. No destructive bone process. Spinal degenerative changes. IMPRESSION: 1. Formed stool in the sigmoid and descending colon is slightly increased and remains associated with moderate colonic distension and pericolonic stranding in the setting of diverticular changes. There is now diffuse colitis with  ahaustral appearance of the more proximal colon which is moderately distended suggesting developing obstruction. Findings may represent diverticulitis or stercoral colitis with formed stool in the distal colon leading to upstream obstruction and diffuse colitis. Would correlate with any signs of C diff colitis as well as with lactate and with any risk factors for colitis related to ongoing therapy. 2. GI and or surgical consultation may be helpful with close follow-up suggested due to presence of diffuse colitis. 3. Liver displays a cirrhotic morphology with fissural widening and LEFT lobe hypertrophy. 4. Post cholecystectomy without gross biliary duct distension. 5. Aortic atherosclerosis. Aortic Atherosclerosis (ICD10-I70.0). Electronically Signed   By: Zetta Bills M.D.   On: 08/26/2021 10:02    Review of Systems  Constitutional:  Positive for activity change, appetite change, chills and fatigue. Negative for fever and unexpected weight change.  HENT: Negative.    Gastrointestinal:  Positive for abdominal distention, abdominal pain, constipation, nausea and vomiting. Negative for anal bleeding, blood in stool, diarrhea and rectal pain.  Musculoskeletal:  Positive for arthralgias.  Neurological:  Positive for light-headedness.  Psychiatric/Behavioral:  Positive for agitation. The patient is nervous/anxious.    Blood pressure 131/72, pulse 85, temperature 98.8 F (37.1 C), temperature source Oral, resp. rate 16, height '5\' 4"'$  (1.626 m), weight 81.6 kg, SpO2 96 %. Physical Exam Constitutional:      General: She is in acute distress.     Appearance: She is well-developed. She is ill-appearing and toxic-appearing. She is not diaphoretic.  HENT:     Head: Normocephalic and atraumatic.     Mouth/Throat:     Pharynx: Oropharynx is clear.  Eyes:     Extraocular Movements: Extraocular movements intact.     Pupils: Pupils are equal, round, and reactive to light.  Cardiovascular:     Rate and Rhythm:  Tachycardia present.     Heart sounds: No murmur heard.    No gallop.  Pulmonary:     Effort: Pulmonary effort is normal. No respiratory distress.     Breath sounds: Normal breath sounds. No wheezing.  Abdominal:     General: Abdomen is protuberant. Bowel sounds are decreased. There is distension.     Palpations: Abdomen is soft. There is no hepatomegaly.     Tenderness: There is abdominal tenderness in the left lower quadrant.     Hernia: No hernia is present.  Skin:    General: Skin is warm and dry.  Neurological:     General: No focal deficit present.     Mental Status: She is alert.  Psychiatric:        Mood and Affect: Mood normal.        Behavior: Behavior normal.   Assessment/Plan: Chronic constipation complicated by acute diverticulitis status postchemotherapy for breast cancer with possibility of acute colitis possible due to stercoral inflammation-agree with Zosyn we will give the patient Colace to PO TID for now she does not want any laxatives or enemas at the present time as she has had a rough night and wants to rest tonight. I will allow her some clears as she has bowel sounds and does not seem to be obstructed. 2) Right-sided breast cancer with Oncotype score  of 28 status postlumpectomy on 05/17/2021 on Taxotere and Cytoxan status post cycle 2 out of 4 on 09/14/2021 3) History of oral thrush recently treated with Diflucan. 4) GERD. 5) Hyperlipidemia. 6) Obesity. 7) Cirrhosis of the liver on the CT scan but seems concerning as she has a history of NASH-we are planning to do an  elastography when she has finished her chemotherapy. Juanita Craver 09/07/2021, 3:48 PM

## 2021-09-15 NOTE — ED Triage Notes (Signed)
Pt c/o right abdominal pain, n/v and constipation. Last bm 4 days ago. Tried stool softener and enema at home. Cancer patient, last chemo yesterday.

## 2021-09-16 ENCOUNTER — Inpatient Hospital Stay: Payer: BC Managed Care – PPO

## 2021-09-16 DIAGNOSIS — E669 Obesity, unspecified: Secondary | ICD-10-CM

## 2021-09-16 DIAGNOSIS — R109 Unspecified abdominal pain: Secondary | ICD-10-CM

## 2021-09-16 DIAGNOSIS — T451X5A Adverse effect of antineoplastic and immunosuppressive drugs, initial encounter: Secondary | ICD-10-CM | POA: Diagnosis not present

## 2021-09-16 DIAGNOSIS — R1084 Generalized abdominal pain: Secondary | ICD-10-CM

## 2021-09-16 DIAGNOSIS — C50911 Malignant neoplasm of unspecified site of right female breast: Secondary | ICD-10-CM

## 2021-09-16 DIAGNOSIS — K5900 Constipation, unspecified: Secondary | ICD-10-CM

## 2021-09-16 DIAGNOSIS — K5792 Diverticulitis of intestine, part unspecified, without perforation or abscess without bleeding: Secondary | ICD-10-CM

## 2021-09-16 LAB — CBC
HCT: 37.2 % (ref 36.0–46.0)
Hemoglobin: 12.1 g/dL (ref 12.0–15.0)
MCH: 30.8 pg (ref 26.0–34.0)
MCHC: 32.5 g/dL (ref 30.0–36.0)
MCV: 94.7 fL (ref 80.0–100.0)
Platelets: 243 10*3/uL (ref 150–400)
RBC: 3.93 MIL/uL (ref 3.87–5.11)
RDW: 16.1 % — ABNORMAL HIGH (ref 11.5–15.5)
WBC: 10.6 10*3/uL — ABNORMAL HIGH (ref 4.0–10.5)
nRBC: 0 % (ref 0.0–0.2)

## 2021-09-16 LAB — COMPREHENSIVE METABOLIC PANEL
ALT: 23 U/L (ref 0–44)
AST: 27 U/L (ref 15–41)
Albumin: 3.3 g/dL — ABNORMAL LOW (ref 3.5–5.0)
Alkaline Phosphatase: 42 U/L (ref 38–126)
Anion gap: 7 (ref 5–15)
BUN: 23 mg/dL (ref 8–23)
CO2: 24 mmol/L (ref 22–32)
Calcium: 8.5 mg/dL — ABNORMAL LOW (ref 8.9–10.3)
Chloride: 113 mmol/L — ABNORMAL HIGH (ref 98–111)
Creatinine, Ser: 0.7 mg/dL (ref 0.44–1.00)
GFR, Estimated: >60 mL/min (ref >60)
Glucose, Bld: 133 mg/dL — ABNORMAL HIGH (ref 70–99)
Potassium: 4 mmol/L (ref 3.5–5.1)
Sodium: 144 mmol/L (ref 135–145)
Total Bilirubin: 1 mg/dL (ref 0.3–1.2)
Total Protein: 6 g/dL — ABNORMAL LOW (ref 6.5–8.1)

## 2021-09-16 MED ORDER — RESOURCE INSTANT PROTEIN PO PWD PACKET
1.0000 | Freq: Three times a day (TID) | ORAL | Status: DC
  Administered 2021-09-17 – 2021-09-18 (×3): 6 g via ORAL
  Filled 2021-09-16 (×17): qty 6

## 2021-09-16 MED ORDER — BISACODYL 10 MG RE SUPP
10.0000 mg | Freq: Once | RECTAL | Status: AC
  Administered 2021-09-16: 10 mg via RECTAL
  Filled 2021-09-16: qty 1

## 2021-09-16 MED ORDER — TBO-FILGRASTIM 480 MCG/0.8ML ~~LOC~~ SOSY
480.0000 ug | PREFILLED_SYRINGE | Freq: Every day | SUBCUTANEOUS | Status: DC
  Administered 2021-09-16 – 2021-09-20 (×5): 480 ug via SUBCUTANEOUS
  Filled 2021-09-16 (×6): qty 0.8

## 2021-09-16 MED ORDER — POLYETHYLENE GLYCOL 3350 17 G PO PACK
17.0000 g | PACK | Freq: Once | ORAL | Status: AC
  Administered 2021-09-16: 17 g via ORAL
  Filled 2021-09-16: qty 1

## 2021-09-16 MED ORDER — ENSURE ENLIVE PO LIQD
237.0000 mL | Freq: Two times a day (BID) | ORAL | Status: DC
  Administered 2021-09-18 – 2021-09-20 (×3): 237 mL via ORAL

## 2021-09-16 MED ORDER — ADULT MULTIVITAMIN W/MINERALS CH
1.0000 | ORAL_TABLET | Freq: Every day | ORAL | Status: DC
  Administered 2021-09-16 – 2021-09-17 (×2): 1 via ORAL
  Filled 2021-09-16 (×6): qty 1

## 2021-09-16 MED ORDER — SODIUM CHLORIDE 0.9% FLUSH
10.0000 mL | INTRAVENOUS | Status: DC | PRN
  Administered 2021-09-21: 10 mL

## 2021-09-16 MED ORDER — FLUCONAZOLE 100 MG PO TABS
100.0000 mg | ORAL_TABLET | Freq: Every day | ORAL | Status: DC
  Administered 2021-09-16 – 2021-09-21 (×6): 100 mg via ORAL
  Filled 2021-09-16 (×7): qty 1

## 2021-09-16 MED ORDER — FAMOTIDINE 20 MG PO TABS
40.0000 mg | ORAL_TABLET | Freq: Two times a day (BID) | ORAL | Status: DC
  Administered 2021-09-16 – 2021-09-21 (×10): 40 mg via ORAL
  Filled 2021-09-16 (×10): qty 2

## 2021-09-16 MED ORDER — DEXAMETHASONE SODIUM PHOSPHATE 10 MG/ML IJ SOLN
4.0000 mg | Freq: Two times a day (BID) | INTRAMUSCULAR | Status: DC
  Administered 2021-09-16 – 2021-09-22 (×14): 4 mg via INTRAVENOUS
  Filled 2021-09-16 (×15): qty 1

## 2021-09-16 NOTE — Plan of Care (Signed)

## 2021-09-16 NOTE — Progress Notes (Signed)
  Transition of Care [facility]) Screening Note   Patient Details  Name: Christie Thomas Date of Birth: Dec 18, 1955   Transition of Care [facility]) CM/SW Contact:    Vassie Moselle, LCSW Phone Number: 09/16/2021, 1:07 PM    Transition of Care Department [facility]) has reviewed patient and no TOC needs have been identified at this time. We will continue to monitor patient advancement through interdisciplinary progression rounds. If new patient transition needs arise, please place a TOC consult.

## 2021-09-16 NOTE — Progress Notes (Signed)
Initial Nutrition Assessment  DOCUMENTATION CODES:   Obesity unspecified  INTERVENTION:  Encourage adequate PO intake Ensure Enlive po BID, each supplement provides 350 kcal and 20 grams of protein. 1 Beneprotein packet TID with meals, each supplement provides 25 kcal and 6 grams of protein MVI with minerals daily  NUTRITION DIAGNOSIS:   Increased nutrient needs related to cancer and cancer related treatments as evidenced by estimated needs.  GOAL:   Patient will meet greater than or equal to 90% of their needs  MONITOR:   PO intake, Supplement acceptance, Diet advancement, Labs, Weight trends  REASON FOR ASSESSMENT:   Malnutrition Screening Tool    ASSESSMENT:   Pt admitted with abdominal pain d/t diverticulitis/colitis likely related to Taxotere. PMH significant for breast cancer on chemotherapy, diverticulitis, reflux, HLD and asthma.  Food allergies: shellfish  Pt reports constipation after chemotherapy. Last BM 4 days ago, tried stool softener with no improvement. Per Gen Surgery, plans to treat conservatively at this time.   Spoke with pt via phone call to room. She states that she has been trying to eat a little bit on the full liquid diet. She endorses ongoing cramping and abdominal pain given obstruction and constipation. Within the past couple days she has been experiencing a decreased appetite d/t constipation, nausea and vomiting. Prior to this however, she endorses having a good appetite and eating well. She was eating multiple small meals daily including lots of vegetables, fish and chicken breast. She was also drinking Glucerna at home as her husband has diabetes and this was the supplement most easily available, however she has since purchased Ensure and plans to continue to drink these. During her last admission, she was provided with Beneprotein which she has found to be helpful at home to increase her protein intake. Will reorder for this admission.   Meal  completions: 0% x3 recorded meals  Reviewed wt history. It appears she has had a 8.8% wt loss since 05/09 which is significant for time frame.    Medications: decadron, colace, IV pepcid, IV abx, compazine (PRN)  Labs reviewed  NUTRITION - FOCUSED PHYSICAL EXAM: RD working remotely. Deferred to follow up.   Diet Order:   Diet Order             Diet full liquid Room service appropriate? Yes; Fluid consistency: Thin  Diet effective now                   EDUCATION NEEDS:   No education needs have been identified at this time  Skin:  Skin Assessment: Reviewed RN Assessment  Last BM:  6/29 (small)  Height:   Ht Readings from Last 1 Encounters:  09/14/2021 '5\' 4"'$  (1.626 m)   Weight:   Wt Readings from Last 1 Encounters:  09/08/2021 81.6 kg   BMI:  Body mass index is 30.88 kg/m.  Estimated Nutritional Needs:   Kcal:  2000-2200  Protein:  100-115g  Fluid:  >/=2L  Clayborne Dana, RDN, LDN Clinical Nutrition

## 2021-09-16 NOTE — Progress Notes (Signed)
Subjective: Feeling better.  She had a small bowel movement just now.  Objective: Vital signs in last 24 hours: Temp:  [98 F (36.7 C)-99 F (37.2 C)] 98 F (36.7 C) (06/29 1315) Pulse Rate:  [77-102] 102 (06/29 1315) Resp:  [16-18] 17 (06/29 1315) BP: (121-142)/(64-77) 132/77 (06/29 1315) SpO2:  [96 %-99 %] 99 % (06/29 1315) Last BM Date : 08/22/2021  Intake/Output from previous day: 06/28 0701 - 06/29 0700 In: 737 [I.V.:606; IV Piggyback:131] Out: -  Intake/Output this shift: Total I/O In: 220 [P.O.:220] Out: -   General appearance: alert and no distress GI: soft, non-tender; bowel sounds normal; no masses,  no organomegaly  Lab Results: Recent Labs    09/14/21 0830 09/16/2021 0850 09/16/21 0511  WBC 11.1* 17.8* 10.6*  HGB 12.3 13.6 12.1  HCT 37.5 41.6 37.2  PLT 244 315 243   BMET Recent Labs    09/14/21 0830 09/03/2021 0850 09/16/21 0511  NA 142 145 144  K 4.3 4.0 4.0  CL 110 113* 113*  CO2 '22 22 24  '$ GLUCOSE 182* 136* 133*  BUN '17 23 23  '$ CREATININE 0.92 1.01* 0.70  CALCIUM 10.2 9.6 8.5*   LFT Recent Labs    09/16/21 0511  PROT 6.0*  ALBUMIN 3.3*  AST 27  ALT 23  ALKPHOS 42  BILITOT 1.0   PT/INR No results for input(s): "LABPROT", "INR" in the last 72 hours. Hepatitis Panel No results for input(s): "HEPBSAG", "HCVAB", "HEPAIGM", "HEPBIGM" in the last 72 hours. C-Diff No results for input(s): "CDIFFTOX" in the last 72 hours. Fecal Lactopherrin No results for input(s): "FECLLACTOFRN" in the last 72 hours.  Studies/Results: CT ABDOMEN PELVIS WO CONTRAST  Result Date: 09/13/2021 CLINICAL DATA:  Abdominal pain with nausea vomiting and diarrhea in a 66 year old female. History of chemotherapy for breast cancer. * Tracking Code: BO * EXAM: CT ABDOMEN AND PELVIS WITHOUT CONTRAST TECHNIQUE: Multidetector CT imaging of the abdomen and pelvis was performed following the standard protocol without IV contrast. RADIATION DOSE REDUCTION: This exam was  performed according to the departmental dose-optimization program which includes automated exposure control, adjustment of the mA and/or kV according to patient size and/or use of iterative reconstruction technique. COMPARISON:  Aug 01, 2021. FINDINGS: Lower chest: Central venous access device terminates in the caval to atrial junction. Mild basilar atelectasis. Bilateral breast implants. No pericardial effusion. Hepatobiliary: Liver displays a cirrhotic morphology with fissural widening and LEFT lobe hypertrophy. No visible lesion on noncontrast imaging. Post cholecystectomy without gross biliary duct distension. Pancreas: Normal pancreatic contour without signs of inflammation. Spleen: Normal. Adrenals/Urinary Tract: Adrenal glands are normal. Smooth renal contours. Large RIGHT upper pole renal cyst is unchanged and no dedicated imaging follow-up is recommended for this finding or the area of low attenuation in the anterior upper pole LEFT kidney that measures approximately a cm. Urinary bladder is unremarkable. Stomach/Bowel: Moderate distension of the sigmoid filled with formed stool, caliber approaching 6 cm. Adjacent inflammation. Scattered diverticulosis. Upstream colon is moderately dilated filled with liquid stool and shows pericolonic stranding with loss of haustration. No signs of stranding adjacent to the stomach. No small bowel dilation. Appendix is normal. Vascular/Lymphatic: Aortic atherosclerosis. No sign of aneurysm. Smooth contour of the IVC. There is no gastrohepatic or hepatoduodenal ligament lymphadenopathy. No retroperitoneal or mesenteric lymphadenopathy. No pelvic sidewall lymphadenopathy. Small retroperitoneal lymph nodes less than a cm scattered throughout the retroperitoneum without change. Also vascular assessment limited by lack of intravenous contrast. Reproductive: Unremarkable by CT aside from calcified  fibroid in the posterior uterus. Other: No ascites. Stranding about the descending  colon. No free air. Musculoskeletal: No acute bone finding. No destructive bone process. Spinal degenerative changes. IMPRESSION: 1. Formed stool in the sigmoid and descending colon is slightly increased and remains associated with moderate colonic distension and pericolonic stranding in the setting of diverticular changes. There is now diffuse colitis with ahaustral appearance of the more proximal colon which is moderately distended suggesting developing obstruction. Findings may represent diverticulitis or stercoral colitis with formed stool in the distal colon leading to upstream obstruction and diffuse colitis. Would correlate with any signs of C diff colitis as well as with lactate and with any risk factors for colitis related to ongoing therapy. 2. GI and or surgical consultation may be helpful with close follow-up suggested due to presence of diffuse colitis. 3. Liver displays a cirrhotic morphology with fissural widening and LEFT lobe hypertrophy. 4. Post cholecystectomy without gross biliary duct distension. 5. Aortic atherosclerosis. Aortic Atherosclerosis (ICD10-I70.0). Electronically Signed   By: Zetta Bills M.D.   On: 09/01/2021 10:02    Medications: Scheduled:  Chlorhexidine Gluconate Cloth  6 each Topical Daily   dexamethasone (DECADRON) injection  4 mg Intravenous Q12H   diphenhydrAMINE  25 mg Intravenous Once   docusate sodium  200 mg Oral Daily   enoxaparin (LOVENOX) injection  40 mg Subcutaneous QHS   fluconazole  100 mg Oral Daily   Tbo-Filgrastim  480 mcg Subcutaneous q1800   Continuous:  famotidine (PEPCID) IV 20 mg (09/16/21 0956)   piperacillin-tazobactam (ZOSYN)  IV 3.375 g (09/16/21 0556)    Assessment/Plan: 1) Constipation. 2) Colitis. 3) Leukocytosis. 4) Breast cancer.   The patient is well clinically.  It appears that she is improving as her symptoms of nausea and vomiting are under control with medication.  Also the laxatives as well as manual massage of her  abdomen is helping with her bowel movements.  Plan: 1) Continue with Zosyn. 2) Continue with laxatives.  LOS: 1 day   Ronie Fleeger D 09/16/2021, 1:17 PM

## 2021-09-16 NOTE — Consult Note (Signed)
Referral MD  Reason for Referral: Recurrent diverticulitis-likely Taxotere induced  Chief Complaint  Patient presents with   Abdominal Pain  : I was having more problems with abdominal pain and constipation.  HPI: Christie Thomas is well-known to me.  She is a very nice 66 year old white female.  She has history of stage Ib lobular carcinoma of the right breast.  Because of a high Oncotype score, we have her on adjuvant therapy.  She has had 3 cycles of Taxotere/Cytoxan.  She received her last cycle on Tuesday.  That was her third cycle.  Yesterday, she began to have more abdominal pain.  She again she has had issues with constipation.  She has had abdominal cramping.  She was given some lactulose.  However, this really did not help with respect to the constipation.  Patient ultimately ended up in the emergency room.  She had a CT scan that was done which showed some formed stool in the sigmoid and descending colon with moderate distention and pericolonic stranding.  This was consistent with diverticulitis.  There is a description of diffuse colitis in the proximal colon.  She was admitted.  She was started on Zosyn.  She has been seen by Dr. Collene Mares of Gastroenterology.  Thankfully, her white blood cell count is not low.  She was supposed to get her Neulasta in the office today.  I need to go ahead and give her Neupogen in the hospital.  She had no fever.  Is been no obvious bleeding.  She has had some vomiting.  There has been no cough.  Her labs today show white cell count of 10.6.  Hemoglobin 12.1.  Platelet count 243,000.  BUN is 23 creatinine 0.7.  Her glucose of 133.  Albumin is 3.3.  We have found a couple reports in which Taxotere can cause colitis.  Again, the timing I think would be his appropriate.  I know she had problems after the first cycle of treatment.  She had no issues with the second cycle of treatment.  She is not can have any additional chemotherapy.  She has early stage  disease.  She had a high Oncotype score.  Her tumor is estrogen positive.  As such, I think that we will still get a very high cure rate with 3 cycles of chemo, not 4 cycles.  She will have radiation therapy and antiestrogen therapy.  She actually looks pretty good.  She is passing some flatus.  Overall, I would say her performance status is probably ECOG 1.    Past Medical History:  Diagnosis Date   Anxiety 01/20/2016   Asthma    Cancer ([place]) 02/09/2021   right breast   Diverticulitis 01/20/2016   Diverticulosis 01/20/2016   Epilepsy ([place]) 01/20/2016   Adolescent    Fatty liver 01/20/2016   GERD (gastroesophageal reflux disease)    Hay fever    Hyperlipidemia    IBS (irritable bowel syndrome) 01/20/2016   Obesity 01/20/2016   OSA (obstructive sleep apnea) 09/07/2017   Osteoarthritis of both hands 01/20/2016   PONV (postoperative nausea and vomiting)    Swelling of ankle joint, left 01/20/2016   Trochanteric bursitis 01/20/2016  :   Past Surgical History:  Procedure Laterality Date   AUGMENTATION MAMMAPLASTY     BACK SURGERY     x2   BREAST BIOPSY     fatty tumor   BREAST ENHANCEMENT SURGERY     BREAST LUMPECTOMY WITH RADIOACTIVE SEED AND SENTINEL LYMPH NODE BIOPSY Right  05/17/2021   Procedure: RIGHT BREAST LUMPECTOMY WITH RADIOACTIVE SEED;  Surgeon: Jovita Kussmaul, MD;  Location: [facility];  Service: General;  Laterality: Right;   BREAST RECONSTRUCTION Bilateral 05/17/2021   Procedure: BREAST RECONSTRUCTION REMOVAL IMPLANTS AND REPLACEMENT OF IMPLANTS;  Surgeon: Wallace Going, DO;  Location: [facility];  Service: Plastics;  Laterality: Bilateral;   BREAST REDUCTION SURGERY Bilateral 05/17/2021   Procedure: ONCOPLASTIC BREAST REDUCTION;  Surgeon: Wallace Going, DO;  Location: [facility];  Service: Plastics;  Laterality: Bilateral;   CARDIAC CATHETERIZATION  07/2010   CARPAL TUNNEL RELEASE     CATARACT EXTRACTION Bilateral    2023   CESAREAN SECTION     CHOLECYSTECTOMY      HAND SURGERY     carpal tunnel release, cyst excision   PORTACATH PLACEMENT N/A 07/26/2021   Procedure: INSERTION PORT-A-CATH;  Surgeon: Jovita Kussmaul, MD;  Location: [facility];  Service: General;  Laterality: N/A;   SENTINEL NODE BIOPSY Right 05/17/2021   Procedure: RIGHT SENTINEL LYMPH NODE BIOPSY WITH MAGTRACE INJECTION;  Surgeon: Jovita Kussmaul, MD;  Location: [facility];  Service: General;  Laterality: Right;   SHOULDER SURGERY     right   TONSILLECTOMY AND ADENOIDECTOMY    :   Current Facility-Administered Medications:    Chlorhexidine Gluconate Cloth 2 % PADS 6 each, 6 each, Topical, Daily, Kaur, Rupinder, RN   dexamethasone (DECADRON) injection 8 mg, 8 mg, Intravenous, Q12H, Lorelei Pont, MD, 8 mg at 08/28/2021 1936   diphenhydrAMINE (BENADRYL) injection 25 mg, 25 mg, Intravenous, Once, Lorelei Pont, MD   docusate sodium (COLACE) capsule 200 mg, 200 mg, Oral, Daily, Juanita Craver, MD, 200 mg at 09/04/2021 1936   enoxaparin (LOVENOX) injection 40 mg, 40 mg, Subcutaneous, QHS, Ulyess Blossom C, MD, 40 mg at 09/16/2021 2125   famotidine (PEPCID) IVPB 20 mg premix, 20 mg, Intravenous, Q12H, Lorelei Pont, MD, Last Rate: 100 mL/hr at 08/20/2021 2127, 20 mg at 09/04/2021 2127   lactated ringers infusion, , Intravenous, Continuous, Lorelei Pont, MD, Last Rate: 75 mL/hr at 09/16/21 0243, Restarted at 09/16/21 0243   piperacillin-tazobactam (ZOSYN) IVPB 3.375 g, 3.375 g, Intravenous, Q8H, Lorelei Pont, MD, Last Rate: 12.5 mL/hr at 09/16/21 0556, 3.375 g at 09/16/21 0556   prochlorperazine (COMPAZINE) injection 10 mg, 10 mg, Intravenous, Q4H PRN, Lorelei Pont, MD, 10 mg at 09/16/21 5638   sodium chloride flush (NS) 0.9 % injection 10-40 mL, 10-40 mL, Intracatheter, PRN, Volanda Napoleon, MD   traMADol Veatrice Bourbon) tablet 50 mg, 50 mg, Oral, Q6H PRN, Lorelei Pont, MD, 50 mg at 09/16/21 7564:   Chlorhexidine Gluconate Cloth  6 each Topical Daily   dexamethasone (DECADRON) injection  8 mg Intravenous Q12H    diphenhydrAMINE  25 mg Intravenous Once   docusate sodium  200 mg Oral Daily   enoxaparin (LOVENOX) injection  40 mg Subcutaneous QHS  :   Allergies  Allergen Reactions   Shellfish Allergy Anaphylaxis   Cefepime Swelling    Lips swelling and numbness; chest tightness   Cephalexin     Other reaction(s): Unknown   Ciprofloxacin     Other reaction(s): Unknown   Codeine Other (See Comments)    headache   Metoclopramide Other (See Comments)   Latex Rash    Skin appears to be burned  :   Family History  Problem Relation Age of Onset   Stroke Mother    Hypertension Mother    Hyperlipidemia Mother  Diabetes Mother    Cancer Mother    Sudden death Paternal Grandfather 80   Alzheimer's disease Father    Heart attack Neg Hx   :   Social History   Socioeconomic History   Marital status: Married    Spouse name: Not on file   Number of children: 1   Years of education: Not on file   Highest education level: Not on file  Occupational History   Not on file  Tobacco Use   Smoking status: Never   Smokeless tobacco: Never  Vaping Use   Vaping Use: Never used  Substance and Sexual Activity   Alcohol use: Not Currently   Drug use: No   Sexual activity: Not Currently    Birth control/protection: Post-menopausal  Other Topics Concern   Not on file  Social History Narrative   Not on file   Social Determinants of Health   Financial Resource Strain: Not on file  Food Insecurity: Not on file  Transportation Needs: Not on file  Physical Activity: Not on file  Stress: Not on file  Social Connections: Not on file  Intimate Partner Violence: Not on file  :  Review of Systems  Constitutional: Negative.   HENT: Negative.    Eyes: Negative.   Respiratory: Negative.    Cardiovascular: Negative.   Gastrointestinal:  Positive for abdominal pain, constipation and vomiting.  Genitourinary: Negative.   Musculoskeletal: Negative.   Skin: Negative.   Neurological:  Negative.   Endo/Heme/Allergies: Negative.   Psychiatric/Behavioral: Negative.       Exam: Patient Vitals for the past 24 hrs:  BP Temp Temp src Pulse Resp SpO2 Height Weight  09/16/21 0505 130/68 98.5 F (36.9 C) -- 77 18 98 % -- --  09/16/21 0027 121/64 98.8 F (37.1 C) -- 92 18 96 % -- --  09/01/2021 1946 136/68 99 F (37.2 C) -- 94 17 97 % -- --  08/25/2021 1648 134/71 98.4 F (36.9 C) Oral 88 17 98 % '5\' 4"'$  (1.626 m) --  08/28/2021 1530 131/72 -- -- 85 16 96 % -- --  08/31/2021 1500 129/68 -- -- 84 -- 97 % -- --  09/12/2021 1430 (!) 142/77 -- -- 94 -- 97 % -- --  09/12/2021 1300 135/72 -- -- 87 -- 98 % -- --  09/07/2021 1130 131/72 -- -- 81 16 96 % -- --  08/23/2021 1030 135/74 -- -- 85 16 100 % -- --  09/09/2021 0943 138/76 -- -- 86 16 99 % -- --  08/25/2021 0852 (!) 148/85 -- -- 99 16 99 % -- --  08/20/2021 0728 -- -- -- -- -- -- '5\' 4"'$  (1.626 m) 179 lb 14.3 oz (81.6 kg)  08/25/2021 0727 (!) 150/87 98.8 F (37.1 C) Oral (!) 103 15 98 % -- --   Physical Exam Vitals reviewed.  HENT:     Head: Normocephalic and atraumatic.  Eyes:     Pupils: Pupils are equal, round, and reactive to light.  Cardiovascular:     Rate and Rhythm: Normal rate and regular rhythm.     Heart sounds: Normal heart sounds.  Pulmonary:     Effort: Pulmonary effort is normal.     Breath sounds: Normal breath sounds.  Abdominal:     General: Bowel sounds are normal.     Palpations: Abdomen is soft.  Musculoskeletal:        General: No tenderness or deformity. Normal range of motion.     Cervical back: Normal  range of motion.  Lymphadenopathy:     Cervical: No cervical adenopathy.  Skin:    General: Skin is warm and dry.     Findings: No erythema or rash.  Neurological:     Mental Status: She is alert and oriented to person, place, and time.  Psychiatric:        Behavior: Behavior normal.        Thought Content: Thought content normal.        Judgment: Judgment normal.       Recent Labs    09/10/2021 0850  09/16/21 0511  WBC 17.8* 10.6*  HGB 13.6 12.1  HCT 41.6 37.2  PLT 315 243    Recent Labs    09/13/2021 0850 09/16/21 0511  NA 145 144  K 4.0 4.0  CL 113* 113*  CO2 22 24  GLUCOSE 136* 133*  BUN 23 23  CREATININE 1.01* 0.70  CALCIUM 9.6 8.5*    Blood smear review: None  Pathology: None    Assessment and Plan: Christie Thomas is a very nice 66 year old white female.  She has stage Ib ductal carcinoma of the right breast.  She is on adjuvant chemotherapy.  Again, I do believe that this diverticulitis/colitis is somehow related to the Taxotere.  Again it is a very rare side effect of treatment with Taxotere.  However, given that she has had this again, I would have to worry that the Taxotere is a factor.  I told her that we will no longer give her chemotherapy.  I think 3 out of 4 cycles of treatment is appropriate since she does have a early stage disease.  The goal now is to get her through this colitis.  I know that Dr. Collene Mares is doing a great job and she will help out quite a bit.  Christie Thomas will need Neupogen.  Her white cell count will go down.  I want to try to decrease the length of time that the white cell count is down.  I will also put on little bit of Diflucan.  Hopefully, things will start improving.  Hopeful that she will be able to go home, maybe, over the weekend.  I know that she will get incredible care from all the staff on [address].  Lattie Haw, MD  Darlyn Chamber 17:14

## 2021-09-16 NOTE — Progress Notes (Signed)
PROGRESS NOTE    Christie Thomas  XKG:[id number] DOB: 1955-05-05 DOA: 08/25/2021 PCP: Volanda Napoleon, MD    Brief Narrative:  66 year old female with multiple medical issues, recently diagnosed with a stage Ib infiltrating lobular carcinoma of the right breast, status postlumpectomy and currently on adjuvant chemotherapy with Taxotere/Cytoxan presented to the ER with abdominal pain, constipation and cramping.  Hemodynamically stable in the ER.  CT scan of the abdomen pelvis with formed stool in the sigmoid and descending colon, moderate distention and pericolonic stranding.  Some evidence of diffuse colitis, however mostly with hard stool.  Admitted with GI consultation.   Assessment & Plan:   Diffuse abdominal pain Significant constipation and palpable stool in colon Diffuse colitis that is uncomplicated, suspected chemotherapy-induced mucosal inflammation.  Her abdominal pain is likely related to constipation.  She does have significant palpable stool in her colon.  Patient is hesitant to use aggressive bowel regimen.  She is only using stool softener. -Nausea has improved, will start on full liquid diet. -Recommended her to use laxatives, patient is hesitant to use any laxatives.  1 dose of Dulcolax suppository and 1 dose of MiraLAX now.  Followed by gastroenterology, may need more aggressive bowel regimen.  Will defer to them. -Reasonable to continue IV Zosyn, however bacterial infection is less likely. -On IV steroids for chemo-induced nausea.  Improved now.  Breast cancer currently receiving chemotherapy, followed by her oncologist.  Receiving Neupogen.  Hyperlipidemia: Hold statin and fenofibrate until GI symptoms improved.  Anxiety: Resume Klonopin at night.  Advance to full liquid diet.  Mobilize in the hallway.    DVT prophylaxis: enoxaparin (LOVENOX) injection 40 mg Start: 09/02/2021 2200 SCDs Start: 08/26/2021 1917   Code Status: Full code Family Communication:  None Disposition Plan: Status is: Inpatient Remains inpatient appropriate because: Significant constipation, intolerance to diet, IV antibiotics and IV fluids     Consultants:  Gastroenterology Oncology  Procedures:  None  Antimicrobials:  Zosyn and Diflucan 6/28---   Subjective: Patient seen and examined.  She was admitted yesterday and since admission she has only used 1 dose of Colace.  She had one small volume hard stool none since then.  She can palpate her stool on her left side of the belly.  Denies any nausea vomiting today.  Wants to try eating.  She is hesitant to use laxatives because when she took lactulose, it caused bout of abdominal rumbling and discomfort. She agreed to use 1 dose of suppository and 1 dose of MiraLAX and wants to wait to talk to Dr Collene Mares before doing more aggressive laxatives.  Objective: Vitals:   09/06/2021 1648 09/16/2021 1946 09/16/21 0027 09/16/21 0505  BP: 134/71 136/68 121/64 130/68  Pulse: 88 94 92 77  Resp: '17 17 18 18  '$ Temp: 98.4 F (36.9 C) 99 F (37.2 C) 98.8 F (37.1 C) 98.5 F (36.9 C)  TempSrc: Oral     SpO2: 98% 97% 96% 98%  Weight:      Height: '5\' 4"'$  (1.626 m)       Intake/Output Summary (Last 24 hours) at 09/16/2021 1305 Last data filed at 09/16/2021 1202 Gross per 24 hour  Intake 956.99 ml  Output --  Net 956.99 ml   Filed Weights   08/19/2021 0728  Weight: 81.6 kg    Examination:  General exam: Appears calm and comfortable  Slightly anxious appropriately. Respiratory system: Clear to auscultation. Respiratory effort normal. Port-A-Cath present right chest. Cardiovascular system: S1 & S2 heard, RRR.  Gastrointestinal system: Soft.  Nontender.  Palpable stool filled colon on the left lateral quadrant.  No localized tenderness, no rigidity, no guarding. Central nervous system: Alert and oriented. No focal neurological deficits.     Data Reviewed: I have personally reviewed following labs and imaging  studies  CBC: Recent Labs  Lab 09/14/21 0830 09/11/2021 0850 09/16/21 0511  WBC 11.1* 17.8* 10.6*  NEUTROABS 9.6* 15.7*  --   HGB 12.3 13.6 12.1  HCT 37.5 41.6 37.2  MCV 93.5 92.9 94.7  PLT 244 315 643   Basic Metabolic Panel: Recent Labs  Lab 09/14/21 0830 09/06/2021 0850 09/16/21 0511  NA 142 145 144  K 4.3 4.0 4.0  CL 110 113* 113*  CO2 '22 22 24  '$ GLUCOSE 182* 136* 133*  BUN '17 23 23  '$ CREATININE 0.92 1.01* 0.70  CALCIUM 10.2 9.6 8.5*   GFR: Estimated Creatinine Clearance: 71.5 mL/min (by C-G formula based on SCr of 0.7 mg/dL). Liver Function Tests: Recent Labs  Lab 09/14/21 0830 08/28/2021 0850 09/16/21 0511  AST 31 34 27  ALT '21 25 23  '$ ALKPHOS 65 56 42  BILITOT 0.7 0.9 1.0  PROT 7.1 7.2 6.0*  ALBUMIN 4.3 4.1 3.3*   Recent Labs  Lab 09/08/2021 0850  LIPASE 45   No results for input(s): "AMMONIA" in the last 168 hours. Coagulation Profile: No results for input(s): "INR", "PROTIME" in the last 168 hours. Cardiac Enzymes: No results for input(s): "CKTOTAL", "CKMB", "CKMBINDEX", "TROPONINI" in the last 168 hours. BNP (last 3 results) No results for input(s): "PROBNP" in the last 8760 hours. HbA1C: No results for input(s): "HGBA1C" in the last 72 hours. CBG: No results for input(s): "GLUCAP" in the last 168 hours. Lipid Profile: No results for input(s): "CHOL", "HDL", "LDLCALC", "TRIG", "CHOLHDL", "LDLDIRECT" in the last 72 hours. Thyroid Function Tests: No results for input(s): "TSH", "T4TOTAL", "FREET4", "T3FREE", "THYROIDAB" in the last 72 hours. Anemia Panel: No results for input(s): "VITAMINB12", "FOLATE", "FERRITIN", "TIBC", "IRON", "RETICCTPCT" in the last 72 hours. Sepsis Labs: No results for input(s): "PROCALCITON", "LATICACIDVEN" in the last 168 hours.  No results found for this or any previous visit (from the past 240 hour(s)).       Radiology Studies: CT ABDOMEN PELVIS WO CONTRAST  Result Date: 08/26/2021 CLINICAL DATA:  Abdominal pain  with nausea vomiting and diarrhea in a 66 year old female. History of chemotherapy for breast cancer. * Tracking Code: BO * EXAM: CT ABDOMEN AND PELVIS WITHOUT CONTRAST TECHNIQUE: Multidetector CT imaging of the abdomen and pelvis was performed following the standard protocol without IV contrast. RADIATION DOSE REDUCTION: This exam was performed according to the departmental dose-optimization program which includes automated exposure control, adjustment of the mA and/or kV according to patient size and/or use of iterative reconstruction technique. COMPARISON:  Aug 01, 2021. FINDINGS: Lower chest: Central venous access device terminates in the caval to atrial junction. Mild basilar atelectasis. Bilateral breast implants. No pericardial effusion. Hepatobiliary: Liver displays a cirrhotic morphology with fissural widening and LEFT lobe hypertrophy. No visible lesion on noncontrast imaging. Post cholecystectomy without gross biliary duct distension. Pancreas: Normal pancreatic contour without signs of inflammation. Spleen: Normal. Adrenals/Urinary Tract: Adrenal glands are normal. Smooth renal contours. Large RIGHT upper pole renal cyst is unchanged and no dedicated imaging follow-up is recommended for this finding or the area of low attenuation in the anterior upper pole LEFT kidney that measures approximately a cm. Urinary bladder is unremarkable. Stomach/Bowel: Moderate distension of the sigmoid filled with formed stool, caliber  approaching 6 cm. Adjacent inflammation. Scattered diverticulosis. Upstream colon is moderately dilated filled with liquid stool and shows pericolonic stranding with loss of haustration. No signs of stranding adjacent to the stomach. No small bowel dilation. Appendix is normal. Vascular/Lymphatic: Aortic atherosclerosis. No sign of aneurysm. Smooth contour of the IVC. There is no gastrohepatic or hepatoduodenal ligament lymphadenopathy. No retroperitoneal or mesenteric lymphadenopathy. No  pelvic sidewall lymphadenopathy. Small retroperitoneal lymph nodes less than a cm scattered throughout the retroperitoneum without change. Also vascular assessment limited by lack of intravenous contrast. Reproductive: Unremarkable by CT aside from calcified fibroid in the posterior uterus. Other: No ascites. Stranding about the descending colon. No free air. Musculoskeletal: No acute bone finding. No destructive bone process. Spinal degenerative changes. IMPRESSION: 1. Formed stool in the sigmoid and descending colon is slightly increased and remains associated with moderate colonic distension and pericolonic stranding in the setting of diverticular changes. There is now diffuse colitis with ahaustral appearance of the more proximal colon which is moderately distended suggesting developing obstruction. Findings may represent diverticulitis or stercoral colitis with formed stool in the distal colon leading to upstream obstruction and diffuse colitis. Would correlate with any signs of C diff colitis as well as with lactate and with any risk factors for colitis related to ongoing therapy. 2. GI and or surgical consultation may be helpful with close follow-up suggested due to presence of diffuse colitis. 3. Liver displays a cirrhotic morphology with fissural widening and LEFT lobe hypertrophy. 4. Post cholecystectomy without gross biliary duct distension. 5. Aortic atherosclerosis. Aortic Atherosclerosis (ICD10-I70.0). Electronically Signed   By: Zetta Bills M.D.   On: 08/28/2021 10:02        Scheduled Meds:  Chlorhexidine Gluconate Cloth  6 each Topical Daily   dexamethasone (DECADRON) injection  4 mg Intravenous Q12H   diphenhydrAMINE  25 mg Intravenous Once   docusate sodium  200 mg Oral Daily   enoxaparin (LOVENOX) injection  40 mg Subcutaneous QHS   fluconazole  100 mg Oral Daily   Tbo-Filgrastim  480 mcg Subcutaneous q1800   Continuous Infusions:  famotidine (PEPCID) IV 20 mg (09/16/21 0956)    piperacillin-tazobactam (ZOSYN)  IV 3.375 g (09/16/21 0556)     LOS: 1 day    Time spent: 35 minutes    Barb Merino, MD [facility] Pager [phone]

## 2021-09-17 ENCOUNTER — Inpatient Hospital Stay (HOSPITAL_COMMUNITY): Payer: BC Managed Care – PPO

## 2021-09-17 ENCOUNTER — Inpatient Hospital Stay: Payer: BC Managed Care – PPO

## 2021-09-17 DIAGNOSIS — K5792 Diverticulitis of intestine, part unspecified, without perforation or abscess without bleeding: Secondary | ICD-10-CM | POA: Diagnosis not present

## 2021-09-17 DIAGNOSIS — R109 Unspecified abdominal pain: Secondary | ICD-10-CM | POA: Diagnosis not present

## 2021-09-17 DIAGNOSIS — R1084 Generalized abdominal pain: Secondary | ICD-10-CM | POA: Diagnosis not present

## 2021-09-17 DIAGNOSIS — C50911 Malignant neoplasm of unspecified site of right female breast: Secondary | ICD-10-CM | POA: Diagnosis not present

## 2021-09-17 DIAGNOSIS — T451X5A Adverse effect of antineoplastic and immunosuppressive drugs, initial encounter: Secondary | ICD-10-CM | POA: Diagnosis not present

## 2021-09-17 LAB — COMPREHENSIVE METABOLIC PANEL
ALT: 21 U/L (ref 0–44)
AST: 27 U/L (ref 15–41)
Albumin: 3.4 g/dL — ABNORMAL LOW (ref 3.5–5.0)
Alkaline Phosphatase: 50 U/L (ref 38–126)
Anion gap: 7 (ref 5–15)
BUN: 19 mg/dL (ref 8–23)
CO2: 24 mmol/L (ref 22–32)
Calcium: 8.7 mg/dL — ABNORMAL LOW (ref 8.9–10.3)
Chloride: 111 mmol/L (ref 98–111)
Creatinine, Ser: 0.6 mg/dL (ref 0.44–1.00)
GFR, Estimated: >60 mL/min (ref >60)
Glucose, Bld: 121 mg/dL — ABNORMAL HIGH (ref 70–99)
Potassium: 3.8 mmol/L (ref 3.5–5.1)
Sodium: 142 mmol/L (ref 135–145)
Total Bilirubin: 1.5 mg/dL — ABNORMAL HIGH (ref 0.3–1.2)
Total Protein: 6.1 g/dL — ABNORMAL LOW (ref 6.5–8.1)

## 2021-09-17 LAB — CBC WITH DIFFERENTIAL/PLATELET
Abs Immature Granulocytes: 0.59 10*3/uL — ABNORMAL HIGH (ref 0.00–0.07)
Basophils Absolute: 0.1 10*3/uL (ref 0.0–0.1)
Basophils Relative: 1 %
Eosinophils Absolute: 0.1 10*3/uL (ref 0.0–0.5)
Eosinophils Relative: 1 %
HCT: 37 % (ref 36.0–46.0)
Hemoglobin: 12 g/dL (ref 12.0–15.0)
Immature Granulocytes: 4 %
Lymphocytes Relative: 4 %
Lymphs Abs: 0.5 10*3/uL — ABNORMAL LOW (ref 0.7–4.0)
MCH: 30.7 pg (ref 26.0–34.0)
MCHC: 32.4 g/dL (ref 30.0–36.0)
MCV: 94.6 fL (ref 80.0–100.0)
Monocytes Absolute: 0.1 10*3/uL (ref 0.1–1.0)
Monocytes Relative: 1 %
Neutro Abs: 14 10*3/uL — ABNORMAL HIGH (ref 1.7–7.7)
Neutrophils Relative %: 89 %
Platelets: 180 10*3/uL (ref 150–400)
RBC: 3.91 MIL/uL (ref 3.87–5.11)
RDW: 15.8 % — ABNORMAL HIGH (ref 11.5–15.5)
WBC: 15.3 10*3/uL — ABNORMAL HIGH (ref 4.0–10.5)
nRBC: 0 % (ref 0.0–0.2)

## 2021-09-17 MED ORDER — FLEET ENEMA 7-19 GM/118ML RE ENEM
1.0000 | ENEMA | Freq: Once | RECTAL | Status: DC
  Filled 2021-09-17: qty 1

## 2021-09-17 MED ORDER — FENTANYL CITRATE PF 50 MCG/ML IJ SOSY
25.0000 ug | PREFILLED_SYRINGE | Freq: Once | INTRAMUSCULAR | Status: AC | PRN
  Administered 2021-09-17: 25 ug via INTRAVENOUS
  Filled 2021-09-17: qty 1

## 2021-09-17 MED ORDER — SODIUM CHLORIDE 0.9 % IV SOLN: INTRAVENOUS | Status: DC

## 2021-09-17 MED ORDER — ACETAMINOPHEN 325 MG PO TABS
650.0000 mg | ORAL_TABLET | Freq: Four times a day (QID) | ORAL | Status: DC | PRN
  Administered 2021-09-17 – 2021-09-21 (×8): 650 mg via ORAL
  Filled 2021-09-17 (×8): qty 2

## 2021-09-17 MED ORDER — BISACODYL 5 MG PO TBEC
5.0000 mg | DELAYED_RELEASE_TABLET | Freq: Once | ORAL | Status: AC | PRN
Start: 2021-09-17 — End: 2021-09-17
  Administered 2021-09-17: 5 mg via ORAL
  Filled 2021-09-17: qty 1

## 2021-09-17 MED ORDER — FENTANYL CITRATE PF 50 MCG/ML IJ SOSY
25.0000 ug | PREFILLED_SYRINGE | INTRAMUSCULAR | Status: DC | PRN
  Administered 2021-09-17: 25 ug via INTRAVENOUS
  Filled 2021-09-17: qty 1

## 2021-09-17 MED ORDER — BUDESONIDE 0.25 MG/2ML IN SUSP
0.2500 mg | Freq: Two times a day (BID) | RESPIRATORY_TRACT | Status: DC
  Administered 2021-09-18 – 2021-09-22 (×9): 0.25 mg via RESPIRATORY_TRACT
  Filled 2021-09-17 (×10): qty 2

## 2021-09-17 MED ORDER — POLYETHYLENE GLYCOL 3350 17 G PO PACK
17.0000 g | PACK | Freq: Once | ORAL | Status: DC
  Filled 2021-09-17: qty 1

## 2021-09-17 MED ORDER — FENTANYL CITRATE PF 50 MCG/ML IJ SOSY
25.0000 ug | PREFILLED_SYRINGE | INTRAMUSCULAR | Status: DC | PRN
  Administered 2021-09-17 – 2021-09-21 (×8): 25 ug via INTRAVENOUS
  Filled 2021-09-17 (×9): qty 1

## 2021-09-17 NOTE — Progress Notes (Signed)
Christie Thomas had a lot of problems last night.  She is having a lot of abdominal pain.  Apparently, she is still constipated.  I am still surprised by this.  I know she is on laxatives.  She says whenever she takes laxatives, this is what happens with respect to pain.  Her labs do not look all that bad.  Her sodium is 142.  Potassium 3.8.  BUN 19 creatinine 0.6.  Calcium 8.7 with an albumin of 3.4.  Her white cell count is 15.3.  Hemoglobin 12.  Platelet count 180,000.  Her vital signs show temperature of 98.6.  Pulse 94.  Blood pressure 131/65.  There is been no obvious bleeding.  She had little bit of vomiting.  The pain is in the left lower quadrant.  Again, she says she is constipated.  I am just surprised that she is having such a hard time going to the bathroom.  I would not think that she is obstructed.  However, this is always a possibility.  We may have to get a abdominal film on her to make sure there is no obstruction.  I would probably try to hold off on an enema just because of the diverticulitis.  Again, I had to believe that this colitis/diverticulitis is somehow related to the Taxotere that she had.  There are very rare reports of Taxotere causing colitis.  She is on antibiotics.  We will continue to follow along and try to help out anyway if possible.  Lattie Haw, MD  Oswaldo Milian 41:19

## 2021-09-17 NOTE — Progress Notes (Signed)
PROGRESS NOTE    Christie Thomas  GMW:[id number] DOB: [birth year] DOA: 09/09/2021 PCP: Volanda Napoleon, MD    Brief Narrative:  66 year old female with multiple medical issues, recently diagnosed with a stage Ib infiltrating lobular carcinoma of the right breast, status postlumpectomy and currently on adjuvant chemotherapy with Taxotere/Cytoxan presented to the ER with abdominal pain, constipation and cramping.  Hemodynamically stable in the ER.  CT scan of the abdomen pelvis with formed stool in the sigmoid and descending colon, moderate distention and pericolonic stranding.  Some evidence of diffuse colitis, however mostly with hard stool.  Admitted with GI consultation.   Assessment & Plan:   Diffuse abdominal pain Significant constipation and palpable stool in colon Diffuse colitis that is uncomplicated, suspected chemotherapy-induced mucosal inflammation.  Still with significant abdominal pain.  Significant constipation. KUB ordered and reviewed, no evidence of complication. Hesitant to use laxatives, a dose of MiraLAX and Dulcolax suppository 6/29 with no meaningful results. She will need more aggressive bowel regimen, she would like to discuss this with gastroenterology. Started on Zosyn, however bacterial infection is less likely. On IV steroids for chemo-induced nausea.  Nausea has been improved now. Seen with abdominal pain, currently needing IV and oral opiates for pain control.  Breast cancer currently receiving chemotherapy, followed by her oncologist.  Receiving Neupogen.  Hyperlipidemia: Hold statin and fenofibrate until GI symptoms improved.  Anxiety: Resume Klonopin at night.  Keep on full liquid diet.  Ambulate in the hallway. We will defer to GI about more aggressive laxatives/bowel prep.   DVT prophylaxis: enoxaparin (LOVENOX) injection 40 mg Start: 09/14/2021 2200 SCDs Start: 08/25/2021 1917   Code Status: Full code Family Communication:  None Disposition Plan: Status is: Inpatient Remains inpatient appropriate because: Significant constipation, intolerance to diet, IV antibiotics and IV fluids     Consultants:  Gastroenterology Oncology  Procedures:  None  Antimicrobials:  Zosyn and Diflucan 6/28---   Subjective:  Patient seen and examined.  Overnight had episodes of severe diffuse pain.  Occasional nausea but no vomiting.  Taking clear liquid diet.  No BM since yesterday morning.  Needing fentanyl for pain relief.  Objective: Vitals:   09/16/21 0027 09/16/21 0505 09/16/21 1315 09/16/21 1950  BP: 121/64 130/68 132/77 131/65  Pulse: 92 77 (!) 102 94  Resp: '18 18 17 18  '$ Temp: 98.8 F (37.1 C) 98.5 F (36.9 C) 98 F (36.7 C) 98.6 F (37 C)  TempSrc:   Oral   SpO2: 96% 98% 99% 97%  Weight:      Height:        Intake/Output Summary (Last 24 hours) at 09/17/2021 1124 Last data filed at 09/17/2021 0813 Gross per 24 hour  Intake 459.04 ml  Output --  Net 459.04 ml   Filed Weights   08/21/2021 0728  Weight: 81.6 kg    Examination:  General exam: Appears calm and comfortable, anxious.  In mild distress during pain. Respiratory system: Clear to auscultation. Respiratory effort normal. Port-A-Cath present left chest wall. Cardiovascular system: S1 & S2 heard, RRR.  Gastrointestinal system: Soft.  Mild diffuse tenderness.  No rigidity or guarding.  Bowel sound present. Central nervous system: Alert and oriented. No focal neurological deficits.     Data Reviewed: I have personally reviewed following labs and imaging studies  CBC: Recent Labs  Lab 09/14/21 0830 08/23/2021 0850 09/16/21 0511 09/17/21 0450  WBC 11.1* 17.8* 10.6* 15.3*  NEUTROABS 9.6* 15.7*  --  14.0*  HGB 12.3 13.6 12.1 12.0  HCT 37.5 41.6 37.2 37.0  MCV 93.5 92.9 94.7 94.6  PLT 244 315 243 846   Basic Metabolic Panel: Recent Labs  Lab 09/14/21 0830 09/12/2021 0850 09/16/21 0511 09/17/21 0450  NA 142 145 144 142  K 4.3 4.0  4.0 3.8  CL 110 113* 113* 111  CO2 '22 22 24 24  '$ GLUCOSE 182* 136* 133* 121*  BUN '17 23 23 19  '$ CREATININE 0.92 1.01* 0.70 0.60  CALCIUM 10.2 9.6 8.5* 8.7*   GFR: Estimated Creatinine Clearance: 71.5 mL/min (by C-G formula based on SCr of 0.6 mg/dL). Liver Function Tests: Recent Labs  Lab 09/14/21 0830 08/29/2021 0850 09/16/21 0511 09/17/21 0450  AST 31 34 27 27  ALT '21 25 23 21  '$ ALKPHOS 65 56 42 50  BILITOT 0.7 0.9 1.0 1.5*  PROT 7.1 7.2 6.0* 6.1*  ALBUMIN 4.3 4.1 3.3* 3.4*   Recent Labs  Lab 08/21/2021 0850  LIPASE 45   No results for input(s): "AMMONIA" in the last 168 hours. Coagulation Profile: No results for input(s): "INR", "PROTIME" in the last 168 hours. Cardiac Enzymes: No results for input(s): "CKTOTAL", "CKMB", "CKMBINDEX", "TROPONINI" in the last 168 hours. BNP (last 3 results) No results for input(s): "PROBNP" in the last 8760 hours. HbA1C: No results for input(s): "HGBA1C" in the last 72 hours. CBG: No results for input(s): "GLUCAP" in the last 168 hours. Lipid Profile: No results for input(s): "CHOL", "HDL", "LDLCALC", "TRIG", "CHOLHDL", "LDLDIRECT" in the last 72 hours. Thyroid Function Tests: No results for input(s): "TSH", "T4TOTAL", "FREET4", "T3FREE", "THYROIDAB" in the last 72 hours. Anemia Panel: No results for input(s): "VITAMINB12", "FOLATE", "FERRITIN", "TIBC", "IRON", "RETICCTPCT" in the last 72 hours. Sepsis Labs: No results for input(s): "PROCALCITON", "LATICACIDVEN" in the last 168 hours.  No results found for this or any previous visit (from the past 240 hour(s)).       Radiology Studies: DG Abd 1 View  Result Date: 09/17/2021 CLINICAL DATA:  Abdominal distension EXAM: ABDOMEN - 1 VIEW COMPARISON:  None Available. FINDINGS: No dilated loops of small bowel. Stool burden is mild. No radiopaque calculi. Cholecystectomy clips. IMPRESSION: Unremarkable bowel gas pattern. Electronically Signed   By: Macy Mis M.D.   On: 09/17/2021  08:18        Scheduled Meds:  Chlorhexidine Gluconate Cloth  6 each Topical Daily   dexamethasone (DECADRON) injection  4 mg Intravenous Q12H   diphenhydrAMINE  25 mg Intravenous Once   docusate sodium  200 mg Oral Daily   enoxaparin (LOVENOX) injection  40 mg Subcutaneous QHS   famotidine  40 mg Oral BID   feeding supplement  237 mL Oral BID BM   fluconazole  100 mg Oral Daily   multivitamin with minerals  1 tablet Oral Daily   protein supplement  1 Scoop Oral TID WC   Tbo-Filgrastim  480 mcg Subcutaneous q1800   Continuous Infusions:  piperacillin-tazobactam (ZOSYN)  IV 12.5 mL/hr at 09/17/21 0823     LOS: 2 days    Time spent: 35 minutes    Barb Merino, MD [facility] Pager [phone]

## 2021-09-17 NOTE — Progress Notes (Signed)
Pt started with severe pain around 430. Got up to try to use bathroom as pt state" feels like its starting to pass". No BM. Pain continues administered ordered tramadol early but within the our range of when to give again. Pt had been up pacing I room, nauseous but refusing medication for that. Tired heat pads did not help. Informed on call. No new order. Continue to monitor.

## 2021-09-17 NOTE — Plan of Care (Signed)

## 2021-09-17 NOTE — Progress Notes (Signed)
Subjective: Constipation, uncomfortable.  Objective: Vital signs in last 24 hours: Temp:  [98 F (36.7 C)-98.6 F (37 C)] 98 F (36.7 C) (06/30 1345) Pulse Rate:  [94] 94 (06/29 1950) Resp:  [17-18] 17 (06/30 1345) BP: (131-139)/(65-80) 139/80 (06/30 1345) SpO2:  [97 %-99 %] 99 % (06/30 1345) Last BM Date : 09/16/21  Intake/Output from previous day: 06/29 0701 - 06/30 0700 In: 730.5 [P.O.:677; IV Piggyback:53.5] Out: -  Intake/Output this shift: No intake/output data recorded.  General appearance: alert, no distress, and uncomfortable GI: soft, some tenderness, but no acute abdomen  Lab Results: Recent Labs    08/24/2021 0850 09/16/21 0511 09/17/21 0450  WBC 17.8* 10.6* 15.3*  HGB 13.6 12.1 12.0  HCT 41.6 37.2 37.0  PLT 315 243 180   BMET Recent Labs    09/13/2021 0850 09/16/21 0511 09/17/21 0450  NA 145 144 142  K 4.0 4.0 3.8  CL 113* 113* 111  CO2 '22 24 24  '$ GLUCOSE 136* 133* 121*  BUN '23 23 19  '$ CREATININE 1.01* 0.70 0.60  CALCIUM 9.6 8.5* 8.7*   LFT Recent Labs    09/17/21 0450  PROT 6.1*  ALBUMIN 3.4*  AST 27  ALT 21  ALKPHOS 50  BILITOT 1.5*   PT/INR No results for input(s): "LABPROT", "INR" in the last 72 hours. Hepatitis Panel No results for input(s): "HEPBSAG", "HCVAB", "HEPAIGM", "HEPBIGM" in the last 72 hours. C-Diff No results for input(s): "CDIFFTOX" in the last 72 hours. Fecal Lactopherrin No results for input(s): "FECLLACTOFRN" in the last 72 hours.  Studies/Results: DG Abd 1 View  Result Date: 09/17/2021 CLINICAL DATA:  Abdominal distension EXAM: ABDOMEN - 1 VIEW COMPARISON:  None Available. FINDINGS: No dilated loops of small bowel. Stool burden is mild. No radiopaque calculi. Cholecystectomy clips. IMPRESSION: Unremarkable bowel gas pattern. Electronically Signed   By: Macy Mis M.D.   On: 09/17/2021 08:18    Medications: Scheduled:  Chlorhexidine Gluconate Cloth  6 each Topical Daily   dexamethasone (DECADRON) injection   4 mg Intravenous Q12H   diphenhydrAMINE  25 mg Intravenous Once   docusate sodium  200 mg Oral Daily   enoxaparin (LOVENOX) injection  40 mg Subcutaneous QHS   famotidine  40 mg Oral BID   feeding supplement  237 mL Oral BID BM   fluconazole  100 mg Oral Daily   multivitamin with minerals  1 tablet Oral Daily   protein supplement  1 Scoop Oral TID WC   Tbo-Filgrastim  480 mcg Subcutaneous q1800   Continuous:  piperacillin-tazobactam (ZOSYN)  IV 12.5 mL/hr at 09/17/21 3086    Assessment/Plan: 1) Constipation. 2) Leukocytosis.   The patient is struggling with having a bowel movement.  The rectal examination was negative for a fecal impaction.  There is no evidence of an acute abdomen with the elevated WBC and her KUB was negative for any obstruction or ileus.  Plan: 1) Agree with enema. 2) Continue with the other laxative regimen.  LOS: 2 days   Ohm Dentler D 09/17/2021, 1:57 PM

## 2021-09-18 DIAGNOSIS — K5901 Slow transit constipation: Secondary | ICD-10-CM

## 2021-09-18 DIAGNOSIS — K5289 Other specified noninfective gastroenteritis and colitis: Secondary | ICD-10-CM | POA: Diagnosis not present

## 2021-09-18 DIAGNOSIS — D72829 Elevated white blood cell count, unspecified: Secondary | ICD-10-CM

## 2021-09-18 DIAGNOSIS — F419 Anxiety disorder, unspecified: Secondary | ICD-10-CM

## 2021-09-18 DIAGNOSIS — T40601A Poisoning by unspecified narcotics, accidental (unintentional), initial encounter: Secondary | ICD-10-CM

## 2021-09-18 DIAGNOSIS — C50211 Malignant neoplasm of upper-inner quadrant of right female breast: Secondary | ICD-10-CM | POA: Diagnosis not present

## 2021-09-18 DIAGNOSIS — R0602 Shortness of breath: Secondary | ICD-10-CM

## 2021-09-18 DIAGNOSIS — R1084 Generalized abdominal pain: Secondary | ICD-10-CM | POA: Diagnosis not present

## 2021-09-18 DIAGNOSIS — K5900 Constipation, unspecified: Secondary | ICD-10-CM

## 2021-09-18 DIAGNOSIS — R000 Tachycardia, unspecified: Secondary | ICD-10-CM

## 2021-09-18 DIAGNOSIS — D701 Agranulocytosis secondary to cancer chemotherapy: Secondary | ICD-10-CM | POA: Diagnosis not present

## 2021-09-18 DIAGNOSIS — K6389 Other specified diseases of intestine: Secondary | ICD-10-CM

## 2021-09-18 DIAGNOSIS — R109 Unspecified abdominal pain: Secondary | ICD-10-CM | POA: Diagnosis not present

## 2021-09-18 DIAGNOSIS — Z9221 Personal history of antineoplastic chemotherapy: Secondary | ICD-10-CM

## 2021-09-18 LAB — CBC WITH DIFFERENTIAL/PLATELET
Abs Immature Granulocytes: 0.1 10*3/uL — ABNORMAL HIGH (ref 0.00–0.07)
Band Neutrophils: 2 %
Basophils Absolute: 0 10*3/uL (ref 0.0–0.1)
Basophils Relative: 0 %
Eosinophils Absolute: 0.1 10*3/uL (ref 0.0–0.5)
Eosinophils Relative: 2 %
HCT: 35 % — ABNORMAL LOW (ref 36.0–46.0)
Hemoglobin: 11.5 g/dL — ABNORMAL LOW (ref 12.0–15.0)
Lymphocytes Relative: 8 %
Lymphs Abs: 0.3 10*3/uL — ABNORMAL LOW (ref 0.7–4.0)
MCH: 30.8 pg (ref 26.0–34.0)
MCHC: 32.9 g/dL (ref 30.0–36.0)
MCV: 93.8 fL (ref 80.0–100.0)
Metamyelocytes Relative: 1 %
Monocytes Absolute: 0.1 10*3/uL (ref 0.1–1.0)
Monocytes Relative: 3 %
Myelocytes: 2 %
Neutro Abs: 2.7 10*3/uL (ref 1.7–7.7)
Neutrophils Relative %: 82 %
Platelet Morphology: NORMAL
Platelets: 174 10*3/uL (ref 150–400)
RBC: 3.73 MIL/uL — ABNORMAL LOW (ref 3.87–5.11)
RDW: 16.1 % — ABNORMAL HIGH (ref 11.5–15.5)
WBC: 3.2 10*3/uL — ABNORMAL LOW (ref 4.0–10.5)
nRBC: 0 % (ref 0.0–0.2)

## 2021-09-18 LAB — COMPREHENSIVE METABOLIC PANEL
ALT: 22 U/L (ref 0–44)
AST: 21 U/L (ref 15–41)
Albumin: 3 g/dL — ABNORMAL LOW (ref 3.5–5.0)
Alkaline Phosphatase: 39 U/L (ref 38–126)
Anion gap: 6 (ref 5–15)
BUN: 37 mg/dL — ABNORMAL HIGH (ref 8–23)
CO2: 25 mmol/L (ref 22–32)
Calcium: 8.6 mg/dL — ABNORMAL LOW (ref 8.9–10.3)
Chloride: 108 mmol/L (ref 98–111)
Creatinine, Ser: 0.7 mg/dL (ref 0.44–1.00)
GFR, Estimated: >60 mL/min (ref >60)
Glucose, Bld: 157 mg/dL — ABNORMAL HIGH (ref 70–99)
Potassium: 3.7 mmol/L (ref 3.5–5.1)
Sodium: 139 mmol/L (ref 135–145)
Total Bilirubin: 1.8 mg/dL — ABNORMAL HIGH (ref 0.3–1.2)
Total Protein: 6 g/dL — ABNORMAL LOW (ref 6.5–8.1)

## 2021-09-18 MED ORDER — SENNOSIDES-DOCUSATE SODIUM 8.6-50 MG PO TABS
2.0000 | ORAL_TABLET | Freq: Two times a day (BID) | ORAL | Status: DC
  Administered 2021-09-18 – 2021-09-21 (×7): 2 via ORAL
  Filled 2021-09-18 (×8): qty 2

## 2021-09-18 MED ORDER — CYCLOBENZAPRINE HCL 10 MG PO TABS
10.0000 mg | ORAL_TABLET | Freq: Once | ORAL | Status: AC
  Administered 2021-09-18: 10 mg via ORAL
  Filled 2021-09-18: qty 1

## 2021-09-18 MED ORDER — POLYETHYLENE GLYCOL 3350 17 G PO PACK
17.0000 g | PACK | Freq: Two times a day (BID) | ORAL | Status: DC
  Filled 2021-09-18: qty 1

## 2021-09-18 MED ORDER — PEG 3350-KCL-NA BICARB-NACL 420 G PO SOLR
4000.0000 mL | Freq: Once | ORAL | Status: AC
  Administered 2021-09-18: 4000 mL via ORAL

## 2021-09-18 MED ORDER — METOPROLOL TARTRATE 5 MG/5ML IV SOLN
2.5000 mg | INTRAVENOUS | Status: DC | PRN
  Administered 2021-09-18 – 2021-09-21 (×3): 2.5 mg via INTRAVENOUS
  Filled 2021-09-18 (×3): qty 5

## 2021-09-18 MED ORDER — CLONAZEPAM 0.125 MG PO TBDP
0.2500 mg | ORAL_TABLET | Freq: Every day | ORAL | Status: DC
  Administered 2021-09-18 – 2021-09-20 (×3): 0.25 mg via ORAL
  Filled 2021-09-18 (×3): qty 2

## 2021-09-18 NOTE — Progress Notes (Signed)
[facility] ROUNDING NOTE   Subjective: No BM yet.    Objective: Vital signs in last 24 hours: Temp:  [97.5 F (36.4 C)-99.5 F (37.5 C)] 97.5 F (36.4 C) (07/01 0623) Pulse Rate:  [110-132] 132 (07/01 0623) Resp:  [17-22] 20 (07/01 0623) BP: (115-139)/(69-80) 115/74 (07/01 0623) SpO2:  [94 %-99 %] 95 % (07/01 0825) Last BM Date : 09/14/21 General: NAD Abdomen: soft, non tender, mild distension    Intake/Output from previous day: 06/30 0701 - 07/01 0700 In: 122.7 [IV Piggyback:122.7] Out: -  Intake/Output this shift: No intake/output data recorded.   Lab Results: Recent Labs    09/16/21 0511 09/17/21 0450 09/18/21 0532  WBC 10.6* 15.3* 3.2*  HGB 12.1 12.0 11.5*  PLT 243 180 174  MCV 94.7 94.6 93.8   BMET Recent Labs    09/16/21 0511 09/17/21 0450 09/18/21 0532  NA 144 142 139  K 4.0 3.8 3.7  CL 113* 111 108  CO2 '24 24 25  '$ GLUCOSE 133* 121* 157*  BUN 23 19 37*  CREATININE 0.70 0.60 0.70  CALCIUM 8.5* 8.7* 8.6*   LFT Recent Labs    09/16/21 0511 09/17/21 0450 09/18/21 0532  PROT 6.0* 6.1* 6.0*  ALBUMIN 3.3* 3.4* 3.0*  AST '27 27 21  '$ ALT '23 21 22  '$ ALKPHOS 42 50 39  BILITOT 1.0 1.5* 1.8*   PT/INR No results for input(s): "INR" in the last 72 hours.    Imaging/Other results: DG Abd 1 View  Result Date: 09/17/2021 CLINICAL DATA:  Abdominal distension EXAM: ABDOMEN - 1 VIEW COMPARISON:  None Available. FINDINGS: No dilated loops of small bowel. Stool burden is mild. No radiopaque calculi. Cholecystectomy clips. IMPRESSION: Unremarkable bowel gas pattern. Electronically Signed   By: Macy Mis M.D.   On: 09/17/2021 08:18      Assessment &Plan  66 year old female with stage II right breast cancer status postlumpectomy on adjuvant chemotherapy admitted with worsening constipation and abdominal pain No evidence of bowel obstruction Mild pancytopenia secondary to chemotherapy  On Zosyn, has nonspecific mild sigmoid colitis.   Likely stercoral secondary to increased stool burden  Continue clear liquid diet Bowel purge with GoLytely as tolerated Continue supportive care  Please call GI with any questions  Dr. Mann/Dr Benson Norway will round on patient on Monday, July 3    K. Denzil Magnuson , MD [phone]  [facility]

## 2021-09-18 NOTE — Progress Notes (Signed)
Pt states she would like to try a muscle relaxer to help with her discomfort that has now moved into her back. Also, she states she cannot wear the telemetry due to the stickers causing "burn" marks on her skin. Flexeril has been requested for patient.

## 2021-09-18 NOTE — Progress Notes (Signed)
Christie Thomas is still having problems with her abdomen.  She is still not going to the bathroom for what she says.  She still having abdominal pain.  She is little bit short of breath.  She says that she has albuterol inhalers at home.  She may have a little bit of anxiety which I totally understand because of the abdominal issues.  Her white cell count is now starting to go down.  Her white cell count is 3.2.  Hemoglobin 11.5.  Platelet count 174,000.  At this point, we cannot however have any enemas.  I think her white cell count is going to keep dropping.  And it was increased risk of bacteremia.  I just hate the fact that she is having these issues.  Hopefully, Gastroenterology will be able to help out.  She did have a abdominal x-ray yesterday.  This did not show any obstruction.  She is afebrile.  Blood pressure 115/74.  Her lungs sound clear bilaterally.  I hear no wheezing.  She has good air movement.  Cardiac exam is slightly tachycardic but regular.  Abdomen is soft.  Bowel sounds are decreased.  There is some tenderness noted in the lower abdomen.  Neurological exam is nonfocal.  Again, she has this colitis.  I have to believe this is somehow related to the Taxotere that we gave her.  This is a very rare complication of Taxotere.  Hopefully, she will start going to the bathroom to make herself feel better.  I do appreciate the great care that she is getting from all the staff on 5 E.  Lattie Haw, MD  Oswaldo Milian 41:10

## 2021-09-18 NOTE — Progress Notes (Signed)
   09/18/21 1033  Assess: MEWS Score  Temp 98.6 F (37 C)  BP 117/70  MAP (mmHg) 85  Pulse Rate (!) 145  Resp 18  SpO2 96 %  O2 Device Room Air  Assess: MEWS Score  MEWS Temp 0  MEWS Systolic 0  MEWS Pulse 3  MEWS RR 0  MEWS LOC 0  MEWS Score 3  MEWS Score Color Yellow  Assess: if the MEWS score is Yellow or Red  Were vital signs taken at a resting state? Yes  Focused Assessment No change from prior assessment  Does the patient meet 2 or more of the SIRS criteria? No  Does the patient have a confirmed or suspected source of infection? No  Provider and Rapid Response Notified? No  MEWS guidelines implemented *See Row Information* No, previously yellow, continue vital signs every 4 hours  Assess: SIRS CRITERIA  SIRS Temperature  0  SIRS Pulse 1  SIRS Respirations  0  SIRS WBC 0  SIRS Score Sum  1   Pt in yellow MEWS due to elevated HR 145. Pt was in yellow MEWS throughout the night for elevated HR. MD made aware. New orders placed for EKG, telemetry, and prn metoprolol. Unable to place pt on telemetry due to pt having an allergy to the telemetry stickers. Will continue q4h VS at this time.

## 2021-09-18 NOTE — Progress Notes (Signed)
PROGRESS NOTE    Christie Thomas  BZJ:[id number] DOB: Aug 08, 1955 DOA: 09/03/2021 PCP: Volanda Napoleon, MD   Brief Narrative:  66 year old female with history of breast cancer treated with chemotherapy, rheumatoid arthritis, gastroparesis, hyperlipidemia, fatty liver, recent admission from 08/01/2021-08/07/2021 for sepsis secondary to neutropenic fever/acute diverticulitis/UTI treated with IV meropenem and subsequent oral Augmentin presented with worsening abdomen, constipation and cramping.  On presentation, CT of the abdomen and pelvis showed formed stool in the sigmoid and descending colon, moderate distention and pericolonic stranding with evidence of diffuse colitis, however mostly with hard stool.  She was started on IV Zosyn.  GI was consulted.  Oncology was consulted as well.  Assessment & Plan:   Diffuse colitis, concern for chemotherapy-induced mucosal inflammation Concern for stercoral colitis Diffuse abdominal pain Significant constipation -Currently on Zosyn empirically for colitis.  Unclear if this is colitis from chemotherapy induced mucosal inflammation, bacterial infection versus stercoral colitis -Still has not had bowel movement yet.  GI following.  Follow recommendations.  Patient states that she cannot tolerate MiraLAX.  Continue Senokot -Continue pain management  Breast cancer currently undergoing chemotherapy -Oncology following.  Recently received Neupogen  Leukocytosis -Possibly from Neupogen use.  Resolved  Leukopenia -Patient is leukopenic today  Hyperlipidemia -hold statin and fenofibrate until GI symptoms improve  Obesity -Outpatient follow-up  Anxiety -Continue Klonopin at night     DVT prophylaxis: Lovenox Code Status: Full Family Communication: Husband at bedside Disposition Plan: Status is: Inpatient Remains inpatient appropriate because: Of severity of illness    Consultants: GI/oncology  Procedures: None  Antimicrobials:  Zosyn   Subjective: Patient seen and examined at bedside.  Still complains of constipation and some nausea.  No overnight fever, chest pain, shortness of breath reported.  Objective: Vitals:   09/18/21 0623 09/18/21 0825 09/18/21 1033 09/18/21 1400  BP: 115/74  117/70 111/71  Pulse: (!) 132  (!) 145 (!) 116  Resp: '20  18 18  '$ Temp: (!) 97.5 F (36.4 C)  98.6 F (37 C) 98.2 F (36.8 C)  TempSrc: Oral  Oral Oral  SpO2: 97% 95% 96% 96%  Weight:      Height:        Intake/Output Summary (Last 24 hours) at 09/18/2021 1408 Last data filed at 09/18/2021 1240 Gross per 24 hour  Intake 360 ml  Output --  Net 360 ml   Filed Weights   09/16/2021 0728  Weight: 81.6 kg    Examination:  General exam: Appears calm and comfortable.  Looks chronically ill and deconditioned.  Currently on room air. Respiratory system: Bilateral decreased breath sounds at bases with some scattered crackles Cardiovascular system: S1 & S2 heard, intermittently tachycardic gastrointestinal system: Abdomen is distended, soft and tender in the lower quadrant.  Normal bowel sounds heard. Extremities: No cyanosis, clubbing; trace lower extremity edema  Central nervous system: Alert and oriented. No focal neurological deficits. Moving extremities Skin: No rashes, lesions or ulcers Psychiatry: Judgement and insight appear normal. Mood & affect appropriate.     Data Reviewed: I have personally reviewed following labs and imaging studies  CBC: Recent Labs  Lab 09/14/21 0830 09/14/2021 0850 09/16/21 0511 09/17/21 0450 09/18/21 0532  WBC 11.1* 17.8* 10.6* 15.3* 3.2*  NEUTROABS 9.6* 15.7*  --  14.0* 2.7  HGB 12.3 13.6 12.1 12.0 11.5*  HCT 37.5 41.6 37.2 37.0 35.0*  MCV 93.5 92.9 94.7 94.6 93.8  PLT 244 315 243 180 017   Basic Metabolic Panel: Recent Labs  Lab 09/14/21 0830  08/26/2021 0850 09/16/21 0511 09/17/21 0450 09/18/21 0532  NA 142 145 144 142 139  K 4.3 4.0 4.0 3.8 3.7  CL 110 113* 113* 111 108   CO2 '22 22 24 24 25  '$ GLUCOSE 182* 136* 133* 121* 157*  BUN '17 23 23 19 '$ 37*  CREATININE 0.92 1.01* 0.70 0.60 0.70  CALCIUM 10.2 9.6 8.5* 8.7* 8.6*   GFR: Estimated Creatinine Clearance: 71.5 mL/min (by C-G formula based on SCr of 0.7 mg/dL). Liver Function Tests: Recent Labs  Lab 09/14/21 0830 08/21/2021 0850 09/16/21 0511 09/17/21 0450 09/18/21 0532  AST 31 34 '27 27 21  '$ ALT '21 25 23 21 22  '$ ALKPHOS 65 56 42 50 39  BILITOT 0.7 0.9 1.0 1.5* 1.8*  PROT 7.1 7.2 6.0* 6.1* 6.0*  ALBUMIN 4.3 4.1 3.3* 3.4* 3.0*   Recent Labs  Lab 09/10/2021 0850  LIPASE 45   No results for input(s): "AMMONIA" in the last 168 hours. Coagulation Profile: No results for input(s): "INR", "PROTIME" in the last 168 hours. Cardiac Enzymes: No results for input(s): "CKTOTAL", "CKMB", "CKMBINDEX", "TROPONINI" in the last 168 hours. BNP (last 3 results) No results for input(s): "PROBNP" in the last 8760 hours. HbA1C: No results for input(s): "HGBA1C" in the last 72 hours. CBG: No results for input(s): "GLUCAP" in the last 168 hours. Lipid Profile: No results for input(s): "CHOL", "HDL", "LDLCALC", "TRIG", "CHOLHDL", "LDLDIRECT" in the last 72 hours. Thyroid Function Tests: No results for input(s): "TSH", "T4TOTAL", "FREET4", "T3FREE", "THYROIDAB" in the last 72 hours. Anemia Panel: No results for input(s): "VITAMINB12", "FOLATE", "FERRITIN", "TIBC", "IRON", "RETICCTPCT" in the last 72 hours. Sepsis Labs: No results for input(s): "PROCALCITON", "LATICACIDVEN" in the last 168 hours.  No results found for this or any previous visit (from the past 240 hour(s)).       Radiology Studies: DG Abd 1 View  Result Date: 09/17/2021 CLINICAL DATA:  Abdominal distension EXAM: ABDOMEN - 1 VIEW COMPARISON:  None Available. FINDINGS: No dilated loops of small bowel. Stool burden is mild. No radiopaque calculi. Cholecystectomy clips. IMPRESSION: Unremarkable bowel gas pattern. Electronically Signed   By: Macy Mis M.D.   On: 09/17/2021 08:18        Scheduled Meds:  budesonide (PULMICORT) nebulizer solution  0.25 mg Nebulization BID   Chlorhexidine Gluconate Cloth  6 each Topical Daily   clonazepam  0.25 mg Oral QHS   dexamethasone (DECADRON) injection  4 mg Intravenous Q12H   diphenhydrAMINE  25 mg Intravenous Once   enoxaparin (LOVENOX) injection  40 mg Subcutaneous QHS   famotidine  40 mg Oral BID   feeding supplement  237 mL Oral BID BM   fluconazole  100 mg Oral Daily   multivitamin with minerals  1 tablet Oral Daily   polyethylene glycol  17 g Oral Once   polyethylene glycol  17 g Oral BID   protein supplement  1 Scoop Oral TID WC   senna-docusate  2 tablet Oral BID   sodium phosphate  1 enema Rectal Once   Tbo-Filgrastim  480 mcg Subcutaneous q1800   Continuous Infusions:  sodium chloride Stopped (09/17/21 1847)   piperacillin-tazobactam (ZOSYN)  IV 3.375 g (09/18/21 0527)          Aline August, MD [facility] 09/18/2021, 2:08 PM

## 2021-09-18 DEATH — deceased

## 2021-09-19 DIAGNOSIS — K5289 Other specified noninfective gastroenteritis and colitis: Secondary | ICD-10-CM | POA: Diagnosis not present

## 2021-09-19 DIAGNOSIS — R1084 Generalized abdominal pain: Secondary | ICD-10-CM | POA: Diagnosis not present

## 2021-09-19 DIAGNOSIS — C50211 Malignant neoplasm of upper-inner quadrant of right female breast: Secondary | ICD-10-CM | POA: Diagnosis not present

## 2021-09-19 DIAGNOSIS — D72829 Elevated white blood cell count, unspecified: Secondary | ICD-10-CM

## 2021-09-19 DIAGNOSIS — K5900 Constipation, unspecified: Secondary | ICD-10-CM | POA: Diagnosis not present

## 2021-09-19 DIAGNOSIS — R109 Unspecified abdominal pain: Secondary | ICD-10-CM | POA: Diagnosis not present

## 2021-09-19 DIAGNOSIS — Z9981 Dependence on supplemental oxygen: Secondary | ICD-10-CM

## 2021-09-19 DIAGNOSIS — K5901 Slow transit constipation: Secondary | ICD-10-CM | POA: Diagnosis not present

## 2021-09-19 DIAGNOSIS — D638 Anemia in other chronic diseases classified elsewhere: Secondary | ICD-10-CM

## 2021-09-19 DIAGNOSIS — E876 Hypokalemia: Secondary | ICD-10-CM

## 2021-09-19 DIAGNOSIS — D701 Agranulocytosis secondary to cancer chemotherapy: Secondary | ICD-10-CM | POA: Diagnosis not present

## 2021-09-19 DIAGNOSIS — K6389 Other specified diseases of intestine: Secondary | ICD-10-CM | POA: Diagnosis not present

## 2021-09-19 LAB — COMPREHENSIVE METABOLIC PANEL
ALT: 19 U/L (ref 0–44)
AST: 19 U/L (ref 15–41)
Albumin: 2.9 g/dL — ABNORMAL LOW (ref 3.5–5.0)
Alkaline Phosphatase: 30 U/L — ABNORMAL LOW (ref 38–126)
Anion gap: 9 (ref 5–15)
BUN: 55 mg/dL — ABNORMAL HIGH (ref 8–23)
CO2: 23 mmol/L (ref 22–32)
Calcium: 8.6 mg/dL — ABNORMAL LOW (ref 8.9–10.3)
Chloride: 108 mmol/L (ref 98–111)
Creatinine, Ser: 1.12 mg/dL — ABNORMAL HIGH (ref 0.44–1.00)
GFR, Estimated: 54 mL/min — ABNORMAL LOW (ref >60)
Glucose, Bld: 139 mg/dL — ABNORMAL HIGH (ref 70–99)
Potassium: 3.3 mmol/L — ABNORMAL LOW (ref 3.5–5.1)
Sodium: 140 mmol/L (ref 135–145)
Total Bilirubin: 1.5 mg/dL — ABNORMAL HIGH (ref 0.3–1.2)
Total Protein: 5.9 g/dL — ABNORMAL LOW (ref 6.5–8.1)

## 2021-09-19 LAB — CBC WITH DIFFERENTIAL/PLATELET
Abs Immature Granulocytes: 0.14 10*3/uL — ABNORMAL HIGH (ref 0.00–0.07)
Basophils Absolute: 0 10*3/uL (ref 0.0–0.1)
Basophils Relative: 5 %
Eosinophils Absolute: 0 10*3/uL (ref 0.0–0.5)
Eosinophils Relative: 0 %
HCT: 32.8 % — ABNORMAL LOW (ref 36.0–46.0)
Hemoglobin: 10.7 g/dL — ABNORMAL LOW (ref 12.0–15.0)
Immature Granulocytes: 23 %
Lymphocytes Relative: 32 %
Lymphs Abs: 0.2 10*3/uL — ABNORMAL LOW (ref 0.7–4.0)
MCH: 30.7 pg (ref 26.0–34.0)
MCHC: 32.6 g/dL (ref 30.0–36.0)
MCV: 94.3 fL (ref 80.0–100.0)
Monocytes Absolute: 0.1 10*3/uL (ref 0.1–1.0)
Monocytes Relative: 8 %
Neutro Abs: 0.2 10*3/uL — CL (ref 1.7–7.7)
Neutrophils Relative %: 32 %
Platelets: 174 10*3/uL (ref 150–400)
RBC: 3.48 MIL/uL — ABNORMAL LOW (ref 3.87–5.11)
RDW: 15.9 % — ABNORMAL HIGH (ref 11.5–15.5)
WBC: 0.6 10*3/uL — CL (ref 4.0–10.5)
nRBC: 0 % (ref 0.0–0.2)

## 2021-09-19 MED ORDER — METHOCARBAMOL 500 MG PO TABS
500.0000 mg | ORAL_TABLET | Freq: Four times a day (QID) | ORAL | Status: DC | PRN
Start: 2021-09-19 — End: 2021-09-22
  Administered 2021-09-20 – 2021-09-21 (×2): 500 mg via ORAL
  Filled 2021-09-19 (×3): qty 1

## 2021-09-19 MED ORDER — POTASSIUM CHLORIDE 20 MEQ PO PACK
40.0000 meq | PACK | Freq: Once | ORAL | Status: DC
Start: 2021-09-19 — End: 2021-09-22
  Filled 2021-09-19: qty 2

## 2021-09-19 NOTE — Plan of Care (Signed)

## 2021-09-19 NOTE — Progress Notes (Signed)
As expected, she is now profoundly neutropenic.  Her white cell count is 0.6.  The absolute neutrophil count is 200.  Her monocytes are 8.  I would like to hope that her white cell count will only stay low for a couple more days.  She still has not had a bowel movement.  She is now on liquid colonoscopy prep.  Overall, she will go to the bathroom today.  She feels like she will be able to go to the bathroom.  Again, there is NO Enema for her while she is neutropenic.  Her BUN is 55 creatinine 1.12.  This will to be watched.  Again all cultures are negative.  She is on prophylactic antibiotics which is reasonable.  Her potassium is 3.3.  I know that Gastroenterology is following her closely.  Her temperature 98.1.  Pulse 126.  Blood pressure 116/76.  Her lungs sound clear bilaterally.  I know she is on some supplemental oxygen.  Oxygen saturation is 99%.  Cardiac exam is tachycardic but regular.  She has no murmurs, rubs or bruits.  Abdomen is slightly distended.  She has no obvious fluid wave.  Bowel sounds might be slightly decreased.  Extremity shows no clubbing, cyanosis or edema.  Ms. Folden is neutropenic now because of the Taxotere/Cytoxan protocol that she received.  She has this colitis.  She is not going to the bathroom.  Hopefully she will go today.  I will continue her on the antibiotics.  We will have to monitor her white cell count daily.  I do appreciate the incredible care that she is getting from all the staff on 5 E.  Lattie Haw, MD  Colossians 3:23

## 2021-09-19 NOTE — Progress Notes (Signed)
   Patient Name: Christie Thomas Date of Encounter: 09/19/2021, 10:16 AM    Subjective  Worsening pancytopenia and neutropenia. Continues to have generalized abdominal discomfort.  She has not had any bowel movement. She is trying to sip on fluids.  She was able to drink about a cup and half of GoLytely in the past 24 hours   Objective  BP 116/76 (BP Location: Left Arm)   Pulse (!) 126   Temp 98.1 F (36.7 C) (Oral)   Resp 18   Ht '5\' 4"'$  (1.626 m)   Wt 81.6 kg   SpO2 98%   BMI 30.88 kg/m      Assessment and Plan   66 year old female with history of breast cancer on active chemotherapy with pancytopenia neutropenia  Increased colonic stool burden with fecal impaction in the rectosigmoid with stercoral colitis On antibiotics Continue supportive care Avoid enema, rectal exam or suppositories given severe neutropenia  Continue bowel prep as tolerated Continue liquid diet as tolerated  Replete electrolytes  Dr. Mann/Dr. Benson Norway will round on patient tomorrow   K. Denzil Magnuson , MD [phone]

## 2021-09-19 NOTE — Progress Notes (Signed)
PROGRESS NOTE    Christie Thomas  AOZ:[id number] DOB: 09-May-1955 DOA: 09/07/2021 PCP: Volanda Napoleon, MD   Brief Narrative:  66 year old female with history of breast cancer treated with chemotherapy, rheumatoid arthritis, gastroparesis, hyperlipidemia, fatty liver, recent admission from 08/01/2021-08/07/2021 for sepsis secondary to neutropenic fever/acute diverticulitis/UTI treated with IV meropenem and subsequent oral Augmentin presented with worsening abdomen, constipation and cramping.  On presentation, CT of the abdomen and pelvis showed formed stool in the sigmoid and descending colon, moderate distention and pericolonic stranding with evidence of diffuse colitis, however mostly with hard stool.  She was started on IV Zosyn.  GI was consulted.  Oncology was consulted as well.  Assessment & Plan:   Diffuse colitis, concern for chemotherapy-induced mucosal inflammation Concern for stercoral colitis Diffuse abdominal pain Significant constipation -Currently on Zosyn empirically for colitis.  Unclear if this is colitis from chemotherapy induced mucosal inflammation, bacterial infection versus stercoral colitis -Still has not had bowel movement yet.  GI following.  Patient is getting bowel prep as per GI.  Patient states that she cannot tolerate MiraLAX.  Continue Senokot -Continue pain management  Breast cancer currently undergoing chemotherapy -Oncology following.  Recently received Neupogen  Sinus tachycardia -Has had episodes of intermittent sinus tachycardia since 09/18/2021 use IV metoprolol if needed.  Leukocytosis -Possibly from Neupogen use.  Resolved  Leukopenia -Leukopenia has worsened.  WBC 0.65  Anemia of chronic disease -From cancer and chemotherapy.  Hypokalemia -Replace.  Repeat a.m. labs  Hyperlipidemia -hold statin and fenofibrate until GI symptoms improve  Obesity -Outpatient follow-up  Anxiety -Continue Klonopin at night     DVT prophylaxis:  Lovenox Code Status: Full Family Communication: Husband at bedside on 09/18/2021 Disposition Plan: Status is: Inpatient Remains inpatient appropriate because: Of severity of illness    Consultants: GI/oncology  Procedures: None  Antimicrobials: Zosyn   Subjective: Patient seen and examined at bedside.  Still complains of intermittent nausea and has not had a bowel movement today but feels like she is going to have 1 today.  Could not finish her bowel prep.  Denies worsening abdominal pain or vomiting.  Denies chest pain or worsening shortness of breath. Objective: Vitals:   09/19/21 0232 09/19/21 0259 09/19/21 0504 09/19/21 0825  BP: 136/73  116/76   Pulse: (!) 108  (!) 126   Resp: 16  18   Temp: 98.1 F (36.7 C)  98.1 F (36.7 C)   TempSrc: Oral  Oral   SpO2: (!) 86% 97% 99% 98%  Weight:      Height:        Intake/Output Summary (Last 24 hours) at 09/19/2021 0832 Last data filed at 09/19/2021 0800 Gross per 24 hour  Intake 840 ml  Output --  Net 840 ml    Filed Weights   08/21/2021 0728  Weight: 81.6 kg    Examination:  General: On room air.  No distress ENT/neck: No thyromegaly.  JVD is not elevated  respiratory: Decreased breath sounds at bases bilaterally with some crackles; no wheezing CVS: S1-S2 heard, tachycardic intermittently Abdominal: Soft, mildly tender in the lower quadrant; slightly distended; no organomegaly, normal bowel sounds are heard Extremities: Trace lower extremity edema; no cyanosis  CNS: Awake and alert.  No focal neurologic deficit.  Moves extremities Lymph: No obvious lymphadenopathy Skin: No obvious ecchymosis/lesions  psych: Affect, judgment and mood are normal  musculoskeletal: No obvious joint swelling/deformity     Data Reviewed: I have personally reviewed following labs and imaging studies  CBC:  Recent Labs  Lab 09/14/21 0830 09/07/2021 0850 09/16/21 0511 09/17/21 0450 09/18/21 0532 09/19/21 0310  WBC 11.1* 17.8* 10.6*  15.3* 3.2* 0.6*  NEUTROABS 9.6* 15.7*  --  14.0* 2.7 0.2*  HGB 12.3 13.6 12.1 12.0 11.5* 10.7*  HCT 37.5 41.6 37.2 37.0 35.0* 32.8*  MCV 93.5 92.9 94.7 94.6 93.8 94.3  PLT 244 315 243 180 174 283    Basic Metabolic Panel: Recent Labs  Lab 09/08/2021 0850 09/16/21 0511 09/17/21 0450 09/18/21 0532 09/19/21 0310  NA 145 144 142 139 140  K 4.0 4.0 3.8 3.7 3.3*  CL 113* 113* 111 108 108  CO2 '22 24 24 25 23  '$ GLUCOSE 136* 133* 121* 157* 139*  BUN '23 23 19 '$ 37* 55*  CREATININE 1.01* 0.70 0.60 0.70 1.12*  CALCIUM 9.6 8.5* 8.7* 8.6* 8.6*    GFR: Estimated Creatinine Clearance: 51.1 mL/min (A) (by C-G formula based on SCr of 1.12 mg/dL (H)). Liver Function Tests: Recent Labs  Lab 08/29/2021 0850 09/16/21 0511 09/17/21 0450 09/18/21 0532 09/19/21 0310  AST 34 '27 27 21 19  '$ ALT '25 23 21 22 19  '$ ALKPHOS 56 42 50 39 30*  BILITOT 0.9 1.0 1.5* 1.8* 1.5*  PROT 7.2 6.0* 6.1* 6.0* 5.9*  ALBUMIN 4.1 3.3* 3.4* 3.0* 2.9*    Recent Labs  Lab 09/14/2021 0850  LIPASE 45    No results for input(s): "AMMONIA" in the last 168 hours. Coagulation Profile: No results for input(s): "INR", "PROTIME" in the last 168 hours. Cardiac Enzymes: No results for input(s): "CKTOTAL", "CKMB", "CKMBINDEX", "TROPONINI" in the last 168 hours. BNP (last 3 results) No results for input(s): "PROBNP" in the last 8760 hours. HbA1C: No results for input(s): "HGBA1C" in the last 72 hours. CBG: No results for input(s): "GLUCAP" in the last 168 hours. Lipid Profile: No results for input(s): "CHOL", "HDL", "LDLCALC", "TRIG", "CHOLHDL", "LDLDIRECT" in the last 72 hours. Thyroid Function Tests: No results for input(s): "TSH", "T4TOTAL", "FREET4", "T3FREE", "THYROIDAB" in the last 72 hours. Anemia Panel: No results for input(s): "VITAMINB12", "FOLATE", "FERRITIN", "TIBC", "IRON", "RETICCTPCT" in the last 72 hours. Sepsis Labs: No results for input(s): "PROCALCITON", "LATICACIDVEN" in the last 168 hours.  No results  found for this or any previous visit (from the past 240 hour(s)).       Radiology Studies: No results found.      Scheduled Meds:  budesonide (PULMICORT) nebulizer solution  0.25 mg Nebulization BID   Chlorhexidine Gluconate Cloth  6 each Topical Daily   clonazepam  0.25 mg Oral QHS   dexamethasone (DECADRON) injection  4 mg Intravenous Q12H   diphenhydrAMINE  25 mg Intravenous Once   enoxaparin (LOVENOX) injection  40 mg Subcutaneous QHS   famotidine  40 mg Oral BID   feeding supplement  237 mL Oral BID BM   fluconazole  100 mg Oral Daily   multivitamin with minerals  1 tablet Oral Daily   polyethylene glycol  17 g Oral Once   polyethylene glycol  17 g Oral BID   protein supplement  1 Scoop Oral TID WC   senna-docusate  2 tablet Oral BID   sodium phosphate  1 enema Rectal Once   Tbo-Filgrastim  480 mcg Subcutaneous q1800   Continuous Infusions:  sodium chloride 100 mL/hr at 09/19/21 0300   piperacillin-tazobactam (ZOSYN)  IV 3.375 g (09/19/21 0541)          Aline August, MD [facility] 09/19/2021, 8:32 AM

## 2021-09-20 DIAGNOSIS — D701 Agranulocytosis secondary to cancer chemotherapy: Secondary | ICD-10-CM | POA: Diagnosis not present

## 2021-09-20 DIAGNOSIS — C50211 Malignant neoplasm of upper-inner quadrant of right female breast: Secondary | ICD-10-CM | POA: Diagnosis not present

## 2021-09-20 DIAGNOSIS — R0602 Shortness of breath: Secondary | ICD-10-CM | POA: Diagnosis not present

## 2021-09-20 DIAGNOSIS — K5289 Other specified noninfective gastroenteritis and colitis: Secondary | ICD-10-CM | POA: Diagnosis not present

## 2021-09-20 DIAGNOSIS — R1084 Generalized abdominal pain: Secondary | ICD-10-CM | POA: Diagnosis not present

## 2021-09-20 DIAGNOSIS — K5900 Constipation, unspecified: Secondary | ICD-10-CM | POA: Diagnosis not present

## 2021-09-20 DIAGNOSIS — D638 Anemia in other chronic diseases classified elsewhere: Secondary | ICD-10-CM | POA: Diagnosis not present

## 2021-09-20 LAB — COMPREHENSIVE METABOLIC PANEL
ALT: 17 U/L (ref 0–44)
AST: 16 U/L (ref 15–41)
Albumin: 2.7 g/dL — ABNORMAL LOW (ref 3.5–5.0)
Alkaline Phosphatase: 30 U/L — ABNORMAL LOW (ref 38–126)
Anion gap: 9 (ref 5–15)
BUN: 41 mg/dL — ABNORMAL HIGH (ref 8–23)
CO2: 24 mmol/L (ref 22–32)
Calcium: 8.7 mg/dL — ABNORMAL LOW (ref 8.9–10.3)
Chloride: 110 mmol/L (ref 98–111)
Creatinine, Ser: 0.57 mg/dL (ref 0.44–1.00)
GFR, Estimated: >60 mL/min (ref >60)
Glucose, Bld: 114 mg/dL — ABNORMAL HIGH (ref 70–99)
Potassium: 3.6 mmol/L (ref 3.5–5.1)
Sodium: 143 mmol/L (ref 135–145)
Total Bilirubin: 1.1 mg/dL (ref 0.3–1.2)
Total Protein: 5.9 g/dL — ABNORMAL LOW (ref 6.5–8.1)

## 2021-09-20 LAB — MAGNESIUM: Magnesium: 2.6 mg/dL — ABNORMAL HIGH (ref 1.7–2.4)

## 2021-09-20 LAB — CBC WITH DIFFERENTIAL/PLATELET
Abs Immature Granulocytes: 0 10*3/uL (ref 0.00–0.07)
Basophils Absolute: 0 10*3/uL (ref 0.0–0.1)
Basophils Relative: 2 %
Eosinophils Absolute: 0 10*3/uL (ref 0.0–0.5)
Eosinophils Relative: 0 %
HCT: 30.3 % — ABNORMAL LOW (ref 36.0–46.0)
Hemoglobin: 9.7 g/dL — ABNORMAL LOW (ref 12.0–15.0)
Immature Granulocytes: 0 %
Lymphocytes Relative: 24 %
Lymphs Abs: 0.2 10*3/uL — ABNORMAL LOW (ref 0.7–4.0)
MCH: 30.4 pg (ref 26.0–34.0)
MCHC: 32 g/dL (ref 30.0–36.0)
MCV: 95 fL (ref 80.0–100.0)
Monocytes Absolute: 0.3 10*3/uL (ref 0.1–1.0)
Monocytes Relative: 46 %
Neutro Abs: 0.2 10*3/uL — CL (ref 1.7–7.7)
Neutrophils Relative %: 28 %
Platelets: 159 10*3/uL (ref 150–400)
RBC: 3.19 MIL/uL — ABNORMAL LOW (ref 3.87–5.11)
RDW: 15.8 % — ABNORMAL HIGH (ref 11.5–15.5)
WBC: 0.7 10*3/uL — CL (ref 4.0–10.5)
nRBC: 0 % (ref 0.0–0.2)

## 2021-09-20 MED ORDER — ONDANSETRON HCL 4 MG/2ML IJ SOLN
4.0000 mg | Freq: Once | INTRAMUSCULAR | Status: AC
  Administered 2021-09-20: 4 mg via INTRAVENOUS
  Filled 2021-09-20: qty 2

## 2021-09-20 MED ORDER — MAGIC MOUTHWASH
5.0000 mL | Freq: Three times a day (TID) | ORAL | Status: AC
  Administered 2021-09-20 – 2021-09-21 (×3): 5 mL via ORAL
  Filled 2021-09-20 (×3): qty 5

## 2021-09-20 NOTE — Progress Notes (Addendum)
Subjective: Patient has developed problems with neutropenia, anemia with a platelet count of 159 K. She has not had a bowel movements since admission has not consumed much of the GoLytely that was prescribed for her over the weekend. She has been sipping on it but has not had a BM. She had some lower abdominal discomfort and feels very bloated but has been passing flatus. She has been on clears since admission and has not been able to drink much. She feels he has recurrence of the oral thrush as he she r tongue is heavily coated at this time. She complains of severe rectal pain and pressure every time he she tries to have a BM but is not able to pass any stool. Her husband is in the room with her.  Objective: Vital signs in last 24 hours: Temp:  [97.9 F (36.6 C)-99.3 F (37.4 C)] 97.9 F (36.6 C) (07/03 0620) Pulse Rate:  [101-124] 101 (07/03 0620) Resp:  [17] 17 (07/02 1427) BP: (123-135)/(61-70) 135/68 (07/03 0620) SpO2:  [96 %-98 %] 96 % (07/03 0620) Last BM Date : 09/14/21  Intake/Output from previous day: 07/02 0701 - 07/03 0700 In: 1000 [P.O.:480; I.V.:450; IV Piggyback:70] Out: -  Intake/Output this shift: No intake/output data recorded.  General appearance: alert, cooperative, appears stated age, fatigued, mild distress, and moderately obese; the tongue is coated with whitish exudates consistent with candidiasis Resp: clear to auscultation bilaterally Cardio: regular rate and rhythm, S1, S2 normal, no murmur, click, rub or gallop GI: soft, non-tender; audible bowel sounds bowel sounds; no masses,  no organomegaly; there is tenderness on palpation of the lower abdomen in the suprapubic and the periumbilical area with mild guarding but without rebound or rigidity 73 Extremities: extremities normal, atraumatic, no cyanosis or edema  Lab Results: Recent Labs    09/18/21 0532 09/19/21 0310 09/20/21 0335  WBC 3.2* 0.6* 0.7*  HGB 11.5* 10.7* 9.7*  HCT 35.0* 32.8* 30.3*  PLT 174  174 159   BMET Recent Labs    09/18/21 0532 09/19/21 0310 09/20/21 0335  NA 139 140 143  K 3.7 3.3* 3.6  CL 108 108 110  CO2 '25 23 24  '$ GLUCOSE 157* 139* 114*  BUN 37* 55* 41*  CREATININE 0.70 1.12* 0.57  CALCIUM 8.6* 8.6* 8.7*   LFT Recent Labs    09/20/21 0335  PROT 5.9*  ALBUMIN 2.7*  AST 16  ALT 17  ALKPHOS 30*  BILITOT 1.1   Studies/Results: No results found.  Medications: I have reviewed the patient's current medications. Prior to Admission:  Medications Prior to Admission  Medication Sig Dispense Refill Last Dose   acetaminophen (TYLENOL) 500 MG tablet Take 500 mg by mouth every 6 (six) hours as needed for moderate pain.   unk   antiseptic oral rinse (BIOTENE) LIQD 15 mLs by Mouth Rinse route 3 (three) times daily as needed for dry mouth.   09/14/2021   clonazePAM (KLONOPIN) 0.5 MG tablet Take 0.5 tablets (0.25 mg total) by mouth at bedtime.   09/14/2021   dexamethasone (DECADRON) 4 MG tablet Take 2 tablets (8 mg total) by mouth 2 (two) times daily. Start the day before Taxotere. Then again the day after chemo for 3 days. 30 tablet 1 09/14/2021   famotidine (PEPCID) 40 MG tablet Take 1 tablet (40 mg total) by mouth 2 (two) times daily. 30 tablet 3 09/14/2021   fenofibrate 160 MG tablet TAKE 1 TABLET ONCE DAILY WITH FOOD. (Patient taking differently: Take 160 mg by  mouth daily.) 90 tablet 0 09/14/2021   fluconazole (DIFLUCAN) 200 MG tablet Take 1 tablet (200 mg total) by mouth daily. (Patient taking differently: Take 200 mg by mouth daily as needed (yeast).) 30 tablet 2 Past Week   fluticasone (FLONASE) 50 MCG/ACT nasal spray Place 1-2 sprays into both nostrils daily as needed for allergies.   Past Week   lactulose (CHRONULAC) 10 GM/15ML solution Take 30 mLs (20 g total) by mouth 2 (two) times daily as needed for mild constipation. 236 mL 3 09/14/2021   lidocaine-prilocaine (EMLA) cream Apply 1 application. topically daily as needed. Apply to affected area once   unk    naproxen sodium (ALEVE) 220 MG tablet Take 220 mg by mouth daily as needed (pain).   Past Week   ondansetron (ZOFRAN) 8 MG tablet Take 1 tablet (8 mg total) by mouth 2 (two) times daily as needed for refractory nausea / vomiting. Start on day 3 after chemo. 30 tablet 1 09/14/2021   OVER THE COUNTER MEDICATION daily. Ultra Flora IB   Past Week   OVER THE COUNTER MEDICATION Take 1 capsule by mouth daily. EMMA for Gut health   09/14/2021   prochlorperazine (COMPAZINE) 10 MG tablet Take 1 tablet (10 mg total) by mouth every 6 (six) hours as needed (Nausea or vomiting). (Patient taking differently: Take 10 mg by mouth every 6 (six) hours as needed for vomiting or nausea (Nausea or vomiting).) 30 tablet 1 unk   Propylene Glycol (SYSTANE COMPLETE OP) Place 1 drop into both eyes daily as needed (dry eyes).   Past Week   rosuvastatin (CRESTOR) 10 MG tablet Take 1 tablet (10 mg total) by mouth at bedtime.   09/14/2021   traMADol (ULTRAM) 50 MG tablet Take 50 mg by mouth every 6 (six) hours as needed for severe pain.   09/14/2021   magic mouthwash SOLN Take 10 mLs by mouth 4 (four) times daily as needed for mouth pain. (Patient not taking: Reported on 09/16/2021) 240 mL 0 Not Taking   OLANZapine (ZYPREXA) 10 MG tablet Take 1 tablet (10 mg total) by mouth at bedtime. (Patient not taking: Reported on 08/31/2021) 30 tablet 1 Not Taking   Scheduled:  budesonide (PULMICORT) nebulizer solution  0.25 mg Nebulization BID   Chlorhexidine Gluconate Cloth  6 each Topical Daily   clonazepam  0.25 mg Oral QHS   dexamethasone (DECADRON) injection  4 mg Intravenous Q12H   diphenhydrAMINE  25 mg Intravenous Once   enoxaparin (LOVENOX) injection  40 mg Subcutaneous QHS   famotidine  40 mg Oral BID   feeding supplement  237 mL Oral BID BM   fluconazole  100 mg Oral Daily   multivitamin with minerals  1 tablet Oral Daily   polyethylene glycol  17 g Oral Once   potassium chloride  40 mEq Oral Once   protein supplement  1 Scoop  Oral TID WC   senna-docusate  2 tablet Oral BID   sodium phosphate  1 enema Rectal Once   Tbo-Filgrastim  480 mcg Subcutaneous q1800   Continuous:  sodium chloride 75 mL/hr at 09/20/21 0737   piperacillin-tazobactam (ZOSYN)  IV 3.375 g (09/20/21 1328)   NOB:[id number], fentaNYL (SUBLIMAZE) injection, methocarbamol, metoprolol tartrate, prochlorperazine, sodium chloride flush, traMADol  Assessment/Plan: 1) 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 2) Severe chronic constipation worsened with recent chemotherapy-on GoLytely which she has not consumed much of.  No invasive procedures are recommended at this  time due to her severe anemia and due to neutropenia. 3) History of Karlene Lineman question cirrhosis on recent CT-low up on this on an outpatient basis after discharge. 4) Breast cancer currently on chemotherapy. 5) Anxiety on Klonopin. 6) Obesity. 7) Hypoalbuminemia. 8) Oral thrush-we will need treatment with Diflucan.  LOS: 5 days   Juanita Craver 09/20/2021, 7:25 AM

## 2021-09-20 NOTE — Progress Notes (Signed)
She says that she still has no bowel movement.  She says she is passing gas.  She is still neutropenic.  Her white cell count is coming up slowly.  She is on Neupogen.  She is not having a lot of abdominal pain.  She feels like she has just a lot of pressure.  Again she thinks she may go to the bathroom today.  Her labs show white cell count 700.  Hemoglobin 9.7.  Platelet count 159,000.  Her absolute neutrophil count is 200.  The sodium is 143.  Potassium 3.6.  BUN 41 creatinine 0.57.  Calcium is 8.7 with an albumin of 2.7.  She has had no vomiting.  She says that the methocarbamol works well.  She says it tends to relax her abdomen.  Her vital signs are temperature 97.9.  Pulse 101.  Blood pressure 135/68.  Her abdominal exam is soft.  I really cannot hear much in way of bowel sounds.  There is a little bit of tenderness over on the left side.  There is no obvious fluid wave.  There is no palpable liver or spleen tip.  Lungs are clear.  Cardiac exam regular rate and rhythm.  She has neutropenia right now.  This is slowly resolving.  Again, I would not recommend any invasive procedures for her right now.  Again no enemas.  Hopefully she will start having bowel movements.  I appreciate everybody's help with Christie Thomas.   Christie Haw, MD  Philippians 4:13

## 2021-09-20 NOTE — Progress Notes (Signed)
Patient's WBC is 0.7, it was 0.6 yesterday, and her absolute neutrophil is now 0.2, it was 2.7 yesterday. Notified Ouma NP without any new order.

## 2021-09-20 NOTE — Progress Notes (Signed)
PROGRESS NOTE    Christie Thomas  LXB:[id number] DOB: 23-Jun-1955 DOA: 08/30/2021 PCP: Volanda Napoleon, MD   Brief Narrative:  66 year old female with history of breast cancer treated with chemotherapy, rheumatoid arthritis, gastroparesis, hyperlipidemia, fatty liver, recent admission from 08/01/2021-08/07/2021 for sepsis secondary to neutropenic fever/acute diverticulitis/UTI treated with IV meropenem and subsequent oral Augmentin presented with worsening abdomen, constipation and cramping.  On presentation, CT of the abdomen and pelvis showed formed stool in the sigmoid and descending colon, moderate distention and pericolonic stranding with evidence of diffuse colitis, however mostly with hard stool.  She was started on IV Zosyn.  GI was consulted.  Oncology was consulted as well.  Assessment & Plan:   Diffuse colitis, concern for chemotherapy-induced mucosal inflammation Concern for stercoral colitis Diffuse abdominal pain Significant constipation -Currently on Zosyn empirically for colitis.  Unclear if this is colitis from chemotherapy induced mucosal inflammation, bacterial infection versus stercoral colitis -Still has not had bowel movement yet.  GI following.  Patient could not complete bowel.  Patient apparently cannot tolerate MiraLAX.  Continue Senokot -Continue pain management  Breast cancer currently undergoing chemotherapy -Oncology following.  Recently received Neupogen  Sinus tachycardia -Has had episodes of intermittent sinus tachycardia since 09/18/2021.  Use IV metoprolol if needed.  Leukocytosis -Possibly from Neupogen use.  Resolved  Leukopenia -Leukopenia has worsened.  WBC 0.7.  Currently on Neupogen as per oncology.  Anemia of chronic disease -From cancer and chemotherapy.  Hypokalemia -Improved.  Hyperlipidemia -hold statin and fenofibrate until GI symptoms improve  Obesity -Outpatient follow-up  Anxiety -Continue Klonopin at night     DVT  prophylaxis: Lovenox Code Status: Full Family Communication: Husband at bedside on 09/18/2021 Disposition Plan: Status is: Inpatient Remains inpatient appropriate because: Of severity of illness    Consultants: GI/oncology  Procedures: None  Antimicrobials: Zosyn   Subjective: Patient seen and examined at bedside.  Has not had a bowel movement yet but feels that she might have it today.  Denies any worsening abdominal pain, fever, vomiting or shortness of breath.  Feels very weak. Objective: Vitals:   09/19/21 1427 09/19/21 2008 09/19/21 2210 09/20/21 0620  BP: 123/61  124/70 135/68  Pulse: (!) 103  (!) 107 (!) 101  Resp: 17     Temp: 98.6 F (37 C)  99.3 F (37.4 C) 97.9 F (36.6 C)  TempSrc: Oral  Oral Oral  SpO2: 97% 96% 97% 96%  Weight:      Height:        Intake/Output Summary (Last 24 hours) at 09/20/2021 0733 Last data filed at 09/20/2021 0700 Gross per 24 hour  Intake 999.96 ml  Output --  Net 999.96 ml    Filed Weights   08/28/2021 0728  Weight: 81.6 kg    Examination:  General: Acute distress.  Currently on 1 L oxygen via nasal cannula.  Chronically ill and deconditioned. ENT/neck: No palpable thyromegaly or neck masses.   Respiratory: Bilateral decreased breath sounds at bases with scattered crackles CVS: Still has intermittent tachycardia; S1 and S2 are heard Abdominal: Soft, still has some tenderness in the lower quadrant with mild distention; no organomegaly, bowel sounds heard extremities: No clubbing; mild lower extremity edema bilaterally CNS: Alert and oriented.  No focal neurologic deficit.  Moving extremities Lymph: No palpable lymphadenopathy noted  skin: No obvious rashes/petechiae  psych: Affect is mostly flat.  No signs of agitation.   Musculoskeletal: No obvious other joint swelling/deformity     Data Reviewed: I have personally  reviewed following labs and imaging studies  CBC: Recent Labs  Lab 09/08/2021 0850 09/16/21 0511  09/17/21 0450 09/18/21 0532 09/19/21 0310 09/20/21 0335  WBC 17.8* 10.6* 15.3* 3.2* 0.6* 0.7*  NEUTROABS 15.7*  --  14.0* 2.7 0.2* 0.2*  HGB 13.6 12.1 12.0 11.5* 10.7* 9.7*  HCT 41.6 37.2 37.0 35.0* 32.8* 30.3*  MCV 92.9 94.7 94.6 93.8 94.3 95.0  PLT 315 243 180 174 174 027    Basic Metabolic Panel: Recent Labs  Lab 09/16/21 0511 09/17/21 0450 09/18/21 0532 09/19/21 0310 09/20/21 0335  NA 144 142 139 140 143  K 4.0 3.8 3.7 3.3* 3.6  CL 113* 111 108 108 110  CO2 '24 24 25 23 24  '$ GLUCOSE 133* 121* 157* 139* 114*  BUN 23 19 37* 55* 41*  CREATININE 0.70 0.60 0.70 1.12* 0.57  CALCIUM 8.5* 8.7* 8.6* 8.6* 8.7*  MG  --   --   --   --  2.6*    GFR: Estimated Creatinine Clearance: 71.5 mL/min (by C-G formula based on SCr of 0.57 mg/dL). Liver Function Tests: Recent Labs  Lab 09/16/21 0511 09/17/21 0450 09/18/21 0532 09/19/21 0310 09/20/21 0335  AST '27 27 21 19 16  '$ ALT '23 21 22 19 17  '$ ALKPHOS 42 50 39 30* 30*  BILITOT 1.0 1.5* 1.8* 1.5* 1.1  PROT 6.0* 6.1* 6.0* 5.9* 5.9*  ALBUMIN 3.3* 3.4* 3.0* 2.9* 2.7*    Recent Labs  Lab 09/01/2021 0850  LIPASE 45    No results for input(s): "AMMONIA" in the last 168 hours. Coagulation Profile: No results for input(s): "INR", "PROTIME" in the last 168 hours. Cardiac Enzymes: No results for input(s): "CKTOTAL", "CKMB", "CKMBINDEX", "TROPONINI" in the last 168 hours. BNP (last 3 results) No results for input(s): "PROBNP" in the last 8760 hours. HbA1C: No results for input(s): "HGBA1C" in the last 72 hours. CBG: No results for input(s): "GLUCAP" in the last 168 hours. Lipid Profile: No results for input(s): "CHOL", "HDL", "LDLCALC", "TRIG", "CHOLHDL", "LDLDIRECT" in the last 72 hours. Thyroid Function Tests: No results for input(s): "TSH", "T4TOTAL", "FREET4", "T3FREE", "THYROIDAB" in the last 72 hours. Anemia Panel: No results for input(s): "VITAMINB12", "FOLATE", "FERRITIN", "TIBC", "IRON", "RETICCTPCT" in the last 72  hours. Sepsis Labs: No results for input(s): "PROCALCITON", "LATICACIDVEN" in the last 168 hours.  No results found for this or any previous visit (from the past 240 hour(s)).       Radiology Studies: No results found.      Scheduled Meds:  budesonide (PULMICORT) nebulizer solution  0.25 mg Nebulization BID   Chlorhexidine Gluconate Cloth  6 each Topical Daily   clonazepam  0.25 mg Oral QHS   dexamethasone (DECADRON) injection  4 mg Intravenous Q12H   diphenhydrAMINE  25 mg Intravenous Once   enoxaparin (LOVENOX) injection  40 mg Subcutaneous QHS   famotidine  40 mg Oral BID   feeding supplement  237 mL Oral BID BM   fluconazole  100 mg Oral Daily   multivitamin with minerals  1 tablet Oral Daily   polyethylene glycol  17 g Oral Once   potassium chloride  40 mEq Oral Once   protein supplement  1 Scoop Oral TID WC   senna-docusate  2 tablet Oral BID   sodium phosphate  1 enema Rectal Once   Tbo-Filgrastim  480 mcg Subcutaneous q1800   Continuous Infusions:  sodium chloride 75 mL/hr at 09/19/21 2128   piperacillin-tazobactam (ZOSYN)  IV 3.375 g (09/20/21 0524)  Aline August, MD [facility] 09/20/2021, 7:33 AM

## 2021-09-21 DIAGNOSIS — K5900 Constipation, unspecified: Secondary | ICD-10-CM | POA: Diagnosis not present

## 2021-09-21 DIAGNOSIS — R1084 Generalized abdominal pain: Secondary | ICD-10-CM | POA: Diagnosis not present

## 2021-09-21 DIAGNOSIS — E876 Hypokalemia: Secondary | ICD-10-CM

## 2021-09-21 DIAGNOSIS — D638 Anemia in other chronic diseases classified elsewhere: Secondary | ICD-10-CM | POA: Diagnosis not present

## 2021-09-21 DIAGNOSIS — K5289 Other specified noninfective gastroenteritis and colitis: Secondary | ICD-10-CM | POA: Diagnosis not present

## 2021-09-21 DIAGNOSIS — C50211 Malignant neoplasm of upper-inner quadrant of right female breast: Secondary | ICD-10-CM | POA: Diagnosis not present

## 2021-09-21 LAB — CBC WITH DIFFERENTIAL/PLATELET
Abs Immature Granulocytes: 0.66 10*3/uL — ABNORMAL HIGH (ref 0.00–0.07)
Abs Immature Granulocytes: 1.9 10*3/uL — ABNORMAL HIGH (ref 0.00–0.07)
Band Neutrophils: 4 %
Basophils Absolute: 0 10*3/uL (ref 0.0–0.1)
Basophils Absolute: 0 10*3/uL (ref 0.0–0.1)
Basophils Relative: 0 %
Basophils Relative: 0 %
Eosinophils Absolute: 0 10*3/uL (ref 0.0–0.5)
Eosinophils Absolute: 0 10*3/uL (ref 0.0–0.5)
Eosinophils Relative: 0 %
Eosinophils Relative: 0 %
HCT: 36.5 % (ref 36.0–46.0)
HCT: 41.8 % (ref 36.0–46.0)
Hemoglobin: 11.6 g/dL — ABNORMAL LOW (ref 12.0–15.0)
Hemoglobin: 13.3 g/dL (ref 12.0–15.0)
Immature Granulocytes: 7 %
Lymphocytes Relative: 5 %
Lymphocytes Relative: 14 %
Lymphs Abs: 0.5 10*3/uL — ABNORMAL LOW (ref 0.7–4.0)
Lymphs Abs: 2 10*3/uL (ref 0.7–4.0)
MCH: 30.2 pg (ref 26.0–34.0)
MCH: 30.1 pg (ref 26.0–34.0)
MCHC: 31.8 g/dL (ref 30.0–36.0)
MCHC: 31.8 g/dL (ref 30.0–36.0)
MCV: 95.1 fL (ref 80.0–100.0)
MCV: 94.6 fL (ref 80.0–100.0)
Metamyelocytes Relative: 3 %
Monocytes Absolute: 1.2 10*3/uL — ABNORMAL HIGH (ref 0.1–1.0)
Monocytes Absolute: 1.7 10*3/uL — ABNORMAL HIGH (ref 0.1–1.0)
Monocytes Relative: 12 %
Monocytes Relative: 12 %
Myelocytes: 8 %
Neutro Abs: 7.2 10*3/uL (ref 1.7–7.7)
Neutro Abs: 8.8 10*3/uL — ABNORMAL HIGH (ref 1.7–7.7)
Neutrophils Relative %: 76 %
Neutrophils Relative %: 57 %
Platelets: 164 10*3/uL (ref 150–400)
Platelets: 222 10*3/uL (ref 150–400)
Promyelocytes Relative: 2 %
RBC: 3.84 MIL/uL — ABNORMAL LOW (ref 3.87–5.11)
RBC: 4.42 MIL/uL (ref 3.87–5.11)
RDW: 15.9 % — ABNORMAL HIGH (ref 11.5–15.5)
RDW: 16.2 % — ABNORMAL HIGH (ref 11.5–15.5)
WBC: 9.5 10*3/uL (ref 4.0–10.5)
WBC: 14.5 10*3/uL — ABNORMAL HIGH (ref 4.0–10.5)
nRBC: 0.5 % — ABNORMAL HIGH (ref 0.0–0.2)
nRBC: 2.6 % — ABNORMAL HIGH (ref 0.0–0.2)

## 2021-09-21 LAB — COMPREHENSIVE METABOLIC PANEL
ALT: 20 U/L (ref 0–44)
AST: 25 U/L (ref 15–41)
Albumin: 2.5 g/dL — ABNORMAL LOW (ref 3.5–5.0)
Alkaline Phosphatase: 61 U/L (ref 38–126)
Anion gap: 8 (ref 5–15)
BUN: 32 mg/dL — ABNORMAL HIGH (ref 8–23)
CO2: 24 mmol/L (ref 22–32)
Calcium: 8.6 mg/dL — ABNORMAL LOW (ref 8.9–10.3)
Chloride: 113 mmol/L — ABNORMAL HIGH (ref 98–111)
Creatinine, Ser: 0.54 mg/dL (ref 0.44–1.00)
GFR, Estimated: >60 mL/min (ref >60)
Glucose, Bld: 116 mg/dL — ABNORMAL HIGH (ref 70–99)
Potassium: 3.5 mmol/L (ref 3.5–5.1)
Sodium: 145 mmol/L (ref 135–145)
Total Bilirubin: 1.3 mg/dL — ABNORMAL HIGH (ref 0.3–1.2)
Total Protein: 5.7 g/dL — ABNORMAL LOW (ref 6.5–8.1)

## 2021-09-21 LAB — MAGNESIUM: Magnesium: 2.2 mg/dL (ref 1.7–2.4)

## 2021-09-21 LAB — GLUCOSE, CAPILLARY: Glucose-Capillary: 124 mg/dL — ABNORMAL HIGH (ref 70–99)

## 2021-09-21 MED ORDER — METOPROLOL TARTRATE 5 MG/5ML IV SOLN
2.5000 mg | Freq: Once | INTRAVENOUS | Status: AC | PRN
  Administered 2021-09-22: 2.5 mg via INTRAVENOUS

## 2021-09-21 MED ORDER — MAGNESIUM HYDROXIDE 400 MG/5ML PO SUSP
960.0000 mL | Freq: Once | ORAL | Status: AC
Start: 2021-09-21 — End: 2021-09-21
  Administered 2021-09-21: 960 mL via RECTAL
  Filled 2021-09-21: qty 473

## 2021-09-21 MED ORDER — FUROSEMIDE 10 MG/ML IJ SOLN
60.0000 mg | Freq: Once | INTRAMUSCULAR | Status: AC
Start: 2021-09-21 — End: 2021-09-21
  Administered 2021-09-21: 60 mg via INTRAVENOUS
  Filled 2021-09-21: qty 6

## 2021-09-21 MED ORDER — ORAL CARE MOUTH RINSE: 15.0000 mL | OROMUCOSAL | Status: DC | PRN

## 2021-09-21 MED ORDER — ORAL CARE MOUTH RINSE
15.0000 mL | OROMUCOSAL | Status: DC
  Administered 2021-09-21 (×4): 15 mL via OROMUCOSAL

## 2021-09-21 MED ORDER — CLONAZEPAM 0.125 MG PO TBDP
0.2500 mg | ORAL_TABLET | Freq: Once | ORAL | Status: AC | PRN
  Administered 2021-09-22: 0.25 mg via ORAL
  Filled 2021-09-21: qty 2

## 2021-09-21 MED ORDER — CLONAZEPAM 0.125 MG PO TBDP
0.2500 mg | ORAL_TABLET | Freq: Three times a day (TID) | ORAL | Status: DC | PRN
  Administered 2021-09-21: 0.25 mg via ORAL
  Filled 2021-09-21 (×2): qty 2

## 2021-09-21 MED ORDER — FUROSEMIDE 10 MG/ML IJ SOLN
20.0000 mg | Freq: Once | INTRAMUSCULAR | Status: AC
  Administered 2021-09-21: 20 mg via INTRAVENOUS
  Filled 2021-09-21: qty 2

## 2021-09-21 NOTE — Progress Notes (Addendum)
The  patient is gaging and having dry heaves unrelieved with Compazine IV .  Also noted thrush in her mouth during my shift assessment . Notified Olena Heckle  NP and received IV Zofran x one dose Nystatin as indicated in the [facility]. Will administer and continue to monitor.

## 2021-09-21 NOTE — Progress Notes (Signed)
   09/21/21 2230  Assess: MEWS Score  Temp (!) 97.5 F (36.4 C) (RN at bedside while taking vitals)  BP (!) 124/57  MAP (mmHg) 75  Pulse Rate (!) 136  Resp (!) 25  SpO2 94 %  O2 Device Nasal Cannula  Assess: MEWS Score  MEWS Temp 0  MEWS Systolic 0  MEWS Pulse 3  MEWS RR 1  MEWS LOC 0  MEWS Score 4  MEWS Score Color Red  Assess: if the MEWS score is Yellow or Red  Were vital signs taken at a resting state? Yes  Focused Assessment Change from prior assessment (see assessment flowsheet)  Does the patient meet 2 or more of the SIRS criteria? Yes  Does the patient have a confirmed or suspected source of infection? Yes  Provider and Rapid Response Notified? Yes  MEWS guidelines implemented *See Row Information* Yes  Treat  MEWS Interventions Administered scheduled meds/treatments;Administered prn meds/treatments;Escalated (See documentation below)  Pain Scale 0-10  Pain Score 10  Take Vital Signs  Increase Vital Sign Frequency  Yellow: Q 2hr X 2 then Q 4hr X 2, if remains yellow, continue Q 4hrs  Escalate  MEWS: Escalate Red: discuss with charge nurse/RN and provider, consider discussing with RRT  Notify: Charge Nurse/RN  Name of Charge Nurse/RN Notified Calvin RN  Date Charge Nurse/RN Notified 09/21/21  Time Charge Nurse/RN Notified 2258  Notify: Provider  Provider Name/Title Chinita Greenland  Date Provider Notified 09/21/21  Time Provider Notified 2257  Method of Notification Page  Notification Reason Change in status  Provider response En route  Date of Provider Response 09/21/21  Time of Provider Response 2301  Notify: Rapid Response  Name of Rapid Response RN Notified Loney Laurence RN  Date Rapid Response Notified 09/21/21  Time Rapid Response Notified 2300  Document  Patient Outcome Other (Comment) (Orders still comng in from [place] NP)  Progress note created (see row info) Yes  Assess: SIRS CRITERIA  SIRS Temperature  0  SIRS Pulse 1  SIRS Respirations  1   SIRS WBC 1  SIRS Score Sum  3   The patient is Tachycardia and tachypnea as noted in the flow sheet. Mews red noted, protocol followed and new orders received. Will continue to monitor.

## 2021-09-21 NOTE — Progress Notes (Signed)
Dropped 1 tablet of Klonopin on the floor, will waste medication with witness, Vivi Ferns RN. New tablet removed from pyxis.

## 2021-09-21 NOTE — Progress Notes (Signed)
   09/21/21 1427  Assess: MEWS Score  Temp 97.6 F (36.4 C)  BP 116/78  MAP (mmHg) 90  Pulse Rate (!) 132  Resp 20  SpO2 96 %  O2 Device Room Air  Assess: MEWS Score  MEWS Temp 0  MEWS Systolic 0  MEWS Pulse 3  MEWS RR 0  MEWS LOC 0  MEWS Score 3  MEWS Score Color Yellow  Assess: if the MEWS score is Yellow or Red  Were vital signs taken at a resting state? Yes  Focused Assessment No change from prior assessment  Does the patient meet 2 or more of the SIRS criteria? No  Does the patient have a confirmed or suspected source of infection? No  Provider and Rapid Response Notified? Yes  MEWS guidelines implemented *See Row Information* Yes  Treat  MEWS Interventions Administered prn meds/treatments  Take Vital Signs  Increase Vital Sign Frequency  Yellow: Q 2hr X 2 then Q 4hr X 2, if remains yellow, continue Q 4hrs  Escalate  MEWS: Escalate Yellow: discuss with charge nurse/RN and consider discussing with provider and RRT  Notify: Charge Nurse/RN  Name of Charge Nurse/RN Notified Vivi Ferns RN  Date Charge Nurse/RN Notified 09/21/21  Time Charge Nurse/RN Notified 1445  Notify: Rapid Response  Name of Rapid Response RN Notified Christian RN  Date Rapid Response Notified 09/21/21  Time Rapid Response Notified 1600  Document  Progress note created (see row info) Yes  Assess: SIRS CRITERIA  SIRS Temperature  0  SIRS Pulse 1  SIRS Respirations  0  SIRS WBC 0  SIRS Score Sum  1   Patient triggered yellow MEWS due to increased heart rate. IV Metoprolol 2.5 mg administered and yellow MEWS guidelines implemented. Charge nurse [place] and rapid response nurse [employer]. Will continue to monitor.

## 2021-09-21 NOTE — Progress Notes (Signed)
PROGRESS NOTE    GEARLINE SPILMAN  HUD:[id number] DOB: [birth year] DOA: 08/31/2021 PCP: Volanda Napoleon, MD   Brief Narrative:  66 year old female with history of breast cancer treated with chemotherapy, rheumatoid arthritis, gastroparesis, hyperlipidemia, fatty liver, recent admission from 08/01/2021-08/07/2021 for sepsis secondary to neutropenic fever/acute diverticulitis/UTI treated with IV meropenem and subsequent oral Augmentin presented with worsening abdomen, constipation and cramping.  On presentation, CT of the abdomen and pelvis showed formed stool in the sigmoid and descending colon, moderate distention and pericolonic stranding with evidence of diffuse colitis, however mostly with hard stool.  She was started on IV Zosyn.  GI was consulted.  Oncology was consulted as well.  Assessment & Plan:   Diffuse colitis, concern for chemotherapy-induced mucosal inflammation Concern for stercoral colitis Diffuse abdominal pain Significant constipation -Currently on Zosyn empirically for colitis.  Unclear if this is colitis from chemotherapy induced mucosal inflammation, bacterial infection versus stercoral colitis -Still has not had bowel movement yet.  GI following.  Patient could not complete bowel.  Patient apparently cannot tolerate MiraLAX.  Continue Senokot -Continue pain management  Breast cancer currently undergoing chemotherapy -Oncology following.    Sinus tachycardia -Has had episodes of intermittent sinus tachycardia since 09/18/2021.  Use IV metoprolol if needed.  Fluid overload -Patient has been on fluids IV since admission and is currently showing signs of fluid overload including lower extremity swelling and some hypoxia.  Will give 1 dose of IV Lasix 60 mg.  Leukocytosis -Possibly from Neupogen use.  Resolved  Leukopenia -Resolved.  WBC 9.5 today.  Currently on Neupogen as per oncology.  Anemia of chronic disease -From cancer and  chemotherapy.  Hypokalemia -Improved.  Hyperlipidemia -hold statin and fenofibrate until GI symptoms improve  Obesity -Outpatient follow-up  Anxiety -Continue Klonopin at night    DVT prophylaxis: Lovenox Code Status: Full Family Communication: Husband at bedside on 09/18/2021 Disposition Plan: Status is: Inpatient Remains inpatient appropriate because: Of severity of illness    Consultants: GI/oncology  Procedures: None  Antimicrobials: Zosyn   Subjective: Patient seen and examined at bedside.  Still feels weak.  Complains of intermittent nausea with intermittent abdominal pain.  Patient passed dyspnea overnight but no significant bowel movement.  Passing flatus.  Intermittently short of breath.  No fever reported. Objective: Vitals:   09/20/21 1939 09/20/21 2121 09/21/21 0603 09/21/21 0708  BP:  133/78 (!) 160/84   Pulse:  95 (!) 116 98  Resp:  18 20   Temp:  98.2 F (36.8 C) 98.6 F (37 C)   TempSrc:   Oral   SpO2: 98% 99% 94%   Weight:      Height:        Intake/Output Summary (Last 24 hours) at 09/21/2021 0806 Last data filed at 09/21/2021 0400 Gross per 24 hour  Intake 1453.63 ml  Output --  Net 1453.63 ml    Filed Weights   08/23/2021 0728  Weight: 81.6 kg    Examination:  General: On 2 L oxygen via nasal cannula.  No acute distress.  Chronically ill and deconditioned. ENT/neck: No JVD elevation noted currently.  No palpable neck masses Respiratory: Decreased breath sounds at bases bilaterally with some crackles CVS: S1-S2 heard; tachycardic intermittently  abdominal: Soft, distended; lower quadrant tenderness present; no organomegaly, bowel sounds are heard  extremities: Bilateral lower extremity edema present; no cyanosis CNS: Awake and alert.  No focal neurologic deficit.  Able to move extremities Lymph: No obvious lymphadenopathy noted  skin: No obvious ecchymosis/lesions  psych: Currently not agitated.  Flat affect currently.    Musculoskeletal: No obvious other joint swelling/deformity     Data Reviewed: I have personally reviewed following labs and imaging studies  CBC: Recent Labs  Lab 09/17/21 0450 09/18/21 0532 09/19/21 0310 09/20/21 0335 09/21/21 0700  WBC 15.3* 3.2* 0.6* 0.7* 9.5  NEUTROABS 14.0* 2.7 0.2* 0.2* PENDING  HGB 12.0 11.5* 10.7* 9.7* 11.6*  HCT 37.0 35.0* 32.8* 30.3* 36.5  MCV 94.6 93.8 94.3 95.0 95.1  PLT 180 174 174 159 532    Basic Metabolic Panel: Recent Labs  Lab 09/17/21 0450 09/18/21 0532 09/19/21 0310 09/20/21 0335 09/21/21 0700  NA 142 139 140 143 145  K 3.8 3.7 3.3* 3.6 3.5  CL 111 108 108 110 113*  CO2 '24 25 23 24 24  '$ GLUCOSE 121* 157* 139* 114* 116*  BUN 19 37* 55* 41* 32*  CREATININE 0.60 0.70 1.12* 0.57 0.54  CALCIUM 8.7* 8.6* 8.6* 8.7* 8.6*  MG  --   --   --  2.6* 2.2    GFR: Estimated Creatinine Clearance: 71.5 mL/min (by C-G formula based on SCr of 0.54 mg/dL). Liver Function Tests: Recent Labs  Lab 09/17/21 0450 09/18/21 0532 09/19/21 0310 09/20/21 0335 09/21/21 0700  AST '27 21 19 16 25  '$ ALT '21 22 19 17 20  '$ ALKPHOS 50 39 30* 30* 61  BILITOT 1.5* 1.8* 1.5* 1.1 1.3*  PROT 6.1* 6.0* 5.9* 5.9* 5.7*  ALBUMIN 3.4* 3.0* 2.9* 2.7* 2.5*    Recent Labs  Lab 09/05/2021 0850  LIPASE 45    No results for input(s): "AMMONIA" in the last 168 hours. Coagulation Profile: No results for input(s): "INR", "PROTIME" in the last 168 hours. Cardiac Enzymes: No results for input(s): "CKTOTAL", "CKMB", "CKMBINDEX", "TROPONINI" in the last 168 hours. BNP (last 3 results) No results for input(s): "PROBNP" in the last 8760 hours. HbA1C: No results for input(s): "HGBA1C" in the last 72 hours. CBG: No results for input(s): "GLUCAP" in the last 168 hours. Lipid Profile: No results for input(s): "CHOL", "HDL", "LDLCALC", "TRIG", "CHOLHDL", "LDLDIRECT" in the last 72 hours. Thyroid Function Tests: No results for input(s): "TSH", "T4TOTAL", "FREET4", "T3FREE",  "THYROIDAB" in the last 72 hours. Anemia Panel: No results for input(s): "VITAMINB12", "FOLATE", "FERRITIN", "TIBC", "IRON", "RETICCTPCT" in the last 72 hours. Sepsis Labs: No results for input(s): "PROCALCITON", "LATICACIDVEN" in the last 168 hours.  No results found for this or any previous visit (from the past 240 hour(s)).       Radiology Studies: No results found.      Scheduled Meds:  budesonide (PULMICORT) nebulizer solution  0.25 mg Nebulization BID   Chlorhexidine Gluconate Cloth  6 each Topical Daily   clonazepam  0.25 mg Oral QHS   dexamethasone (DECADRON) injection  4 mg Intravenous Q12H   diphenhydrAMINE  25 mg Intravenous Once   enoxaparin (LOVENOX) injection  40 mg Subcutaneous QHS   famotidine  40 mg Oral BID   feeding supplement  237 mL Oral BID BM   fluconazole  100 mg Oral Daily   magic mouthwash  5 mL Oral TID   multivitamin with minerals  1 tablet Oral Daily   mouth rinse  15 mL Mouth Rinse 4 times per day   polyethylene glycol  17 g Oral Once   potassium chloride  40 mEq Oral Once   protein supplement  1 Scoop Oral TID WC   senna-docusate  2 tablet Oral BID   sodium phosphate  1 enema  Rectal Once   Tbo-Filgrastim  480 mcg Subcutaneous q1800   Continuous Infusions:  sodium chloride 75 mL/hr at 09/21/21 0612   piperacillin-tazobactam (ZOSYN)  IV 3.375 g (09/21/21 4039)          Aline August, MD [facility] 09/21/2021, 8:06 AM

## 2021-09-21 NOTE — Progress Notes (Signed)
      Progress Note   Subjective  Patient had a "smear" she passed overnight but no significant bowel movement. Has ongoing lower abdominal discomfort, has some nausea. WBC improved this AM. She is passing gas. She is nauseated.   Objective   Vital signs in last 24 hours: Temp:  [98.2 F (36.8 C)-98.6 F (37 C)] 98.6 F (37 C) (07/04 0603) Pulse Rate:  [95-116] 98 (07/04 0708) Resp:  [18-20] 20 (07/04 0603) BP: (133-160)/(69-84) 160/84 (07/04 0603) SpO2:  [94 %-99 %] 94 % (07/04 0603) Last BM Date : 09/17/21 General:    white female in NAD Abdomen:  Soft, lower abdominal mild TTP,  Psych:  Cooperative. Normal mood and affect.  Intake/Output from previous day: 07/03 0701 - 07/04 0700 In: 1499.9 [P.O.:480; I.V.:889.8; IV Piggyback:130.1] Out: -  Intake/Output this shift: No intake/output data recorded.  Lab Results: Recent Labs    09/19/21 0310 09/20/21 0335 09/21/21 0700  WBC 0.6* 0.7* 9.5  HGB 10.7* 9.7* 11.6*  HCT 32.8* 30.3* 36.5  PLT 174 159 164   BMET Recent Labs    09/19/21 0310 09/20/21 0335 09/21/21 0700  NA 140 143 145  K 3.3* 3.6 3.5  CL 108 110 113*  CO2 '23 24 24  '$ GLUCOSE 139* 114* 116*  BUN 55* 41* 32*  CREATININE 1.12* 0.57 0.54  CALCIUM 8.6* 8.7* 8.6*   LFT Recent Labs    09/21/21 0700  PROT 5.7*  ALBUMIN 2.5*  AST 25  ALT 20  ALKPHOS 61  BILITOT 1.3*   PT/INR No results for input(s): "LABPROT", "INR" in the last 72 hours.  Studies/Results: No results found.     Assessment / Plan:    66 y/o female with breast cancer on chemotherapy complicated by pancytopenia / neutropenia, with ongoing constipation in the setting of suspected taxotere related colitis / diverticulitis, possible stercoral changes / impaction. On antibiotics. She has not done well with oral laxatives due to poor oral intake.  No interventions have been done per rectum thus far given neutropenia, but her neutropenia appears much better than expected today  compared to yesterday. Would repeat CBC to make sure the WBC is accurate / correct and no neutropenia, and if so, recommend enemas - SMOG or mineral oil to start. Hopefully this will help her. If not successful or does not tolerate due to pain, and WBC remains okay, may need disimpaction under anesthesia. Will await trial of enemas first. Continue anti-emetics. Discussed with Dr. Starla Link and nursing staff.  Dr. Collene Mares / Benson Norway to re-assume her GI care tomorrow.  Jolly Mango, MD [facility]

## 2021-09-21 NOTE — Significant Event (Incomplete)
Rapid Response Event Note   Reason for Call :  Patient with constipation, elevated respirations and HR, severe pain.  Initial Focused Assessment:  Metoprolol and Fentanyl recently given per MAR. Patient alert/oriented x 4 although responses are slow. Patient reported reduction of pain severity from 6 down to 4 out of 10. Patient reports labored breathing. Lungs clear bilaterally, patient wearing O2 [place] at 2L/min, HR regular but in 120's apical; Hypoactive bowel sounds. Trace - 1+ peripheral edema found lower extremities. CBG 124. EKG sinus tachycardia-HR 120. Bladder scan revealed 5-20 ml in bladder. Patient has IV NS running at 75 ml, but this was reduced to 20 ml by provider. Pain increased back to 5/10.  Interventions:  Gershon Cull, NP at bedside. Chart and assessment reviewed. EKG, CBG, Bladder scan done. Telemetry monitoring initiated. Repeat VS done.  Orders given for Lasix, Metoprolol, and Morphine. Doses given - see MAR. [place] initiated to monitor output.  Plan of Care:  Patient reported reduction of pain & sensation of dyspnea. Patient will remain on telemetry and RN will report any unmanaged symptoms or further concerns to rapid nurse and provider.  Event Summary:   MD Notified: Gershon Cull, NP Call Time: [place] Time: 2305 End Time: [place], RN  Addendum: 9983 09/27/2021  Returned to bedside of patient at nurse's request at approximately 3:30am with Gershon Cull, NP. Joined by Dr. [place] Crane. Patient vitals and symptoms similar to previous assessment with addition of patient pulling at medical devices. Additional labs and xrays ordered by providers. Lasix and Ativan given per prescribed orders by providers. Per Dr. Nevada Crane, patient will remain in current bed assignment for now while monitoring effectiveness of interventions and outcomes of diagnostics.  Second Addendum: Lactic acid 9.0, patient to be transferred to ICU/SD. Dr. Nevada Crane ordered CT scan and  consulted surgery and critical care.

## 2021-09-22 ENCOUNTER — Inpatient Hospital Stay (HOSPITAL_COMMUNITY): Payer: BC Managed Care – PPO

## 2021-09-22 ENCOUNTER — Inpatient Hospital Stay (HOSPITAL_COMMUNITY): Payer: BC Managed Care – PPO | Admitting: Certified Registered Nurse Anesthetist

## 2021-09-22 ENCOUNTER — Encounter (HOSPITAL_COMMUNITY): Admission: EM | Disposition: E | Payer: Self-pay | Source: Home / Self Care | Attending: Internal Medicine

## 2021-09-22 ENCOUNTER — Other Ambulatory Visit: Payer: Self-pay

## 2021-09-22 DIAGNOSIS — R4182 Altered mental status, unspecified: Secondary | ICD-10-CM

## 2021-09-22 DIAGNOSIS — R1084 Generalized abdominal pain: Secondary | ICD-10-CM | POA: Diagnosis not present

## 2021-09-22 DIAGNOSIS — A419 Sepsis, unspecified organism: Secondary | ICD-10-CM | POA: Diagnosis not present

## 2021-09-22 DIAGNOSIS — I3139 Other pericardial effusion (noninflammatory): Secondary | ICD-10-CM | POA: Diagnosis not present

## 2021-09-22 DIAGNOSIS — J9601 Acute respiratory failure with hypoxia: Secondary | ICD-10-CM

## 2021-09-22 DIAGNOSIS — N170 Acute kidney failure with tubular necrosis: Secondary | ICD-10-CM

## 2021-09-22 DIAGNOSIS — R799 Abnormal finding of blood chemistry, unspecified: Secondary | ICD-10-CM | POA: Diagnosis not present

## 2021-09-22 DIAGNOSIS — Z7901 Long term (current) use of anticoagulants: Secondary | ICD-10-CM

## 2021-09-22 DIAGNOSIS — R942 Abnormal results of pulmonary function studies: Secondary | ICD-10-CM

## 2021-09-22 DIAGNOSIS — K5792 Diverticulitis of intestine, part unspecified, without perforation or abscess without bleeding: Secondary | ICD-10-CM | POA: Diagnosis not present

## 2021-09-22 HISTORY — PX: LAPAROTOMY: SHX154

## 2021-09-22 LAB — GLUCOSE, CAPILLARY
Glucose-Capillary: 40 mg/dL — CL (ref 70–99)
Glucose-Capillary: 130 mg/dL — ABNORMAL HIGH (ref 70–99)
Glucose-Capillary: 121 mg/dL — ABNORMAL HIGH (ref 70–99)
Glucose-Capillary: 119 mg/dL — ABNORMAL HIGH (ref 70–99)
Glucose-Capillary: 94 mg/dL (ref 70–99)
Glucose-Capillary: <10 mg/dL — CL (ref 70–99)
Glucose-Capillary: 43 mg/dL — CL (ref 70–99)
Glucose-Capillary: 109 mg/dL — ABNORMAL HIGH (ref 70–99)

## 2021-09-22 LAB — BLOOD GAS, ARTERIAL
Acid-base deficit: 11.6 mmol/L — ABNORMAL HIGH (ref 0.0–2.0)
Acid-base deficit: 13.4 mmol/L — ABNORMAL HIGH (ref 0.0–2.0)
Acid-base deficit: 15.8 mmol/L — ABNORMAL HIGH (ref 0.0–2.0)
Acid-base deficit: 17.7 mmol/L — ABNORMAL HIGH (ref 0.0–2.0)
Acid-base deficit: 17.7 mmol/L — ABNORMAL HIGH (ref 0.0–2.0)
Acid-base deficit: 18.5 mmol/L — ABNORMAL HIGH (ref 0.0–2.0)
Bicarbonate: 11.1 mmol/L — ABNORMAL LOW (ref 20.0–28.0)
Bicarbonate: 11.3 mmol/L — ABNORMAL LOW (ref 20.0–28.0)
Bicarbonate: 12.9 mmol/L — ABNORMAL LOW (ref 20.0–28.0)
Bicarbonate: 8 mmol/L — ABNORMAL LOW (ref 20.0–28.0)
Bicarbonate: 8.6 mmol/L — ABNORMAL LOW (ref 20.0–28.0)
Bicarbonate: 9 mmol/L — ABNORMAL LOW (ref 20.0–28.0)
Drawn by: 54496
Drawn by: 54496
FIO2: 100 %
FIO2: 60 %
MECHVT: 430 mL
MECHVT: 500 mL
O2 Saturation: 90.3 %
O2 Saturation: 92.2 %
O2 Saturation: 94 %
O2 Saturation: 94.7 %
O2 Saturation: 96.6 %
O2 Saturation: 97.8 %
PEEP: 5 cmH2O
PEEP: 5 cmH2O
Patient temperature: 34.9
Patient temperature: 36.3
Patient temperature: 36.4
Patient temperature: 36.5
Patient temperature: 37.1
Patient temperature: 37.6
RATE: 28 resp/min
RATE: 34 resp/min
pCO2 arterial: 20 mmHg — ABNORMAL LOW (ref 32–48)
pCO2 arterial: 23 mmHg — ABNORMAL LOW (ref 32–48)
pCO2 arterial: 23 mmHg — ABNORMAL LOW (ref 32–48)
pCO2 arterial: 23 mmHg — ABNORMAL LOW (ref 32–48)
pCO2 arterial: 25 mmHg — ABNORMAL LOW (ref 32–48)
pCO2 arterial: 29 mmHg — ABNORMAL LOW (ref 32–48)
pH, Arterial: 7.15 — CL (ref 7.35–7.45)
pH, Arterial: 7.19 — CL (ref 7.35–7.45)
pH, Arterial: 7.19 — CL (ref 7.35–7.45)
pH, Arterial: 7.22 — ABNORMAL LOW (ref 7.35–7.45)
pH, Arterial: 7.29 — ABNORMAL LOW (ref 7.35–7.45)
pH, Arterial: 7.35 (ref 7.35–7.45)
pO2, Arterial: 111 mmHg — ABNORMAL HIGH (ref 83–108)
pO2, Arterial: 62 mmHg — ABNORMAL LOW (ref 83–108)
pO2, Arterial: 64 mmHg — ABNORMAL LOW (ref 83–108)
pO2, Arterial: 65 mmHg — ABNORMAL LOW (ref 83–108)
pO2, Arterial: 78 mmHg — ABNORMAL LOW (ref 83–108)
pO2, Arterial: 91 mmHg (ref 83–108)

## 2021-09-22 LAB — CBC
HCT: 36.1 % (ref 36.0–46.0)
Hemoglobin: 11.5 g/dL — ABNORMAL LOW (ref 12.0–15.0)
MCH: 30.4 pg (ref 26.0–34.0)
MCHC: 31.9 g/dL (ref 30.0–36.0)
MCV: 95.5 fL (ref 80.0–100.0)
Platelets: 85 10*3/uL — ABNORMAL LOW (ref 150–400)
RBC: 3.78 MIL/uL — ABNORMAL LOW (ref 3.87–5.11)
RDW: 16.7 % — ABNORMAL HIGH (ref 11.5–15.5)
WBC: 38 10*3/uL — ABNORMAL HIGH (ref 4.0–10.5)
nRBC: 6.2 % — ABNORMAL HIGH (ref 0.0–0.2)

## 2021-09-22 LAB — COMPREHENSIVE METABOLIC PANEL
ALT: 72 U/L — ABNORMAL HIGH (ref 0–44)
ALT: 1547 U/L — ABNORMAL HIGH (ref 0–44)
AST: 123 U/L — ABNORMAL HIGH (ref 15–41)
AST: 3570 U/L — ABNORMAL HIGH (ref 15–41)
Albumin: 2.3 g/dL — ABNORMAL LOW (ref 3.5–5.0)
Albumin: 2.2 g/dL — ABNORMAL LOW (ref 3.5–5.0)
Alkaline Phosphatase: 73 U/L (ref 38–126)
Alkaline Phosphatase: 134 U/L — ABNORMAL HIGH (ref 38–126)
Anion gap: 18 — ABNORMAL HIGH (ref 5–15)
Anion gap: 24 — ABNORMAL HIGH (ref 5–15)
BUN: 61 mg/dL — ABNORMAL HIGH (ref 8–23)
BUN: 62 mg/dL — ABNORMAL HIGH (ref 8–23)
CO2: 15 mmol/L — ABNORMAL LOW (ref 22–32)
CO2: 14 mmol/L — ABNORMAL LOW (ref 22–32)
Calcium: 8.9 mg/dL (ref 8.9–10.3)
Calcium: 7.7 mg/dL — ABNORMAL LOW (ref 8.9–10.3)
Chloride: 116 mmol/L — ABNORMAL HIGH (ref 98–111)
Chloride: 109 mmol/L (ref 98–111)
Creatinine, Ser: 1.91 mg/dL — ABNORMAL HIGH (ref 0.44–1.00)
Creatinine, Ser: 2.28 mg/dL — ABNORMAL HIGH (ref 0.44–1.00)
GFR, Estimated: 29 mL/min — ABNORMAL LOW (ref >60)
GFR, Estimated: 23 mL/min — ABNORMAL LOW (ref >60)
Glucose, Bld: 103 mg/dL — ABNORMAL HIGH (ref 70–99)
Glucose, Bld: 161 mg/dL — ABNORMAL HIGH (ref 70–99)
Potassium: 4.1 mmol/L (ref 3.5–5.1)
Potassium: 3.9 mmol/L (ref 3.5–5.1)
Sodium: 149 mmol/L — ABNORMAL HIGH (ref 135–145)
Sodium: 147 mmol/L — ABNORMAL HIGH (ref 135–145)
Total Bilirubin: 1.2 mg/dL (ref 0.3–1.2)
Total Bilirubin: 2 mg/dL — ABNORMAL HIGH (ref 0.3–1.2)
Total Protein: 5.1 g/dL — ABNORMAL LOW (ref 6.5–8.1)
Total Protein: 3.7 g/dL — ABNORMAL LOW (ref 6.5–8.1)

## 2021-09-22 LAB — POCT I-STAT 7, (LYTES, BLD GAS, ICA,H+H)
Acid-base deficit: 18 mmol/L — ABNORMAL HIGH (ref 0.0–2.0)
Acid-base deficit: 17 mmol/L — ABNORMAL HIGH (ref 0.0–2.0)
Bicarbonate: 11.3 mmol/L — ABNORMAL LOW (ref 20.0–28.0)
Bicarbonate: 11.6 mmol/L — ABNORMAL LOW (ref 20.0–28.0)
Calcium, Ion: 1.1 mmol/L — ABNORMAL LOW (ref 1.15–1.40)
Calcium, Ion: 1.09 mmol/L — ABNORMAL LOW (ref 1.15–1.40)
HCT: 34 % — ABNORMAL LOW (ref 36.0–46.0)
HCT: 24 % — ABNORMAL LOW (ref 36.0–46.0)
Hemoglobin: 11.6 g/dL — ABNORMAL LOW (ref 12.0–15.0)
Hemoglobin: 8.2 g/dL — ABNORMAL LOW (ref 12.0–15.0)
O2 Saturation: 99 %
O2 Saturation: 99 %
Potassium: 3.9 mmol/L (ref 3.5–5.1)
Potassium: 4.1 mmol/L (ref 3.5–5.1)
Sodium: 147 mmol/L — ABNORMAL HIGH (ref 135–145)
Sodium: 147 mmol/L — ABNORMAL HIGH (ref 135–145)
TCO2: 12 mmol/L — ABNORMAL LOW (ref 22–32)
TCO2: 13 mmol/L — ABNORMAL LOW (ref 22–32)
pCO2 arterial: 36.9 mmHg (ref 32–48)
pCO2 arterial: 38.3 mmHg (ref 32–48)
pH, Arterial: 7.093 — CL (ref 7.35–7.45)
pH, Arterial: 7.089 — CL (ref 7.35–7.45)
pO2, Arterial: 174 mmHg — ABNORMAL HIGH (ref 83–108)
pO2, Arterial: 180 mmHg — ABNORMAL HIGH (ref 83–108)

## 2021-09-22 LAB — PREPARE RBC (CROSSMATCH)

## 2021-09-22 LAB — CBC WITH DIFFERENTIAL/PLATELET
Abs Immature Granulocytes: 5.62 10*3/uL — ABNORMAL HIGH (ref 0.00–0.07)
Basophils Absolute: 0.1 10*3/uL (ref 0.0–0.1)
Basophils Relative: 0 %
Eosinophils Absolute: 0 10*3/uL (ref 0.0–0.5)
Eosinophils Relative: 0 %
HCT: 44.9 % (ref 36.0–46.0)
Hemoglobin: 14 g/dL (ref 12.0–15.0)
Immature Granulocytes: 17 %
Lymphocytes Relative: 4 %
Lymphs Abs: 1.4 10*3/uL (ref 0.7–4.0)
MCH: 30.2 pg (ref 26.0–34.0)
MCHC: 31.2 g/dL (ref 30.0–36.0)
MCV: 96.8 fL (ref 80.0–100.0)
Monocytes Absolute: 1.2 10*3/uL — ABNORMAL HIGH (ref 0.1–1.0)
Monocytes Relative: 4 %
Neutro Abs: 25.9 10*3/uL — ABNORMAL HIGH (ref 1.7–7.7)
Neutrophils Relative %: 75 %
Platelets: 165 10*3/uL (ref 150–400)
RBC: 4.64 MIL/uL (ref 3.87–5.11)
RDW: 16.7 % — ABNORMAL HIGH (ref 11.5–15.5)
WBC: 34.1 10*3/uL — ABNORMAL HIGH (ref 4.0–10.5)
nRBC: 4.1 % — ABNORMAL HIGH (ref 0.0–0.2)

## 2021-09-22 LAB — ECHOCARDIOGRAM LIMITED
Height: 64 in
S' Lateral: 1.8 cm
Weight: 2878.33 oz

## 2021-09-22 LAB — COOXEMETRY PANEL
Carboxyhemoglobin: 0.9 % (ref 0.5–1.5)
Methemoglobin: <0.7 % (ref 0.0–1.5)
O2 Saturation: 75.3 %
Total hemoglobin: 10.7 g/dL — ABNORMAL LOW (ref 12.0–16.0)

## 2021-09-22 LAB — LACTIC ACID, PLASMA
Lactic Acid, Venous: >9 mmol/L (ref 0.5–1.9)
Lactic Acid, Venous: >9 mmol/L (ref 0.5–1.9)
Lactic Acid, Venous: >9 mmol/L (ref 0.5–1.9)

## 2021-09-22 LAB — ABO/RH: ABO/RH(D): O POS

## 2021-09-22 LAB — DIC (DISSEMINATED INTRAVASCULAR COAGULATION)PANEL
D-Dimer, Quant: 3.09 ug/mL-FEU — ABNORMAL HIGH (ref 0.00–0.50)
Fibrinogen: 336 mg/dL (ref 210–475)
INR: 3.9 — ABNORMAL HIGH (ref 0.8–1.2)
Platelets: 85 10*3/uL — ABNORMAL LOW (ref 150–400)
Prothrombin Time: 37.6 seconds — ABNORMAL HIGH (ref 11.4–15.2)
Smear Review: NONE SEEN
aPTT: 44 seconds — ABNORMAL HIGH (ref 24–36)

## 2021-09-22 LAB — MRSA NEXT GEN BY PCR, NASAL: MRSA by PCR Next Gen: NOT DETECTED

## 2021-09-22 LAB — MAGNESIUM: Magnesium: 3.1 mg/dL — ABNORMAL HIGH (ref 1.7–2.4)

## 2021-09-22 LAB — BRAIN NATRIURETIC PEPTIDE: B Natriuretic Peptide: 200.3 pg/mL — ABNORMAL HIGH (ref 0.0–100.0)

## 2021-09-22 SURGERY — LAPAROTOMY, EXPLORATORY
Anesthesia: General

## 2021-09-22 MED ORDER — SODIUM BICARBONATE 8.4 % IV SOLN
100.0000 meq | Freq: Once | INTRAVENOUS | Status: AC
  Administered 2021-09-22: 100 meq via INTRAVENOUS
  Filled 2021-09-22: qty 100

## 2021-09-22 MED ORDER — FENTANYL CITRATE PF 50 MCG/ML IJ SOSY
PREFILLED_SYRINGE | INTRAMUSCULAR | Status: AC
  Filled 2021-09-22: qty 2

## 2021-09-22 MED ORDER — HEPARIN SODIUM (PORCINE) 1000 UNIT/ML IJ SOLN
INTRAMUSCULAR | Status: AC
  Filled 2021-09-22: qty 1

## 2021-09-22 MED ORDER — ALBUMIN HUMAN 5 % IV SOLN: INTRAVENOUS | Status: DC | PRN

## 2021-09-22 MED ORDER — MIDAZOLAM HCL 2 MG/2ML IJ SOLN: 2.0000 mg | Freq: Once | INTRAMUSCULAR | Status: AC

## 2021-09-22 MED ORDER — PHENYLEPHRINE 80 MCG/ML (10ML) SYRINGE FOR IV PUSH (FOR BLOOD PRESSURE SUPPORT)
PREFILLED_SYRINGE | INTRAVENOUS | Status: AC
  Administered 2021-09-22: 200 ug via INTRAVENOUS
  Filled 2021-09-22: qty 30

## 2021-09-22 MED ORDER — LACTATED RINGERS IV BOLUS
1000.0000 mL | Freq: Once | INTRAVENOUS | Status: AC
  Administered 2021-09-22: 1000 mL via INTRAVENOUS

## 2021-09-22 MED ORDER — MORPHINE SULFATE (PF) 2 MG/ML IV SOLN
1.0000 mg | Freq: Once | INTRAVENOUS | Status: AC
  Administered 2021-09-22: 1 mg via INTRAVENOUS
  Filled 2021-09-22: qty 1

## 2021-09-22 MED ORDER — NOREPINEPHRINE 16 MG/250ML-% IV SOLN
0.0000 ug/min | INTRAVENOUS | Status: DC
  Administered 2021-09-22: 10 ug/min via INTRAVENOUS
  Administered 2021-09-22: 65 ug/min via INTRAVENOUS
  Administered 2021-09-23: 35 ug/min via INTRAVENOUS
  Filled 2021-09-22 (×3): qty 250

## 2021-09-22 MED ORDER — ROCURONIUM BROMIDE 50 MG/5ML IV SOLN
40.0000 mg | Freq: Once | INTRAVENOUS | Status: AC
Start: 2021-09-22 — End: 2021-09-22
  Administered 2021-09-22: 50 mg via INTRAVENOUS

## 2021-09-22 MED ORDER — PHENYLEPHRINE HCL (PRESSORS) 10 MG/ML IV SOLN
INTRAVENOUS | Status: AC
  Filled 2021-09-22: qty 1

## 2021-09-22 MED ORDER — PHENYLEPHRINE 80 MCG/ML (10ML) SYRINGE FOR IV PUSH (FOR BLOOD PRESSURE SUPPORT)
PREFILLED_SYRINGE | INTRAVENOUS | Status: AC
  Administered 2021-09-22: 200 ug via INTRAVENOUS
  Filled 2021-09-22: qty 10

## 2021-09-22 MED ORDER — NALOXONE HCL 0.4 MG/ML IJ SOCT
0.4000 mg | Freq: Once | INTRAMUSCULAR | Status: AC
Start: 2021-09-22 — End: 2021-09-22
  Administered 2021-09-22: 0.4 mg via INTRAMUSCULAR
  Filled 2021-09-22: qty 0.4

## 2021-09-22 MED ORDER — FENTANYL CITRATE PF 50 MCG/ML IJ SOSY: 50.0000 ug | PREFILLED_SYRINGE | Freq: Once | INTRAMUSCULAR | Status: AC

## 2021-09-22 MED ORDER — NOREPINEPHRINE 4 MG/250ML-% IV SOLN
0.0000 ug/min | INTRAVENOUS | Status: DC
  Administered 2021-09-22: 21 ug/min via INTRAVENOUS
  Filled 2021-09-22: qty 250

## 2021-09-22 MED ORDER — ETOMIDATE 2 MG/ML IV SOLN
INTRAVENOUS | Status: AC
  Administered 2021-09-22: 20 mg via INTRAVENOUS
  Filled 2021-09-22: qty 20

## 2021-09-22 MED ORDER — PANTOPRAZOLE SODIUM 40 MG IV SOLR
40.0000 mg | Freq: Every day | INTRAVENOUS | Status: DC
  Administered 2021-09-22: 40 mg via INTRAVENOUS
  Filled 2021-09-22: qty 10

## 2021-09-22 MED ORDER — LACTATED RINGERS IV SOLN: INTRAVENOUS | Status: DC | PRN

## 2021-09-22 MED ORDER — ORAL CARE MOUTH RINSE
15.0000 mL | OROMUCOSAL | Status: DC
  Administered 2021-09-22 – 2021-09-23 (×10): 15 mL via OROMUCOSAL

## 2021-09-22 MED ORDER — FUROSEMIDE 10 MG/ML IJ SOLN
40.0000 mg | Freq: Once | INTRAMUSCULAR | Status: AC
  Administered 2021-09-22: 40 mg via INTRAVENOUS
  Filled 2021-09-22: qty 4

## 2021-09-22 MED ORDER — SODIUM BICARBONATE 8.4 % IV SOLN
50.0000 meq | Freq: Once | INTRAVENOUS | Status: AC
  Administered 2021-09-22: 50 meq via INTRAVENOUS
  Filled 2021-09-22: qty 50

## 2021-09-22 MED ORDER — SODIUM BICARBONATE 8.4 % IV SOLN
50.0000 meq | Freq: Once | INTRAVENOUS | Status: AC
  Filled 2021-09-22: qty 50

## 2021-09-22 MED ORDER — ALBUMIN HUMAN 25 % IV SOLN
50.0000 g | Freq: Once | INTRAVENOUS | Status: AC
  Administered 2021-09-22: 50 g via INTRAVENOUS
  Filled 2021-09-22: qty 200

## 2021-09-22 MED ORDER — PHENYLEPHRINE HCL-NACL 20-0.9 MG/250ML-% IV SOLN
INTRAVENOUS | Status: DC | PRN
  Administered 2021-09-22: 40 ug/min via INTRAVENOUS

## 2021-09-22 MED ORDER — DEXTROSE 10 % IV SOLN: INTRAVENOUS | Status: DC

## 2021-09-22 MED ORDER — ETOMIDATE 2 MG/ML IV SOLN: 20.0000 mg | Freq: Once | INTRAVENOUS | Status: AC

## 2021-09-22 MED ORDER — MIDAZOLAM HCL 2 MG/2ML IJ SOLN: 1.0000 mg | INTRAMUSCULAR | Status: DC | PRN

## 2021-09-22 MED ORDER — SUCCINYLCHOLINE CHLORIDE 200 MG/10ML IV SOSY
PREFILLED_SYRINGE | INTRAVENOUS | Status: AC
  Filled 2021-09-22: qty 10

## 2021-09-22 MED ORDER — ROCURONIUM BROMIDE 10 MG/ML (PF) SYRINGE
PREFILLED_SYRINGE | INTRAVENOUS | Status: AC
  Administered 2021-09-22: 40 mg via INTRAVENOUS
  Filled 2021-09-22: qty 10

## 2021-09-22 MED ORDER — LORAZEPAM 2 MG/ML IJ SOLN
0.2500 mg | Freq: Once | INTRAMUSCULAR | Status: AC
  Administered 2021-09-22: 0.25 mg via INTRAVENOUS
  Filled 2021-09-22: qty 1

## 2021-09-22 MED ORDER — ALBUMIN HUMAN 5 % IV SOLN
INTRAVENOUS | Status: AC
  Filled 2021-09-22: qty 250

## 2021-09-22 MED ORDER — SODIUM CHLORIDE 0.9% IV SOLUTION: Freq: Once | INTRAVENOUS | Status: AC

## 2021-09-22 MED ORDER — MIDAZOLAM HCL 2 MG/2ML IJ SOLN
INTRAMUSCULAR | Status: AC
  Administered 2021-09-22: 2 mg via INTRAVENOUS
  Filled 2021-09-22: qty 2

## 2021-09-22 MED ORDER — KETAMINE HCL 50 MG/5ML IJ SOSY
PREFILLED_SYRINGE | INTRAMUSCULAR | Status: AC
  Filled 2021-09-22: qty 5

## 2021-09-22 MED ORDER — FENTANYL CITRATE (PF) 100 MCG/2ML IJ SOLN
INTRAMUSCULAR | Status: AC
  Administered 2021-09-22: 50 ug
  Filled 2021-09-22: qty 2

## 2021-09-22 MED ORDER — PHENYLEPHRINE 80 MCG/ML (10ML) SYRINGE FOR IV PUSH (FOR BLOOD PRESSURE SUPPORT): 200.0000 ug | PREFILLED_SYRINGE | Freq: Once | INTRAVENOUS | Status: AC

## 2021-09-22 MED ORDER — ORAL CARE MOUTH RINSE: 15.0000 mL | OROMUCOSAL | Status: DC | PRN

## 2021-09-22 MED ORDER — VASOPRESSIN 20 UNIT/ML IV SOLN
INTRAVENOUS | Status: AC
  Filled 2021-09-22: qty 1

## 2021-09-22 MED ORDER — SODIUM CHLORIDE 0.9 % IV SOLN
10.0000 mL/h | Freq: Once | INTRAVENOUS | Status: AC
  Administered 2021-09-22: 10 mL/h via INTRAVENOUS

## 2021-09-22 MED ORDER — STERILE WATER FOR INJECTION IV SOLN
INTRAVENOUS | Status: DC
  Filled 2021-09-22 (×3): qty 150

## 2021-09-22 MED ORDER — FENTANYL 2500MCG IN NS 250ML (10MCG/ML) PREMIX INFUSION
25.0000 ug/h | INTRAVENOUS | Status: DC
  Administered 2021-09-22: 25 ug/h via INTRAVENOUS
  Filled 2021-09-22: qty 250

## 2021-09-22 MED ORDER — FLUCONAZOLE IN SODIUM CHLORIDE 200-0.9 MG/100ML-% IV SOLN
200.0000 mg | INTRAVENOUS | Status: DC
  Administered 2021-09-22: 200 mg via INTRAVENOUS
  Filled 2021-09-22 (×2): qty 100

## 2021-09-22 MED ORDER — PROPOFOL 10 MG/ML IV BOLUS
INTRAVENOUS | Status: AC
  Filled 2021-09-22: qty 20

## 2021-09-22 MED ORDER — ENOXAPARIN SODIUM 30 MG/0.3ML IJ SOSY: 30.0000 mg | PREFILLED_SYRINGE | Freq: Every day | INTRAMUSCULAR | Status: DC

## 2021-09-22 MED ORDER — PROPOFOL 1000 MG/100ML IV EMUL
0.0000 ug/kg/min | INTRAVENOUS | Status: DC
  Administered 2021-09-22: 25 ug/kg/min via INTRAVENOUS
  Administered 2021-09-22: 5 ug/kg/min via INTRAVENOUS
  Filled 2021-09-22 (×2): qty 100

## 2021-09-22 MED ORDER — DEXTROSE 50 % IV SOLN: 1.0000 | Freq: Once | INTRAVENOUS | Status: AC

## 2021-09-22 MED ORDER — DOCUSATE SODIUM 50 MG/5ML PO LIQD
100.0000 mg | Freq: Two times a day (BID) | ORAL | Status: DC
  Administered 2021-09-22: 100 mg
  Filled 2021-09-22: qty 10

## 2021-09-22 MED ORDER — DEXTROSE 50 % IV SOLN
INTRAVENOUS | Status: AC
  Administered 2021-09-22: 50 mL
  Filled 2021-09-22: qty 50

## 2021-09-22 MED ORDER — NOREPINEPHRINE 4 MG/250ML-% IV SOLN
INTRAVENOUS | Status: DC | PRN
  Administered 2021-09-22: 20 ug/min via INTRAVENOUS

## 2021-09-22 MED ORDER — NOREPINEPHRINE 4 MG/250ML-% IV SOLN
INTRAVENOUS | Status: AC
  Filled 2021-09-22: qty 250

## 2021-09-22 MED ORDER — MIDAZOLAM HCL 5 MG/5ML IJ SOLN
INTRAMUSCULAR | Status: DC | PRN
  Administered 2021-09-22: 2 mg via INTRAVENOUS

## 2021-09-22 MED ORDER — DEXTROSE 50 % IV SOLN
INTRAVENOUS | Status: AC
  Administered 2021-09-22: 50 mL via INTRAVENOUS
  Filled 2021-09-22: qty 50

## 2021-09-22 MED ORDER — FENTANYL CITRATE (PF) 100 MCG/2ML IJ SOLN
INTRAMUSCULAR | Status: AC
  Filled 2021-09-22: qty 2

## 2021-09-22 MED ORDER — MIDAZOLAM HCL 2 MG/2ML IJ SOLN
INTRAMUSCULAR | Status: AC
  Filled 2021-09-22: qty 2

## 2021-09-22 MED ORDER — SODIUM BICARBONATE 8.4 % IV SOLN
50.0000 meq | Freq: Once | INTRAVENOUS | Status: AC
  Administered 2021-09-22 (×2): 50 meq via INTRAVENOUS

## 2021-09-22 MED ORDER — SODIUM CHLORIDE 0.9 % IR SOLN
Status: DC | PRN
  Administered 2021-09-22: 1000 mL
  Administered 2021-09-22: 2000 mL

## 2021-09-22 MED ORDER — FENTANYL BOLUS VIA INFUSION
25.0000 ug | INTRAVENOUS | Status: DC | PRN
  Administered 2021-09-22: 25 ug via INTRAVENOUS

## 2021-09-22 MED ORDER — VASOPRESSIN 20 UNITS/100 ML INFUSION FOR SHOCK
INTRAVENOUS | Status: AC
  Administered 2021-09-22: 0.03 [IU]/min via INTRAVENOUS
  Filled 2021-09-22: qty 100

## 2021-09-22 MED ORDER — VASOPRESSIN 20 UNIT/ML IV SOLN
INTRAVENOUS | Status: DC | PRN
  Administered 2021-09-22: 4 [IU] via INTRAVENOUS

## 2021-09-22 MED ORDER — SODIUM BICARBONATE 8.4 % IV SOLN
INTRAVENOUS | Status: AC
  Administered 2021-09-22: 50 meq via INTRAVENOUS
  Filled 2021-09-22: qty 150

## 2021-09-22 MED ORDER — ROCURONIUM BROMIDE 10 MG/ML (PF) SYRINGE
PREFILLED_SYRINGE | INTRAVENOUS | Status: AC
  Filled 2021-09-22: qty 10

## 2021-09-22 MED ORDER — VASOPRESSIN 20 UNITS/100 ML INFUSION FOR SHOCK
0.0000 [IU]/min | INTRAVENOUS | Status: DC
  Administered 2021-09-22: 0.04 [IU]/min via INTRAVENOUS
  Filled 2021-09-22: qty 100

## 2021-09-22 MED ORDER — FENTANYL CITRATE PF 50 MCG/ML IJ SOSY
25.0000 ug | PREFILLED_SYRINGE | Freq: Once | INTRAMUSCULAR | Status: AC
  Administered 2021-09-22: 25 ug via INTRAVENOUS

## 2021-09-22 MED ORDER — PHENYLEPHRINE 80 MCG/ML (10ML) SYRINGE FOR IV PUSH (FOR BLOOD PRESSURE SUPPORT)
PREFILLED_SYRINGE | INTRAVENOUS | Status: AC
  Filled 2021-09-22: qty 10

## 2021-09-22 SURGICAL SUPPLY — 46 items
APL SWBSTK 6 STRL LF DISP (MISCELLANEOUS) ×1
APPLICATOR COTTON TIP 6 STRL (MISCELLANEOUS) ×1 IMPLANT
APPLICATOR COTTON TIP 6IN STRL (MISCELLANEOUS) ×2 IMPLANT
BAG COUNTER SPONGE SURGICOUNT (BAG) ×1 IMPLANT
BAG SPNG CNTER NS LX DISP (BAG) ×1
BLADE EXTENDED COATED 6.5IN (ELECTRODE) IMPLANT
BLADE HEX COATED 2.75 (ELECTRODE) ×2 IMPLANT
COVER MAYO STAND STRL (DRAPES) ×1 IMPLANT
DRAPE LAPAROSCOPIC ABDOMINAL (DRAPES) ×2 IMPLANT
DRAPE WARM FLUID 44X44 (DRAPES) ×1 IMPLANT
ELECT REM PT RETURN 15FT ADLT (MISCELLANEOUS) ×2 IMPLANT
GAUZE SPONGE 4X4 12PLY STRL (GAUZE/BANDAGES/DRESSINGS) ×2 IMPLANT
GLOVE BIO SURGEON STRL SZ7.5 (GLOVE) ×4 IMPLANT
GLOVE BIOGEL PI IND STRL 7.0 (GLOVE) ×1 IMPLANT
GLOVE BIOGEL PI INDICATOR 7.0 (GLOVE) ×1
GOWN STRL REUS W/ TWL LRG LVL3 (GOWN DISPOSABLE) ×1 IMPLANT
GOWN STRL REUS W/ TWL XL LVL3 (GOWN DISPOSABLE) ×1 IMPLANT
GOWN STRL REUS W/TWL LRG LVL3 (GOWN DISPOSABLE) ×2
GOWN STRL REUS W/TWL XL LVL3 (GOWN DISPOSABLE) ×2
HANDLE SUCTION POOLE (INSTRUMENTS) IMPLANT
KIT BASIN OR (CUSTOM PROCEDURE TRAY) ×2 IMPLANT
KIT TURNOVER KIT A (KITS) ×1 IMPLANT
NS IRRIG 1000ML POUR BTL (IV SOLUTION) ×2 IMPLANT
PACK GENERAL/GYN (CUSTOM PROCEDURE TRAY) ×2 IMPLANT
RELOAD PROXIMATE 75MM BLUE (ENDOMECHANICALS) ×4 IMPLANT
RELOAD STAPLE 75 3.8 BLU REG (ENDOMECHANICALS) IMPLANT
SPONGE ABD ABTHERA ADVANCE (MISCELLANEOUS) ×1 IMPLANT
SPONGE T-LAP 18X18 ~~LOC~~+RFID (SPONGE) ×2 IMPLANT
STAPLER PROXIMATE 75MM BLUE (STAPLE) ×1 IMPLANT
STAPLER VISISTAT 35W (STAPLE) ×2 IMPLANT
SUCTION POOLE HANDLE (INSTRUMENTS) ×2
SUT PROLENE 2 0 SH DA (SUTURE) ×1 IMPLANT
SUT SILK 2 0 (SUTURE) ×2
SUT SILK 2 0 SH CR/8 (SUTURE) IMPLANT
SUT SILK 2-0 18XBRD TIE 12 (SUTURE) IMPLANT
SUT SILK 3 0 (SUTURE) ×2
SUT SILK 3 0 SH CR/8 (SUTURE) IMPLANT
SUT SILK 3-0 18XBRD TIE 12 (SUTURE) IMPLANT
SUT VIC AB 3-0 SH 18 (SUTURE) ×1 IMPLANT
TOWEL OR 17X26 10 PK STRL BLUE (TOWEL DISPOSABLE) ×4 IMPLANT
TRAY FOL W/BAG SLVR 16FR STRL (SET/KITS/TRAYS/PACK) IMPLANT
TRAY FOLEY MTR SLVR 16FR STAT (SET/KITS/TRAYS/PACK) IMPLANT
TRAY FOLEY W/BAG SLVR 16FR LF (SET/KITS/TRAYS/PACK) ×2
WATER STERILE IRR 1000ML POUR (IV SOLUTION) ×2 IMPLANT
YANKAUER SUCT BULB TIP 10FT TU (MISCELLANEOUS) ×1 IMPLANT
YANKAUER SUCT BULB TIP NO VENT (SUCTIONS) IMPLANT

## 2021-09-22 NOTE — Anesthesia Preprocedure Evaluation (Addendum)
Anesthesia Evaluation  Patient identified by MRN, date of birth, ID band Patient unresponsive    Reviewed: Allergy & Precautions, Patient's Chart, lab work & pertinent test results  History of Anesthesia Complications Negative for: history of anesthetic complications  Airway Mallampati: Intubated       Dental   Pulmonary asthma , sleep apnea ,       + intubated    Cardiovascular negative cardio ROS   Rhythm:Regular Rate:Tachycardia     Neuro/Psych negative neurological ROS     GI/Hepatic Neg liver ROS, GERD  ,Bowel perforation with free air, severe lactic acidosis and sepsis   Endo/Other  negative endocrine ROS  Renal/GU ARFRenal disease  negative genitourinary   Musculoskeletal  (+) Arthritis , Rheumatoid disorders,    Abdominal   Peds  Hematology negative hematology ROS (+)   Anesthesia Other Findings Breast cancer on chemo  Reproductive/Obstetrics                           Anesthesia Physical Anesthesia Plan  ASA: 5 and emergent  Anesthesia Plan: General   Post-op Pain Management:    Induction:   PONV Risk Score and Plan: Treatment may vary due to age or medical condition  Airway Management Planned: Oral ETT  Additional Equipment: Arterial line  Intra-op Plan:   Post-operative Plan: Post-operative intubation/ventilation  Informed Consent:   Patient has DNR.  Continue DNR.     Plan Discussed with:   Anesthesia Plan Comments: (Surgeon discussed DNR with family- they do not want CPR in case of cardiac arrest, but all other resuscitation measures OK)       Anesthesia Quick Evaluation

## 2021-09-22 NOTE — Progress Notes (Signed)
[place] Progress Note Patient Name: Christie Thomas DOB: February 26, 1956 MRN: [id number]   Date of Service  10/06/2021  HPI/Events of Note  Review of portable CXR reveals: Endotracheal tube tip at the level of the clavicular heads (about 4 cm above the carina). Left chest wall Port-A-Cath tip at the cavoatrial junction. Unchanged bibasilar opacities. Small pleural effusions. Feeding tube side port projects the stomach.  eICU Interventions  Continue present management.      Intervention Category Major Interventions: Other:  Stehanie Ekstrom Cornelia Copa 10/10/2021, 10:40 PM

## 2021-09-22 NOTE — Progress Notes (Signed)
Unfortunately, Christie Thomas is having problems right now.  She really has had change in her mental status.  She had a lot of confusion last night.  She apparently pulled out the Port-A-Cath access.  She took off a cardiac monitor.  I am unsure as to why she would have had this change in mental status.  Her lactic acid was quite high.  Her white cell count came up very quickly.  Yesterday it was 9.  Today was 34,000.  This might indicate an infection.  This also could be secondary to the Neupogen that she had for the neutropenia.  She had a blood gas done which showed a pH of 7.22.  PO2 of 78.  Again, sepsis is certainly a concern.  She has been on antibiotics.  She is going to have cultures taken.  A chest x-ray was done which showed low lung volumes.  There may be some infiltrates.  There is no obvious free air in the abdomen.  She is to have a CT of the abdomen pelvis this morning.  Her labs are quite abnormal.  Her BUN is 61 creatinine 1.91.  Her sodium is 149.  Chloride 116.  Her magnesium is 3.1.  Her AST she is 123 and ALT 72.  Her white cell count 34.1.  Hemoglobin 14.  Platelet count 165,000.  Again, you had worry about sepsis.  There is also could be a possibility of pulmonary embolism.  She was on prophylactic Lovenox.  Again, I am not sure as to what the is the etiology for this change.  Her vital signs are temperature of 97.7.  Blood pressure 111/73.  Pulse of 119.  Her lungs sound pretty clear bilaterally.  She has good air movement bilaterally.  I do not hear any wheezing.  Cardiac exam is tachycardic but regular.  Abdomen is soft.  There may be a little bit of distention.  Bowel sounds are decreased.  I cannot elicit any obvious discomfort.  Again, Christie Thomas has taken a turn.  I would have to worry about sepsis.  Given all the GI problems that she has had, I have to worry about some type of enteric organism that could have gone into her bloodstream.  Again she is  going down to the ICU.  I know she will get incredible care down in the ICU.  I know that all the staff upon [address] have done a great job trying to help.  Lattie Haw, MD  Psalms 27:14

## 2021-09-22 NOTE — IPAL (Addendum)
  Interdisciplinary Goals of Care Family Meeting   Date carried out: 10/11/2021  Location of Christie meeting: Conference room  Member's involved: Nurse Practitioner and Family Member or next of kin  Durable Power of Attorney or acting medical decision maker: Husband, Christie Thomas   Discussion: We discussed goals of care for Christie Thomas .  Discussed new bowel perf and rapidly decompensating clinical condition.  Christie Thomas is clear that he would like to pursue surgical intervention if offered. We discussed code status. I explained this as our game plan in Christie event that a patient's heart stops -- I was very clear that this does not impact our scope of medical/surgical intervention, but is to guide Christie medical team should a patient arrest despite all other efforts.   I shared concern that measures such as CPR/DF in event of cardiac arrest would be unlikely to provide benefit. We discussed Christie strong likelihood that following intubation (which is unavoidable in this instance given that pt will be posted emergently for OR) it is very likely that [name] will actually decompensate further despite efforts to prevent this.   A decision was reached to update code status to partial code--  No CPR, No DF in event of cardiac arrest.  Intubation, ACLS meds ok.   Pursuit of full scope of offered medical/surgical interventions    Code status: Limited -- No CPR, No DF.   Disposition: Continue current acute care  Time spent for Christie meeting: 30 min    Eliseo Gum MSN, AGACNP-BC [place] for pager 09/21/2021, 8:06 AM

## 2021-09-22 NOTE — Progress Notes (Signed)
Dr. Nevada Crane, Olena Heckle NP and RT at her bedside now.

## 2021-09-22 NOTE — Anesthesia Postprocedure Evaluation (Signed)
Anesthesia Post Note  Patient: Christie Thomas  Procedure(s) Performed: EXPLORATORY LAPAROTOMY     Patient location during evaluation: SICU Anesthesia Type: General Level of consciousness: sedated Pain management: pain level controlled Vital Signs Assessment: post-procedure vital signs reviewed and stable Respiratory status: patient remains intubated per anesthesia plan Cardiovascular status: stable Postop Assessment: no apparent nausea or vomiting Anesthetic complications: no   No notable events documented.  Last Vitals:  Vitals:   10/14/2021 1101   BP: 116/61   Pulse: 110   Resp: 34   Temp:    SpO2: 96%                   Lidia Collum

## 2021-09-22 NOTE — Consult Note (Signed)
Christie Thomas 12/05/1955  [id number].    Requesting MD: Sherral Hammers, MD Chief Complaint/Reason for Consult: pneumoperitoneum  HPI:  66 y/o F w/ PMH breast CA on chemotherapy who we are consulted on emergently for pneumoperitoneum and feculent peritonitis on CT scan. She is currently in septic shock and was emergently intubated by CCM.   ROS: Review of Systems  Unable to perform ROS: Acuity of condition    Family History  Problem Relation Age of Onset   Stroke Mother    Hypertension Mother    Hyperlipidemia Mother    Diabetes Mother    Cancer Mother    Sudden death Paternal Grandfather 51   Alzheimer's disease Father    Heart attack Neg Hx     Past Medical History:  Diagnosis Date   Anxiety 01/20/2016   Asthma    Cancer ([place]) 02/09/2021   right breast   Diverticulitis 01/20/2016   Diverticulosis 01/20/2016   Epilepsy ([place]) 01/20/2016   Adolescent    Fatty liver 01/20/2016   GERD (gastroesophageal reflux disease)    Hay fever    Hyperlipidemia    IBS (irritable bowel syndrome) 01/20/2016   Obesity 01/20/2016   OSA (obstructive sleep apnea) 09/07/2017   Osteoarthritis of both hands 01/20/2016   PONV (postoperative nausea and vomiting)    Swelling of ankle joint, left 01/20/2016   Trochanteric bursitis 01/20/2016    Past Surgical History:  Procedure Laterality Date   AUGMENTATION MAMMAPLASTY     BACK SURGERY     x2   BREAST BIOPSY     fatty tumor   BREAST ENHANCEMENT SURGERY     BREAST LUMPECTOMY WITH RADIOACTIVE SEED AND SENTINEL LYMPH NODE BIOPSY Right 05/17/2021   Procedure: RIGHT BREAST LUMPECTOMY WITH RADIOACTIVE SEED;  Surgeon: Jovita Kussmaul, MD;  Location: [facility];  Service: General;  Laterality: Right;   BREAST RECONSTRUCTION Bilateral 05/17/2021   Procedure: BREAST RECONSTRUCTION REMOVAL IMPLANTS AND REPLACEMENT OF IMPLANTS;  Surgeon: Wallace Going, DO;  Location: [facility];  Service: Plastics;  Laterality: Bilateral;   BREAST REDUCTION  SURGERY Bilateral 05/17/2021   Procedure: ONCOPLASTIC BREAST REDUCTION;  Surgeon: Wallace Going, DO;  Location: [facility];  Service: Plastics;  Laterality: Bilateral;   CARDIAC CATHETERIZATION  07/2010   CARPAL TUNNEL RELEASE     CATARACT EXTRACTION Bilateral    2023   CESAREAN SECTION     CHOLECYSTECTOMY     HAND SURGERY     carpal tunnel release, cyst excision   PORTACATH PLACEMENT N/A 07/26/2021   Procedure: INSERTION PORT-A-CATH;  Surgeon: Jovita Kussmaul, MD;  Location: [facility];  Service: General;  Laterality: N/A;   SENTINEL NODE BIOPSY Right 05/17/2021   Procedure: RIGHT SENTINEL LYMPH NODE BIOPSY WITH MAGTRACE INJECTION;  Surgeon: Jovita Kussmaul, MD;  Location: [facility];  Service: General;  Laterality: Right;   SHOULDER SURGERY     right   TONSILLECTOMY AND ADENOIDECTOMY      Social History:  reports that she has never smoked. She has never used smokeless tobacco. She reports that she does not currently use alcohol. She reports that she does not use drugs.  Allergies:  Allergies  Allergen Reactions   Shellfish Allergy Anaphylaxis   Cefepime Swelling    Lips swelling and numbness; chest tightness   Cephalexin     Other reaction(s): Unknown   Ciprofloxacin     Other reaction(s): Unknown   Codeine Other (See Comments)    headache   Metoclopramide Other (  See Comments)   Latex Rash    Skin appears to be burned    Medications Prior to Admission  Medication Sig Dispense Refill   acetaminophen (TYLENOL) 500 MG tablet Take 500 mg by mouth every 6 (six) hours as needed for moderate pain.     antiseptic oral rinse (BIOTENE) LIQD 15 mLs by Mouth Rinse route 3 (three) times daily as needed for dry mouth.     clonazePAM (KLONOPIN) 0.5 MG tablet Take 0.5 tablets (0.25 mg total) by mouth at bedtime.     dexamethasone (DECADRON) 4 MG tablet Take 2 tablets (8 mg total) by mouth 2 (two) times daily. Start the day before Taxotere. Then again the day after chemo for 3 days. 30 tablet 1    famotidine (PEPCID) 40 MG tablet Take 1 tablet (40 mg total) by mouth 2 (two) times daily. 30 tablet 3   fenofibrate 160 MG tablet TAKE 1 TABLET ONCE DAILY WITH FOOD. (Patient taking differently: Take 160 mg by mouth daily.) 90 tablet 0   fluconazole (DIFLUCAN) 200 MG tablet Take 1 tablet (200 mg total) by mouth daily. (Patient taking differently: Take 200 mg by mouth daily as needed (yeast).) 30 tablet 2   fluticasone (FLONASE) 50 MCG/ACT nasal spray Place 1-2 sprays into both nostrils daily as needed for allergies.     lactulose (CHRONULAC) 10 GM/15ML solution Take 30 mLs (20 g total) by mouth 2 (two) times daily as needed for mild constipation. 236 mL 3   lidocaine-prilocaine (EMLA) cream Apply 1 application. topically daily as needed. Apply to affected area once     naproxen sodium (ALEVE) 220 MG tablet Take 220 mg by mouth daily as needed (pain).     ondansetron (ZOFRAN) 8 MG tablet Take 1 tablet (8 mg total) by mouth 2 (two) times daily as needed for refractory nausea / vomiting. Start on day 3 after chemo. 30 tablet 1   OVER THE COUNTER MEDICATION daily. Ultra Flora IB     OVER THE COUNTER MEDICATION Take 1 capsule by mouth daily. EMMA for Gut health     prochlorperazine (COMPAZINE) 10 MG tablet Take 1 tablet (10 mg total) by mouth every 6 (six) hours as needed (Nausea or vomiting). (Patient taking differently: Take 10 mg by mouth every 6 (six) hours as needed for vomiting or nausea (Nausea or vomiting).) 30 tablet 1   Propylene Glycol (SYSTANE COMPLETE OP) Place 1 drop into both eyes daily as needed (dry eyes).     rosuvastatin (CRESTOR) 10 MG tablet Take 1 tablet (10 mg total) by mouth at bedtime.     traMADol (ULTRAM) 50 MG tablet Take 50 mg by mouth every 6 (six) hours as needed for severe pain.     magic mouthwash SOLN Take 10 mLs by mouth 4 (four) times daily as needed for mouth pain. (Patient not taking: Reported on 09/05/2021) 240 mL 0   OLANZapine (ZYPREXA) 10 MG tablet Take 1 tablet  (10 mg total) by mouth at bedtime. (Patient not taking: Reported on 09/03/2021) 30 tablet 1     Physical Exam: Blood pressure 111/73, pulse (!) 119, temperature 97.7 F (36.5 C), temperature source Axillary, resp. rate (!) 40, height '5\' 4"'$  (1.626 m), weight 81.6 kg, SpO2 92 %. General: critically ill appearing female intubated and sedated, tachycardic with a systolic pressure in the 26'Z on the monitor.  HEENT: head -normocephalic, atraumatic; Eyes: pupils equal, anicteric sclerare. Neck- Trachea is midline, no thyromegaly  CV- sinus tachycardia, normal S1/S2, no  M/R/G, no lower extremity edema Pulm- port-a-cath left chest wall, ventilated respirations.. Diminished breath sounds BL bases. Abd- distended, unable to assess pain given sepsis and recent sedation MSK- UE/LE symmetrical, no cyanosis, clubbing, or edema. Neuro- nonfocal Psych- unable to assess Skin: warm and dry\  Results for orders placed or performed during the hospital encounter of 09/10/2021 (from the past 48 hour(s))  CBC with Differential/Platelet     Status: Abnormal   Collection Time: 09/21/21  7:00 AM  Result Value Ref Range   WBC 9.5 4.0 - 10.5 K/uL   RBC 3.84 (L) 3.87 - 5.11 MIL/uL   Hemoglobin 11.6 (L) 12.0 - 15.0 g/dL   HCT 36.5 36.0 - 46.0 %   MCV 95.1 80.0 - 100.0 fL   MCH 30.2 26.0 - 34.0 pg   MCHC 31.8 30.0 - 36.0 g/dL   RDW 15.9 (H) 11.5 - 15.5 %   Platelets 164 150 - 400 K/uL   nRBC 0.5 (H) 0.0 - 0.2 %   Neutrophils Relative % 76 %   Neutro Abs 7.2 1.7 - 7.7 K/uL   Lymphocytes Relative 5 %   Lymphs Abs 0.5 (L) 0.7 - 4.0 K/uL   Monocytes Relative 12 %   Monocytes Absolute 1.2 (H) 0.1 - 1.0 K/uL   Eosinophils Relative 0 %   Eosinophils Absolute 0.0 0.0 - 0.5 K/uL   Basophils Relative 0 %   Basophils Absolute 0.0 0.0 - 0.1 K/uL   WBC Morphology MILD LEFT SHIFT (1-5% METAS, OCC MYELO, OCC BANDS)     Comment: TOXIC GRANULATION   Immature Granulocytes 7 %   Abs Immature Granulocytes 0.66 (H) 0.00 -  0.07 K/uL    Comment: Performed at [facility], [place] [address] [place], [place] [address]  Comprehensive metabolic panel     Status: Abnormal   Collection Time: 09/21/21  7:00 AM  Result Value Ref Range   Sodium 145 135 - 145 mmol/L   Potassium 3.5 3.5 - 5.1 mmol/L   Chloride 113 (H) 98 - 111 mmol/L   CO2 24 22 - 32 mmol/L   Glucose, Bld 116 (H) 70 - 99 mg/dL    Comment: Glucose reference range applies only to samples taken after fasting for at least 8 hours.   BUN 32 (H) 8 - 23 mg/dL   Creatinine, Ser 0.54 0.44 - 1.00 mg/dL   Calcium 8.6 (L) 8.9 - 10.3 mg/dL   Total Protein 5.7 (L) 6.5 - 8.1 g/dL   Albumin 2.5 (L) 3.5 - 5.0 g/dL   AST 25 15 - 41 U/L   ALT 20 0 - 44 U/L   Alkaline Phosphatase 61 38 - 126 U/L   Total Bilirubin 1.3 (H) 0.3 - 1.2 mg/dL   GFR, Estimated >60 >60 mL/min    Comment: (NOTE) Calculated using the CKD-EPI Creatinine Equation (2021)    Anion gap 8 5 - 15    Comment: Performed at [facility], [place] [address] [place], [place] [address]  Magnesium     Status: None   Collection Time: 09/21/21  7:00 AM  Result Value Ref Range   Magnesium 2.2 1.7 - 2.4 mg/dL    Comment: Performed at [facility], [place] [address], [place],  [address]  CBC with Differential/Platelet     Status: Abnormal   Collection Time: 09/21/21  1:36 PM  Result Value Ref Range   WBC 14.5 (H) 4.0 - 10.5 K/uL   RBC 4.42 3.87 - 5.11  MIL/uL   Hemoglobin 13.3 12.0 - 15.0 g/dL   HCT 41.8 36.0 - 46.0 %   MCV 94.6 80.0 - 100.0 fL   MCH 30.1 26.0 - 34.0 pg   MCHC 31.8 30.0 - 36.0 g/dL   RDW 16.2 (H) 11.5 - 15.5 %   Platelets 222 150 - 400 K/uL   nRBC 2.6 (H) 0.0 - 0.2 %   Neutrophils Relative % 57 %   Neutro Abs 8.8 (H) 1.7 - 7.7 K/uL   Band Neutrophils 4 %   Lymphocytes Relative 14 %   Lymphs Abs 2.0 0.7 - 4.0 K/uL   Monocytes Relative 12 %   Monocytes Absolute 1.7 (H) 0.1 - 1.0 K/uL   Eosinophils Relative 0 %   Eosinophils  Absolute 0.0 0.0 - 0.5 K/uL   Basophils Relative 0 %   Basophils Absolute 0.0 0.0 - 0.1 K/uL   WBC Morphology      MODERATE LEFT SHIFT (>5% METAS AND MYELOS,OCC PRO NOTED)    Comment: TOXIC GRANULATION   Metamyelocytes Relative 3 %   Myelocytes 8 %   Promyelocytes Relative 2 %   Abs Immature Granulocytes 1.90 (H) 0.00 - 0.07 K/uL    Comment: Performed at [facility], [place] [address], [place], [place] [address]  Glucose, capillary     Status: Abnormal   Collection Time: 09/21/21 11:15 PM  Result Value Ref Range   Glucose-Capillary 124 (H) 70 - 99 mg/dL    Comment: Glucose reference range applies only to samples taken after fasting for at least 8 hours.  Blood gas, arterial     Status: Abnormal   Collection Time: 10/11/2021  2:51 AM  Result Value Ref Range   pH, Arterial 7.29 (L) 7.35 - 7.45   pCO2 arterial 23 (L) 32 - 48 mmHg   pO2, Arterial 64 (L) 83 - 108 mmHg   Bicarbonate 11.3 (L) 20.0 - 28.0 mmol/L   Acid-base deficit 13.4 (H) 0.0 - 2.0 mmol/L   O2 Saturation 92.2 %   Patient temperature 36.4    Allens test (pass/fail) PASS PASS    Comment: Performed at [facility], [place] [address] [place], [place] [address]  CBC with Differential/Platelet     Status: Abnormal   Collection Time: 09/24/2021  2:53 AM  Result Value Ref Range   WBC 34.1 (H) 4.0 - 10.5 K/uL   RBC 4.64 3.87 - 5.11 MIL/uL   Hemoglobin 14.0 12.0 - 15.0 g/dL   HCT 44.9 36.0 - 46.0 %   MCV 96.8 80.0 - 100.0 fL   MCH 30.2 26.0 - 34.0 pg   MCHC 31.2 30.0 - 36.0 g/dL   RDW 16.7 (H) 11.5 - 15.5 %   Platelets 165 150 - 400 K/uL   nRBC 4.1 (H) 0.0 - 0.2 %   Neutrophils Relative % 75 %   Neutro Abs 25.9 (H) 1.7 - 7.7 K/uL   Lymphocytes Relative 4 %   Lymphs Abs 1.4 0.7 - 4.0 K/uL   Monocytes Relative 4 %   Monocytes Absolute 1.2 (H) 0.1 - 1.0 K/uL   Eosinophils Relative 0 %   Eosinophils Absolute 0.0 0.0 - 0.5 K/uL   Basophils Relative 0 %   Basophils Absolute 0.1 0.0 - 0.1 K/uL    WBC Morphology      MODERATE LEFT SHIFT (>5% METAS AND MYELOS,OCC PRO NOTED)    Comment: TOXIC GRANULATION   Immature Granulocytes 17 %   Abs Immature Granulocytes 5.62 (H) 0.00 -  0.07 K/uL   Abnormal Lymphocytes Present PRESENT     Comment: Performed at [facility], [place] [address], [place], [place] [address]  Comprehensive metabolic panel     Status: Abnormal   Collection Time: 10/07/2021  2:53 AM  Result Value Ref Range   Sodium 149 (H) 135 - 145 mmol/L   Potassium 4.1 3.5 - 5.1 mmol/L   Chloride 116 (H) 98 - 111 mmol/L   CO2 15 (L) 22 - 32 mmol/L   Glucose, Bld 103 (H) 70 - 99 mg/dL    Comment: Glucose reference range applies only to samples taken after fasting for at least 8 hours.   BUN 61 (H) 8 - 23 mg/dL   Creatinine, Ser 1.91 (H) 0.44 - 1.00 mg/dL    Comment: DELTA CHECK NOTED   Calcium 8.9 8.9 - 10.3 mg/dL   Total Protein 5.1 (L) 6.5 - 8.1 g/dL   Albumin 2.3 (L) 3.5 - 5.0 g/dL   AST 123 (H) 15 - 41 U/L   ALT 72 (H) 0 - 44 U/L   Alkaline Phosphatase 73 38 - 126 U/L   Total Bilirubin 1.2 0.3 - 1.2 mg/dL   GFR, Estimated 29 (L) >60 mL/min    Comment: (NOTE) Calculated using the CKD-EPI Creatinine Equation (2021)    Anion gap 18 (H) 5 - 15    Comment: Performed at [facility], [place] [address] [place], [place] [address]  Magnesium     Status: Abnormal   Collection Time: 09/21/2021  2:53 AM  Result Value Ref Range   Magnesium 3.1 (H) 1.7 - 2.4 mg/dL    Comment: Performed at [facility], [place] [address] [place], [place] [address]  Brain natriuretic peptide     Status: Abnormal   Collection Time: 10/11/2021  2:53 AM  Result Value Ref Range   B Natriuretic Peptide 200.3 (H) 0.0 - 100.0 pg/mL    Comment: Performed at [facility], [place] [address], [place], [place] [address]  Lactic acid, plasma     Status: Abnormal   Collection Time: 10/18/2021  4:10 AM  Result Value Ref Range   Lactic Acid, Venous  >9.0 (HH) 0.5 - 1.9 mmol/L    Comment: CRITICAL RESULT CALLED TO, READ BACK BY AND VERIFIED WITH: CALLED TO KYEI,K AT 0505 ON 09/20/2021 BY VAZQUEZ,J Performed at [facility], [place] [address] [place], [place] [address]   Blood gas, arterial     Status: Abnormal   Collection Time: 09/29/2021  5:50 AM  Result Value Ref Range   pH, Arterial 7.22 (L) 7.35 - 7.45   pCO2 arterial 20 (L) 32 - 48 mmHg   pO2, Arterial 78 (L) 83 - 108 mmHg   Bicarbonate 8.0 (L) 20.0 - 28.0 mmol/L   Acid-base deficit 17.7 (H) 0.0 - 2.0 mmol/L   O2 Saturation 94 %   Patient temperature 36.5    Allens test (pass/fail) PASS PASS    Comment: Performed at [facility], [place] [address] [place],  [address]   CT ABDOMEN PELVIS WO CONTRAST  Addendum Date: 10/12/2021   ADDENDUM REPORT: 10/16/2021 07:11 ADDENDUM: Critical Value/emergent results were called by telephone at the time of interpretation on 10/10/2021 at 0701 hours to Dr. Ander Slade, Who verbally acknowledged these results. Electronically Signed   By: Genevie Ann M.D.   On: 10/02/2021 07:11   Result Date: 10/02/2021 CLINICAL DATA:  66 year old female with acute abdominal pain. History of breast  cancer, chemotherapy. EXAM: CT ABDOMEN AND PELVIS WITHOUT CONTRAST TECHNIQUE: Multidetector CT imaging of the abdomen and pelvis was performed following the standard protocol without IV contrast. RADIATION DOSE REDUCTION: This exam was performed according to the departmental dose-optimization program which includes automated exposure control, adjustment of the mA and/or kV according to patient size and/or use of iterative reconstruction technique. COMPARISON:  Noncontrast CT Abdomen and Pelvis 09/09/2021 and earlier. FINDINGS: Lower chest: Respiratory motion. No pericardial effusion. Small new layering pleural effusions and developing bilateral lower lobe consolidation. Hepatobiliary: Absent gallbladder as before. Free air and free fluid surrounding the  liver. Negative noncontrast liver parenchyma. Pancreas: Some pneumoperitoneum in the lesser sac. Negative noncontrast pancreas. Spleen: Adjacent free air and free fluid but otherwise negative. Adrenals/Urinary Tract: Exophytic simple fluid density and probably benign right upper pole renal cyst redemonstrated. Nonobstructed kidneys with no nephrolithiasis. Adrenal glands remain normal. Ureters are decompressed. Diminutive urinary bladder. Stomach/Bowel: Moderate to large volume pneumoperitoneum. Low-density free fluid in the abdomen and pelvis., And there is a relatively large 12.5 cm collection of extraluminal gas and stool suspected in the abdomen deep to the umbilicus on series 2, image 67. Increased sigmoid colon distention with stool, wall thickening, and inflammation with underlying diverticular disease. The upstream descending colon is decompressed and appears mildly inflamed. The transverse colon is decompressed and indistinct. The right colon contains fluid and is more decompressed. The appendix now also appears enlarged and indistinct on series 2, image 71. Stomach and duodenum are fluid-filled but relatively decompressed. Fluid-filled but nondilated small bowel loops throughout the abdomen. Vascular/Lymphatic: Mild Aortoiliac calcified atherosclerosis. Normal caliber abdominal aorta. Vascular patency is not evaluated in the absence of IV contrast. No lymphadenopathy identified. Reproductive: Negative noncontrast appearance; small calcified fibroid. Other: Moderate low-density pelvic free fluid. Musculoskeletal: Stable visualized osseous structures., including mild T12 superior endplate deformity. IMPRESSION: 1. Perforated Bowel. Moderate to large volume of Free Air and small to moderate volume of free fluid in the abdomen, plus a large 12.5 cm suspected Extraluminal Collection Of Bowel Contents in the mesentery deep to the umbilicus. Progressive inflammation of all large and small bowel loops from the  recent CT 09/14/2021. The sigmoid colon is most distended (with stool), but the appendix also appears abnormally dilated and indistinct now. Recommend surgery consultation. 2. New small layering pleural effusions and developing bilateral lower lobe consolidation, suspicious for Pneumonia. Electronically Signed: By: Genevie Ann M.D. On: 09/27/2021 06:56   DG CHEST PORT 1 VIEW  Result Date: 09/24/2021 CLINICAL DATA:  Increasing shortness of breath, hypoxia, abdominal distention. EXAM: PORTABLE CHEST 1 VIEW, ABDOMEN PORTABLE ONE VIEW COMPARISON:  08/01/2021, 09/17/2021. FINDINGS: The heart size and mediastinal contours are within normal limits. Lung volumes are extremely low with patchy airspace disease at the lung bases. No effusion or pneumothorax. A stable left chest port is noted. Surgical clips are present in the right chest wall. No acute osseous abnormality. Nonobstructive bowel-gas pattern. Surgical clips are noted in the right upper quadrant. No unusual calcification. IMPRESSION: 1. Low lung volumes with patchy atelectasis or infiltrate at the lung bases. 2. Nonobstructive bowel-gas pattern. Electronically Signed   By: Brett Fairy M.D.   On: 10/18/2021 04:19   DG Abd Portable 1V  Result Date: 10/11/2021 CLINICAL DATA:  Increasing shortness of breath, hypoxia, abdominal distention. EXAM: PORTABLE CHEST 1 VIEW, ABDOMEN PORTABLE ONE VIEW COMPARISON:  08/01/2021, 09/17/2021. FINDINGS: The heart size and mediastinal contours are within normal limits. Lung volumes are extremely low with patchy airspace disease at  the lung bases. No effusion or pneumothorax. A stable left chest port is noted. Surgical clips are present in the right chest wall. No acute osseous abnormality. Nonobstructive bowel-gas pattern. Surgical clips are noted in the right upper quadrant. No unusual calcification. IMPRESSION: 1. Low lung volumes with patchy atelectasis or infiltrate at the lung bases. 2. Nonobstructive bowel-gas pattern.  Electronically Signed   By: Brett Fairy M.D.   On: 09/28/2021 04:19      Assessment/Plan 65y/o F with PMH breast cancer on chemotherapy who is now in septic shock due to intestinal perforation. She just got an emergent access but remains hemodynamically unstable and she is extremely acidotic. CCM working on line placement, pressors, etc. Dr. Marlou Starks discussed surgery with husband, Jeneen Rinks, who would like [place] to take her for exploratory laparotomy. If her heart were to arrest during surgery he wound not want [place] to perform chest compressions.    I reviewed nursing notes, hospitalist notes, last 24 h vitals and pain scores, last 48 h intake and output, last 24 h labs and trends, and last 24 h imaging results.  Jill Alexanders, [facility] Surgery 09/27/2021, 7:32 AM Please see Amion for pager number during day hours 7:00am-4:30pm or 7:00am -11:30am on weekends

## 2021-09-22 NOTE — Progress Notes (Signed)
Patient ID: Christie Thomas, female   DOB: 1955/10/25, 66 y.o.   MRN: [id number] ACS RISK CALCULATOR USE:  Risk Calculator was used for discussion of surgery: Yes

## 2021-09-22 NOTE — Progress Notes (Addendum)
[facility] Progress Note Patient Name: ELIZE PINON DOB: 05/13/1955 MRN: [id number]   Date of Service  10/16/2021  HPI/Events of Note  Multiple issues: 1. ABG on 60%/PRVC 30/TV 500/P 5 = 7.15/25/65/8.6. 2. Blood glucose < 10. D50 1 amp IV already given. Blood glucose now = 91. 3. Now on 80% FiO2 with sat = 90.   eICU Interventions  Plan: NaHCO3 100 meq IV now. (Additional dose) Increase NaHCO3 IV infusion to 150 mL/hour. D10W IV infusion at 50 mL/hour.  Increase PEEP to 10. Portable CXR STAT.  Repeat ABG at 12 midnight.      Intervention Category Major Interventions: Acid-Base disturbance - evaluation and management;Respiratory failure - evaluation and management  Shawan Corella Eugene 10/01/2021, 9:15 PM

## 2021-09-22 NOTE — Progress Notes (Signed)
[facility] Progress Note Patient Name: Christie Thomas DOB: 04/19/1955 MRN: [id number]   Date of Service  09/26/2021  HPI/Events of Note  65/F with breast cancer on chemotherapy admitted for sepsis.  She was deteriorating through the night.  Lactate >9, concerning for ischemic colitis. Pt with increasing O2 requirements.  eICU Interventions  Pt will be intubated now.   Surgery to evaluate this morning.      Intervention Category Evaluation Type: New Patient Evaluation  Elsie Lincoln 09/20/2021, 6:53 AM

## 2021-09-22 NOTE — Progress Notes (Addendum)
  Addendum:  Labs below have started to result and are consistent with progressive multisystem organ failure   Renal fxn worse with Cr 2.28. BUN 62  Transaminitis worse -- Alk phos 134 AST 3570 ALT 1547 Slight hyperbilirubinemia New coagulopathy -- INR 3.9  Serum bicarb is 14 despite incr bicarb gtt + bicarb pushes LA still undetectably high.   CBC is still pending   She has made about 75ml UOP today. Remains on NE, though decr dose from this morning.  I tried to reach husband, Clair Gulling, to discuss. Particularly concerned about her persistent acidosis and worsening renal failure. Not sure if we have a hard indication in this moment for CRRT but think we are likely headed that direction. From my conversation earlier with Clair Gulling I do believe he would accept CRRT if offered.  Will consult neprho.   CCM Interval progress note   66 yo POD 0 exlap, Left colectomy in setting of pneumoperitoneum due to perforated left colon,  feculent peritonitis. Abdomen is open.   Varying pressor requirement during case EBL 500 (op note) to 700 (anesthetic record) Completed 1 of 2 PRBC; 2nd PRBC running on arrival back to ICU, as well as 2L Lr and 513ml albumin Last gas with anesthesia with worsening metabolic acidosis, pH down to 7.0 despite supportive measures  Foley was placed at start of case, only 104ml UOP during case Received roc and versed before transport  Arrives back to ICU on NE at 30.     Perforated L colon s/p L colectomy (7/5)  -open abdomen, in discontinuity. Abthera in place  Septic shock in immunocompromised host due to feculent peritonitis, perforated L colon Severe metabolic acidosis, lactic acidosis AKI, now with oliguria Acute liver injury  Acute respiratory failure  Metabolic encephalopathy Hypoglycemia P -RR up to 34 on our vent -prop gtt and fent gtt + PRN versed for RASS -4  -CXR, Abg in 1 hr -NE for MAP > 65 -will give an amp of bicarb, incr rate on biacarb gtt to  125 -additional LR now -after completion of 2nd PRBC, send CBC CMP DIC panel LA   -strict I/O, keep foley -q2 CBGs -tentatively, back to OR 7/7  -vac per CCS  -NPO, but will need early and aggressive nutrition support  -pip/tazo and fluc      Additional critical care time: 37 minutes  Eliseo Gum MSN, AGACNP-BC [place] for pager 10/01/2021, 11:06 AM

## 2021-09-22 NOTE — Procedures (Signed)
Central Venous Catheter Insertion Procedure Note  Christie Thomas  [id number]  06-Jun-1955  Date:09/20/2021  Time:5:06 PM   Provider Performing:Sheli Dorin E Julez Huseby   Procedure: Insertion of Non-tunneled Central Venous Catheter(36556)with US guidance (82060)    Indication(s) Hemodialysis  Consent Risks of the procedure as well as the alternatives and risks of each were explained to the patient and/or caregiver.  Consent for the procedure was obtained and is signed in the bedside chart  Anesthesia Topical only with 1% lidocaine   Timeout Verified patient identification, verified procedure, site/side was marked, verified correct patient position, special equipment/implants available, medications/allergies/relevant history reviewed, required imaging and test results available.  Sterile Technique Maximal sterile technique including full sterile barrier drape, hand hygiene, sterile gown, sterile gloves, mask, hair covering, sterile ultrasound probe cover (if used).  Procedure Description Area of catheter insertion was cleaned with chlorhexidine and draped in sterile fashion.   With real-time ultrasound guidance a HD catheter was placed into the left femoral vein.  Prior to dilation of vessel and placement of catheter, guidewire positioning within L femoral vein was verified via ultrasound. After catheter placed, nonpulsatile blood flow and easy flushing noted in all ports.  The catheter was sutured in place and sterile dressing applied.  Complications/Tolerance None; patient tolerated the procedure well.   EBL Minimal  Specimen(s) None    Eliseo Gum MSN, AGACNP-BC [place] for pager  09/29/2021, 5:08 PM

## 2021-09-22 NOTE — Progress Notes (Signed)
Notified Lab that ABG being sent for analysis. 

## 2021-09-22 NOTE — Progress Notes (Addendum)
    OVERNIGHT PROGRESS REPORT   Notified by RN and RR RN for increased HR, RR, and pain unrelieved by previous meds. Additional Lasix ordered due to lower extremity edema. Patient states that this feels as earlier episode did in reference to her breathing and discomfort. Lasix given. Due to pain and increased HR/tachypnea a one time dose of Morphine was ordered.Pain was 6/10 and reduced to 2-3/10 after morphine with patient expressing improvement in pain and breathing.  IVF reduced to [facility] for the immediate timeframe. Bladder scan shows <20 cc although patient did urinate within an hour prior. No fever Additional Lopressor ordered if maintains 120 or greater HR after Morphine.  Cardiac monitoring ordered.  Previously ordered anti-anxiety medication to be given. House Physician notified.  Update: Revisited on rounds  0510 hrs patient remains restless but able to answer.  CXR ordered -pending ABG ordered - pending BNP added - pending  Lasix ordered  Liter IVF bolus ordered Repeat abg Bicarb- ordered  CCM - consult - in process Transfer to SDU/ICU ordered-in progress.  Labs returned as:  Latest Reference Range & Units 10/04/2021 05:50  pH, Arterial 7.35 - 7.45  7.22 (L)  pCO2 arterial 32 - 48 mmHg 20 (L)  pO2, Arterial 83 - 108 mmHg 78 (L)  Acid-base deficit 0.0 - 2.0 mmol/L 17.7 (H)  Bicarbonate 20.0 - 28.0 mmol/L 8.0 (L)  O2 Saturation % 94  Patient temperature  36.5  Allens test (pass/fail) PASS  PASS  (L): Data is abnormally low (H): Data is abnormally high    Latest Reference Range & Units 09/29/2021 02:53  COMPREHENSIVE METABOLIC PANEL  Rpt !  Sodium 135 - 145 mmol/L 149 (H)  Potassium 3.5 - 5.1 mmol/L 4.1  Chloride 98 - 111 mmol/L 116 (H)  CO2 22 - 32 mmol/L 15 (L)  Glucose 70 - 99 mg/dL 103 (H)  BUN 8 - 23 mg/dL 61 (H)  Creatinine 0.44 - 1.00 mg/dL 1.91 (H)  Calcium 8.9 - 10.3 mg/dL 8.9  Anion gap 5 - 15  18 (H)  Magnesium 1.7 - 2.4 mg/dL 3.1 (H)  Alkaline  Phosphatase 38 - 126 U/L 73  Albumin 3.5 - 5.0 g/dL 2.3 (L)  AST 15 - 41 U/L 123 (H)  ALT 0 - 44 U/L 72 (H)  Total Protein 6.5 - 8.1 g/dL 5.1 (L)  Total Bilirubin 0.3 - 1.2 mg/dL 1.2  GFR, Estimated >60 mL/min 29 (L)  !: Data is abnormal (H): Data is abnormally high (L): Data is abnormally low Rpt: View report in Results Review for more information   Latest Reference Range & Units 09/20/2021 02:53  B Natriuretic Peptide 0.0 - 100.0 pg/mL 200.3 (H)  (H): Data is abnormally high  Consults are notified and will see patient E-Link is aware and following CT is pending   Intubated - CCM is on site     Gershon Cull MSNA MSN ACNPC-AG Acute Care Nurse Practitioner [place]

## 2021-09-22 NOTE — Progress Notes (Signed)
2 RT's assisted with transporting PT to [facility] OR while on ventilator (100% Fi02)- uneventful.

## 2021-09-22 NOTE — Progress Notes (Incomplete)
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  Unclear if this is colitis from chemotherapy induced mucosal inflammation, bacterial infection versus stercoral colitis -Still has not had bowel movement yet.  GI following.  Patient could not complete bowel.  Patient apparently cannot tolerate MiraLAX.  Continue Senokot -Continue pain management   Breast cancer currently undergoing chemotherapy -Oncology following.     Sinus tachycardia -Has had episodes of intermittent sinus tachycardia since 09/18/2021.  Use IV metoprolol if needed.   Fluid overload -Patient has been on fluids IV since admission and is currently showing signs of fluid overload including lower extremity swelling and some hypoxia.  Will give 1 dose of IV Lasix 60 mg.   Leukocytosis -Possibly from Neupogen use.  Resolved   Leukopenia -Resolved.  WBC 9.5 today.  Currently on Neupogen as per oncology.   Anemia of chronic disease -From cancer and chemotherapy.   Hypokalemia -Improved.   Hyperlipidemia -hold statin and fenofibrate until GI symptoms improve   Obesity -Outpatient follow-up   Anxiety -Continue Klonopin at night    -Albumin 50 g x 1 - EKG   Body mass index is 30.88 kg/m.  *** Mobility Assessment  (last 72 hours)     Mobility Assessment     Row Name 09/21/21 0845 09/20/21 0835         Does patient have an order for bedrest or is patient medically unstable No - Continue assessment No - Continue assessment      What is the highest level of mobility based on the progressive mobility assessment? Level 6 (Walks independently in room and hall) - Balance while walking in room without assist - Complete Level 5 (Walks with assist in room/hall) - Balance while stepping forward/back and can walk in room with assist - Complete               DVT prophylaxis: *** Code Status: *** Family Communication: *** Status is: Inpatient  {Inpatient:23812}  Dispo: The patient is from: {From:23814}              Anticipated d/c is to: {To:23815}              Anticipated d/c date is: {Days:23816}              Patient currently {Medically stable:23817}      Consultants:  ***  Procedures/Significant Events:  ***   I have personally reviewed and interpreted all radiology studies and my findings are as above.  VENTILATOR SETTINGS: ***   Cultures ***  Antimicrobials: ***   Devices ***   LINES / TUBES:  ***    Continuous Infusions:  sodium chloride Stopped (09/28/2021 0200)   piperacillin-tazobactam (ZOSYN)  IV 3.375 g (10/11/2021 0513)     Objective: Vitals:   10/10/2021 0300 10/09/2021 0323 09/27/2021 0416 10/15/2021 0430  BP:   Marland Kitchen)  106/51 111/73  Pulse:      Resp: (!) 32 (!) 34 (!) 41 (!) 40  Temp:   97.7 F (36.5 C)   TempSrc:   Axillary   SpO2:   92%   Weight:      Height: '5\' 4"'$  (1.626 m)       Intake/Output Summary (Last 24 hours) at 10/02/2021 0650 Last data filed at 10/03/2021 0200 Gross per 24 hour  Intake 1479.72 ml  Output --  Net 1479.72 ml   Filed Weights   09/07/2021 0728  Weight: 81.6 kg    Examination:  General: No acute respiratory distress Eyes: negative scleral hemorrhage, negative anisocoria, negative icterus*** ENT: Negative Runny nose, negative  gingival bleeding,*** Neck:  Negative scars, masses, torticollis, lymphadenopathy, JVD*** Lungs: Clear to auscultation bilaterally without wheezes or crackles Cardiovascular: Regular rate and rhythm without murmur gallop or rub normal S1 and S2 Abdomen: negative abdominal pain, nondistended, positive soft, bowel sounds, no rebound, no ascites, no appreciable mass Extremities: No significant cyanosis, clubbing, or edema bilateral lower extremities Skin: Negative rashes, lesions, ulcers*** Psychiatric:  Negative depression, negative anxiety, negative fatigue, negative mania *** Central nervous system:  Cranial nerves II through XII intact, tongue/uvula midline, all extremities muscle strength 5/5, sensation intact throughout, finger nose finger bilateral within normal limits, quick finger touch bilateral within normal limits, negative Romberg sign, heel to shin bilateral within normal limits, standing on 1 foot bilateral within normal limits, walking on tiptoes within normal limits, walking on heels within normal limits, negative dysarthria, negative expressive aphasia, negative receptive aphasia.***  .     Data Reviewed: Care during the described time interval was provided by me .  I have reviewed this patient's available data, including medical history, events of note, physical examination, and all test results as part of my evaluation.   CBC: Recent Labs  Lab 09/19/21 0310 09/20/21 0335 09/21/21 0700 09/21/21 1336 10/10/2021 0253  WBC 0.6* 0.7* 9.5 14.5* 34.1*  NEUTROABS 0.2* 0.2* 7.2 8.8* 25.9*  HGB 10.7* 9.7* 11.6* 13.3 14.0  HCT 32.8* 30.3* 36.5 41.8 44.9  MCV 94.3 95.0 95.1 94.6 96.8  PLT 174 159 164 222 034   Basic Metabolic Panel: Recent Labs  Lab 09/18/21 0532 09/19/21 0310 09/20/21 0335 09/21/21 0700 10/12/2021 0253  NA 139 140 143 145 149*  K 3.7 3.3* 3.6 3.5 4.1  CL 108 108 110 113* 116*  CO2 '25 23 24 24 '$ 15*  GLUCOSE 157* 139* 114* 116* 103*  BUN 37* 55* 41* 32* 61*   CREATININE 0.70 1.12* 0.57 0.54 1.91*  CALCIUM 8.6* 8.6* 8.7* 8.6* 8.9  MG  --   --  2.6* 2.2 3.1*   GFR: Estimated Creatinine Clearance: 30 mL/min (A) (by C-G formula based on SCr of 1.91 mg/dL (H)). Liver Function Tests: Recent Labs  Lab 09/18/21 0532 09/19/21 0310 09/20/21 0335 09/21/21 0700 09/29/2021 0253  AST '21 19 16 25 '$ 123*  ALT '22 19 17 20 '$ 72*  ALKPHOS 39 30* 30* 61 73  BILITOT 1.8* 1.5* 1.1 1.3* 1.2  PROT 6.0* 5.9* 5.9* 5.7* 5.1*  ALBUMIN 3.0* 2.9* 2.7* 2.5* 2.3*   Recent Labs  Lab 08/22/2021 0850  LIPASE 45   No results for input(s): "AMMONIA" in the last 168 hours. Coagulation Profile: No results for input(s): "INR", "PROTIME" in the last 168 hours. Cardiac Enzymes: No results for input(s): "CKTOTAL", "CKMB", "CKMBINDEX", "TROPONINI" in the last 168 hours. BNP (last 3 results) No results for input(s): "PROBNP" in the last  8760 hours. HbA1C: No results for input(s): "HGBA1C" in the last 72 hours. CBG: Recent Labs  Lab 09/21/21 2315  GLUCAP 124*   Lipid Profile: No results for input(s): "CHOL", "HDL", "LDLCALC", "TRIG", "CHOLHDL", "LDLDIRECT" in the last 72 hours. Thyroid Function Tests: No results for input(s): "TSH", "T4TOTAL", "FREET4", "T3FREE", "THYROIDAB" in the last 72 hours. Anemia Panel: No results for input(s): "VITAMINB12", "FOLATE", "FERRITIN", "TIBC", "IRON", "RETICCTPCT" in the last 72 hours. Urine analysis:    Component Value Date/Time   COLORURINE YELLOW 09/04/2021 1014   APPEARANCEUR CLEAR 08/22/2021 1014   LABSPEC 1.024 09/12/2021 1014   PHURINE 5.0 08/28/2021 1014   GLUCOSEU NEGATIVE 09/07/2021 1014   GLUCOSEU NEGATIVE 03/21/2019 1042   HGBUR NEGATIVE 08/24/2021 1014   BILIRUBINUR NEGATIVE 08/29/2021 1014   BILIRUBINUR negative 02/19/2019 1104   [place] 09/06/2021 1014   PROTEINUR NEGATIVE 09/17/2021 1014   UROBILINOGEN 0.2 03/21/2019 1042   NITRITE NEGATIVE 08/20/2021 1014   LEUKOCYTESUR NEGATIVE 09/01/2021 1014    Sepsis Labs: '@LABRCNTIP'$ (procalcitonin:4,lacticidven:4)  )No results found for this or any previous visit (from the past 240 hour(s)).       Radiology Studies: DG CHEST PORT 1 VIEW  Result Date: 10/10/2021 CLINICAL DATA:  Increasing shortness of breath, hypoxia, abdominal distention. EXAM: PORTABLE CHEST 1 VIEW, ABDOMEN PORTABLE ONE VIEW COMPARISON:  08/01/2021, 09/17/2021. FINDINGS: The heart size and mediastinal contours are within normal limits. Lung volumes are extremely low with patchy airspace disease at the lung bases. No effusion or pneumothorax. A stable left chest port is noted. Surgical clips are present in the right chest wall. No acute osseous abnormality. Nonobstructive bowel-gas pattern. Surgical clips are noted in the right upper quadrant. No unusual calcification. IMPRESSION: 1. Low lung volumes with patchy atelectasis or infiltrate at the lung bases. 2. Nonobstructive bowel-gas pattern. Electronically Signed   By: Brett Fairy M.D.   On: 10/06/2021 04:19   DG Abd Portable 1V  Result Date: 10/08/2021 CLINICAL DATA:  Increasing shortness of breath, hypoxia, abdominal distention. EXAM: PORTABLE CHEST 1 VIEW, ABDOMEN PORTABLE ONE VIEW COMPARISON:  08/01/2021, 09/17/2021. FINDINGS: The heart size and mediastinal contours are within normal limits. Lung volumes are extremely low with patchy airspace disease at the lung bases. No effusion or pneumothorax. A stable left chest port is noted. Surgical clips are present in the right chest wall. No acute osseous abnormality. Nonobstructive bowel-gas pattern. Surgical clips are noted in the right upper quadrant. No unusual calcification. IMPRESSION: 1. Low lung volumes with patchy atelectasis or infiltrate at the lung bases. 2. Nonobstructive bowel-gas pattern. Electronically Signed   By: Brett Fairy M.D.   On: 10/03/2021 04:19        Scheduled Meds:  budesonide (PULMICORT) nebulizer solution  0.25 mg Nebulization BID   Chlorhexidine  Gluconate Cloth  6 each Topical Daily   dexamethasone (DECADRON) injection  4 mg Intravenous Q12H   diphenhydrAMINE  25 mg Intravenous Once   enoxaparin (LOVENOX) injection  40 mg Subcutaneous QHS   famotidine  40 mg Oral BID   feeding supplement  237 mL Oral BID BM   fluconazole  100 mg Oral Daily   multivitamin with minerals  1 tablet Oral Daily   Naloxone HCl  0.4 mg Intramuscular Once   mouth rinse  15 mL Mouth Rinse 4 times per day   polyethylene glycol  17 g Oral Once   potassium chloride  40 mEq Oral Once   protein supplement  1 Scoop Oral TID WC   senna-docusate  2 tablet Oral BID   sodium bicarbonate  50 mEq Intravenous Once   sodium phosphate  1 enema Rectal Once   Tbo-Filgrastim  480 mcg Subcutaneous q1800   Continuous Infusions:  sodium chloride Stopped (10/02/2021 0200)   piperacillin-tazobactam (ZOSYN)  IV 3.375 g (09/30/2021 0513)     LOS: 7 days   The patient is critically ill with multiple organ systems failure and requires high complexity decision making for assessment and support, frequent evaluation and titration of therapies, application of advanced monitoring technologies and extensive interpretation of multiple databases. Critical Care Time devoted to patient care services described in this note  Time spent: 40 minutes***     Dionisios Ricci, Geraldo Docker, MD [facility]   If 7PM-7AM, please contact night-coverage 09/28/2021, 6:50 AM

## 2021-09-22 NOTE — Consult Note (Signed)
Reason for Consult: ARF Referring Physician: Verlee Monte, MD  Christie Thomas is an 66 y.o. female with a PMH significant for breast cancer s/p lumpectomy currently on chemotherapy, diverticulitis, HLD, GERD, and asthma who was admitted on 09/08/2021 with abdominal pain.  In the ED, VSS and afebrile.  CT scan of abdomen revealed colonic distension, diffuse colitis, diverticulitis, and developing obstruction.  She was admitted and started on antibiotics.  She had an abrupt decline in her clinical condition with AMS, worsening abdominal pain, and hypotension.  Repeat CT scan c/w bowel perforation and was transferred to ICU, intubated, and started on pressors.  She was taken to the OR for exploratory lap, s/p left colectomy left in discontinuity with open abdomen and wound vac.  We were consulted after she developed oliguric ARF and refractory metabolic acidosis.  The trend in Scr is seen below and has only produced 60 mL of urine over the past 24 hours.   Trend in Creatinine: Creatinine, Ser  Date/Time Value Ref Range Status  10/10/2021 02:01 PM 2.28 (H) 0.44 - 1.00 mg/dL Final  10/07/2021 02:53 AM 1.91 (H) 0.44 - 1.00 mg/dL Final  09/21/2021 07:00 AM 0.54 0.44 - 1.00 mg/dL Final  09/20/2021 03:35 AM 0.57 0.44 - 1.00 mg/dL Final  09/19/2021 03:10 AM 1.12 (H) 0.44 - 1.00 mg/dL Final  09/18/2021 05:32 AM 0.70 0.44 - 1.00 mg/dL Final  09/17/2021 04:50 AM 0.60 0.44 - 1.00 mg/dL Final  09/16/2021 05:11 AM 0.70 0.44 - 1.00 mg/dL Final  09/16/2021 08:50 AM 1.01 (H) 0.44 - 1.00 mg/dL Final  09/14/2021 08:30 AM 0.92 0.44 - 1.00 mg/dL Final  08/31/2021 09:37 AM 1.22 (H) 0.44 - 1.00 mg/dL Final  08/24/2021 08:20 AM 0.88 0.44 - 1.00 mg/dL Final  08/18/2021 11:39 AM 0.77 0.40 - 1.20 mg/dL Final  08/07/2021 03:48 AM 0.60 0.44 - 1.00 mg/dL Final  08/06/2021 04:33 AM 0.70 0.44 - 1.00 mg/dL Final  08/05/2021 03:34 AM 0.59 0.44 - 1.00 mg/dL Final  08/04/2021 03:37 AM 0.67 0.44 - 1.00 mg/dL Final  08/03/2021 03:42  AM 0.78 0.44 - 1.00 mg/dL Final  08/02/2021 08:23 AM 0.64 0.44 - 1.00 mg/dL Final  08/01/2021 04:00 PM 0.76 0.44 - 1.00 mg/dL Final  07/22/2021 08:50 AM 0.95 0.44 - 1.00 mg/dL Final  06/08/2021 09:06 AM 0.96 0.44 - 1.00 mg/dL Final  05/10/2021 08:39 AM 1.06 (H) 0.44 - 1.00 mg/dL Final  08/11/2020 11:02 AM 1.02 0.40 - 1.20 mg/dL Final  02/11/2020 09:32 AM 1.01 0.40 - 1.20 mg/dL Final  08/06/2019 11:11 AM 0.97 0.40 - 1.20 mg/dL Final  01/26/2018 02:47 PM 1.02 0.40 - 1.20 mg/dL Final  07/28/2017 10:42 AM 0.68 0.40 - 1.20 mg/dL Final  07/04/2016 09:49 AM 0.90 0.40 - 1.20 mg/dL Final  09/02/2015 10:01 AM 1.00 0.40 - 1.20 mg/dL Final  03/04/2015 10:54 AM 1.03 0.40 - 1.20 mg/dL Final  08/28/2014 09:58 AM 0.99 0.40 - 1.20 mg/dL Final  02/26/2014 09:53 AM 0.9 0.4 - 1.2 mg/dL Final  08/27/2013 11:12 AM 0.9 0.4 - 1.2 mg/dL Final  12/24/2012 09:53 AM 0.8 0.4 - 1.2 mg/dL Final  02/25/2011 07:15 AM 0.88 0.50 - 1.10 mg/dL Final    PMH:   Past Medical History:  Diagnosis Date   Anxiety 01/20/2016   Asthma    Cancer ([place]) 02/09/2021   right breast   Diverticulitis 01/20/2016   Diverticulosis 01/20/2016   Epilepsy ([place]) 01/20/2016   Adolescent    Fatty liver 01/20/2016   GERD (gastroesophageal reflux disease)  Hay fever    Hyperlipidemia    IBS (irritable bowel syndrome) 01/20/2016   Obesity 01/20/2016   OSA (obstructive sleep apnea) 09/07/2017   Osteoarthritis of both hands 01/20/2016   PONV (postoperative nausea and vomiting)    Swelling of ankle joint, left 01/20/2016   Trochanteric bursitis 01/20/2016    PSH:   Past Surgical History:  Procedure Laterality Date   AUGMENTATION MAMMAPLASTY     BACK SURGERY     x2   BREAST BIOPSY     fatty tumor   BREAST ENHANCEMENT SURGERY     BREAST LUMPECTOMY WITH RADIOACTIVE SEED AND SENTINEL LYMPH NODE BIOPSY Right 05/17/2021   Procedure: RIGHT BREAST LUMPECTOMY WITH RADIOACTIVE SEED;  Surgeon: Jovita Kussmaul, MD;  Location: [place];   Service: General;  Laterality: Right;   BREAST RECONSTRUCTION Bilateral 05/17/2021   Procedure: BREAST RECONSTRUCTION REMOVAL IMPLANTS AND REPLACEMENT OF IMPLANTS;  Surgeon: Wallace Going, DO;  Location: [place];  Service: Plastics;  Laterality: Bilateral;   BREAST REDUCTION SURGERY Bilateral 05/17/2021   Procedure: ONCOPLASTIC BREAST REDUCTION;  Surgeon: Wallace Going, DO;  Location: [place];  Service: Plastics;  Laterality: Bilateral;   CARDIAC CATHETERIZATION  07/2010   CARPAL TUNNEL RELEASE     CATARACT EXTRACTION Bilateral    2023   CESAREAN SECTION     CHOLECYSTECTOMY     HAND SURGERY     carpal tunnel release, cyst excision   PORTACATH PLACEMENT N/A 07/26/2021   Procedure: INSERTION PORT-A-CATH;  Surgeon: Jovita Kussmaul, MD;  Location: [place];  Service: General;  Laterality: N/A;   SENTINEL NODE BIOPSY Right 05/17/2021   Procedure: RIGHT SENTINEL LYMPH NODE BIOPSY WITH MAGTRACE INJECTION;  Surgeon: Jovita Kussmaul, MD;  Location: [place];  Service: General;  Laterality: Right;   SHOULDER SURGERY     right   TONSILLECTOMY AND ADENOIDECTOMY      Allergies:  Allergies  Allergen Reactions   Shellfish Allergy Anaphylaxis   Cefepime Swelling    Lips swelling and numbness; chest tightness   Cephalexin     Other reaction(s): Unknown   Ciprofloxacin     Other reaction(s): Unknown   Codeine Other (See Comments)    headache   Metoclopramide Other (See Comments)   Latex Rash    Skin appears to be burned    Medications:   Prior to Admission medications   Medication Sig Start Date End Date Taking? Authorizing Provider  acetaminophen (TYLENOL) 500 MG tablet Take 500 mg by mouth every 6 (six) hours as needed for moderate pain.   Yes [provider]  antiseptic oral rinse (BIOTENE) LIQD 15 mLs by Mouth Rinse route 3 (three) times daily as needed for dry mouth.   Yes [provider]  clonazePAM (KLONOPIN) 0.5 MG tablet Take 0.5 tablets (0.25 mg total) by mouth at  bedtime. 08/07/21  Yes Aline August, MD  dexamethasone (DECADRON) 4 MG tablet Take 2 tablets (8 mg total) by mouth 2 (two) times daily. Start the day before Taxotere. Then again the day after chemo for 3 days. 07/16/21  Yes Volanda Napoleon, MD  famotidine (PEPCID) 40 MG tablet Take 1 tablet (40 mg total) by mouth 2 (two) times daily. 08/24/21  Yes Volanda Napoleon, MD  fenofibrate 160 MG tablet TAKE 1 TABLET ONCE DAILY WITH FOOD. Patient taking differently: Take 160 mg by mouth daily. 08/13/21  Yes Midge Minium, MD  fluconazole (DIFLUCAN) 200 MG tablet Take 1 tablet (200 mg total) by  mouth daily. Patient taking differently: Take 200 mg by mouth daily as needed (yeast). 08/31/21  Yes Volanda Napoleon, MD  fluticasone (FLONASE) 50 MCG/ACT nasal spray Place 1-2 sprays into both nostrils daily as needed for allergies. 08/07/21  Yes Aline August, MD  lactulose (CHRONULAC) 10 GM/15ML solution Take 30 mLs (20 g total) by mouth 2 (two) times daily as needed for mild constipation. 09/14/21  Yes Volanda Napoleon, MD  lidocaine-prilocaine (EMLA) cream Apply 1 application. topically daily as needed. Apply to affected area once 08/07/21  Yes Aline August, MD  naproxen sodium (ALEVE) 220 MG tablet Take 220 mg by mouth daily as needed (pain).   Yes [provider]  ondansetron (ZOFRAN) 8 MG tablet Take 1 tablet (8 mg total) by mouth 2 (two) times daily as needed for refractory nausea / vomiting. Start on day 3 after chemo. 07/16/21  Yes Volanda Napoleon, MD  OVER THE COUNTER MEDICATION daily. Ultra Flora IB   Yes [provider]  OVER THE COUNTER MEDICATION Take 1 capsule by mouth daily. EMMA for Gut health   Yes [provider]  prochlorperazine (COMPAZINE) 10 MG tablet Take 1 tablet (10 mg total) by mouth every 6 (six) hours as needed (Nausea or vomiting). Patient taking differently: Take 10 mg by mouth every 6 (six) hours as needed for vomiting or nausea (Nausea or vomiting).  07/16/21  Yes Ennever, Rudell Cobb, MD  Propylene Glycol (SYSTANE COMPLETE OP) Place 1 drop into both eyes daily as needed (dry eyes).   Yes [provider]  rosuvastatin (CRESTOR) 10 MG tablet Take 1 tablet (10 mg total) by mouth at bedtime. 08/07/21  Yes Aline August, MD  traMADol (ULTRAM) 50 MG tablet Take 50 mg by mouth every 6 (six) hours as needed for severe pain.   Yes [provider]  magic mouthwash SOLN Take 10 mLs by mouth 4 (four) times daily as needed for mouth pain. Patient not taking: Reported on 09/07/2021 08/31/21   Volanda Napoleon, MD  OLANZapine (ZYPREXA) 10 MG tablet Take 1 tablet (10 mg total) by mouth at bedtime. Patient not taking: Reported on 08/21/2021 08/31/21   Volanda Napoleon, MD    Inpatient medications:  budesonide (PULMICORT) nebulizer solution  0.25 mg Nebulization BID   Chlorhexidine Gluconate Cloth  6 each Topical Daily   dexamethasone (DECADRON) injection  4 mg Intravenous Q12H   docusate  100 mg Per Tube BID   feeding supplement  237 mL Oral BID BM   multivitamin with minerals  1 tablet Oral Daily   mouth rinse  15 mL Mouth Rinse Q2H   pantoprazole (PROTONIX) IV  40 mg Intravenous Daily    Discontinued Meds:   Medications Discontinued During This Encounter  Medication Reason   morphine (PF) 4 MG/ML injection 4 mg    Ampicillin-Sulbactam (UNASYN) 3 g in sodium chloride 0.9 % 100 mL IVPB    piperacillin-tazobactam (ZOSYN) IVPB 3.375 g    dexamethasone (DECADRON) injection 8 mg    lactated ringers infusion    famotidine (PEPCID) IVPB 20 mg premix    fentaNYL (SUBLIMAZE) injection 25 mcg    docusate sodium (COLACE) capsule 200 mg    polyethylene glycol (MIRALAX / GLYCOLAX) packet 17 g    clonazepam (KLONOPIN) disintegrating tablet 0.25 mg    metoprolol tartrate (LOPRESSOR) injection 2.5 mg    Oral care mouth rinse    Oral care mouth rinse    fentaNYL (SUBLIMAZE) 50 MCG/ML injection Returned to  ADS   succinylcholine (ANECTINE) 200  MG/10ML syringe Returned to ADS   ketamine HCl 50 MG/5ML SOSY Returned to ADS   famotidine (PEPCID) tablet 40 mg    polyethylene glycol (MIRALAX / GLYCOLAX) packet 17 g    clonazepam (KLONOPIN) disintegrating tablet 0.25 mg    fluconazole (DIFLUCAN) tablet 100 mg    sodium chloride irrigation 0.9 % Patient Transfer   traMADol (ULTRAM) tablet 50 mg    diphenhydrAMINE (BENADRYL) injection 25 mg    protein supplement (RESOURCE BENEPROTEIN) powder packet 6 g    potassium chloride (KLOR-CON) packet 40 mEq    sodium phosphate (FLEET) 7-19 GM/118ML enema 1 enema    senna-docusate (Senokot-S) tablet 2 tablet    Tbo-Filgrastim (GRANIX) injection 480 mcg    enoxaparin (LOVENOX) injection 40 mg    norepinephrine (LEVOPHED) '4mg'$  in 272m (0.016 mg/mL) premix infusion    enoxaparin (LOVENOX) injection 30 mg     Social History:  reports that she has never smoked. She has never used smokeless tobacco. She reports that she does not currently use alcohol. She reports that she does not use drugs.  Family History:   Family History  Problem Relation Age of Onset   Stroke Mother    Hypertension Mother    Hyperlipidemia Mother    Diabetes Mother    Cancer Mother    Sudden death Paternal Grandfather 380  Alzheimer's disease Father    Heart attack Neg Hx     Review of systems not obtained due to patient factors. Weight change:   Intake/Output Summary (Last 24 hours) at 10/14/2021 1554 Last data filed at 10/17/2021 1530 Gross per 24 hour  Intake 6007.13 ml  Output 760 ml  Net 5247.13 ml   BP (!) 93/55 (BP Location: Left Arm)   Pulse (!) 132   Temp 99.1 F (37.3 C)   Resp (!) 28   Ht '5\' 4"'$  (1.626 m)   Wt 81.6 kg   SpO2 91%   BMI 30.88 kg/m  Vitals:   10/14/2021 1045 10/05/2021 1200 10/17/2021 1300 09/30/2021 1548  BP:  (!) 86/23 (!) 93/55   Pulse:   (!) 108 (!) 132  Resp:  (!) 34 (!) 34 (!) 28  Temp:   (!) 94.1 F (34.5 C) 99.1 F (37.3 C)  TempSrc:   Esophageal   SpO2: 95%  94% 91%  Weight:       Height:         General appearance: intubated and sedated Head: Normocephalic, without obvious abnormality, atraumatic Resp: ventilated breath sounds bilaterally Cardio: regular rate and rhythm, S1, S2 normal, no murmur, click, rub or gallop GI: distended, open abdomen with wound vac in place, hypoactive bowel sounds Extremities: extremities normal, atraumatic, no cyanosis or edema  Labs: Basic Metabolic Panel: Recent Labs  Lab 09/17/21 0450 09/18/21 0532 09/19/21 0310 09/20/21 0335 09/21/21 0700 10/02/2021 0253 10/02/2021 1401  NA 142 139 140 143 145 149* 147*  K 3.8 3.7 3.3* 3.6 3.5 4.1 3.9  CL 111 108 108 110 113* 116* 109  CO2 '24 25 23 24 24 '$ 15* 14*  GLUCOSE 121* 157* 139* 114* 116* 103* 161*  BUN 19 37* 55* 41* 32* 61* 62*  CREATININE 0.60 0.70 1.12* 0.57 0.54 1.91* 2.28*  ALBUMIN 3.4* 3.0* 2.9* 2.7* 2.5* 2.3* 2.2*  CALCIUM 8.7* 8.6* 8.6* 8.7* 8.6* 8.9 7.7*   Liver Function Tests: Recent Labs  Lab 09/21/21 0700 09/26/2021 0253 10/03/2021 1401  AST 25 123* 3,570*  ALT 20  72* 1,547*  ALKPHOS 61 73 134*  BILITOT 1.3* 1.2 2.0*  PROT 5.7* 5.1* 3.7*  ALBUMIN 2.5* 2.3* 2.2*   No results for input(s): "LIPASE", "AMYLASE" in the last 168 hours. No results for input(s): "AMMONIA" in the last 168 hours. CBC: Recent Labs  Lab 09/20/21 0335 09/21/21 0700 09/21/21 1336 10/11/2021 0253 10/04/2021 1401  WBC 0.7* 9.5 14.5* 34.1*  --   NEUTROABS 0.2* 7.2 8.8* 25.9*  --   HGB 9.7* 11.6* 13.3 14.0  --   HCT 30.3* 36.5 41.8 44.9  --   MCV 95.0 95.1 94.6 96.8  --   PLT 159 164 222 165 PENDING   PT/INR: '@LABRCNTIP'$ (inr:5) Cardiac Enzymes: )No results for input(s): "CKTOTAL", "CKMB", "CKMBINDEX", "TROPONINI" in the last 168 hours. CBG: Recent Labs  Lab 09/21/21 2315 09/21/2021 0818 09/30/2021 0906 10/04/2021 1139  GLUCAP 124* 40* 130* 121*    Iron Studies: No results for input(s): "IRON", "TIBC", "TRANSFERRIN", "FERRITIN" in the last 168 hours.  Xrays/Other Studies: DG  CHEST PORT 1 VIEW  Result Date: 09/19/2021 CLINICAL DATA:  Endotracheally intubated EXAM: PORTABLE CHEST 1 VIEW COMPARISON:  Chest radiograph 10/07/2021 FINDINGS: Endotracheal tube tip overlies the distal trachea approximately 0.9 cm above the carina. Recommend retraction by 2.0-2.5 Cm. Left neck chest port catheter tip overlies the superior cavoatrial junction. Nasogastric tube side port overlie the stomach. Unchanged cardiomediastinal silhouette. There are small bilateral pleural effusions with adjacent basilar atelectasis. Low lung volumes. No pneumothorax. Bones are unchanged. Known pneumoperitoneum. IMPRESSION: Endotracheal tube tip overlies the distal trachea approximately 0.9 cm above the carina. Recommend retraction by 2.0-2.5 cm. Small bilateral pleural effusions with adjacent basilar atelectasis. Low lung volumes. Known pneumoperitoneum. These results will be called to the ordering clinician or representative by the Radiologist Assistant, and communication documented in the PACS or [employer]. Electronically Signed   By: Maurine Simmering M.D.   On: 10/07/2021 11:25   DG Abd 1 View  Result Date: 10/10/2021 CLINICAL DATA:  NG tube placement EXAM: ABDOMEN - 1 VIEW COMPARISON:  Same day CT FINDINGS: Nasogastric tube tip and side port overlie the stomach. No evidence of bowel obstruction. Known pneumoperitoneum. IMPRESSION: Nasogastric tube tip and side port overlie the stomach. Known pneumoperitoneum. Electronically Signed   By: Maurine Simmering M.D.   On: 09/24/2021 11:19   CT ABDOMEN PELVIS WO CONTRAST  Addendum Date: 10/13/2021   ADDENDUM REPORT: 10/09/2021 07:11 ADDENDUM: Critical Value/emergent results were called by telephone at the time of interpretation on 10/02/2021 at 0701 hours to Dr. Ander Slade, Who verbally acknowledged these results. Electronically Signed   By: Genevie Ann M.D.   On: 10/18/2021 07:11   Result Date: 09/28/2021 CLINICAL DATA:  66 year old female with acute abdominal pain. History of  breast cancer, chemotherapy. EXAM: CT ABDOMEN AND PELVIS WITHOUT CONTRAST TECHNIQUE: Multidetector CT imaging of the abdomen and pelvis was performed following the standard protocol without IV contrast. RADIATION DOSE REDUCTION: This exam was performed according to the departmental dose-optimization program which includes automated exposure control, adjustment of the mA and/or kV according to patient size and/or use of iterative reconstruction technique. COMPARISON:  Noncontrast CT Abdomen and Pelvis 08/24/2021 and earlier. FINDINGS: Lower chest: Respiratory motion. No pericardial effusion. Small new layering pleural effusions and developing bilateral lower lobe consolidation. Hepatobiliary: Absent gallbladder as before. Free air and free fluid surrounding the liver. Negative noncontrast liver parenchyma. Pancreas: Some pneumoperitoneum in the lesser sac. Negative noncontrast pancreas. Spleen: Adjacent free air and free fluid but otherwise negative. Adrenals/Urinary Tract: Exophytic  simple fluid density and probably benign right upper pole renal cyst redemonstrated. Nonobstructed kidneys with no nephrolithiasis. Adrenal glands remain normal. Ureters are decompressed. Diminutive urinary bladder. Stomach/Bowel: Moderate to large volume pneumoperitoneum. Low-density free fluid in the abdomen and pelvis., And there is a relatively large 12.5 cm collection of extraluminal gas and stool suspected in the abdomen deep to the umbilicus on series 2, image 67. Increased sigmoid colon distention with stool, wall thickening, and inflammation with underlying diverticular disease. The upstream descending colon is decompressed and appears mildly inflamed. The transverse colon is decompressed and indistinct. The right colon contains fluid and is more decompressed. The appendix now also appears enlarged and indistinct on series 2, image 71. Stomach and duodenum are fluid-filled but relatively decompressed. Fluid-filled but nondilated  small bowel loops throughout the abdomen. Vascular/Lymphatic: Mild Aortoiliac calcified atherosclerosis. Normal caliber abdominal aorta. Vascular patency is not evaluated in the absence of IV contrast. No lymphadenopathy identified. Reproductive: Negative noncontrast appearance; small calcified fibroid. Other: Moderate low-density pelvic free fluid. Musculoskeletal: Stable visualized osseous structures., including mild T12 superior endplate deformity. IMPRESSION: 1. Perforated Bowel. Moderate to large volume of Free Air and small to moderate volume of free fluid in the abdomen, plus a large 12.5 cm suspected Extraluminal Collection Of Bowel Contents in the mesentery deep to the umbilicus. Progressive inflammation of all large and small bowel loops from the recent CT 08/24/2021. The sigmoid colon is most distended (with stool), but the appendix also appears abnormally dilated and indistinct now. Recommend surgery consultation. 2. New small layering pleural effusions and developing bilateral lower lobe consolidation, suspicious for Pneumonia. Electronically Signed: By: Genevie Ann M.D. On: 10/04/2021 06:56   DG CHEST PORT 1 VIEW  Result Date: 09/19/2021 CLINICAL DATA:  Increasing shortness of breath, hypoxia, abdominal distention. EXAM: PORTABLE CHEST 1 VIEW, ABDOMEN PORTABLE ONE VIEW COMPARISON:  08/01/2021, 09/17/2021. FINDINGS: The heart size and mediastinal contours are within normal limits. Lung volumes are extremely low with patchy airspace disease at the lung bases. No effusion or pneumothorax. A stable left chest port is noted. Surgical clips are present in the right chest wall. No acute osseous abnormality. Nonobstructive bowel-gas pattern. Surgical clips are noted in the right upper quadrant. No unusual calcification. IMPRESSION: 1. Low lung volumes with patchy atelectasis or infiltrate at the lung bases. 2. Nonobstructive bowel-gas pattern. Electronically Signed   By: Brett Fairy M.D.   On: 10/13/2021  04:19   DG Abd Portable 1V  Result Date: 09/27/2021 CLINICAL DATA:  Increasing shortness of breath, hypoxia, abdominal distention. EXAM: PORTABLE CHEST 1 VIEW, ABDOMEN PORTABLE ONE VIEW COMPARISON:  08/01/2021, 09/17/2021. FINDINGS: The heart size and mediastinal contours are within normal limits. Lung volumes are extremely low with patchy airspace disease at the lung bases. No effusion or pneumothorax. A stable left chest port is noted. Surgical clips are present in the right chest wall. No acute osseous abnormality. Nonobstructive bowel-gas pattern. Surgical clips are noted in the right upper quadrant. No unusual calcification. IMPRESSION: 1. Low lung volumes with patchy atelectasis or infiltrate at the lung bases. 2. Nonobstructive bowel-gas pattern. Electronically Signed   By: Brett Fairy M.D.   On: 10/16/2021 04:19     Assessment/Plan:  Oliguric ARF - presumably due to ischemic ATN in setting of sepsis.  No emergent indication to start CVVHD, however given severe lactic acidosis and ongoing need for pressor support will initiate today after temp HD catheter is placed by PCCM. Septic shock - due to bowel perforation and peritonitis.  Currently on levophed, zosy, and fluconazole per PCCM Lactic acidosis - severe.  Currently on bicarb drip.  Will use bicarb as post-filter replacement fluid with CVVHD. Perforated left colon - s/p left colectomy in discontinuity with wound vac in place.  Will likely require further surgical interventions once stable.  Hypernatremia - would change IVF's to 1/2 NS once CVVHD is initiated. Acute hypoxic respiratory failure - s/p intubation.  Vent settings per PCCM.    Christie Thomas 09/18/2021, 3:54 PM

## 2021-09-22 NOTE — Progress Notes (Signed)
Notified lab ABG sent for analysis

## 2021-09-22 NOTE — Progress Notes (Signed)
Pharmacy Antibiotic Note  Christie Thomas is a 66 y.o. female admitted on 09/03/2021. On 7/5, patient found to have perforated left colon and was taken for urgent ex lap. She is s/p left colectomy and is now in the ICU on mechanical ventilation. Abdomen open at this time with vac in place. Pharmacy has been consulted for Zosyn and fluconazole dosing for intra-abdominal infection. Tentative plan for patient to return to OR 7/7.  Plan: -Continue Zosyn 3.375 g IV q8h extended infusion -Increase fluconazole to 200 mg daily for systemic coverage  Height: '5\' 4"'$  (162.6 cm) Weight: 81.6 kg (179 lb 14.3 oz) IBW/kg (Calculated) : 54.7  Temp (24hrs), Avg:97.2 F (36.2 C), Min:94.1 F (34.5 C), Max:97.8 F (36.6 C)  Recent Labs  Lab 09/18/21 0532 09/19/21 0310 09/20/21 0335 09/21/21 0700 09/21/21 1336 09/18/2021 0253 10/08/2021 0410  WBC 3.2* 0.6* 0.7* 9.5 14.5* 34.1*  --   CREATININE 0.70 1.12* 0.57 0.54  --  1.91*  --   LATICACIDVEN  --   --   --   --   --   --  >9.0*    Estimated Creatinine Clearance: 30 mL/min (A) (by C-G formula based on SCr of 1.91 mg/dL (H)).    Allergies  Allergen Reactions   Shellfish Allergy Anaphylaxis   Cefepime Swelling    Lips swelling and numbness; chest tightness   Cephalexin     Other reaction(s): Unknown   Ciprofloxacin     Other reaction(s): Unknown   Codeine Other (See Comments)    headache   Metoclopramide Other (See Comments)   Latex Rash    Skin appears to be burned    Antimicrobials this admission: Zosyn 6/28 >> Fluconazole 6/29 >>   Microbiology results: 7/5 MRSA PCR: negative  Thank you for allowing pharmacy to be a part of this patient's care.  Tawnya Crook, PharmD, BCPS Clinical Pharmacist 10/14/2021 2:32 PM

## 2021-09-22 NOTE — Op Note (Signed)
10/15/2021  10:02 AM  PATIENT:  Christie Thomas  66 y.o. female  PRE-OPERATIVE DIAGNOSIS:  FREE AIR ABDOMEN  POST-OPERATIVE DIAGNOSIS:  PERFORATED LEFT COLON  PROCEDURE:  Procedure(s) with comments: EXPLORATORY LAPAROTOMY, LEFT COLECTOMY WITH MOBILIZATION SPLENIC FLEXURE, ABDOMINAL VACUUM DRESSING  SURGEON:  Surgeon(s) and Role:    * Jovita Kussmaul, MD - Primary  PHYSICIAN ASSISTANT:   ASSISTANTS: Melina Modena, PA   ANESTHESIA:   general  EBL:  500 mL   BLOOD ADMINISTERED: 2 units CC PRBC  DRAINS: none   LOCAL MEDICATIONS USED:  NONE  SPECIMEN:  Source of Specimen:  left colon and splenic flexure  DISPOSITION OF SPECIMEN:  PATHOLOGY  COUNTS:  YES  TOURNIQUET:  * No tourniquets in log *  DICTATION: .Dragon Dictation  After informed consent was obtained the patient was brought to the operating room and placed in the supine position on the operating table.  After adequate induction of general anesthesia the patient's abdomen was prepped with ChloraPrep, allowed to dry, and draped in usual sterile manner.  An appropriate timeout was performed.  A midline incision was made with a 10 blade knife.  The incision was carried through the skin and subcutaneous tissue sharply with the electrocautery until the linea alba is identified.  The linea alba was also incised with the electrocautery.  The preperitoneal space was probed bluntly with a hemostat until the peritoneum was opened and access was gained to the abdominal cavity.  A large amount of free air evacuated.  The rest of the incision was opened under direct vision.  The patient had a large stool filled left colon with perforation and fecal peritonitis.  The left colon was mobilized by incising its retroperitoneal attachment along the white line of Toldt.  A site was chosen at the colorectal junction where the colon felt softer.  The mesentery at this point was opened bluntly with finger dissection.  The colorectal junction area  was then divided by a single firing of a GIA-75 stapler.  The mesentery to the left colon was then taken down sharply with the LigaSure.  Once the dissection was above the area of the perforation I then found a place on the proximal left colon where the colon was softer.  The mesentery at this point was opened bluntly with finger dissection.  The colon was then divided at this point with a firing of a GIA 75 stapler.  The left colon was then removed from the patient and marked with a stitch on the proximal staple line.  The left colon was then sent to pathology for further evaluation.  The remaining colon did not appear to be healthy at the splenic flexure and there was not enough length to bring out an ostomy.  I then mobilized the splenic flexure with some blunt finger dissection and sharp dissection with the LigaSure.  I then chose a site on the distal transverse for division of the colon.  The mesentery at this point was opened by blunt hemostat dissection.  The distal transverse colon was then divided with a single firing of the GIA 75 stapler.  The abdomen was then irrigated with copious amounts of saline.  There was a tremendous amount of fecal contamination.  A couple of small bleeding points in the mesentery were controlled with 2-0 silk figure-of-eight stitches.  The pelvis and left abdomen were packed with gauze and pressure was held on these areas for several minutes.  The rest of the abdomen was irrigated  with copious amounts of saline.  All loculations were broken up.  The small bowel was run from the ligament of Treitz to the ileocecal valve and no other abnormalities were noted.  Because of the level of distress that the patient was then and because of the questionable appearance of the transverse colon I elected to vacuum pack the abdominal cavity open.  The vacuum dressing was applied to the abdominal cavity and placed under suction with a good seal.  The patient will remain intubated and be taken  back to the ICU for further resuscitation measures.  PLAN OF CARE: Admit to inpatient   PATIENT DISPOSITION:  ICU - intubated and critically ill.   Delay start of Pharmacological VTE agent (>24hrs) due to surgical blood loss or risk of bleeding: no

## 2021-09-22 NOTE — Progress Notes (Signed)
Patient is transferring to ICU 1226

## 2021-09-22 NOTE — Progress Notes (Signed)
Per MD order ET tube pulled back 2cm. ET tube now resides at 21 cm lip (originally intubated at 23cm lip.) RN aware.

## 2021-09-22 NOTE — Progress Notes (Signed)
  Echocardiogram 2D Echocardiogram has been performed.  Christie Thomas 10/04/2021, 5:40 PM

## 2021-09-22 NOTE — Progress Notes (Signed)
At 6:10, patient was brought to CT by this RN from Rm 1519. Patient was restless and anxious. Patient was trying to get off the CT bed and removed her port cath. When patient was brought to ICU she responds to pain, tachypnic, tachycardic but can't find spot to get her O2 sat. Placed her on NRB, gave 2 doses of Narcan IM and patient responded to voice. Tried 4x to insert PIV but failed. IV team finally was able to reinsert port cath on L chest and started intubation procedure at 7:20. This RN will continue to monitor and hand-off info to the next RN who is already at bedside.

## 2021-09-22 NOTE — Progress Notes (Signed)
Her Lactic acid is 9.0. Dr. Nevada Crane and Olena Heckle NP notified.

## 2021-09-22 NOTE — Progress Notes (Signed)
Patient is so restless and keep on saying, "I have to sleep, I have to sleep" She has pulled out her accessed port,  ripped her cardiac monitoring off and not keeping  her  02 on her nostrils.  I have notified Christie Thomas and awaiting for a response. Will continue to monitor.

## 2021-09-22 NOTE — Procedures (Addendum)
Central Venous Catheter Insertion Procedure Note  Christie Thomas  [id number]  [birth year]  Date:09/18/2021  Time:12:20 PM   Provider Performing:Aries Townley Jerilynn Mages Verlee Monte   Procedure: Insertion of Non-tunneled Central Venous Catheter(36556) with US guidance (51884)   Indication(s) Medication administration  Consent Unable to obtain consent due to emergent nature of procedure.  Anesthesia None  Timeout Verified patient identification, verified procedure, site/side was marked, verified correct patient position, special equipment/implants available, medications/allergies/relevant history reviewed, required imaging and test results available.  Sterile Technique Maximal sterile technique including full sterile barrier drape, hand hygiene, sterile gown, sterile gloves, mask, hair covering, sterile ultrasound probe cover (if used).  Procedure Description Area of catheter insertion was cleaned with chlorhexidine and draped in sterile fashion.  With real-time ultrasound guidance a central venous catheter was placed into the right femoral vein. Nonpulsatile blood flow and easy flushing noted in all ports.  The catheter was sutured in place and sterile dressing applied.  Complications/Tolerance None; patient tolerated the procedure well. Chest X-ray is ordered to verify placement for internal jugular or subclavian cannulation.   Chest x-ray is not ordered for femoral cannulation.  EBL Minimal  Specimen(s) None

## 2021-09-22 NOTE — TOC Progression Note (Signed)
Transition of Care [facility]) - Progression Note    Patient Details  Name: Christie Thomas MRN: [id number] Date of Birth: 1955/10/31  Transition of Care [facility] CM/SW Contact  Leeroy Cha, RN Phone Number: 10/16/2021, 7:29 AM  Clinical Narrative:    [phone] chart reviewed.  Following for toc needs.  Plan is tbd at this time due to patient condition and history.          Expected Discharge Plan and Services                                                 Social Determinants of Health (SDOH) Interventions    Readmission Risk Interventions     No data to display

## 2021-09-22 NOTE — Procedures (Signed)
Arterial Catheter Insertion Procedure Note  JHANAE JASKOWIAK  [id number]  1955/09/10  Date:10/05/2021  Time:12:19 PM    Provider Performing: Maryjane Hurter    Procedure: Insertion of Arterial Line [id number]) with US guidance (76147)   Indication(s) Blood pressure monitoring and/or need for frequent ABGs  Consent Unable to obtain consent due to emergent nature of procedure.  Anesthesia None   Time Out Verified patient identification, verified procedure, site/side was marked, verified correct patient position, special equipment/implants available, medications/allergies/relevant history reviewed, required imaging and test results available.   Sterile Technique Maximal sterile technique including full sterile barrier drape, hand hygiene, sterile gown, sterile gloves, mask, hair covering, sterile ultrasound probe cover (if used).   Procedure Description Area of catheter insertion was cleaned with chlorhexidine and draped in sterile fashion. With real-time ultrasound guidance an arterial catheter was placed into the right femoral artery.  Appropriate arterial tracings confirmed on monitor.     Complications/Tolerance None; patient tolerated the procedure well.   EBL Minimal   Specimen(s) None

## 2021-09-22 NOTE — Procedures (Signed)
Intubation Procedure Note  Christie Thomas  [id number]  1955-04-18  Date:09/21/2021  Time:7:30 AM   Provider Performing:Sol Englert A Dorthey Depace    Procedure: Intubation (41287)  Indication(s) Respiratory Failure  Consent Unable to obtain consent due to emergent nature of procedure.   Anesthesia Etomidate, Versed, Fentanyl, and Rocuronium 20 etomidate, 2 versed, 100 fentanyl and 40 rocuronium given  Time Out Verified patient identification, verified procedure, site/side was marked, verified correct patient position, special equipment/implants available, medications/allergies/relevant history reviewed, required imaging and test results available.   Sterile Technique Usual hand hygeine, masks, and gloves were used   Procedure Description Patient positioned in bed supine.  Sedation given as noted above.  Patient was intubated with endotracheal tube using Glidescope.  View was Grade 1 full glottis .  Number of attempts was 1.  Colorimetric CO2 detector was consistent with tracheal placement.   Complications/Tolerance None; patient tolerated the procedure well. Chest X-ray is ordered to verify placement.   EBL none   Specimen(s) None

## 2021-09-22 NOTE — Progress Notes (Signed)
I have requested for higher level of care for the patient from [facility] NP.

## 2021-09-22 NOTE — Transfer of Care (Signed)
Immediate Anesthesia Transfer of Care Note  Patient: Christie Thomas  Procedure(s) Performed: EXPLORATORY LAPAROTOMY  Patient Location: PACU and ICU  Anesthesia Type:General  Level of Consciousness: sedated  Airway & Oxygen Therapy: Patient remains intubated per anesthesia plan  Post-op Assessment: Report given to RN and Post -op Vital signs reviewed and stable  Post vital signs: Reviewed and stable  Last Vitals:  Vitals Value Taken Time  BP    Temp    Pulse    Resp    SpO2 96 % 10/09/2021 1030    Last Pain:  Vitals:   10/06/2021 0740  TempSrc: Axillary  PainSc:       Patients Stated Pain Goal: 3 (25/85/27 7824)  Complications: No notable events documented.

## 2021-09-22 NOTE — Progress Notes (Signed)
The patient's husband is here and he's updated on her transfer.

## 2021-09-22 NOTE — Consult Note (Signed)
NAME:  Christie Thomas, MRN:  [id number], DOB:  1955/08/09, LOS: 7 ADMISSION DATE:  08/27/2021, CONSULTATION DATE:  09/21/2021 REFERRING MD:  [place] Crane - TRH, CHIEF COMPLAINT:  AMS    History of Present Illness:  66 yo F PMH breast ca s/p lumpectomy and currently undergoing chemo (taxotere/cytoxan) who was admitted to [facility] 6/28 with abdominal pain. Abd pain had progressively worsened in the days leading up to admission. Associated constipation. CT a/p 6/28 without evidence of perf -- did show diffuse colitis, colonic distension.     During admission, was tx for colitis, had manual massage for constipation and was able to have a bowel movement. Continued to have abd pain. 7/4 night she unfortunately declined. She was more encephalopathic. Labs revealed a leukocytosis 34, serum bicarb 15, LA >9, as well as a new Aki and new acute liver injury.  She was sent for repeat ct a/p which unfortunately showed pneumoperitoneum.   She was transferred to the ICU with concern for unprotected airway in setting of worsening encephalopathy, and CCS was also consulted.   Pertinent  Medical History  Breast cancer   Significant Hospital Events: Including procedures, antibiotic start and stop dates in addition to other pertinent events   6/28 admit to [facility] with abd pain. CT without perf. Did have diffuse colitis, colonic distension  7/1 neutropenic WBC 3.2 7/2, 7/3 neutropenic WBC 0.6, 0.7  7/4 WBC 9.5 --> 14.5. Started acting more altered and confused. Then overnight decompensation 7/5 Bowel perf on CT. To ICU. Intubated. CVC, art line placed. Started on pressors. Being posted emergently to OR. Code status updated to no Compressions no CV  Interim History / Subjective:   Decompensated overnight.  Densely encephalopathic. Pulled out port a cath access twice.  WBC 34 LA > 9. New AKI and ALI.   Transferred to ICU without IV access   Objective   Blood pressure 134/85, pulse (!) 113, temperature (!) 97.4 F  (36.3 C), temperature source Axillary, resp. rate (!) 39, height '5\' 4"'$  (1.626 m), weight 81.6 kg, SpO2 100 %.        Intake/Output Summary (Last 24 hours) at 09/30/2021 0911 Last data filed at 10/08/2021 0902 Gross per 24 hour  Intake 1729.72 ml  Output --  Net 1729.72 ml   Filed Weights   09/13/2021 0728  Weight: 81.6 kg    Examination: General: critically and chronically ill adult F  HENT: NCAT. Anicteric sclera. Tacky mm  Lungs: Even unlabored but increased rate  Cardiovascular: tachycardic. Thready pulse. Cap refill is > 3 seconds  Abdomen: Tympanic. Distended  GU: no foley Extremities: Cool extremities. No edema, no clubbing  Skin: early mottling of lower extremities  Neuro: Roving eyes. Moving BUE spontaneously. Does not follow commands  Resolved Hospital Problem list     Assessment & Plan:   Acute metabolic encephalopathy  -in setting of below processes  -care as below  Acute respiratory failure in setting of encephalopathy above  -intubate in ICU -ABG / cxr post intubation (timing wise, cxr might be after case) -will put her on a high rate to try to help compensate for metabolic acidosis -VAP, pulm hygiene  Septic shock due to bowel perforation  Bowel perforation Colitis  -going to OR emergently for ex lap  -cont abx for intraabdominal coverage -CVC & art line in ICU -start NE -Will give 2L LR + some albumin  -NPO   Lactic acidosis  AGMA -in setting of above/below processes -giving bicarb pushes before intubation,  as well as bicarb gtt  AKI -fluids and pressors support  -bicarb gtt  -probably will need a foley during vs after OR   Acute liver injury  -supportive care -will need to trend LFTs coags   Breast cancer s/p lumpectomy and currently undergoing chemo -appreciate Onc following   Goals of Care -See IPAL note 7/5 -partial code status -- no CPR, no DF    Best Practice (right click and "Reselect all SmartList Selections" daily)    Diet/type: NPO DVT prophylaxis: SCD GI prophylaxis: PPI Lines: Central line and Arterial Line Foley:  N/A Code Status:  limited Last date of multidisciplinary goals of care discussion [7/5]  Labs   CBC: Recent Labs  Lab 09/19/21 0310 09/20/21 0335 09/21/21 0700 09/21/21 1336 10/08/2021 0253  WBC 0.6* 0.7* 9.5 14.5* 34.1*  NEUTROABS 0.2* 0.2* 7.2 8.8* 25.9*  HGB 10.7* 9.7* 11.6* 13.3 14.0  HCT 32.8* 30.3* 36.5 41.8 44.9  MCV 94.3 95.0 95.1 94.6 96.8  PLT 174 159 164 222 097    Basic Metabolic Panel: Recent Labs  Lab 09/18/21 0532 09/19/21 0310 09/20/21 0335 09/21/21 0700 09/20/2021 0253  NA 139 140 143 145 149*  K 3.7 3.3* 3.6 3.5 4.1  CL 108 108 110 113* 116*  CO2 '25 23 24 24 '$ 15*  GLUCOSE 157* 139* 114* 116* 103*  BUN 37* 55* 41* 32* 61*  CREATININE 0.70 1.12* 0.57 0.54 1.91*  CALCIUM 8.6* 8.6* 8.7* 8.6* 8.9  MG  --   --  2.6* 2.2 3.1*   GFR: Estimated Creatinine Clearance: 30 mL/min (A) (by C-G formula based on SCr of 1.91 mg/dL (H)). Recent Labs  Lab 09/20/21 0335 09/21/21 0700 09/21/21 1336 09/28/2021 0253 10/07/2021 0410  WBC 0.7* 9.5 14.5* 34.1*  --   LATICACIDVEN  --   --   --   --  >9.0*    Liver Function Tests: Recent Labs  Lab 09/18/21 0532 09/19/21 0310 09/20/21 0335 09/21/21 0700 10/05/2021 0253  AST '21 19 16 25 '$ 123*  ALT '22 19 17 20 '$ 72*  ALKPHOS 39 30* 30* 61 73  BILITOT 1.8* 1.5* 1.1 1.3* 1.2  PROT 6.0* 5.9* 5.9* 5.7* 5.1*  ALBUMIN 3.0* 2.9* 2.7* 2.5* 2.3*   No results for input(s): "LIPASE", "AMYLASE" in the last 168 hours.  No results for input(s): "AMMONIA" in the last 168 hours.  ABG    Component Value Date/Time   PHART 7.19 (LL) 09/24/2021 0815   PCO2ART 23 (L) 10/07/2021 0815   PO2ART 111 (H) 10/03/2021 0815   HCO3 9.0 (L) 10/04/2021 0815   ACIDBASEDEF 17.7 (H) 10/18/2021 0815   O2SAT 96.6 10/05/2021 0815     Coagulation Profile: No results for input(s): "INR", "PROTIME" in the last 168 hours.  Cardiac Enzymes: No  results for input(s): "CKTOTAL", "CKMB", "CKMBINDEX", "TROPONINI" in the last 168 hours.  HbA1C: Hgb A1c MFr Bld  Date/Time Value Ref Range Status  12/24/2012 09:53 AM 5.7 4.6 - 6.5 % Final    Comment:    Glycemic Control Guidelines for People with Diabetes:Non Diabetic:  <6%Goal of Therapy: <7%Additional Action Suggested:  >8%     CBG: Recent Labs  Lab 09/21/21 2315 09/27/2021 0818  GLUCAP 124* 40*    Review of Systems:   Unable to obtain due to encephalopathy   Past Medical History:  She,  has a past medical history of Anxiety (01/20/2016), Asthma, Cancer ([place]) (02/09/2021), Diverticulitis (01/20/2016), Diverticulosis (01/20/2016), Epilepsy ([place]) (01/20/2016), Fatty liver (01/20/2016), GERD (gastroesophageal reflux disease), Hay  fever, Hyperlipidemia, IBS (irritable bowel syndrome) (01/20/2016), Obesity (01/20/2016), OSA (obstructive sleep apnea) (09/07/2017), Osteoarthritis of both hands (01/20/2016), PONV (postoperative nausea and vomiting), Swelling of ankle joint, left (01/20/2016), and Trochanteric bursitis (01/20/2016).   Surgical History:   Past Surgical History:  Procedure Laterality Date   AUGMENTATION MAMMAPLASTY     BACK SURGERY     x2   BREAST BIOPSY     fatty tumor   BREAST ENHANCEMENT SURGERY     BREAST LUMPECTOMY WITH RADIOACTIVE SEED AND SENTINEL LYMPH NODE BIOPSY Right 05/17/2021   Procedure: RIGHT BREAST LUMPECTOMY WITH RADIOACTIVE SEED;  Surgeon: Jovita Kussmaul, MD;  Location: [facility];  Service: General;  Laterality: Right;   BREAST RECONSTRUCTION Bilateral 05/17/2021   Procedure: BREAST RECONSTRUCTION REMOVAL IMPLANTS AND REPLACEMENT OF IMPLANTS;  Surgeon: Wallace Going, DO;  Location: [facility];  Service: Plastics;  Laterality: Bilateral;   BREAST REDUCTION SURGERY Bilateral 05/17/2021   Procedure: ONCOPLASTIC BREAST REDUCTION;  Surgeon: Wallace Going, DO;  Location: [facility];  Service: Plastics;  Laterality: Bilateral;   CARDIAC CATHETERIZATION  07/2010    CARPAL TUNNEL RELEASE     CATARACT EXTRACTION Bilateral    2023   CESAREAN SECTION     CHOLECYSTECTOMY     HAND SURGERY     carpal tunnel release, cyst excision   PORTACATH PLACEMENT N/A 07/26/2021   Procedure: INSERTION PORT-A-CATH;  Surgeon: Jovita Kussmaul, MD;  Location: [facility];  Service: General;  Laterality: N/A;   SENTINEL NODE BIOPSY Right 05/17/2021   Procedure: RIGHT SENTINEL LYMPH NODE BIOPSY WITH MAGTRACE INJECTION;  Surgeon: Jovita Kussmaul, MD;  Location: [facility];  Service: General;  Laterality: Right;   SHOULDER SURGERY     right   TONSILLECTOMY AND ADENOIDECTOMY       Social History:   reports that she has never smoked. She has never used smokeless tobacco. She reports that she does not currently use alcohol. She reports that she does not use drugs.   Family History:  Her family history includes Alzheimer's disease in her father; Cancer in her mother; Diabetes in her mother; Hyperlipidemia in her mother; Hypertension in her mother; Stroke in her mother; Sudden death (age of onset: 60) in her paternal grandfather. There is no history of Heart attack.   Allergies Allergies  Allergen Reactions   Shellfish Allergy Anaphylaxis   Cefepime Swelling    Lips swelling and numbness; chest tightness   Cephalexin     Other reaction(s): Unknown   Ciprofloxacin     Other reaction(s): Unknown   Codeine Other (See Comments)    headache   Metoclopramide Other (See Comments)   Latex Rash    Skin appears to be burned     Home Medications  Prior to Admission medications   Medication Sig Start Date End Date Taking? Authorizing Provider  acetaminophen (TYLENOL) 500 MG tablet Take 500 mg by mouth every 6 (six) hours as needed for moderate pain.   Yes [provider]  antiseptic oral rinse (BIOTENE) LIQD 15 mLs by Mouth Rinse route 3 (three) times daily as needed for dry mouth.   Yes [provider]  clonazePAM (KLONOPIN) 0.5 MG tablet Take 0.5 tablets (0.25 mg total)  by mouth at bedtime. 08/07/21  Yes Aline August, MD  dexamethasone (DECADRON) 4 MG tablet Take 2 tablets (8 mg total) by mouth 2 (two) times daily. Start the day before Taxotere. Then again the day after chemo for 3 days. 07/16/21  Yes Burney Gauze  R, MD  famotidine (PEPCID) 40 MG tablet Take 1 tablet (40 mg total) by mouth 2 (two) times daily. 08/24/21  Yes Volanda Napoleon, MD  fenofibrate 160 MG tablet TAKE 1 TABLET ONCE DAILY WITH FOOD. Patient taking differently: Take 160 mg by mouth daily. 08/13/21  Yes Midge Minium, MD  fluconazole (DIFLUCAN) 200 MG tablet Take 1 tablet (200 mg total) by mouth daily. Patient taking differently: Take 200 mg by mouth daily as needed (yeast). 08/31/21  Yes Volanda Napoleon, MD  fluticasone (FLONASE) 50 MCG/ACT nasal spray Place 1-2 sprays into both nostrils daily as needed for allergies. 08/07/21  Yes Aline August, MD  lactulose (CHRONULAC) 10 GM/15ML solution Take 30 mLs (20 g total) by mouth 2 (two) times daily as needed for mild constipation. 09/14/21  Yes Volanda Napoleon, MD  lidocaine-prilocaine (EMLA) cream Apply 1 application. topically daily as needed. Apply to affected area once 08/07/21  Yes Aline August, MD  naproxen sodium (ALEVE) 220 MG tablet Take 220 mg by mouth daily as needed (pain).   Yes [provider]  ondansetron (ZOFRAN) 8 MG tablet Take 1 tablet (8 mg total) by mouth 2 (two) times daily as needed for refractory nausea / vomiting. Start on day 3 after chemo. 07/16/21  Yes Volanda Napoleon, MD  OVER THE COUNTER MEDICATION daily. Ultra Flora IB   Yes [provider]  OVER THE COUNTER MEDICATION Take 1 capsule by mouth daily. EMMA for Gut health   Yes [provider]  prochlorperazine (COMPAZINE) 10 MG tablet Take 1 tablet (10 mg total) by mouth every 6 (six) hours as needed (Nausea or vomiting). Patient taking differently: Take 10 mg by mouth every 6 (six) hours as needed for vomiting or nausea (Nausea or  vomiting). 07/16/21  Yes Ennever, Rudell Cobb, MD  Propylene Glycol (SYSTANE COMPLETE OP) Place 1 drop into both eyes daily as needed (dry eyes).   Yes [provider]  rosuvastatin (CRESTOR) 10 MG tablet Take 1 tablet (10 mg total) by mouth at bedtime. 08/07/21  Yes Aline August, MD  traMADol (ULTRAM) 50 MG tablet Take 50 mg by mouth every 6 (six) hours as needed for severe pain.   Yes [provider]  magic mouthwash SOLN Take 10 mLs by mouth 4 (four) times daily as needed for mouth pain. Patient not taking: Reported on 09/02/2021 08/31/21   Volanda Napoleon, MD  OLANZapine (ZYPREXA) 10 MG tablet Take 1 tablet (10 mg total) by mouth at bedtime. Patient not taking: Reported on 09/02/2021 08/31/21   Volanda Napoleon, MD     Critical care time: 60 minutes     CRITICAL CARE Performed by: Cristal Generous   Total critical care time: 60 minutes  Critical care time was exclusive of separately billable procedures and treating other patients. Critical care was necessary to treat or prevent imminent or life-threatening deterioration.  Critical care was time spent personally by me on the following activities: development of treatment plan with patient and/or surrogate as well as nursing, discussions with consultants, evaluation of patient's response to treatment, examination of patient, obtaining history from patient or surrogate, ordering and performing treatments and interventions, ordering and review of laboratory studies, ordering and review of radiographic studies, pulse oximetry and re-evaluation of patient's condition.  Eliseo Gum MSN, AGACNP-BC [place] for pager  10/18/2021, 9:11 AM

## 2021-09-22 NOTE — Progress Notes (Addendum)
[facility] Progress Note Patient Name: Christie Thomas DOB: 12/24/1955 MRN: [id number]   Date of Service  10/18/2021  HPI/Events of Note  Hypotension - BP = 58/23 with MAP = 36. Last Hgb = 11.5. ABG already drawn. Already on a NaHCO3 IV infusion.   eICU Interventions  Plan: Increase ceiling on Norpinephrine IV infusion to 70 mcg/min. Increase Vasopressin IV infusion to 0.04 units/min. NaHCO3 100 meq IV now. Await ABG results.  Monitor CVP now and Q 4 hours.      Intervention Category Major Interventions: Hypotension - evaluation and management  Yenesis Even Eugene 09/28/2021, 8:25 PM

## 2021-09-23 ENCOUNTER — Inpatient Hospital Stay (HOSPITAL_COMMUNITY): Payer: BC Managed Care – PPO

## 2021-09-23 ENCOUNTER — Encounter (HOSPITAL_COMMUNITY): Payer: Self-pay | Admitting: General Surgery

## 2021-09-23 ENCOUNTER — Other Ambulatory Visit: Payer: Self-pay | Admitting: Pharmacist

## 2021-09-23 ENCOUNTER — Telehealth: Payer: Self-pay | Admitting: *Deleted

## 2021-09-23 DIAGNOSIS — E8721 Acute metabolic acidosis: Secondary | ICD-10-CM

## 2021-09-23 DIAGNOSIS — K529 Noninfective gastroenteritis and colitis, unspecified: Secondary | ICD-10-CM

## 2021-09-23 DIAGNOSIS — J9601 Acute respiratory failure with hypoxia: Secondary | ICD-10-CM

## 2021-09-23 DIAGNOSIS — K631 Perforation of intestine (nontraumatic): Secondary | ICD-10-CM

## 2021-09-23 DIAGNOSIS — D6959 Other secondary thrombocytopenia: Secondary | ICD-10-CM

## 2021-09-23 DIAGNOSIS — Z170 Estrogen receptor positive status [ER+]: Secondary | ICD-10-CM

## 2021-09-23 DIAGNOSIS — K7290 Hepatic failure, unspecified without coma: Secondary | ICD-10-CM

## 2021-09-23 DIAGNOSIS — A419 Sepsis, unspecified organism: Secondary | ICD-10-CM | POA: Diagnosis not present

## 2021-09-23 DIAGNOSIS — I9589 Other hypotension: Secondary | ICD-10-CM

## 2021-09-23 DIAGNOSIS — R3912 Poor urinary stream: Secondary | ICD-10-CM

## 2021-09-23 LAB — CBC WITH DIFFERENTIAL/PLATELET
Abs Immature Granulocytes: 3.73 10*3/uL — ABNORMAL HIGH (ref 0.00–0.07)
Band Neutrophils: 1 %
Basophils Absolute: 0 10*3/uL (ref 0.0–0.1)
Basophils Relative: 0 %
Blasts: 0 %
Eosinophils Absolute: 0 10*3/uL (ref 0.0–0.5)
Eosinophils Relative: 0 %
HCT: 22.8 % — ABNORMAL LOW (ref 36.0–46.0)
Hemoglobin: 7.2 g/dL — ABNORMAL LOW (ref 12.0–15.0)
Lymphocytes Relative: 4 %
Lymphs Abs: 1.7 10*3/uL (ref 0.7–4.0)
MCH: 31.9 pg (ref 26.0–34.0)
MCHC: 31.6 g/dL (ref 30.0–36.0)
MCV: 100.9 fL — ABNORMAL HIGH (ref 80.0–100.0)
Metamyelocytes Relative: 4 %
Monocytes Absolute: 1.7 10*3/uL — ABNORMAL HIGH (ref 0.1–1.0)
Monocytes Relative: 4 %
Myelocytes: 4 %
Neutro Abs: 34.3 10*3/uL — ABNORMAL HIGH (ref 1.7–7.7)
Neutrophils Relative %: 82 %
Other: 0 %
Platelets: 56 10*3/uL — ABNORMAL LOW (ref 150–400)
Promyelocytes Relative: 1 %
RBC: 2.26 MIL/uL — ABNORMAL LOW (ref 3.87–5.11)
RDW: 18.4 % — ABNORMAL HIGH (ref 11.5–15.5)
WBC: 41.4 10*3/uL — ABNORMAL HIGH (ref 4.0–10.5)
nRBC: 14 /100 WBC — ABNORMAL HIGH
nRBC: 7.9 % — ABNORMAL HIGH (ref 0.0–0.2)

## 2021-09-23 LAB — URINE CULTURE: Culture: NO GROWTH

## 2021-09-23 LAB — BLOOD GAS, ARTERIAL
Acid-base deficit: 16.4 mmol/L — ABNORMAL HIGH (ref 0.0–2.0)
Bicarbonate: 10 mmol/L — ABNORMAL LOW (ref 20.0–28.0)
O2 Saturation: 97.6 %
Patient temperature: 36
pCO2 arterial: 24 mmHg — ABNORMAL LOW (ref 32–48)
pH, Arterial: 7.22 — ABNORMAL LOW (ref 7.35–7.45)
pO2, Arterial: 94 mmHg (ref 83–108)

## 2021-09-23 LAB — COMPREHENSIVE METABOLIC PANEL
ALT: 7648 U/L — ABNORMAL HIGH (ref 0–44)
AST: >10000 U/L — ABNORMAL HIGH (ref 15–41)
Albumin: <1.5 g/dL — ABNORMAL LOW (ref 3.5–5.0)
Alkaline Phosphatase: 453 U/L — ABNORMAL HIGH (ref 38–126)
Anion gap: 37 — ABNORMAL HIGH (ref 5–15)
BUN: 66 mg/dL — ABNORMAL HIGH (ref 8–23)
CO2: 11 mmol/L — ABNORMAL LOW (ref 22–32)
Calcium: 6 mg/dL — CL (ref 8.9–10.3)
Chloride: 99 mmol/L (ref 98–111)
Creatinine, Ser: 3.06 mg/dL — ABNORMAL HIGH (ref 0.44–1.00)
GFR, Estimated: 16 mL/min — ABNORMAL LOW (ref >60)
Glucose, Bld: 151 mg/dL — ABNORMAL HIGH (ref 70–99)
Potassium: 7.1 mmol/L (ref 3.5–5.1)
Sodium: 147 mmol/L — ABNORMAL HIGH (ref 135–145)
Total Bilirubin: 2.2 mg/dL — ABNORMAL HIGH (ref 0.3–1.2)
Total Protein: <3 g/dL — ABNORMAL LOW (ref 6.5–8.1)

## 2021-09-23 LAB — GLUCOSE, CAPILLARY
Glucose-Capillary: 58 mg/dL — ABNORMAL LOW (ref 70–99)
Glucose-Capillary: 77 mg/dL (ref 70–99)
Glucose-Capillary: 82 mg/dL (ref 70–99)
Glucose-Capillary: 84 mg/dL (ref 70–99)
Glucose-Capillary: 92 mg/dL (ref 70–99)

## 2021-09-23 LAB — DIC (DISSEMINATED INTRAVASCULAR COAGULATION)PANEL
D-Dimer, Quant: 3.31 ug/mL-FEU — ABNORMAL HIGH (ref 0.00–0.50)
Fibrinogen: 236 mg/dL (ref 210–475)
INR: 5.2 (ref 0.8–1.2)
Platelets: 54 10*3/uL — ABNORMAL LOW (ref 150–400)
Prothrombin Time: 47.7 seconds — ABNORMAL HIGH (ref 11.4–15.2)
Smear Review: NONE SEEN
aPTT: 53 seconds — ABNORMAL HIGH (ref 24–36)

## 2021-09-23 LAB — COOXEMETRY PANEL
Carboxyhemoglobin: 2.1 % — ABNORMAL HIGH (ref 0.5–1.5)
Methemoglobin: <0.7 % (ref 0.0–1.5)
O2 Saturation: 67.2 %
Total hemoglobin: 7.6 g/dL — ABNORMAL LOW (ref 12.0–16.0)

## 2021-09-23 LAB — MAGNESIUM: Magnesium: 3.4 mg/dL — ABNORMAL HIGH (ref 1.7–2.4)

## 2021-09-23 LAB — TRIGLYCERIDES: Triglycerides: 262 mg/dL — ABNORMAL HIGH (ref <150)

## 2021-09-23 LAB — PHOSPHORUS: Phosphorus: 14.4 mg/dL — ABNORMAL HIGH (ref 2.5–4.6)

## 2021-09-23 MED ORDER — GLYCOPYRROLATE 0.2 MG/ML IJ SOLN: 0.2000 mg | INTRAMUSCULAR | Status: DC | PRN

## 2021-09-23 MED ORDER — ACETAMINOPHEN 325 MG PO TABS: 650.0000 mg | ORAL_TABLET | Freq: Four times a day (QID) | ORAL | Status: DC | PRN

## 2021-09-23 MED ORDER — ACETAMINOPHEN 650 MG RE SUPP: 650.0000 mg | Freq: Four times a day (QID) | RECTAL | Status: DC | PRN

## 2021-09-23 MED ORDER — MIDAZOLAM HCL 2 MG/2ML IJ SOLN: 2.0000 mg | INTRAMUSCULAR | Status: DC | PRN

## 2021-09-23 MED ORDER — FENTANYL 2500MCG IN NS 250ML (10MCG/ML) PREMIX INFUSION: 0.0000 ug/h | INTRAVENOUS | Status: DC

## 2021-09-23 MED ORDER — SODIUM BICARBONATE 8.4 % IV SOLN
100.0000 meq | Freq: Once | INTRAVENOUS | Status: AC
  Administered 2021-09-23: 100 meq via INTRAVENOUS
  Filled 2021-09-23: qty 100

## 2021-09-23 MED ORDER — POLYVINYL ALCOHOL 1.4 % OP SOLN
1.0000 [drp] | Freq: Four times a day (QID) | OPHTHALMIC | Status: DC | PRN
Start: 2021-09-23 — End: 2021-09-23

## 2021-09-23 MED ORDER — PIPERACILLIN-TAZOBACTAM IN DEX 2-0.25 GM/50ML IV SOLN
2.2500 g | Freq: Three times a day (TID) | INTRAVENOUS | Status: DC
  Filled 2021-09-23: qty 50

## 2021-09-23 MED ORDER — GLYCOPYRROLATE 1 MG PO TABS: 1.0000 mg | ORAL_TABLET | ORAL | Status: DC | PRN

## 2021-09-23 MED ORDER — SODIUM BICARBONATE 8.4 % IV SOLN
INTRAVENOUS | Status: DC
  Filled 2021-09-23: qty 1000
  Filled 2021-09-23: qty 150

## 2021-09-23 MED ORDER — HALOPERIDOL LACTATE 5 MG/ML IJ SOLN: 2.5000 mg | INTRAMUSCULAR | Status: DC | PRN

## 2021-09-23 MED ORDER — DEXTROSE 50 % IV SOLN
INTRAVENOUS | Status: AC
  Administered 2021-09-23: 50 mL
  Filled 2021-09-23: qty 50

## 2021-09-23 MED ORDER — FENTANYL BOLUS VIA INFUSION: 100.0000 ug | INTRAVENOUS | Status: DC | PRN

## 2021-09-23 MED ORDER — SODIUM CHLORIDE 0.9 % IV SOLN: INTRAVENOUS | Status: DC

## 2021-09-23 MED ORDER — SODIUM CHLORIDE 0.9% IV SOLUTION: Freq: Once | INTRAVENOUS | Status: DC

## 2021-09-24 ENCOUNTER — Encounter: Payer: Self-pay | Admitting: *Deleted

## 2021-09-24 LAB — BPAM FFP
Blood Product Expiration Date: 202307102359
Blood Product Expiration Date: 202307102359
ISSUE DATE / TIME: 202307052359
ISSUE DATE / TIME: 202307060130
Unit Type and Rh: 5100
Unit Type and Rh: 5100

## 2021-09-24 LAB — SURGICAL PATHOLOGY

## 2021-09-24 LAB — PREPARE FRESH FROZEN PLASMA

## 2021-09-24 NOTE — Progress Notes (Signed)
Patient passed away in hospital. Condolence letter mailed to patient home.   Oncology Nurse Navigator Documentation     09/24/2021    1:00 PM  Oncology Nurse Navigator Flowsheets  Navigation Complete Date: 2021-09-28  Post Navigation: Continue to Follow Patient? No  Reason Not Navigating Patient: Airline pilot Encounter Type Other:  Time Spent with Patient 15

## 2021-09-26 LAB — TYPE AND SCREEN
ABO/RH(D): O POS
Antibody Screen: NEGATIVE
Unit division: 0
Unit division: 0
Unit division: 0
Unit division: 0

## 2021-09-26 LAB — BPAM RBC
Blood Product Expiration Date: 202307262359
Blood Product Expiration Date: 202307312359
Blood Product Expiration Date: 202308012359
Blood Product Expiration Date: 202308012359
ISSUE DATE / TIME: 202307050940
ISSUE DATE / TIME: 202307050940
Unit Type and Rh: 5100
Unit Type and Rh: 5100
Unit Type and Rh: 5100
Unit Type and Rh: 5100

## 2021-09-28 LAB — CULTURE, BLOOD (ROUTINE X 2): Culture: NO GROWTH

## 2021-09-29 ENCOUNTER — Encounter: Payer: BC Managed Care – PPO | Admitting: Family Medicine

## 2021-09-29 LAB — BPAM FFP
Blood Product Expiration Date: 202307112359
Blood Product Expiration Date: 202307112359
Blood Product Expiration Date: 202307112359
Unit Type and Rh: 5100
Unit Type and Rh: 5100
Unit Type and Rh: 5100

## 2021-09-29 LAB — PREPARE FRESH FROZEN PLASMA

## 2021-10-05 ENCOUNTER — Other Ambulatory Visit: Payer: BC Managed Care – PPO

## 2021-10-05 ENCOUNTER — Ambulatory Visit: Payer: BC Managed Care – PPO

## 2021-10-05 ENCOUNTER — Ambulatory Visit: Payer: BC Managed Care – PPO | Admitting: Hematology & Oncology

## 2021-10-05 ENCOUNTER — Encounter: Payer: BC Managed Care – PPO | Admitting: Family Medicine

## 2021-10-07 ENCOUNTER — Telehealth: Payer: Self-pay | Admitting: Hematology & Oncology

## 2021-10-07 ENCOUNTER — Ambulatory Visit: Payer: BC Managed Care – PPO

## 2021-10-07 NOTE — Telephone Encounter (Signed)
Caller name: Jonelle Sidle (daughter)  On DPR? :yes/no: Yes  Call back number: [phone]  Provider they see: Dr. Birdie Riddle  Reason for call: Daughter called; wanted to let Dr. Birdie Riddle know that [facility] for pt is going to be on 07/29 at [facility] in [place] at 11 am

## 2021-10-19 NOTE — Progress Notes (Signed)
[facility] Progress Note Patient Name: CZARINA GINGRAS DOB: November 29, 1955 MRN: [id number]   Date of Service  05-Oct-2021  HPI/Events of Note  ABG on 80%/PRVC 30/TV 500/P 10 = 7.19/29/91/11.1.  eICU Interventions  Plan: NaHCO3 100 meq IV now. Repeat ABG at 5 AM.     Intervention Category Major Interventions: Respiratory failure - evaluation and management;Acid-Base disturbance - evaluation and management  Shron Ozer Eugene 10-05-2021, 12:40 AM

## 2021-10-19 NOTE — Procedures (Signed)
Extubation Procedure Note  Patient Details:   Name: Christie Thomas DOB: Aug 11, 1955 MRN: [id number]   Airway Documentation:    Vent end date: 09/26/2021 Vent end time: 0834   Evaluation  O2 sats: transiently fell during during procedure Complications: No apparent complications Patient did tolerate procedure well. Bilateral Breath Sounds: Diminished, Clear   No  Ardell Isaacs 2021/09/26, 8:42 AM    Pt extubated for comfort measures RN aware

## 2021-10-19 NOTE — Plan of Care (Signed)
  Problem: Respiratory: Goal: Ability to maintain a clear airway and adequate ventilation will improve Outcome: Progressing   Problem: Education: Goal: Knowledge of General Education information will improve Description: Including pain rating scale, medication(s)/side effects and non-pharmacologic comfort measures Outcome: Not Progressing   Problem: Health Behavior/Discharge Planning: Goal: Ability to manage health-related needs will improve Outcome: Not Progressing   Problem: Nutrition: Goal: Adequate nutrition will be maintained Outcome: Not Progressing   Problem: Pain Managment: Goal: General experience of comfort will improve Outcome: Not Progressing

## 2021-10-19 NOTE — Progress Notes (Signed)
[facility] Progress Note Patient Name: Christie Thomas DOB: 1955-09-18 MRN: [id number]   Date of Service  10-17-2021  HPI/Events of Note  ABG on 80%/PRVC 30/TV 500/P10 = 7.22/24/94/10.  eICU Interventions  Plan: NaHCO3 100 meq IV now. Repeat ABG at 9 AM.     Intervention Category Major Interventions: Respiratory failure - evaluation and management;Acid-Base disturbance - evaluation and management  [name] Oct 17, 2021, 5:38 AM

## 2021-10-19 NOTE — Progress Notes (Signed)
1 Day Post-Op   Subjective/Chief Complaint: Intubated. Critically ill   Objective: Vital signs in last 24 hours: Temp:  [92.8 F (33.8 C)-100.8 F (38.2 C)] 96.3 F (35.7 C) (07/06 0700) Pulse Rate:  [91-142] 97 (07/06 0700) Resp:  [28-36] 30 (07/06 0700) BP: (34-141)/(12-81) 96/71 (07/06 0400) SpO2:  [81 %-99 %] 96 % (07/06 0700) Arterial Line BP: (90-165)/(40-74) 122/46 (07/06 0700) FiO2 (%):  [60 %-80 %] 80 % (07/06 0306) Last BM Date : 09/21/21  Intake/Output from previous day: 07/05 0701 - 07/06 0700 In: 9162.9 [I.V.:5710; Blood:1045.3; IV Piggyback:2407.7] Out: 1610 [Urine:60; Drains:850; Blood:700] Intake/Output this shift: No intake/output data recorded.  General appearance: intubated and sedated Resp: on vent Cardio: requiring maximal amounts of multiple pressors GI: vac in place  Lab Results:  Recent Labs    09/21/2021 1401 29-Sep-2021 0440  WBC 38.0* 41.4*  HGB 11.5* 7.2*  HCT 36.1 22.8*  PLT 85*  85* 54*  56*   BMET Recent Labs    09/21/2021 1401 09/29/21 0545  NA 147* 147*  K 3.9 7.1*  CL 109 99  CO2 14* 11*  GLUCOSE 161* 151*  BUN 62* 66*  CREATININE 2.28* 3.06*  CALCIUM 7.7* 6.0*   PT/INR Recent Labs    09/29/2021 1401 Sep 29, 2021 0440  LABPROT 37.6* 47.7*  INR 3.9* 5.2*   ABG Recent Labs    10/14/2021 2332 2021/09/29 0440  PHART 7.19* 7.22*  HCO3 11.1* 10.0*    Studies/Results: DG CHEST PORT 1 VIEW  Result Date: 09/30/2021 CLINICAL DATA:  Hypoxia EXAM: PORTABLE CHEST 1 VIEW COMPARISON:  10/10/2021 FINDINGS: Endotracheal tube tip at the level of the clavicular heads. Left chest wall Port-A-Cath tip at the cavoatrial junction. Unchanged bibasilar opacities. Small pleural effusions. Feeding tube side port projects the stomach. IMPRESSION: Endotracheal tube tip at the level of the clavicular heads. Electronically Signed   By: Ulyses Jarred M.D.   On: 09/24/2021 22:30   ECHOCARDIOGRAM LIMITED  Result Date: 09/21/2021    ECHOCARDIOGRAM LIMITED  REPORT   Patient Name:   Christie Thomas Date of Exam: 10/05/2021 Medical Rec #:  [id number]             Height:       64.0 in Accession #:    [id number]            Weight:       179.9 lb Date of Birth:  1955/06/05            BSA:          1.870 m Patient Age:    66 years              BP:           93/55 mmHg Patient Gender: F                     HR:           133 bpm. Exam Location:  Inpatient Procedure: Limited Echo, Limited Color Doppler and Cardiac Doppler STAT ECHO Indications:    pericardial effusion  History:        Patient has prior history of Echocardiogram examinations, most                 recent 07/31/2021. Breast cancer. narcotic bowel syndrome.; Risk                 Factors:Dyslipidemia.  Sonographer:    Johny Chess RDCS Referring Phys: [phone] Fernando Salinas  BOWSER IMPRESSIONS  1. Small anterior pericardial effusion. Right ventricle does not relax completely suggesting increased intrapericardial pressure. Unable to assess fully to tamponade - Mitral valve and Tricuspid valve variations in doppler not performed , IVC not well visualized and poor visibility does not allow assessment of ventricular interdepence.  2. Left ventricular ejection fraction, by estimation, is 70 to 75%. The left ventricle has hyperdynamic function. Conclusion(s)/Recommendation(s): Due to findings of small anterior pericardial effusion and concern for intrapericardial pressure increase , a follow up echocardiogram is recommended in 1-2 days. FINDINGS  Left Ventricle: Left ventricular ejection fraction, by estimation, is 70 to 75%. The left ventricle has hyperdynamic function. The left ventricular internal cavity size was small. There is no left ventricular hypertrophy. Pericardium: Small anterior pericardial effusion. Right ventricle does not relax completely suggesting increased intrapericardial pressure. Unable to assess fully to tamponade - Mitral valve and Tricuspid valve variations in doppler not performed , IVC not well  visualized and poor visibility does not allow assessment of ventricular interdepence. The pericardial effusion is anterior to the right ventricle. LEFT VENTRICLE PLAX 2D LVIDd:         2.70 cm LVIDs:         1.80 cm LV PW:         1.10 cm LV IVS:        0.90 cm  IVC IVC diam: 0.80 cm LEFT ATRIUM         Index LA diam:    3.00 cm 1.60 cm/m   AORTA Ao Asc diam: 2.80 cm Kardie Tobb DO Electronically signed by Berniece Salines DO Signature Date/Time: 10/09/2021/5:52:59 PM    Final    DG CHEST PORT 1 VIEW  Result Date: 10/01/2021 CLINICAL DATA:  Endotracheally intubated EXAM: PORTABLE CHEST 1 VIEW COMPARISON:  Chest radiograph 10/12/2021 FINDINGS: Endotracheal tube tip overlies the distal trachea approximately 0.9 cm above the carina. Recommend retraction by 2.0-2.5 Cm. Left neck chest port catheter tip overlies the superior cavoatrial junction. Nasogastric tube side port overlie the stomach. Unchanged cardiomediastinal silhouette. There are small bilateral pleural effusions with adjacent basilar atelectasis. Low lung volumes. No pneumothorax. Bones are unchanged. Known pneumoperitoneum. IMPRESSION: Endotracheal tube tip overlies the distal trachea approximately 0.9 cm above the carina. Recommend retraction by 2.0-2.5 cm. Small bilateral pleural effusions with adjacent basilar atelectasis. Low lung volumes. Known pneumoperitoneum. These results will be called to the ordering clinician or representative by the Radiologist Assistant, and communication documented in the PACS or [employer]. Electronically Signed   By: Maurine Simmering M.D.   On: 10/01/2021 11:25   DG Abd 1 View  Result Date: 10/07/2021 CLINICAL DATA:  NG tube placement EXAM: ABDOMEN - 1 VIEW COMPARISON:  Same day CT FINDINGS: Nasogastric tube tip and side port overlie the stomach. No evidence of bowel obstruction. Known pneumoperitoneum. IMPRESSION: Nasogastric tube tip and side port overlie the stomach. Known pneumoperitoneum. Electronically Signed   By:  Maurine Simmering M.D.   On: 09/29/2021 11:19   CT ABDOMEN PELVIS WO CONTRAST  Addendum Date: 09/19/2021   ADDENDUM REPORT: 09/19/2021 07:11 ADDENDUM: Critical Value/emergent results were called by telephone at the time of interpretation on 09/18/2021 at 0701 hours to Dr. Ander Slade, Who verbally acknowledged these results. Electronically Signed   By: Genevie Ann M.D.   On: 10/02/2021 07:11   Result Date: 10/05/2021 CLINICAL DATA:  66 year old female with acute abdominal pain. History of breast cancer, chemotherapy. EXAM: CT ABDOMEN AND PELVIS WITHOUT CONTRAST TECHNIQUE: Multidetector CT imaging of the  abdomen and pelvis was performed following the standard protocol without IV contrast. RADIATION DOSE REDUCTION: This exam was performed according to the departmental dose-optimization program which includes automated exposure control, adjustment of the mA and/or kV according to patient size and/or use of iterative reconstruction technique. COMPARISON:  Noncontrast CT Abdomen and Pelvis 09/05/2021 and earlier. FINDINGS: Lower chest: Respiratory motion. No pericardial effusion. Small new layering pleural effusions and developing bilateral lower lobe consolidation. Hepatobiliary: Absent gallbladder as before. Free air and free fluid surrounding the liver. Negative noncontrast liver parenchyma. Pancreas: Some pneumoperitoneum in the lesser sac. Negative noncontrast pancreas. Spleen: Adjacent free air and free fluid but otherwise negative. Adrenals/Urinary Tract: Exophytic simple fluid density and probably benign right upper pole renal cyst redemonstrated. Nonobstructed kidneys with no nephrolithiasis. Adrenal glands remain normal. Ureters are decompressed. Diminutive urinary bladder. Stomach/Bowel: Moderate to large volume pneumoperitoneum. Low-density free fluid in the abdomen and pelvis., And there is a relatively large 12.5 cm collection of extraluminal gas and stool suspected in the abdomen deep to the umbilicus on series 2,  image 67. Increased sigmoid colon distention with stool, wall thickening, and inflammation with underlying diverticular disease. The upstream descending colon is decompressed and appears mildly inflamed. The transverse colon is decompressed and indistinct. The right colon contains fluid and is more decompressed. The appendix now also appears enlarged and indistinct on series 2, image 71. Stomach and duodenum are fluid-filled but relatively decompressed. Fluid-filled but nondilated small bowel loops throughout the abdomen. Vascular/Lymphatic: Mild Aortoiliac calcified atherosclerosis. Normal caliber abdominal aorta. Vascular patency is not evaluated in the absence of IV contrast. No lymphadenopathy identified. Reproductive: Negative noncontrast appearance; small calcified fibroid. Other: Moderate low-density pelvic free fluid. Musculoskeletal: Stable visualized osseous structures., including mild T12 superior endplate deformity. IMPRESSION: 1. Perforated Bowel. Moderate to large volume of Free Air and small to moderate volume of free fluid in the abdomen, plus a large 12.5 cm suspected Extraluminal Collection Of Bowel Contents in the mesentery deep to the umbilicus. Progressive inflammation of all large and small bowel loops from the recent CT 09/07/2021. The sigmoid colon is most distended (with stool), but the appendix also appears abnormally dilated and indistinct now. Recommend surgery consultation. 2. New small layering pleural effusions and developing bilateral lower lobe consolidation, suspicious for Pneumonia. Electronically Signed: By: Genevie Ann M.D. On: 09/25/2021 06:56   DG CHEST PORT 1 VIEW  Result Date: 09/18/2021 CLINICAL DATA:  Increasing shortness of breath, hypoxia, abdominal distention. EXAM: PORTABLE CHEST 1 VIEW, ABDOMEN PORTABLE ONE VIEW COMPARISON:  08/01/2021, 09/17/2021. FINDINGS: The heart size and mediastinal contours are within normal limits. Lung volumes are extremely low with patchy  airspace disease at the lung bases. No effusion or pneumothorax. A stable left chest port is noted. Surgical clips are present in the right chest wall. No acute osseous abnormality. Nonobstructive bowel-gas pattern. Surgical clips are noted in the right upper quadrant. No unusual calcification. IMPRESSION: 1. Low lung volumes with patchy atelectasis or infiltrate at the lung bases. 2. Nonobstructive bowel-gas pattern. Electronically Signed   By: Brett Fairy M.D.   On: 10/09/2021 04:19   DG Abd Portable 1V  Result Date: 09/21/2021 CLINICAL DATA:  Increasing shortness of breath, hypoxia, abdominal distention. EXAM: PORTABLE CHEST 1 VIEW, ABDOMEN PORTABLE ONE VIEW COMPARISON:  08/01/2021, 09/17/2021. FINDINGS: The heart size and mediastinal contours are within normal limits. Lung volumes are extremely low with patchy airspace disease at the lung bases. No effusion or pneumothorax. A stable left chest port is noted. Surgical  clips are present in the right chest wall. No acute osseous abnormality. Nonobstructive bowel-gas pattern. Surgical clips are noted in the right upper quadrant. No unusual calcification. IMPRESSION: 1. Low lung volumes with patchy atelectasis or infiltrate at the lung bases. 2. Nonobstructive bowel-gas pattern. Electronically Signed   By: Brett Fairy M.D.   On: 10/09/2021 04:19    Anti-infectives: Anti-infectives (From admission, onward)    Start     Dose/Rate Route Frequency Ordered Stop   September 26, 2021 1400  piperacillin-tazobactam (ZOSYN) IVPB 2.25 g        2.25 g 100 mL/hr over 30 Minutes Intravenous Every 8 hours 09/26/2021 0719     09/27/2021 1200  fluconazole (DIFLUCAN) IVPB 200 mg        200 mg 100 mL/hr over 60 Minutes Intravenous Every 24 hours 10/16/2021 1024     09/16/21 1000  fluconazole (DIFLUCAN) tablet 100 mg  Status:  Discontinued        100 mg Oral Daily 09/16/21 0708 10/13/2021 1024   09/10/2021 1400  piperacillin-tazobactam (ZOSYN) IVPB 3.375 g  Status:  Discontinued         3.375 g 12.5 mL/hr over 240 Minutes Intravenous Every 8 hours 08/21/2021 1339 09/26/21 0719   08/21/2021 1330  piperacillin-tazobactam (ZOSYN) IVPB 3.375 g  Status:  Discontinued        3.375 g 100 mL/hr over 30 Minutes Intravenous Every 6 hours 09/08/2021 1325 09/12/2021 1339   09/11/2021 1215  Ampicillin-Sulbactam (UNASYN) 3 g in sodium chloride 0.9 % 100 mL IVPB  Status:  Discontinued        3 g 200 mL/hr over 30 Minutes Intravenous  Once 08/20/2021 1209 08/22/2021 1325       Assessment/Plan: s/p Procedure(s) with comments: EXPLORATORY LAPAROTOMY (N/A) - EXPLORATORY LAPAROTOMY, LEFT COLECTOMY WITH MOBILIZATION SPLENIC FLEXURE, ABDOMINAL VACUUM DRESSING The patient is experiencing progressive multiorgan failure despite surgery and aggressive resuscitation. I feel the prognosis at this point is grim and she will not recover. We have discussed this with the family and they would like to withdraw care and let her pass. We will focus on comfort measures and doing what we can for the family  LOS: 8 days    Autumn Messing III September 26, 2021

## 2021-10-19 NOTE — Progress Notes (Signed)
  Interdisciplinary Goals of Care Family Meeting   Date carried out:: 2021/09/25  Location of Christie meeting: Bedside  Member's involved: Physician, Bedside Registered Nurse, and Family Member or next of kin  Durable Power of Attorney or acting medical decision maker: Pt's husband Christie Thomas   Discussion: We discussed goals of care for Christie Thomas. Overnight she was felt to have pressor requirement to high to safely start CRRT. While her pressor requirement has improved and CRRT could be started, I told Mr. Tuley that she would almost certainly still die here from multisystem organ failure. He will ask his daughter to come in to see her and when they're ready we will transition to comfort measures only.  Code status: Limited Code or DNR with short term, transition to comfort measures when family ready  Disposition: In-patient comfort care   Time spent for Christie meeting: 15 minutes  Maryjane Hurter 09/25/2021, 7:38 AM

## 2021-10-19 NOTE — Progress Notes (Signed)
[facility] Progress Note Patient Name: Christie Thomas DOB: 02/20/1956 MRN: [id number]   Date of Service  Oct 07, 2021  HPI/Events of Note  Multiple issues: 1. INR = 5.2 and 3. Triglycerides = 262. Patient is on a Propofol IV infusion.   eICU Interventions  Plan: Transfuse 3 units FFP. Repeat PT/INR at 12 noon. D/C Propofol IV infusion.  Titrate Fentanyl IV infusion for sedation.      Intervention Category Major Interventions: Other:  Mkenzie Dotts Cornelia Copa 2021/10/07, 6:52 AM

## 2021-10-19 NOTE — Telephone Encounter (Signed)
Flowers ordered for patients family per Dr Marin Olp request

## 2021-10-19 NOTE — Discharge Summary (Signed)
DEATH SUMMARY   Patient Details  Name: Christie Thomas MRN: [id number] DOB: [birth year]  Admission/Discharge Information   Admit Date:  09/28/2021  Date of Death: Date of Death: Oct 06, 2021  Time of Death: Time of Death: 0835  Length of Stay: 8  Referring Physician: Volanda Napoleon, MD   Reason(s) for Hospitalization  Constipation Septic shock due to feculent peritonitis in setting of perforated left colon s/p L colectomy left in discontinuity Multisystem organ failure  Diagnoses  Preliminary cause of death:  Septic shock due to feculent peritonitis in setting of perforated left colon Secondary Diagnoses (including complications and co-morbidities):  Principal Problem:   Abdominal pain Active Problems:   Stercoral colitis   Narcotic bowel syndrome ([place])   Breast cancer of upper-inner quadrant of right female breast ([place])   Neutropenia ([place])   Constipation   Slow transit constipation   Leukocytosis   Hypokalemia   Anemia of chronic disease   Acute respiratory failure with hypoxia [facility])   Brief Hospital Course (including significant findings, care, treatment, and services provided and events leading to death)  Christie Thomas is a 66 y.o. year old female who was admitted with abdominal pain in setting of constipation found to have stercoral colitis with course c/b Septic shock due to feculent peritonitis in setting of perforated left colon s/p L colectomy left in discontinuity on 7/5, acute renal failure, liver failure, acute hypoxic hypercapnic respiratory failure, acute metabolic encephalopathy. Discussed her deterioration with pt's husband and daughter and decision made to pursue comfort measures only and she died on 2022-10-07.    Pertinent Labs and Studies  Significant Diagnostic Studies DG CHEST PORT 1 VIEW  Result Date: 10/12/2021 CLINICAL DATA:  Hypoxia EXAM: PORTABLE CHEST 1 VIEW COMPARISON:  10/05/2021 FINDINGS: Endotracheal tube tip at the level of the  clavicular heads. Left chest wall Port-A-Cath tip at the cavoatrial junction. Unchanged bibasilar opacities. Small pleural effusions. Feeding tube side port projects the stomach. IMPRESSION: Endotracheal tube tip at the level of the clavicular heads. Electronically Signed   By: Ulyses Jarred M.D.   On: 10/10/2021 22:30   ECHOCARDIOGRAM LIMITED  Result Date: 09/24/2021    ECHOCARDIOGRAM LIMITED REPORT   Patient Name:   Christie Thomas Date of Exam: 09/20/2021 Medical Rec #:  [id number]             Height:       64.0 in Accession #:    [id number]            Weight:       179.9 lb Date of Birth:  Sep 11, 1955            BSA:          1.870 m Patient Age:    78 years              BP:           93/55 mmHg Patient Gender: F                     HR:           133 bpm. Exam Location:  Inpatient Procedure: Limited Echo, Limited Color Doppler and Cardiac Doppler STAT ECHO Indications:    pericardial effusion  History:        Patient has prior history of Echocardiogram examinations, most                 recent 07/31/2021. Breast cancer. narcotic bowel syndrome.; Risk  Factors:Dyslipidemia.  Sonographer:    Johny Chess RDCS Referring Phys: [phone] [place]  1. Small anterior pericardial effusion. Right ventricle does not relax completely suggesting increased intrapericardial pressure. Unable to assess fully to tamponade - Mitral valve and Tricuspid valve variations in doppler not performed , IVC not well visualized and poor visibility does not allow assessment of ventricular interdepence.  2. Left ventricular ejection fraction, by estimation, is 70 to 75%. The left ventricle has hyperdynamic function. Conclusion(s)/Recommendation(s): Due to findings of small anterior pericardial effusion and concern for intrapericardial pressure increase , a follow up echocardiogram is recommended in 1-2 days. FINDINGS  Left Ventricle: Left ventricular ejection fraction, by estimation, is 70 to 75%. The  left ventricle has hyperdynamic function. The left ventricular internal cavity size was small. There is no left ventricular hypertrophy. Pericardium: Small anterior pericardial effusion. Right ventricle does not relax completely suggesting increased intrapericardial pressure. Unable to assess fully to tamponade - Mitral valve and Tricuspid valve variations in doppler not performed , IVC not well visualized and poor visibility does not allow assessment of ventricular interdepence. The pericardial effusion is anterior to the right ventricle. LEFT VENTRICLE PLAX 2D LVIDd:         2.70 cm LVIDs:         1.80 cm LV PW:         1.10 cm LV IVS:        0.90 cm  IVC IVC diam: 0.80 cm LEFT ATRIUM         Index LA diam:    3.00 cm 1.60 cm/m   AORTA Ao Asc diam: 2.80 cm Kardie Tobb DO Electronically signed by Berniece Salines DO Signature Date/Time: 10/10/2021/5:52:59 PM    Final    DG CHEST PORT 1 VIEW  Result Date: 10/05/2021 CLINICAL DATA:  Endotracheally intubated EXAM: PORTABLE CHEST 1 VIEW COMPARISON:  Chest radiograph 10/15/2021 FINDINGS: Endotracheal tube tip overlies the distal trachea approximately 0.9 cm above the carina. Recommend retraction by 2.0-2.5 Cm. Left neck chest port catheter tip overlies the superior cavoatrial junction. Nasogastric tube side port overlie the stomach. Unchanged cardiomediastinal silhouette. There are small bilateral pleural effusions with adjacent basilar atelectasis. Low lung volumes. No pneumothorax. Bones are unchanged. Known pneumoperitoneum. IMPRESSION: Endotracheal tube tip overlies the distal trachea approximately 0.9 cm above the carina. Recommend retraction by 2.0-2.5 cm. Small bilateral pleural effusions with adjacent basilar atelectasis. Low lung volumes. Known pneumoperitoneum. These results will be called to the ordering clinician or representative by the Radiologist Assistant, and communication documented in the PACS or [employer]. Electronically Signed   By: Maurine Simmering  M.D.   On: 10/09/2021 11:25   DG Abd 1 View  Result Date: 10/17/2021 CLINICAL DATA:  NG tube placement EXAM: ABDOMEN - 1 VIEW COMPARISON:  Same day CT FINDINGS: Nasogastric tube tip and side port overlie the stomach. No evidence of bowel obstruction. Known pneumoperitoneum. IMPRESSION: Nasogastric tube tip and side port overlie the stomach. Known pneumoperitoneum. Electronically Signed   By: Maurine Simmering M.D.   On: 10/16/2021 11:19   CT ABDOMEN PELVIS WO CONTRAST  Addendum Date: 10/09/2021   ADDENDUM REPORT: 10/01/2021 07:11 ADDENDUM: Critical Value/emergent results were called by telephone at the time of interpretation on 10/12/2021 at 0701 hours to Dr. Ander Slade, Who verbally acknowledged these results. Electronically Signed   By: Genevie Ann M.D.   On: 09/21/2021 07:11   Result Date: 09/20/2021 CLINICAL DATA:  66 year old female with acute abdominal pain. History of breast cancer,  chemotherapy. EXAM: CT ABDOMEN AND PELVIS WITHOUT CONTRAST TECHNIQUE: Multidetector CT imaging of the abdomen and pelvis was performed following the standard protocol without IV contrast. RADIATION DOSE REDUCTION: This exam was performed according to the departmental dose-optimization program which includes automated exposure control, adjustment of the mA and/or kV according to patient size and/or use of iterative reconstruction technique. COMPARISON:  Noncontrast CT Abdomen and Pelvis 09/12/2021 and earlier. FINDINGS: Lower chest: Respiratory motion. No pericardial effusion. Small new layering pleural effusions and developing bilateral lower lobe consolidation. Hepatobiliary: Absent gallbladder as before. Free air and free fluid surrounding the liver. Negative noncontrast liver parenchyma. Pancreas: Some pneumoperitoneum in the lesser sac. Negative noncontrast pancreas. Spleen: Adjacent free air and free fluid but otherwise negative. Adrenals/Urinary Tract: Exophytic simple fluid density and probably benign right upper pole renal cyst  redemonstrated. Nonobstructed kidneys with no nephrolithiasis. Adrenal glands remain normal. Ureters are decompressed. Diminutive urinary bladder. Stomach/Bowel: Moderate to large volume pneumoperitoneum. Low-density free fluid in the abdomen and pelvis., And there is a relatively large 12.5 cm collection of extraluminal gas and stool suspected in the abdomen deep to the umbilicus on series 2, image 67. Increased sigmoid colon distention with stool, wall thickening, and inflammation with underlying diverticular disease. The upstream descending colon is decompressed and appears mildly inflamed. The transverse colon is decompressed and indistinct. The right colon contains fluid and is more decompressed. The appendix now also appears enlarged and indistinct on series 2, image 71. Stomach and duodenum are fluid-filled but relatively decompressed. Fluid-filled but nondilated small bowel loops throughout the abdomen. Vascular/Lymphatic: Mild Aortoiliac calcified atherosclerosis. Normal caliber abdominal aorta. Vascular patency is not evaluated in the absence of IV contrast. No lymphadenopathy identified. Reproductive: Negative noncontrast appearance; small calcified fibroid. Other: Moderate low-density pelvic free fluid. Musculoskeletal: Stable visualized osseous structures., including mild T12 superior endplate deformity. IMPRESSION: 1. Perforated Bowel. Moderate to large volume of Free Air and small to moderate volume of free fluid in the abdomen, plus a large 12.5 cm suspected Extraluminal Collection Of Bowel Contents in the mesentery deep to the umbilicus. Progressive inflammation of all large and small bowel loops from the recent CT 09/06/2021. The sigmoid colon is most distended (with stool), but the appendix also appears abnormally dilated and indistinct now. Recommend surgery consultation. 2. New small layering pleural effusions and developing bilateral lower lobe consolidation, suspicious for Pneumonia.  Electronically Signed: By: Genevie Ann M.D. On: 09/28/2021 06:56   DG CHEST PORT 1 VIEW  Result Date: 10/15/2021 CLINICAL DATA:  Increasing shortness of breath, hypoxia, abdominal distention. EXAM: PORTABLE CHEST 1 VIEW, ABDOMEN PORTABLE ONE VIEW COMPARISON:  08/01/2021, 09/17/2021. FINDINGS: The heart size and mediastinal contours are within normal limits. Lung volumes are extremely low with patchy airspace disease at the lung bases. No effusion or pneumothorax. A stable left chest port is noted. Surgical clips are present in the right chest wall. No acute osseous abnormality. Nonobstructive bowel-gas pattern. Surgical clips are noted in the right upper quadrant. No unusual calcification. IMPRESSION: 1. Low lung volumes with patchy atelectasis or infiltrate at the lung bases. 2. Nonobstructive bowel-gas pattern. Electronically Signed   By: Brett Fairy M.D.   On: 09/27/2021 04:19   DG Abd Portable 1V  Result Date: 10/10/2021 CLINICAL DATA:  Increasing shortness of breath, hypoxia, abdominal distention. EXAM: PORTABLE CHEST 1 VIEW, ABDOMEN PORTABLE ONE VIEW COMPARISON:  08/01/2021, 09/17/2021. FINDINGS: The heart size and mediastinal contours are within normal limits. Lung volumes are extremely low with patchy airspace disease at the  lung bases. No effusion or pneumothorax. A stable left chest port is noted. Surgical clips are present in the right chest wall. No acute osseous abnormality. Nonobstructive bowel-gas pattern. Surgical clips are noted in the right upper quadrant. No unusual calcification. IMPRESSION: 1. Low lung volumes with patchy atelectasis or infiltrate at the lung bases. 2. Nonobstructive bowel-gas pattern. Electronically Signed   By: Brett Fairy M.D.   On: 09/30/2021 04:19   DG Abd 1 View  Result Date: 09/17/2021 CLINICAL DATA:  Abdominal distension EXAM: ABDOMEN - 1 VIEW COMPARISON:  None Available. FINDINGS: No dilated loops of small bowel. Stool burden is mild. No radiopaque calculi.  Cholecystectomy clips. IMPRESSION: Unremarkable bowel gas pattern. Electronically Signed   By: Macy Mis M.D.   On: 09/17/2021 08:18   CT ABDOMEN PELVIS WO CONTRAST  Result Date: 09/01/2021 CLINICAL DATA:  Abdominal pain with nausea vomiting and diarrhea in a 66 year old female. History of chemotherapy for breast cancer. * Tracking Code: BO * EXAM: CT ABDOMEN AND PELVIS WITHOUT CONTRAST TECHNIQUE: Multidetector CT imaging of the abdomen and pelvis was performed following the standard protocol without IV contrast. RADIATION DOSE REDUCTION: This exam was performed according to the departmental dose-optimization program which includes automated exposure control, adjustment of the mA and/or kV according to patient size and/or use of iterative reconstruction technique. COMPARISON:  Aug 01, 2021. FINDINGS: Lower chest: Central venous access device terminates in the caval to atrial junction. Mild basilar atelectasis. Bilateral breast implants. No pericardial effusion. Hepatobiliary: Liver displays a cirrhotic morphology with fissural widening and LEFT lobe hypertrophy. No visible lesion on noncontrast imaging. Post cholecystectomy without gross biliary duct distension. Pancreas: Normal pancreatic contour without signs of inflammation. Spleen: Normal. Adrenals/Urinary Tract: Adrenal glands are normal. Smooth renal contours. Large RIGHT upper pole renal cyst is unchanged and no dedicated imaging follow-up is recommended for this finding or the area of low attenuation in the anterior upper pole LEFT kidney that measures approximately a cm. Urinary bladder is unremarkable. Stomach/Bowel: Moderate distension of the sigmoid filled with formed stool, caliber approaching 6 cm. Adjacent inflammation. Scattered diverticulosis. Upstream colon is moderately dilated filled with liquid stool and shows pericolonic stranding with loss of haustration. No signs of stranding adjacent to the stomach. No small bowel dilation. Appendix  is normal. Vascular/Lymphatic: Aortic atherosclerosis. No sign of aneurysm. Smooth contour of the IVC. There is no gastrohepatic or hepatoduodenal ligament lymphadenopathy. No retroperitoneal or mesenteric lymphadenopathy. No pelvic sidewall lymphadenopathy. Small retroperitoneal lymph nodes less than a cm scattered throughout the retroperitoneum without change. Also vascular assessment limited by lack of intravenous contrast. Reproductive: Unremarkable by CT aside from calcified fibroid in the posterior uterus. Other: No ascites. Stranding about the descending colon. No free air. Musculoskeletal: No acute bone finding. No destructive bone process. Spinal degenerative changes. IMPRESSION: 1. Formed stool in the sigmoid and descending colon is slightly increased and remains associated with moderate colonic distension and pericolonic stranding in the setting of diverticular changes. There is now diffuse colitis with ahaustral appearance of the more proximal colon which is moderately distended suggesting developing obstruction. Findings may represent diverticulitis or stercoral colitis with formed stool in the distal colon leading to upstream obstruction and diffuse colitis. Would correlate with any signs of C diff colitis as well as with lactate and with any risk factors for colitis related to ongoing therapy. 2. GI and or surgical consultation may be helpful with close follow-up suggested due to presence of diffuse colitis. 3. Liver displays a cirrhotic morphology with  fissural widening and LEFT lobe hypertrophy. 4. Post cholecystectomy without gross biliary duct distension. 5. Aortic atherosclerosis. Aortic Atherosclerosis (ICD10-I70.0). Electronically Signed   By: Zetta Bills M.D.   On: 09/16/2021 10:02    Microbiology Recent Results (from the past 240 hour(s))  MRSA Next Gen by PCR, Nasal     Status: None   Collection Time: 10/15/2021  7:09 AM   Specimen: Nasal Mucosa; Nasal Swab  Result Value Ref Range  Status   MRSA by PCR Next Gen NOT DETECTED NOT DETECTED Final    Comment: (NOTE) The GeneXpert MRSA Assay (FDA approved for NASAL specimens only), is one component of a comprehensive MRSA colonization surveillance program. It is not intended to diagnose MRSA infection nor to guide or monitor treatment for MRSA infections. Test performance is not FDA approved in patients less than 55 years old. Performed at [facility], [place] [address], [place], [place] [address]   Urine Culture     Status: None   Collection Time: 10/17/2021  3:17 PM   Specimen: Urine, Catheterized  Result Value Ref Range Status   Specimen Description   Final    URINE, CATHETERIZED Performed at [facility] [address], [place], [place] [address]    Special Requests   Final    Immunocompromised Performed at [facility], [place] [address] [place], [place] [address]    Culture   Final    NO GROWTH Performed at [facility], [place] [address] [place], [place] [address]    Report Status 2021/10/20 FINAL  Final  Culture, blood (Routine X 2) w Reflex to ID Panel     Status: None (Preliminary result)   Collection Time: 2021-10-20  2:38 AM   Specimen: BLOOD LEFT HAND  Result Value Ref Range Status   Specimen Description   Final    BLOOD LEFT HAND Performed at [facility] [address] [place], [place] [address]    Special Requests   Final    BOTTLES DRAWN AEROBIC ONLY Blood Culture results may not be optimal due to an inadequate volume of blood received in culture bottles Performed at [facility] [address], [place], [place] [address]    Culture   Final    NO GROWTH 1 DAY Performed at [facility], [place] [address], [place], [place] [address]    Report Status PENDING  Incomplete    Lab Basic Metabolic Panel: Recent Labs  Lab 09/20/21 0335 09/21/21 0700 10/13/2021 0253 09/21/2021 0850 10/09/2021 0943  09/26/2021 1401 Oct 20, 2021 0545  NA 143 145 149* 147* 147* 147* 147*  K 3.6 3.5 4.1 3.9 4.1 3.9 7.1*  CL 110 113* 116*  --   --  109 99  CO2 24 24 15*  --   --  14* 11*  GLUCOSE 114* 116* 103*  --   --  161* 151*  BUN 41* 32* 61*  --   --  62* 66*  CREATININE 0.57 0.54 1.91*  --   --  2.28* 3.06*  CALCIUM 8.7* 8.6* 8.9  --   --  7.7* 6.0*  MG 2.6* 2.2 3.1*  --   --   --  3.4*  PHOS  --   --   --   --   --   --  14.4*   Liver Function Tests: Recent Labs  Lab 09/20/21 0335 09/21/21 0700 10/02/2021 0253 09/24/2021 1401 Oct 20, 2021 0545  AST 16 25  123* 3,570* >10,000*  ALT 17 20 72* 1,547* 7,648*  ALKPHOS 30* 61 73 134* 453*  BILITOT 1.1 1.3* 1.2 2.0* 2.2*  PROT 5.9* 5.7* 5.1* 3.7* <3.0*  ALBUMIN 2.7* 2.5* 2.3* 2.2* <1.5*   No results for input(s): "LIPASE", "AMYLASE" in the last 168 hours. No results for input(s): "AMMONIA" in the last 168 hours. CBC: Recent Labs  Lab 09/20/21 0335 09/21/21 0700 09/21/21 1336 09/18/2021 0253 09/30/2021 0850 10/10/2021 0943 09/28/2021 1401 October 23, 2021 0440  WBC 0.7* 9.5 14.5* 34.1*  --   --  38.0* 41.4*  NEUTROABS 0.2* 7.2 8.8* 25.9*  --   --   --  34.3*  HGB 9.7* 11.6* 13.3 14.0 11.6* 8.2* 11.5* 7.2*  HCT 30.3* 36.5 41.8 44.9 34.0* 24.0* 36.1 22.8*  MCV 95.0 95.1 94.6 96.8  --   --  95.5 100.9*  PLT 159 164 222 165  --   --  85*  85* 54*  56*   Cardiac Enzymes: No results for input(s): "CKTOTAL", "CKMB", "CKMBINDEX", "TROPONINI" in the last 168 hours. Sepsis Labs: Recent Labs  Lab 09/21/21 1336 10/07/2021 0253 09/29/2021 0410 10/03/2021 1401 10/18/2021 1750 Oct 23, 2021 0440  WBC 14.5* 34.1*  --  38.0*  --  41.4*  LATICACIDVEN  --   --  >9.0* >9.0* >9.0*  --     Procedures/Operations  7/5 endotracheal intubation 7/5 central line, arterial line, HD catheter insertion 7/5 ex lap, left colectomy   Maryjane Hurter 09/24/2021, 12:33 PM

## 2021-10-19 NOTE — Progress Notes (Signed)
Chaplain provided grief support to [place] husband and daughter after she had passed.  Chaplain provided them with patient placement card, since they had not yet made any arrangements.    [address], [place] Pager, [phone]

## 2021-10-19 NOTE — Progress Notes (Signed)
[facility] Progress Note Patient Name: Christie Thomas DOB: 1955/11/30 MRN: [id number]   Date of Service  September 26, 2021  HPI/Events of Note  Hypoglycemia - Blood glucose = 58.   eICU Interventions  Plan: Change NaHCO3 IV infuson at 150 mL/hour to D5 NaHCO3 IV infusion at 150 mL/hour.  Decrease D10W IV infusion to 25 mL/hour.      Intervention Category Major Interventions: Other:  Danashia Landers Cornelia Copa 2021-09-26, 4:11 AM

## 2021-10-19 NOTE — Progress Notes (Signed)
Pt was too unstable to initiate CVVHD yesterday and has continued to deteriorate.  Decision was made to transition to comfort care.  Nothing further to add and will sign off.

## 2021-10-19 NOTE — Progress Notes (Signed)
Verifying that all chemotherapy orders have been discontinued.

## 2021-10-19 NOTE — Progress Notes (Signed)
Yesterday was an incredibly rough day for Ms. Douds.  She was found to have a colonic perforation.  She became septic quickly.  She required emergency surgery.  I am absolutely amazed as to the incredible skill of surgery to get her through such a complicated issue.  She clearly is not out of the woods yet.  She is still on pressor support.  She is still acidotic.  She has marked elevation of hepatic function.  I am sure that everything was driven by profound hypotension.  Cultures are still pending.  She has multiple central lines in.   She is not making much in the way urine.  I am not surprised because of the pressors that she is on.  Her white cell count is 21,000.  Hemoglobin 7.2 platelet count 54,000.  Again I am sure the sepsis is the major indicator for the thrombocytopenia.  It would not surprise me if her platelet count dropping further.  With her having the hepatic dysfunction, it would not surprise me her platelet count dropped.  I do not know if she needs to be transfused.  I will defer to the critical care team.  It will be very interesting to see what cultures are positive.  I have to believe she will have positive blood cultures.  She is not yet on dialysis.  This certainly might be necessary depending on her renal function and her urine output.  Again, I suspect that profound hypotension is the major etiology for a lot of her organ dysfunction.  I know that the ICU team are doing an incredible job.  This is incredibly complicated.  She still has a long long way to go.  At least, her blood pressure is better.  It may take several days or maybe weeks before we start to see organs start to improve and begin to function properly.  Again, I appreciate the incredible efforts from the critical care team.  They are doing, as always, and a fantastic job.  Lattie Haw, MD  Darlyn Chamber 29:11

## 2021-10-19 NOTE — Progress Notes (Signed)
NAME:  Christie Thomas, MRN:  [id number], DOB:  1956-03-21, LOS: 8 ADMISSION DATE:  08/29/2021, CONSULTATION DATE:  10/01/2021 REFERRING MD:  [place] Crane - [facility], CHIEF COMPLAINT:  AMS    History of Present Illness:  66 yo F PMH breast ca s/p lumpectomy and currently undergoing chemo (taxotere/cytoxan) who was admitted to [facility] 6/28 with abdominal pain. Abd pain had progressively worsened in the days leading up to admission. Associated constipation. CT a/p 6/28 without evidence of perf -- did show diffuse colitis, colonic distension.     During admission, was tx for colitis, had manual massage for constipation and was able to have a bowel movement. Continued to have abd pain. 7/4 night she unfortunately declined. She was more encephalopathic. Labs revealed a leukocytosis 34, serum bicarb 15, LA >9, as well as a new Aki and new acute liver injury.  She was sent for repeat ct a/p which unfortunately showed pneumoperitoneum.   She was transferred to the ICU with concern for unprotected airway in setting of worsening encephalopathy, and [facility] was also consulted.   Pertinent  Medical History  Breast cancer   Significant Hospital Events: Including procedures, antibiotic start and stop dates in addition to other pertinent events   6/28 admit to [facility] with abd pain. CT without perf. Did have diffuse colitis, colonic distension  7/1 neutropenic WBC 3.2 7/2, 7/3 neutropenic WBC 0.6, 0.7  7/4 WBC 9.5 --> 14.5. Started acting more altered and confused. Then overnight decompensation 7/5 Bowel perf on CT. To ICU. Intubated. CVC, art line placed. Started on pressors. Being posted emergently to OR. Code status updated to no Compressions no CV  Interim History / Subjective:   CRRT not started overnight for fear of hemodynamic decompensation when starting circuit given her very high pressor requirement. Worsening liver failure, not waking up without sedation, higher O2 requirement. Discussed with husband - see IPAL note  from today.  Objective   Blood pressure 96/71, pulse 97, temperature (!) 96.3 F (35.7 C), resp. rate (!) 30, height '5\' 4"'$  (1.626 m), weight 81.6 kg, SpO2 96 %. CVP:  [4 mmHg-13 mmHg] 8 mmHg  Vent Mode: PRVC FiO2 (%):  [60 %-80 %] 80 % Set Rate:  [28 bmp-34 bmp] 30 bmp Vt Set:  [430 mL-500 mL] 500 mL PEEP:  [5 cmH20-10 cmH20] 10 cmH20 Plateau Pressure:  [18 cmH20-29 cmH20] 29 cmH20   Intake/Output Summary (Last 24 hours) at 10-04-2021 0835 Last data filed at 10-04-2021 0500 Gross per 24 hour  Intake 9162.93 ml  Output 1610 ml  Net 7552.93 ml   Filed Weights   08/20/2021 0728  Weight: 81.6 kg    Examination: General: intubated Lungs: mech breath sounds, equal chest rise Cardiovascular: tachy RR, no murmur Abdomen: abthera in place Extremities: Cool extremities. No edema, no clubbing  Skin: early mottling of lower extremities  Neuro: does not follow commands  Resolved Hospital Problem list     Assessment & Plan:   # Septic shock due to feculent peritonitis # Perforated left colon s/p L colectomy in discontinuity # Multisystem organ failure due to above   P:  - transition to comfort measures only, fentanyl gtt and pushes, prn versed   Best Practice (right click and "Reselect all SmartList Selections" daily)   Diet/type: NPO DVT prophylaxis: SCD GI prophylaxis: PPI Lines: Central line and Arterial Line Foley:  N/A Code Status:  limited Last date of multidisciplinary goals of care discussion [7/6, transition to comfort measures - see IPAL note from  today]  Critical care time: 34 minutes     CRITICAL CARE Performed by: Maryjane Hurter   Total critical care time: 34 minutes  Critical care time was exclusive of separately billable procedures and treating other patients. Critical care was necessary to treat or prevent imminent or life-threatening deterioration.  Critical care was time spent personally by me on the following activities: development of treatment  plan with patient and/or surrogate as well as nursing, discussions with consultants, evaluation of patient's response to treatment, examination of patient, obtaining history from patient or surrogate, ordering and performing treatments and interventions, ordering and review of laboratory studies, ordering and review of radiographic studies, pulse oximetry and re-evaluation of patient's condition.  [place] for pager  2021/10/14, 8:35 AM

## 2021-10-19 DEATH — deceased

## 2021-12-13 ENCOUNTER — Ambulatory Visit: Payer: BC Managed Care – PPO

## 2021-12-14 ENCOUNTER — Ambulatory Visit: Payer: BC Managed Care – PPO | Admitting: Physician Assistant

## 2021-12-17 ENCOUNTER — Ambulatory Visit: Payer: BC Managed Care – PPO | Admitting: Physician Assistant

## 2022-05-21 IMAGING — MR MR BREAST BILAT WO/W CM
8 of 13 series · 29 of 48 positions shown · IV contrast (9 GADAVIST)
Comparison: 02/03/2021 and earlier

CLINICAL DATA: Implants which are 30+ years old. Biopsy performed
of RIGHT breast 2 o'clock location on 02/03/2021, demonstrating
grade 1-2 invasive mammary carcinoma with mammary carcinoma in situ
and marked with a ribbon shaped clip.

EXAM:
BILATERAL BREAST MRI WITH AND WITHOUT CONTRAST
TECHNIQUE: Multiplanar, multisequence MR images of both breasts were obtained
prior to and following the intravenous administration of 9 ml of
Gadavist

[Series 2: t2_tirm_tra ipat (a-p) · axial · 3.0mm · 0.70mm/px · 1 of 55 slices shown]
[im 1/55]
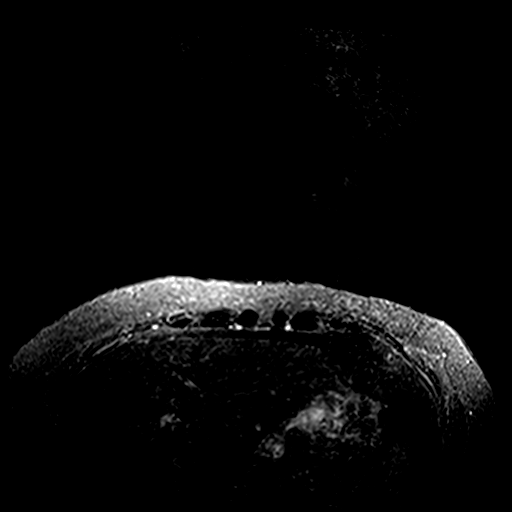

[Series 4: fl3d pre-cm no · axial · non-contrast · 1.2mm · 0.94mm/px · z∈[-94,+77]mm · 5 of 144 slices shown]
[im 1/144]
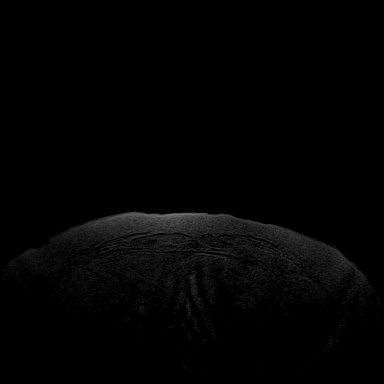
[im 36/144]
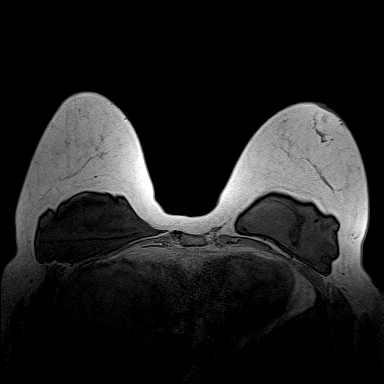
[im 72/144]
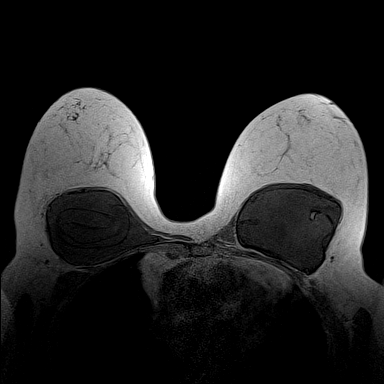
[im 108/144]
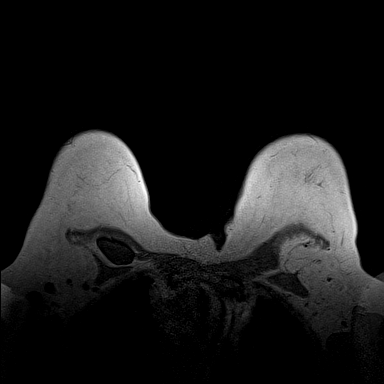
[im 144/144]
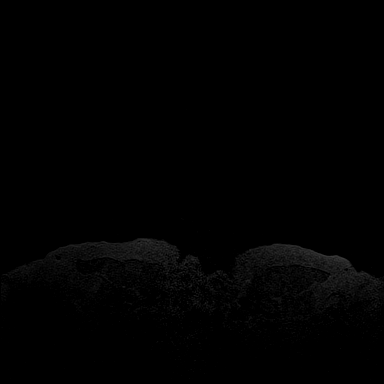

[Series 5: fl3d pre-cm · axial · non-contrast · 1.2mm · 0.94mm/px · z∈[-94,+77]mm · 5 of 144 slices shown]
[im 1/144]
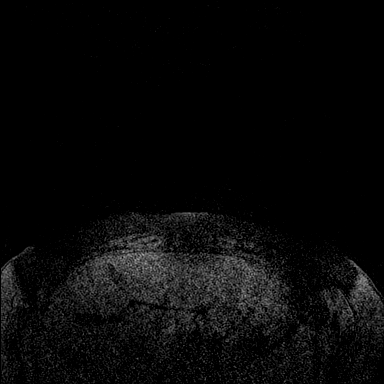
[im 36/144]
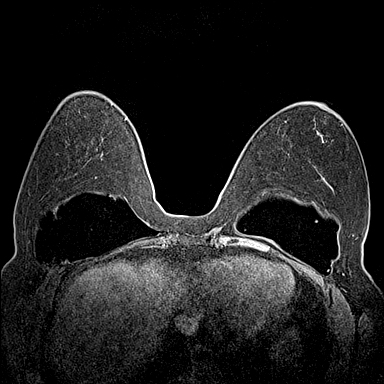
[im 72/144]
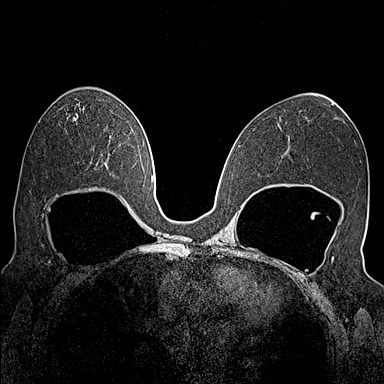
[im 108/144]
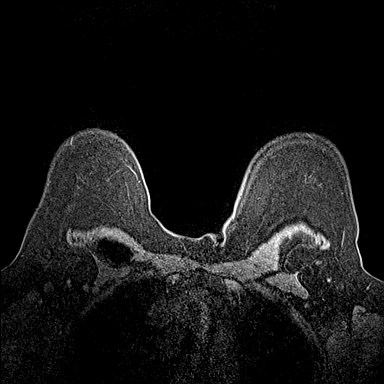
[im 144/144]
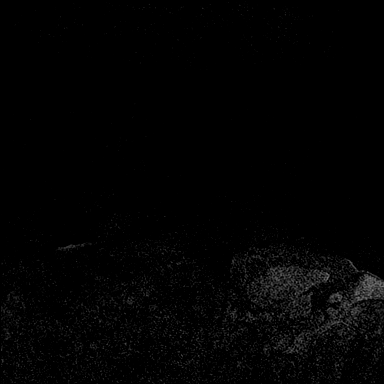

[Series 6: fl3d post-cm 20 · axial · 1.2mm · 0.94mm/px · z∈[-94,+77]mm · 5 of 144 slices shown (1 of 3)]
[im 1/144]
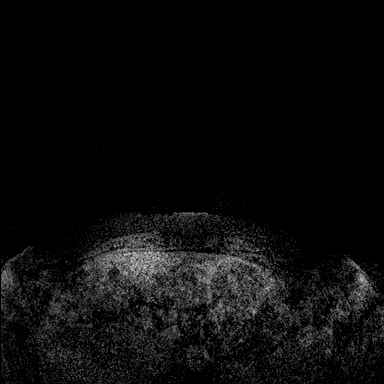
[im 36/144]
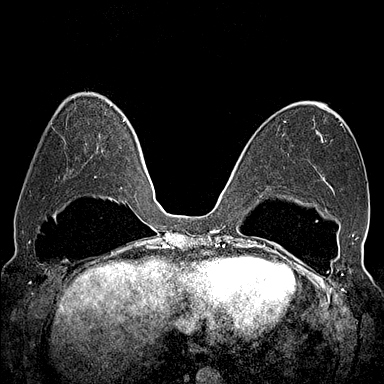
[im 72/144]
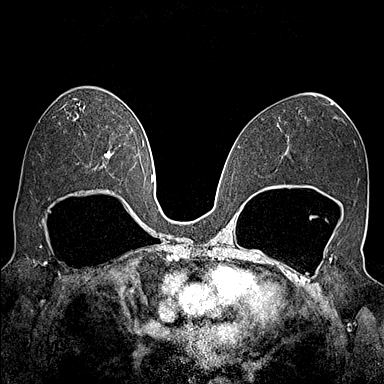
[im 108/144]
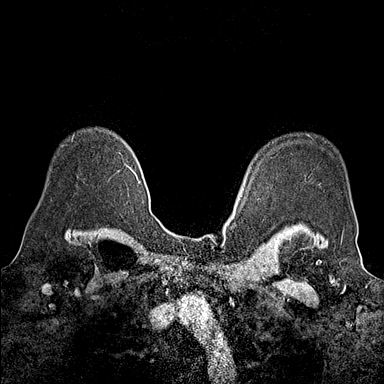
[im 144/144]
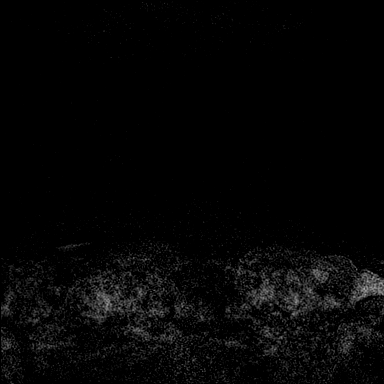

[Series 7: fl3d post-cm 20 · axial · 1.2mm · 0.94mm/px · z∈[-94,+77]mm · 5 of 144 slices shown (2 of 3)]
[im 1/144]
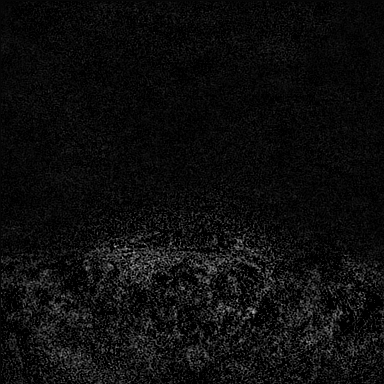
[im 36/144]
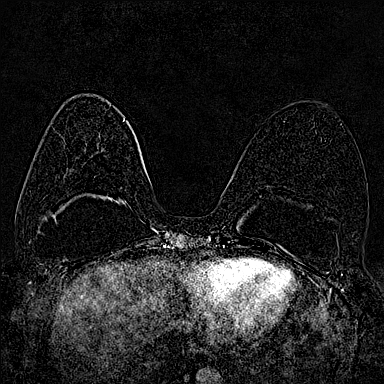
[im 72/144]
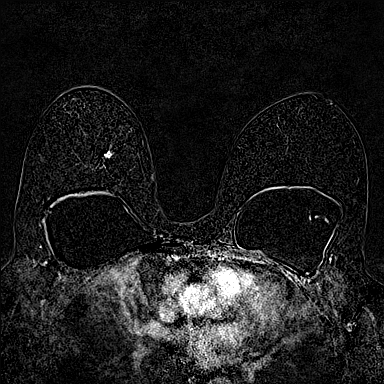
[im 108/144]
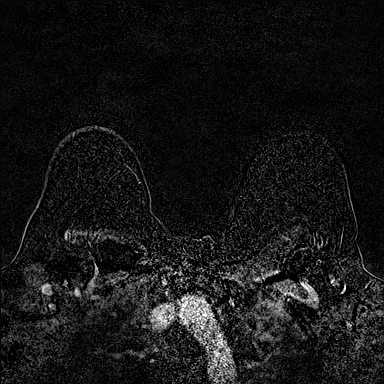
[im 144/144]
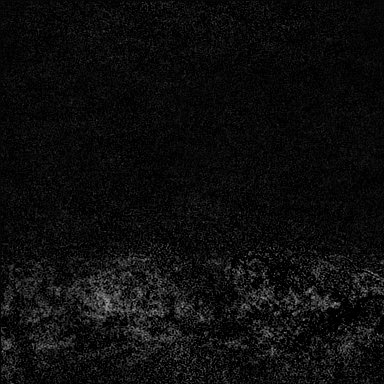

[Series 8: fl3d post-cm 20 · axial · 172.8mm · 0.94mm/px · 1 of 1 slices shown (3 of 3)]
[im 1/1]
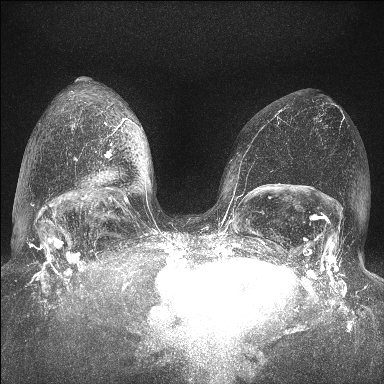

[Series 9: fl3d post-cm 3 · axial · 1.2mm · 0.94mm/px · z∈[-94,+77]mm · 5 of 144 slices shown (1 of 2)]
[im 1/144]
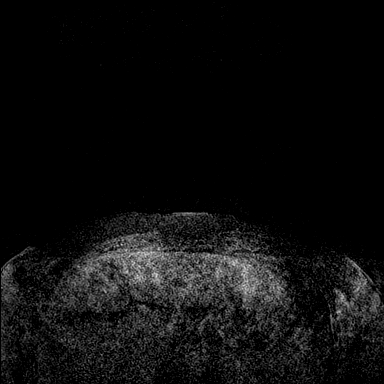
[im 36/144]
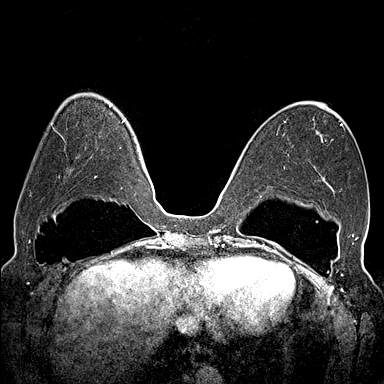
[im 72/144]
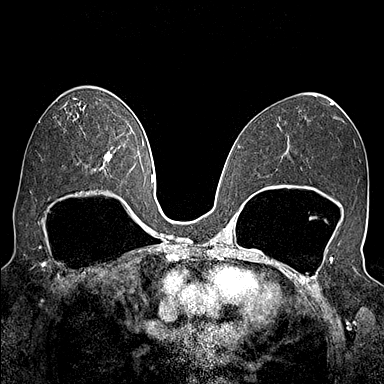
[im 108/144]
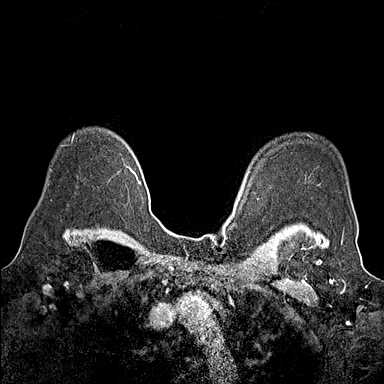
[im 144/144]
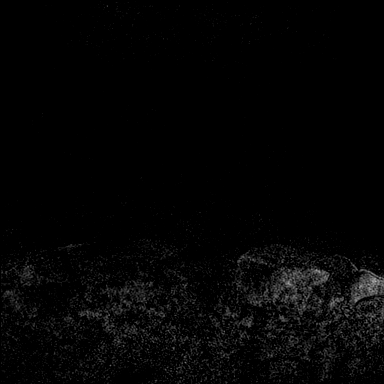

[Series 10: fl3d post-cm 3 · axial · 1.2mm · 0.94mm/px · z∈[-94,-61]mm · 2 of 144 slices shown (2 of 2)]
[im 1/144]
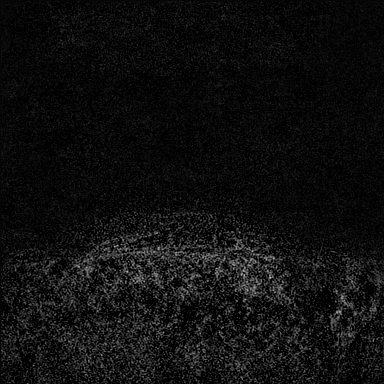
[im 29/144]
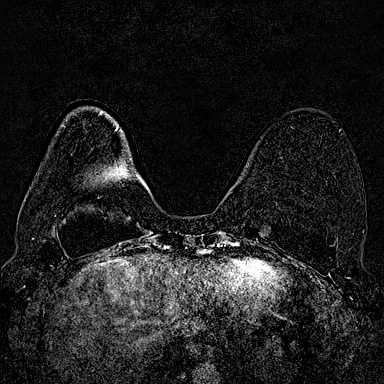

[29 of 48 positions shown; findings below may reference images not displayed]

Three-dimensional MR images were rendered by post-processing of the
original MR data on an independent workstation. The
three-dimensional MR images were interpreted, and findings are
reported in the following complete MRI report for this study. Three
dimensional images were evaluated at the independent interpreting
workstation using the DynaCAD thin client.
FINDINGS: Breast composition: b. Scattered fibroglandular tissue.

Background parenchymal enhancement: Minimal

Right breast: Within the UPPER INNER QUADRANT of the RIGHT breast,
there is an irregular enhancing mass just anterior to tissue marking
clip. Mass is 1.4 x 0.9 x 0.7 centimeters and demonstrates washout
enhancement kinetics. Findings are consistent with known malignancy
in this region. (Image 75 of series 6)

In the UPPER INNER QUADRANT of the RIGHT breast, there a T2 bright
oval circumscribed mass showing minimal enhancement and consistent
with mammographically stable lesion, likely fibroadenoma. Mass
measures 1.0 centimeters. (Image 54 series 12)

Patient has retropectoral silicone implant. Although the study was
not performed with silicone specific sequences, parallel line sign
and keyhole signs are consistent with intracapsular and
extracapsular rupture.

Left breast: Within the UPPER INNER QUADRANT, there is a
circumscribed oval T2 bright lesion with minimal enhancement
measuring 1.0 centimeters and demonstrating long-term mammographic
stability findings are most consistent with benign fibroadenoma.

In the LATERAL anterior portion of the LEFT breast, there is an oval
T1 and T2 bright lesion spanning 1.1 centimeters on image 93 of
series 5. Lesion shows long-term mammographic stability and is
consistent with fibroadenoma. No suspicious abnormalities identified
in the LEFT breast.

Patient has retropectoral silicone implant. Although not performed
with silicone specific sequences, heterogeneous intraluminal
contents and parallel line and keyhole signs are consistent with
intracapsular and extracapsular rupture.

Lymph nodes: Mildly prominent RIGHT axillary lymph nodes are
identified. These nodes follow signal intensity of silicone on all
sequences, raising the question of silicone laden lymph nodes. By
ultrasound, no morphologically abnormal lymph nodes were identified.

LEFT axillary lymph nodes show normal morphology with similar signal
characteristics compared to the RIGHT lymph nodes.

There are enlarged bilateral intramammary lymph nodes, measuring up
to 1.0 centimeters on the RIGHT and 0.7 centimeters on the LEFT.
Nodes are slightly asymmetric, RIGHT larger than LEFT.

Ancillary findings:  None.
IMPRESSION: 1. Known malignancy measuring 1.4 centimeters in the UPPER INNER
QUADRANT of the RIGHT breast with washout enhancement kinetics.
2. No other suspicious lesions in the RIGHT or LEFT breast.
3. Bilateral retropectoral silicone implants with intra and extra
capsular rupture.
4. Slightly prominent lymph nodes in the RIGHT axillary region are
favored to represent silicone laden lymph nodes rather than
metastatic disease, based on recent RIGHT axillary ultrasound.
5. Slightly prominent bilateral internal mammary lymph nodes may
represent silicone laden lymph nodes or metastatic disease.

RECOMMENDATION:
Recommend dedicated breast MRI with implant protocol to correlate
appearance of lymph nodes and to determine if these are more likely
metastatic disease or silicone laden lymph nodes.

BI-RADS CATEGORY  6: Known biopsy-proven malignancy.

## 2023-11-14 ENCOUNTER — Telehealth: Payer: Self-pay | Admitting: Plastic Surgery

## 2023-11-14 NOTE — Telephone Encounter (Signed)
 Pt has passed but pt husband called and ask if Dr Lowery could call him, he had some question about a sx for someone else, he stated that Dr Lowery did his wifes sx and only wants to speak with her.  [phone] this is is call back number  I dont know if this is even possible to talk with the husband about someone else, but he was insistent that I get a message to Dr Lowery.  Husband is listed in the DPR of pt chart  Im unsure how to go about this, please advise if possible
